# Patient Record
Sex: Male | Born: 1940 | Race: White | Hispanic: No | Marital: Married | Smoking: Former smoker
Health system: Southern US, Community
[De-identification: ages and names within clinical notes are randomized; demographics above are authoritative.]

## PROBLEM LIST (undated history)

## (undated) DIAGNOSIS — I499 Cardiac arrhythmia, unspecified: Secondary | ICD-10-CM

## (undated) DIAGNOSIS — R0609 Other forms of dyspnea: Secondary | ICD-10-CM

## (undated) DIAGNOSIS — J45909 Unspecified asthma, uncomplicated: Secondary | ICD-10-CM

## (undated) DIAGNOSIS — M1990 Unspecified osteoarthritis, unspecified site: Secondary | ICD-10-CM

## (undated) DIAGNOSIS — K922 Gastrointestinal hemorrhage, unspecified: Secondary | ICD-10-CM

## (undated) DIAGNOSIS — K760 Fatty (change of) liver, not elsewhere classified: Secondary | ICD-10-CM

## (undated) DIAGNOSIS — I10 Essential (primary) hypertension: Secondary | ICD-10-CM

## (undated) DIAGNOSIS — Z9289 Personal history of other medical treatment: Secondary | ICD-10-CM

## (undated) DIAGNOSIS — G4733 Obstructive sleep apnea (adult) (pediatric): Secondary | ICD-10-CM

## (undated) DIAGNOSIS — I4891 Unspecified atrial fibrillation: Secondary | ICD-10-CM

## (undated) DIAGNOSIS — R0600 Dyspnea, unspecified: Secondary | ICD-10-CM

## (undated) DIAGNOSIS — M109 Gout, unspecified: Secondary | ICD-10-CM

## (undated) DIAGNOSIS — E7800 Pure hypercholesterolemia, unspecified: Secondary | ICD-10-CM

## (undated) DIAGNOSIS — K219 Gastro-esophageal reflux disease without esophagitis: Secondary | ICD-10-CM

## (undated) DIAGNOSIS — I480 Paroxysmal atrial fibrillation: Secondary | ICD-10-CM

## (undated) DIAGNOSIS — R7303 Prediabetes: Secondary | ICD-10-CM

## (undated) DIAGNOSIS — C801 Malignant (primary) neoplasm, unspecified: Secondary | ICD-10-CM

## (undated) DIAGNOSIS — I509 Heart failure, unspecified: Secondary | ICD-10-CM

## (undated) HISTORY — PX: FINGER SURGERY: SHX640

## (undated) HISTORY — PX: EYE SURGERY: SHX253

## (undated) HISTORY — DX: Paroxysmal atrial fibrillation: I48.0

## (undated) HISTORY — DX: Other forms of dyspnea: R06.09

## (undated) HISTORY — DX: Obstructive sleep apnea (adult) (pediatric): G47.33

## (undated) HISTORY — DX: Unspecified atrial fibrillation: I48.91

## (undated) HISTORY — DX: Pure hypercholesterolemia, unspecified: E78.00

## (undated) HISTORY — PX: OTHER SURGICAL HISTORY: SHX169

## (undated) HISTORY — DX: Essential (primary) hypertension: I10

---

## 1999-02-08 ENCOUNTER — Other Ambulatory Visit: Admission: RE | Admit: 1999-02-08 | Discharge: 1999-02-08 | Payer: Self-pay | Admitting: Gastroenterology

## 1999-12-23 ENCOUNTER — Encounter: Admission: RE | Admit: 1999-12-23 | Discharge: 1999-12-23 | Payer: Self-pay | Admitting: *Deleted

## 1999-12-23 ENCOUNTER — Encounter: Payer: Self-pay | Admitting: *Deleted

## 2000-05-04 ENCOUNTER — Encounter: Admission: RE | Admit: 2000-05-04 | Discharge: 2000-05-04 | Payer: Self-pay | Admitting: Internal Medicine

## 2000-05-04 ENCOUNTER — Encounter: Payer: Self-pay | Admitting: Internal Medicine

## 2004-05-04 ENCOUNTER — Encounter (INDEPENDENT_AMBULATORY_CARE_PROVIDER_SITE_OTHER): Payer: Self-pay | Admitting: *Deleted

## 2004-05-04 ENCOUNTER — Ambulatory Visit (HOSPITAL_COMMUNITY): Admission: RE | Admit: 2004-05-04 | Discharge: 2004-05-04 | Payer: Self-pay | Admitting: Gastroenterology

## 2008-07-05 ENCOUNTER — Emergency Department (HOSPITAL_COMMUNITY): Admission: EM | Admit: 2008-07-05 | Discharge: 2008-07-05 | Payer: Self-pay | Admitting: Emergency Medicine

## 2008-09-09 ENCOUNTER — Inpatient Hospital Stay (HOSPITAL_COMMUNITY): Admission: RE | Admit: 2008-09-09 | Discharge: 2008-09-10 | Payer: Self-pay | Admitting: Urology

## 2008-09-09 ENCOUNTER — Encounter (INDEPENDENT_AMBULATORY_CARE_PROVIDER_SITE_OTHER): Payer: Self-pay | Admitting: Urology

## 2010-05-18 ENCOUNTER — Encounter: Admission: RE | Admit: 2010-05-18 | Discharge: 2010-05-18 | Payer: Self-pay | Admitting: Internal Medicine

## 2010-10-20 LAB — CBC
HCT: 42 % (ref 39.0–52.0)
HCT: 42.1 % (ref 39.0–52.0)
Hemoglobin: 14.3 g/dL (ref 13.0–17.0)
MCHC: 33.9 g/dL (ref 30.0–36.0)
MCHC: 33.9 g/dL (ref 30.0–36.0)
MCV: 91 fL (ref 78.0–100.0)
MCV: 91.5 fL (ref 78.0–100.0)
MCV: 91.7 fL (ref 78.0–100.0)
Platelets: 196 10*3/uL (ref 150–400)
Platelets: 213 10*3/uL (ref 150–400)
RDW: 13.1 % (ref 11.5–15.5)
WBC: 13 10*3/uL — ABNORMAL HIGH (ref 4.0–10.5)
WBC: 6.7 10*3/uL (ref 4.0–10.5)

## 2010-10-20 LAB — PROTIME-INR: INR: 1 (ref 0.00–1.49)

## 2010-10-20 LAB — DIFFERENTIAL
Basophils Absolute: 0 10*3/uL (ref 0.0–0.1)
Basophils Relative: 0 % (ref 0–1)
Basophils Relative: 0 % (ref 0–1)
Eosinophils Absolute: 0 10*3/uL (ref 0.0–0.7)
Eosinophils Absolute: 0 10*3/uL (ref 0.0–0.7)
Eosinophils Relative: 0 % (ref 0–5)
Lymphocytes Relative: 6 % — ABNORMAL LOW (ref 12–46)
Lymphs Abs: 0.5 10*3/uL — ABNORMAL LOW (ref 0.7–4.0)
Monocytes Absolute: 0.2 10*3/uL (ref 0.1–1.0)
Neutrophils Relative %: 88 % — ABNORMAL HIGH (ref 43–77)

## 2010-10-20 LAB — COMPREHENSIVE METABOLIC PANEL
Albumin: 3.6 g/dL (ref 3.5–5.2)
BUN: 17 mg/dL (ref 6–23)
Calcium: 9.2 mg/dL (ref 8.4–10.5)
Chloride: 109 mEq/L (ref 96–112)
Creatinine, Ser: 1.01 mg/dL (ref 0.4–1.5)
GFR calc non Af Amer: >60 mL/min (ref >60)
Total Bilirubin: 0.6 mg/dL (ref 0.3–1.2)

## 2010-10-20 LAB — BASIC METABOLIC PANEL
BUN: 12 mg/dL (ref 6–23)
CO2: 28 mEq/L (ref 19–32)
Chloride: 103 mEq/L (ref 96–112)
Chloride: 105 mEq/L (ref 96–112)
Creatinine, Ser: 0.93 mg/dL (ref 0.4–1.5)
Glucose, Bld: 156 mg/dL — ABNORMAL HIGH (ref 70–99)
Potassium: 4.6 mEq/L (ref 3.5–5.1)
Sodium: 139 mEq/L (ref 135–145)

## 2010-10-20 LAB — TYPE AND SCREEN: ABO/RH(D): O POS

## 2010-10-20 LAB — URINALYSIS, ROUTINE W REFLEX MICROSCOPIC
Bilirubin Urine: NEGATIVE
Glucose, UA: NEGATIVE mg/dL
Ketones, ur: NEGATIVE mg/dL
pH: 6 (ref 5.0–8.0)

## 2010-10-20 LAB — APTT: aPTT: 26 seconds (ref 24–37)

## 2010-11-22 NOTE — Op Note (Signed)
NAMEJAMAURIE, Darren [name]                ACCOUNT NO.:  [id number]   MEDICAL RECORD NO.:  [id number]          PATIENT TYPE:  INP   LOCATION:  0003                         FACILITY:  [facility]   PHYSICIAN:  Lucrezia Starch. Earlene [name], M.D.  DATE OF BIRTH:  10/24/1940   DATE OF PROCEDURE:  09/09/2008  DATE OF DISCHARGE:                               OPERATIVE REPORT   PREOPERATIVE DIAGNOSIS:  Adenocarcinoma of prostate.   OPERATIVE PROCEDURE:  Robotic-assisted laparoscopic radical  prostatectomy.   SURGEON:  Lucrezia Starch. Earlene [name], M.D.   ASSISTANT:  Delia Chimes, NP.   ANESTHESIA:  General endotracheal.   ESTIMATED BLOOD LOSS:  250 mL.   TUBES/:  A 20-French Foley catheter and large round Blake drain.   COMPLICATIONS:  None.   INDICATIONS FOR PROCEDURE:  Darren [name] is a very nice 70 year old white  male whose had an elevated PSA eventually to 9.21 with a free to total  ratio of 10.2%.  He subsequently underwent transrectal ultrasound of the  prostate and biopsy.  The biopsy revealed three cores of Gleason score  6, 3 + 3 adenocarcinoma from the right side and one from the left mid  side.  He has considered all options and after understanding risks,  benefits and alternatives he has elected to undergo the above procedure.   PROCEDURE IN DETAIL:  The patient was placed in supine position.  After  proper general endotracheal anesthesia he was placed in exaggerated  lithotomy position and prepped and draped with Betadine in a sterile  fashion.  A periumbilical incision was made at the proper level and  dissection was carried down to the peritoneal cavity.  A long 12 mm  camera port was placed and the abdomen was insufflated with carbon  dioxide.  Inspection of the abdomen revealed somewhat obese, but no  particular abnormalities were noted.  Under direct vision, the right and  left robotic arm ports were placed as was the fourth arm port in the  left lateral abdomen.  Also, under direct vision a 12 mm and a  5 mm  working port were placed on the right side of the abdomen.  The robot  was docked and dissection was began after the bladder had been  insufflated with 200 mL of normal saline.  The medial umbilical  ligaments were taken down.  The space of Retzius was entered.  The  bladder was dissected posteriorly.  The endopelvic fascia was taken down  bilaterally after the bladder been drained and the deep dorsal vein  complex was clipped and cut with the endovascular stapler.  The  puboprostatic ligaments were essentially intact.  Following this, the  bladder neck was approached after the prostate had been defatted.  An  amp of indigo carmine was given IV and the bladder neck was taken down  from the prostate.  There was a significant median bar present which we  had seen on ultrasound.  This was taken down posteriorly and included in  the specimen.  The seminal vesicles were dissected out in their  entirety.  The ampulla vas deferens were incised and  Denonvilliers  fascia was taken down to the apex of the prostate.  A bilateral nerve  spare was then performed under cold knife.  The right and left  neurovascular bundles were taken down posterior laterally and the  pedicles were taken in serial packets with Hem-o-lok clips and cut and  then dissection was carried down to the apex.  The apex was then taken  off of the urethra sharply.  The specimen was placed in the right lower  quadrant.  Good hemostasis noted to be present.  The rectum was totally  intact.  Thorough irrigation was performed.  Te bladder neck was  somewhat widened due to the median bar.  Therefore, a bladder neck  closure was performed with 3-0 Vicryl sutures placed on the right and  left side.  Again, indigo carmine was given IV and good blue dye was  noted to be effluxing.  The bladder neck was then approached,  approximated to the urethra under direct vision.  A holding stitch with  2-0 Vicryl suture was placed at the 6  o'clock position and the  anastomosis was accomplished with 3-0 Monocryl dyed and undyed sutures  tied posteriorly under the bladder.  Following this, a 20-French coude  catheter was placed in the bladder inflated and irrigated and there was  no leakage noted and blue dye was noted to be irrigating well.  Thorough  irrigation was performed.  Good hemostasis noted to be present.  We felt  that a node dissection was not indicated.  We had seen no obvious  lymphadenopathy with a low grade tumor.  We felt comfortable with that.  A large round Blake drain was placed through the fourth arm port and  sutured in place with nylon.  The specimen was removed from the  periumbilical port after the 12 mm right working port had been closed  under direct vision with  2-0 Vicryl suture and a suture passer.  All  ports were removed under direct vision.  The specimen was removed and  the fascia of the periumbilical port was closed under direct vision with  2-0 Vicryl suture.  Irrigation was performed.  Good hemostasis noted to  be present.  Each wound was injected 0.25% Marcaine and closed with skin  staples.  The patient tolerated the procedure well.  Specimen was  submitted to pathology.      Darren [name], M.D.  Electronically Signed     RLD/MEDQ  D:  09/09/2008  T:  09/09/2008  Job:  [id number]

## 2010-11-22 NOTE — Discharge Summary (Signed)
NAMEJAYE, POLIDORI                ACCOUNT NO.:  [id number]   MEDICAL RECORD NO.:  [id number]          PATIENT TYPE:  INP   LOCATION:  1432                         FACILITY:  [facility]   PHYSICIAN:  Lucrezia Starch. Earlene Plater, M.D.  DATE OF BIRTH:  1940-09-11   DATE OF ADMISSION:  09/09/2008  DATE OF DISCHARGE:  09/10/2008                               DISCHARGE SUMMARY   ADMISSION DIAGNOSIS:  Adenocarcinoma of the prostate.   DISCHARGE DIAGNOSIS:  Adenocarcinoma of the prostate.   PROCEDURES:  Robotic-assisted laparoscopic radical prostatectomy  (bilateral nerve sparing).   HISTORY AND PHYSICAL:  For full details, please see admission history  and physical.  Briefly, Mr. Holter monitor is a 70 year old gentleman  who was found to have clinically localized  adenocarcinoma of the  prostate.  After careful consideration regarding management options for  treatment, he elected to proceed with surgical therapy and a robotic-  assisted laparoscopic radial prostatectomy.   HOSPITAL COURSE:  On September 09, 2008, he was taken to the operating room  where he underwent the above-named procedure, which he tolerated well  without complications.  Postoperatively, he was able to be transferred  to a regular hospital room following recovery from anesthesia.  He was  able to begin ambulation that evening.  He remained hemodynamically  stable.  His postoperative hematocrit was 42.1.  He voiced no complaints  that evening, except for the fact that his right eye was red and swollen  and felt as if he had a foreign object.  That eye was inspected, and no  foreign objects were observed.  Therefore, an ophthalmology consultation  was called in.  Dr. Burgess Estelle, ophthalmologist, did observe a corneal  abrasion of the right eye and treated it with antibiotic drops and  ointment.   On the morning of postoperative day #1, his eye looked much improved  with less redness and less swelling.  He also denied any pain at that  time.  He hematocrit was also found to be stable on postoperative day #1  at 36.9.  He maintained a slight urine output with minimal output from  his pelvic drain; therefore, the pelvic drain was removed.  He was  placed on a clear liquid diet and continued to ambulate.  He was  reevaluated on the evening of postoperative day #1.  His urine output,  blood pressure and pulse all remained stable.  He was able to tolerate  his clear liquid diet.  Therefore, it was felt that he was stable for  discharge, as he had met all discharge criteria.   DISPOSITION:  Home.   DISCHARGE MEDICATIONS:  He was instructed to resume his regular home  medications consisting of:  1. Hyzaar.  2. Lipitor.  3. Omeprazole.  4. He was instructed to hold his aspirin for approximately 7-10 days      postoperatively.  5. In addition, he was provided a prescription for Vicodin to take as      needed for pain.  6. Told to use Colace as a stool softener.  7. He was also provided a prescription for an  antibiotic to use for 7      days until his postoperative appointment.   DISCHARGE INSTRUCTIONS:  He was instructed to be ambulatory, but  specifically told to refrain from any heavy lifting, strenuous activity  or driving.  He was instructed on routine Foley catheter care and told  to gradually advance his diet over the course of the next few days.   FOLLOWUP:  He will follow up in 1 week for a Foley catheter and staple  removal.      Delia Chimes, NP      Lucrezia Starch. Earlene Plater, M.D.  Electronically Signed    MA/MEDQ  D:  09/10/2008  T:  09/10/2008  Job:  [id number]

## 2010-11-25 NOTE — Op Note (Signed)
NAMEPOLK, Darren [name]                ACCOUNT NO.:  [id number]   MEDICAL RECORD NO.:  [id number]          PATIENT TYPE:  AMB   LOCATION:  ENDO                         FACILITY:  [facility]   PHYSICIAN:  Bernette Redbird, M.D.   DATE OF BIRTH:  1940-09-26   DATE OF PROCEDURE:  05/04/2004  DATE OF DISCHARGE:                                 OPERATIVE REPORT   PROCEDURE PERFORMED:  Colonoscopy with polypectomy and biopsy.   ENDOSCOPIST:  Florencia Reasons, M.D.   INDICATIONS FOR PROCEDURE:  The patient is a 70 year old gentleman with need  for colon cancer screening.  Family history of colon polyps in both parents.   FINDINGS:  Two small polyps removed.   DESCRIPTION OF PROCEDURE:  The nature, purpose and risks of the procedure  had been discussed with the patient, who provided written consent.  Sedation  was fentanyl 25 mcg and Versed 2.5 mg IV, intentionally very light sedation  at the patient's request so that he could be awake and watch the procedure.  Digital exam of the prostate was unremarkable.   The Olympus adult video colonoscope was readily advanced to the terminal  ileum and pullback was then performed.  The quality of the prep was very  good with a little bit of bile coating the proximal colon, much of which was  irrigated off, so it is felt that no significant lesions would been missed.   In the proximal colon, I saw a solitary diverticulum.  There was a small  sessile polyp in the transverse colon removed by cold biopsy technique and a  slightly larger 4 mm sessile polyp removed by hot snare technique in the  sigmoid region.  No large polyps, cancer, colitis, vascular malformations or  extensive diverticular change were noted.  Retroflexion in the rectum was  unremarkable as was reinspection of the rectum.  The patient tolerated the  procedure well and there were no apparent complications.   IMPRESSION:  1.  Colon polyps removed as described above (2113).  2.  Minimal right  side diverticulosis.  3.  Family history of colon polyps.   PLAN:  Follow-up colonoscopy in three to five years.       RB/MEDQ  D:  05/04/2004  T:  05/04/2004  Job:  [id number]   cc:   Wilson Singer, M.D.  [address], [address]  [place]  [place] [address]  Fax: [phone]

## 2011-04-14 LAB — URINE CULTURE: Colony Count: >=100000

## 2011-04-14 LAB — COMPREHENSIVE METABOLIC PANEL
ALT: 58 U/L — ABNORMAL HIGH (ref 0–53)
AST: 67 U/L — ABNORMAL HIGH (ref 0–37)
Alkaline Phosphatase: 108 U/L (ref 39–117)
CO2: 27 mEq/L (ref 19–32)
Chloride: 98 mEq/L (ref 96–112)
GFR calc Af Amer: >60 mL/min (ref >60)
GFR calc non Af Amer: 51 mL/min — ABNORMAL LOW (ref >60)
Potassium: 3.4 mEq/L — ABNORMAL LOW (ref 3.5–5.1)
Sodium: 135 mEq/L (ref 135–145)
Total Bilirubin: 0.8 mg/dL (ref 0.3–1.2)

## 2011-04-14 LAB — CBC
MCV: 92.3 fL (ref 78.0–100.0)
RBC: 4.8 MIL/uL (ref 4.22–5.81)
WBC: 11.6 10*3/uL — ABNORMAL HIGH (ref 4.0–10.5)

## 2011-04-14 LAB — URINALYSIS, ROUTINE W REFLEX MICROSCOPIC
Bilirubin Urine: NEGATIVE
Ketones, ur: NEGATIVE mg/dL
Nitrite: NEGATIVE
Urobilinogen, UA: 0.2 mg/dL (ref 0.0–1.0)
pH: 6 (ref 5.0–8.0)

## 2011-04-14 LAB — DIFFERENTIAL
Basophils Absolute: 0.1 10*3/uL (ref 0.0–0.1)
Eosinophils Absolute: 0.1 10*3/uL (ref 0.0–0.7)
Eosinophils Relative: 1 % (ref 0–5)

## 2011-04-14 LAB — URINE MICROSCOPIC-ADD ON

## 2011-04-14 LAB — SEDIMENTATION RATE: Sed Rate: 113 mm/hr — ABNORMAL HIGH (ref 0–16)

## 2011-08-16 ENCOUNTER — Other Ambulatory Visit: Payer: Self-pay | Admitting: Physician Assistant

## 2011-08-16 DIAGNOSIS — D229 Melanocytic nevi, unspecified: Secondary | ICD-10-CM

## 2011-08-16 HISTORY — DX: Melanocytic nevi, unspecified: D22.9

## 2011-09-14 ENCOUNTER — Other Ambulatory Visit: Payer: Self-pay | Admitting: Physician Assistant

## 2012-12-18 ENCOUNTER — Other Ambulatory Visit: Payer: Self-pay | Admitting: Physician Assistant

## 2013-08-04 ENCOUNTER — Ambulatory Visit
Admission: RE | Admit: 2013-08-04 | Discharge: 2013-08-04 | Disposition: A | Discharge status: Home / Self Care | Payer: Medicare Other | Source: Ambulatory Visit | Attending: Internal Medicine | Admitting: Internal Medicine

## 2013-08-04 ENCOUNTER — Other Ambulatory Visit: Payer: Self-pay | Admitting: Internal Medicine

## 2013-08-04 DIAGNOSIS — R05 Cough: Secondary | ICD-10-CM

## 2013-08-04 DIAGNOSIS — R059 Cough, unspecified: Secondary | ICD-10-CM

## 2015-07-15 ENCOUNTER — Other Ambulatory Visit: Payer: Self-pay | Admitting: Gastroenterology

## 2016-03-30 ENCOUNTER — Encounter: Payer: Self-pay | Admitting: Internal Medicine

## 2016-03-30 ENCOUNTER — Ambulatory Visit (INDEPENDENT_AMBULATORY_CARE_PROVIDER_SITE_OTHER)
Admission: RE | Admit: 2016-03-30 | Discharge: 2016-03-30 | Disposition: A | Discharge status: Home / Self Care | Payer: Medicare Other | Source: Ambulatory Visit | Attending: Internal Medicine | Admitting: Internal Medicine

## 2016-03-30 ENCOUNTER — Encounter (INDEPENDENT_AMBULATORY_CARE_PROVIDER_SITE_OTHER): Payer: Self-pay

## 2016-03-30 ENCOUNTER — Ambulatory Visit (INDEPENDENT_AMBULATORY_CARE_PROVIDER_SITE_OTHER): Payer: Medicare Other | Admitting: Internal Medicine

## 2016-03-30 VITALS — BP 162/80 | HR 73 | Ht 71.0 in | Wt 261.0 lb

## 2016-03-30 DIAGNOSIS — J45991 Cough variant asthma: Secondary | ICD-10-CM | POA: Diagnosis not present

## 2016-03-30 DIAGNOSIS — J449 Chronic obstructive pulmonary disease, unspecified: Secondary | ICD-10-CM | POA: Diagnosis not present

## 2016-03-30 DIAGNOSIS — E669 Obesity, unspecified: Secondary | ICD-10-CM | POA: Insufficient documentation

## 2016-03-30 LAB — NITRIC OXIDE: Nitric Oxide: 47

## 2016-03-30 MED ORDER — BUDESONIDE-FORMOTEROL FUMARATE 80-4.5 MCG/ACT IN AERO: 2.0000 | INHALATION_SPRAY | Freq: Two times a day (BID) | RESPIRATORY_TRACT | 11 refills | Status: DC

## 2016-03-30 MED ORDER — BUDESONIDE-FORMOTEROL FUMARATE 80-4.5 MCG/ACT IN AERO: 2.0000 | INHALATION_SPRAY | Freq: Two times a day (BID) | RESPIRATORY_TRACT | 0 refills | Status: DC

## 2016-03-30 NOTE — Patient Instructions (Signed)
Plan A = Automatic = Symbicort 80 Take 2 puffs first thing in am and then another 2 puffs about 12 hours later.   Plan B = Backup Only use your albuterol as a rescue medication to be used if you can't catch your breath by resting or doing a relaxed purse lip breathing pattern.  - The less you use it, the better it will work when you need it. - Ok to use the inhaler up to 2 puffs  every 4 hours if you must but call for appointment if use goes up over your usual need - Don't leave home without it !!  (think of it like the spare tire for your car)    Please remember to go to the  x-ray department downstairs for your tests - we will call you with the results when they are available.   Please schedule a follow up office visit in 6 weeks, call sooner if needed

## 2016-03-30 NOTE — Progress Notes (Signed)
   Subjective:    Patient ID: Darren [name], male    DOB: 1940/08/20,   MRN: [id number]  HPI  89 yowm quit smoking 1990 with cough that resolved and pattern of recurrent rhinitis since childhood spring > fall with onset of recurrent flares of cough / wheezing around 2005 resolves from days to weeks p prednisone cycle referred to pulmonary clinic 03/30/2016 by Dr   Dr Mellody Drown    03/30/2016 1st [facility] office visit/ Kissie Ziolkowski   Chief Complaint  Patient presents with  . Pulmonary Consult    Referred by Dr. [name]. Pt c/o cough and wheezing off and on "for years".    last prednisone was 15 days one week prior to OV  Back to baseline = doe x MMRC1 = can walk nl pace, flat grade, can't hurry or go uphills or steps s sob  On proair once each am   No obvious day to day or daytime variability or assoc excess/ purulent sputum or mucus plugs or hemoptysis or cp or chest tightness, subjective wheeze or overt sinus or hb symptoms. No unusual exp hx or h/o childhood pna/ asthma or knowledge of premature birth.  Sleeping ok without nocturnal  or early am exacerbation  of respiratory  c/o's or need for noct saba. Also denies any obvious fluctuation of symptoms with weather or environmental changes or other aggravating or alleviating factors except as outlined above   Current Medications, Allergies, Complete Past Medical History, Past Surgical History, Family History, and Social History were reviewed in [employer] record.         Review of Systems  Constitutional: Negative for activity change, appetite change, chills, fever and unexpected weight change.  HENT: Positive for sneezing. Negative for congestion, dental problem, postnasal drip, rhinorrhea, sore throat, trouble swallowing and voice change.   Eyes: Negative for visual disturbance.  Respiratory: Positive for cough. Negative for choking and shortness of breath.   Cardiovascular: Negative for chest pain and leg  swelling.  Gastrointestinal: Negative for abdominal pain, nausea and vomiting.  Genitourinary: Negative for difficulty urinating.  Musculoskeletal: Negative for arthralgias.  Skin: Negative for rash.  Psychiatric/Behavioral: Negative for behavioral problems and confusion.       Objective:   Physical Exam  amb obese wm nad  Wt Readings from Last 3 Encounters:  03/30/16 261 lb (118.4 kg)    Vital signs reviewed - sats 97% on arrival RA    HEENT: nl dentition, turbinates, and oropharynx. Nl external ear canals without cough reflex   NECK :  without JVD/Nodes/TM/ nl carotid upstrokes bilaterally   LUNGS: no acc muscle use,  Nl contour chest with distant mid to late exp wheezes bilaterally   CV:  RRR  no s3 or murmur or increase in P2, no edema   ABD:  Quite obese but soft and nontender with nl inspiratory excursion in the supine position. No bruits or organomegaly, bowel sounds nl  MS:  Nl gait/ ext warm without deformities, calf tenderness, cyanosis or clubbing No obvious joint restrictions   SKIN: warm and dry without lesions    NEURO:  alert, approp, nl sensorium with  no motor deficits     CXR PA and Lateral:   03/30/2016 :    I personally reviewed images and agree with radiology impression as follows:    Slight scarring left base. No edema or consolidation. Apparent nipple shadow right base, stable.      Assessment & Plan:

## 2016-03-30 NOTE — Assessment & Plan Note (Signed)
Quit smoking 1990 Spirometry 03/30/2016  FEV1 1.47 (45%)  Ratio 62    Informed the patient there was  no medication on the market that has proven to alter the curve/ its downward trajectory  or the likelihood of progression of their disease(unlike other chronic medical conditions such as atheroclerosis where we do think we can change the natural hx with risk reducing meds)    Therefore focus is on treating symtpoms and  and maintaining abstinence , preventing exacerbations >  These are most important aspect of care, not choice of inhalers, but that we would use more aggressive rx if needed to reduce the flares and in meantime encouraged hi m to be as active as possible and try to get wt heading in the right direction (see separate a/p)

## 2016-03-30 NOTE — Assessment & Plan Note (Signed)
.  Body mass index is 36.4 kg/m.  No results found for: TSH   Contributing to gerd tendency/ doe/reviewed the need and the process to achieve and maintain neg calorie balance > defer f/u primary care including intermittently monitoring thyroid status

## 2016-03-30 NOTE — Assessment & Plan Note (Signed)
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 and don't need big gun therapy for the copd at this point   Instead rec low dose symbicort ie 80 2 bid for now until returns to see if the pattern of cough can be interupted or whether the higher doses or alternative therapy should be considered eg allergy testing/ singulair trial  Total time devoted to counseling  = 35/58m review case with pt/ discussion of options/alternatives/ personally creating written instructions  in presence of pt  then going over those specific  Instructions directly with the pt including how to use all of the meds but in particular covering each new medication in detail and the difference between the maintenance/automatic meds and the prns using an action plan format for the latter.

## 2016-03-31 NOTE — Progress Notes (Signed)
LMTCB

## 2016-03-31 NOTE — Progress Notes (Signed)
Pt returned call Advised of cxr results as stated by MW

## 2016-04-07 ENCOUNTER — Telehealth: Payer: Self-pay | Admitting: Internal Medicine

## 2016-04-07 MED ORDER — FLUTICASONE-SALMETEROL 115-21 MCG/ACT IN AERO: 2.0000 | INHALATION_SPRAY | Freq: Two times a day (BID) | RESPIRATORY_TRACT | 12 refills | Status: DC

## 2016-04-07 NOTE — Telephone Encounter (Signed)
Called spoke with pt. Aware of the change. Rx sent in. Nothing further needed

## 2016-04-07 NOTE — Telephone Encounter (Signed)
Pt returning call.Darren [name] ° °

## 2016-04-07 NOTE — Telephone Encounter (Signed)
Received letter from pharm stating that Symbicort is not covered by ins  They prefer Advair and MW states okay to call in Advair HFA 115 2 puffs bid  Called to inform the pt and had to [facility]

## 2016-05-10 ENCOUNTER — Other Ambulatory Visit (INDEPENDENT_AMBULATORY_CARE_PROVIDER_SITE_OTHER): Payer: Medicare Other

## 2016-05-10 ENCOUNTER — Ambulatory Visit (INDEPENDENT_AMBULATORY_CARE_PROVIDER_SITE_OTHER): Payer: Medicare Other | Admitting: Internal Medicine

## 2016-05-10 ENCOUNTER — Encounter: Payer: Self-pay | Admitting: Internal Medicine

## 2016-05-10 VITALS — BP 134/82 | HR 80 | Ht 71.0 in | Wt 264.0 lb

## 2016-05-10 DIAGNOSIS — J449 Chronic obstructive pulmonary disease, unspecified: Secondary | ICD-10-CM

## 2016-05-10 DIAGNOSIS — J45991 Cough variant asthma: Secondary | ICD-10-CM | POA: Diagnosis not present

## 2016-05-10 LAB — CBC WITH DIFFERENTIAL/PLATELET
BASOS PCT: 0.8 % (ref 0.0–3.0)
Basophils Absolute: 0.1 10*3/uL (ref 0.0–0.1)
EOS PCT: 5.2 % — AB (ref 0.0–5.0)
Eosinophils Absolute: 0.4 10*3/uL (ref 0.0–0.7)
HCT: 40.9 % (ref 39.0–52.0)
Hemoglobin: 13.8 g/dL (ref 13.0–17.0)
LYMPHS ABS: 1.6 10*3/uL (ref 0.7–4.0)
Lymphocytes Relative: 22.6 % (ref 12.0–46.0)
MCHC: 33.9 g/dL (ref 30.0–36.0)
MCV: 95.9 fl (ref 78.0–100.0)
MONO ABS: 0.6 10*3/uL (ref 0.1–1.0)
Monocytes Relative: 8.6 % (ref 3.0–12.0)
NEUTROS PCT: 62.8 % (ref 43.0–77.0)
Neutro Abs: 4.4 10*3/uL (ref 1.4–7.7)
Platelets: 187 10*3/uL (ref 150.0–400.0)
RBC: 4.26 Mil/uL (ref 4.22–5.81)
RDW: 13.9 % (ref 11.5–15.5)
WBC: 6.9 10*3/uL (ref 4.0–10.5)

## 2016-05-10 MED ORDER — BUDESONIDE-FORMOTEROL FUMARATE 80-4.5 MCG/ACT IN AERO: 2.0000 | INHALATION_SPRAY | Freq: Two times a day (BID) | RESPIRATORY_TRACT | 1 refills | Status: DC

## 2016-05-10 MED ORDER — PREDNISONE 10 MG PO TABS: ORAL_TABLET | ORAL | 0 refills | Status: DC

## 2016-05-10 NOTE — Progress Notes (Signed)
Subjective:    Patient ID: ESIAH COFFELL, male    DOB: 12-16-1940,   MRN: [id number]    Brief patient profile:  24 yowm quit smoking 1990 with cough that resolved and pattern of recurrent rhinitis since childhood spring > fall with onset of recurrent flares of cough / wheezing around 2005 resolves from days to weeks p prednisone cycle referred to pulmonary clinic 03/30/2016 by Dr   Dr Mellody Drown    History of Present Illness  03/30/2016 1st [facility] office visit/ Casson Catena   Chief Complaint  Patient presents with  . Pulmonary Consult    Referred by Dr. [name]. Pt c/o cough and wheezing off and on "for years".    last prednisone was 15 days one week prior to OV  Back to baseline = doe x MMRC1 = can walk nl pace, flat grade, can't hurry or go uphills or steps s sob  On proair once each am  rec Plan A = Automatic = Symbicort 80 Take 2 puffs first thing in am and then another 2 puffs about 12 hours later.  Plan B = Backup Only use your albuterol as a rescue medication  Please remember to go to the  x-ray department downstairs for your tests - we will call you with the results when they are available.   04/07/16 insurance req change to advair 115    05/10/2016  f/u ov/Limuel Nieblas re: cough flare on advair 115 hfa 2 bid  Chief Complaint  Patient presents with  . Follow-up    Cough had improved but then worsened again 2 wks ago. He is no longer wheezing. Cough is rarely prod, but in the am's he coughs up brown sputum.    better for at least for a few weeks then gradually worse ? Due to change to advair ? Due to flare of fall rhinitis  Doe continues MMRC1  No obvious day to day or daytime variability or assoc   mucus plugs or hemoptysis or cp or chest tightness, subjective wheeze or overt sinus or hb symptoms. No unusual exp hx or h/o childhood pna/ asthma or knowledge of premature birth.  Sleeping ok without nocturnal  or early am exacerbation  of respiratory  c/o's or need for noct  saba. Also denies any obvious fluctuation of symptoms with weather or environmental changes or other aggravating or alleviating factors except as outlined above   Current Medications, Allergies, Complete Past Medical History, Past Surgical History, Family History, and Social History were reviewed in [employer] record.  ROS  The following are not active complaints unless bolded sore throat, dysphagia, dental problems, itching, sneezing,  nasal congestion or excess/ purulent secretions, ear ache,   fever, chills, sweats, unintended wt loss, classically pleuritic or exertional cp,  orthopnea pnd or leg swelling, presyncope, palpitations, abdominal pain, anorexia, nausea, vomiting, diarrhea  or change in bowel or bladder habits, change in stools or urine, dysuria,hematuria,  rash, arthralgias, visual complaints, headache, numbness, weakness or ataxia or problems with walking or coordination,  change in mood/affect or memory.             Objective:   Physical Exam  amb obese wm nad   Wt Readings from Last 3 Encounters:  05/10/16 264 lb (119.7 kg)  03/30/16 261 lb (118.4 kg)    Vital signs reviewed - Note on arrival 02 sats  96% on RA       HEENT: nl dentition, turbinates, and oropharynx. Nl external ear  canals without cough reflex   NECK :  without JVD/Nodes/TM/ nl carotid upstrokes bilaterally   LUNGS: no acc muscle use,  Nl contour chest with    CV:  RRR  no s3 or murmur or increase in P2, no edema   ABD:  Quite obese but soft and nontender with nl inspiratory excursion in the supine position. No bruits or organomegaly, bowel sounds nl  MS:  Nl gait/ ext warm without deformities, calf tenderness, cyanosis or clubbing No obvious joint restrictions   SKIN: warm and dry without lesions    NEURO:  alert, approp, nl sensorium with  no motor deficits     CXR PA and Lateral:   03/30/2016 :    I personally reviewed images and agree with radiology impression  as follows:    Slight scarring left base. No edema or consolidation. Apparent nipple shadow right base, stable.  Labs ordered 05/10/2016  Allergy profile       Assessment & Plan:

## 2016-05-10 NOTE — Patient Instructions (Addendum)
Stop advair  Plan A = Automatic = symbicort 80 Take 2 puffs first thing in am and then another 2 puffs about 12 hours later.    Work on [employer]  inhaler technique:  relax and gently blow all the way out then take a nice smooth deep breath back in, triggering the inhaler at same time you start breathing in.  Hold for up to 5 seconds if you can. Blow out thru nose. Rinse and gargle with water when done  Prednisone 10 mg take  4 each am x 2 days,   2 each am x 2 days,  1 each am x 2 days and stop      Plan B = Backup Only use your albuterol as a rescue medication to be used if you can't catch your breath by resting or doing a relaxed purse lip breathing pattern.  - The less you use it, the better it will work when you need it. - Ok to use the inhaler up to 2 puffs  every 4 hours if you must but call for appointment if use goes up over your usual need - Don't leave home without it !!  (think of it like the spare tire for your car)    Please remember to go to the lab department downstairs for your tests - we will call you with the results when they are available.     Please schedule a follow up office visit in 4 weeks, sooner if needed  Late add next step sinus CT / has 4 weeks symb samples to sort out whether sym 80 works vs advair 115

## 2016-05-11 ENCOUNTER — Ambulatory Visit: Payer: Medicare Other | Admitting: Internal Medicine

## 2016-05-11 LAB — RESPIRATORY ALLERGY PROFILE REGION II ~~LOC~~
ALLERGEN, CEDAR TREE, T6: 0.12 kU/L — AB
Allergen, C. Herbarum, M2: <0.1 kU/L
Allergen, Cottonwood, t14: <0.1 kU/L
Allergen, D pternoyssinus,d7: 0.12 kU/L — ABNORMAL HIGH
Allergen, Mulberry, t76: <0.1 kU/L
Allergen, Oak,t7: <0.1 kU/L
Bermuda Grass: 0.39 kU/L — ABNORMAL HIGH
Cat Dander: <0.1 kU/L
Cockroach: 0.3 kU/L — ABNORMAL HIGH
D. farinae: <0.1 kU/L
Dog Dander: <0.1 kU/L
Elm IgE: <0.1 kU/L
IGE (IMMUNOGLOBULIN E), SERUM: 60 kU/L (ref <115)
Johnson Grass: 0.41 kU/L — ABNORMAL HIGH
Pecan/Hickory Tree IgE: <0.1 kU/L
TIMOTHY GRASS: 2.93 kU/L — AB

## 2016-05-12 NOTE — Progress Notes (Signed)
lmtcb

## 2016-05-14 NOTE — Assessment & Plan Note (Signed)
Quit smoking 1990 Spirometry 03/30/2016  FEV1 1.47 (45%)  Ratio 62    Adequate control on present rx, reviewed > no change in rx needed  With MMRC1 and cough the main issue (see separate a/p)

## 2016-05-14 NOTE — Assessment & Plan Note (Signed)
FENO 03/30/2016  =   48 - 03/30/2016  After extensive coaching HFA effectiveness =    90% :  Try symbicort 80 2bid x 6 weeks then return with repeat spirometry >  04/06/2016 notified insurance prefers advair > try 115 hfa 2 bid  - 05/10/2016  After extensive coaching HFA effectiveness =    90%  Allergy profile 05/10/2016 >  Eos 0.4 /  IgE 60 Pos Grass/ cedar trees   - 05/10/2016 changed back to 4 weeks of symb 80 p flared on advair  DDX of  difficult airways management almost all start with A and  include Adherence, Ace Inhibitors, Acid Reflux, Active Sinus Disease, Alpha 1 Antitripsin deficiency, Anxiety masquerading as Airways dz,  ABPA,  Allergy(esp in young), Aspiration (esp in elderly), Adverse effects of meds,  Active smokers, A bunch of PE's (a small clot burden can't cause this syndrome unless there is already severe underlying pulm or vascular dz with poor reserve) plus two Bs  = Bronchiectasis and Beta blocker use..and one C= CHF   Adherence is always the initial "prime suspect" and is a multilayered concern that requires a "trust but verify" approach in every patient - starting with knowing how to use medications, especially inhalers, correctly, keeping up with refills and understanding the fundamental difference between maintenance and prns vs those medications only taken for a very short course and then stopped and not refilled.  - The proper method of use, as well as anticipated side effects, of a metered-dose inhaler are discussed and demonstrated to the patient. Improved effectiveness after extensive coaching during this visit to a level of approximately 75 % from a baseline of 50 % continue hfa since main problem is cough  ? Acid (or non-acid) GERD > always difficult to exclude as up to 75% of pts in some series report no assoc GI/ Heartburn symptoms> rec continue max (24h)  acid suppression and diet restrictions/ reviewed     ? Allergy > suggested by profile, consider trial of singulair  next   ? Adverse effects of meds > note much better on symb 80 than advair hfa despite improvement in doe so change back trial basis   ? A bunch of PE's > not likely on xarelto    I had an extended discussion with the patient reviewing all relevant studies completed to date and  lasting 15 to 20 minutes of a 25 minute visit on the following ongoing concerns:   Formulary restrictions will be an ongoing challenge for the forseable future and I would be happy to pick an alternative if the pt will first  provide me a list of them but pt  will need to return here for training for any new device that is required eg dpi vs hfa vs respimat.    In meantime we can always provide samples so the patient never runs out of any needed respiratory medications.   Each maintenance medication was reviewed in detail including most importantly the difference between maintenance and as needed and under what circumstances the prns are to be used.  Please see instructions for details which were reviewed in writing and the patient given a copy.

## 2016-05-14 NOTE — Assessment & Plan Note (Signed)
Body mass index is 36.82   No results found for: TSH   Contributing to gerd tendency/ doe/reviewed the need and the process to achieve and maintain neg calorie balance > defer f/u primary care including intermittently monitoring thyroid status

## 2016-05-15 ENCOUNTER — Telehealth: Payer: Self-pay | Admitting: Internal Medicine

## 2016-05-15 NOTE — Telephone Encounter (Signed)
Tanda Rockers, MD sent to Rosana Berger, CMA        Call patient : Studies are c/w mild allergies grass / cedar    Spoke with pt and notified of results per Dr. Melvyn Novas. Pt verbalized understanding and denied any questions.

## 2016-05-15 NOTE — Progress Notes (Signed)
Spoke with pt and notified of results per Dr. Wert. Pt verbalized understanding and denied any questions. 

## 2016-06-07 ENCOUNTER — Encounter: Payer: Self-pay | Admitting: Internal Medicine

## 2016-06-07 ENCOUNTER — Ambulatory Visit (INDEPENDENT_AMBULATORY_CARE_PROVIDER_SITE_OTHER): Payer: Medicare Other | Admitting: Internal Medicine

## 2016-06-07 VITALS — BP 130/74 | HR 71 | Ht 71.0 in | Wt 267.0 lb

## 2016-06-07 DIAGNOSIS — J45991 Cough variant asthma: Secondary | ICD-10-CM

## 2016-06-07 LAB — NITRIC OXIDE: NITRIC OXIDE: 10

## 2016-06-07 MED ORDER — BUDESONIDE-FORMOTEROL FUMARATE 80-4.5 MCG/ACT IN AERO: 2.0000 | INHALATION_SPRAY | Freq: Two times a day (BID) | RESPIRATORY_TRACT | 1 refills | Status: DC

## 2016-06-07 MED ORDER — MOMETASONE FURO-FORMOTEROL FUM 100-5 MCG/ACT IN AERO: INHALATION_SPRAY | RESPIRATORY_TRACT | 3 refills | Status: DC

## 2016-06-07 NOTE — Assessment & Plan Note (Signed)
Body mass index is 37.24 still trending up .  No results found for: TSH   Contributing to gerd tendency/ doe/reviewed the need and the process to achieve and maintain neg calorie balance > defer f/u primary care including intermittently monitoring thyroid status

## 2016-06-07 NOTE — Patient Instructions (Addendum)
Plan A = Automatic = symbicort 80 = dulera 100 Take 2 puffs first thing in am and then another 2 puffs about 12 hours later.    Plan B = Backup Only use your albuterol Proair) as a rescue medication to be used if you can't catch your breath by resting or doing a relaxed purse lip breathing pattern.  - The less you use it, the better it will work when you need it. - Ok to use the inhaler up to 2 puffs  every 4 hours if you must but call for appointment if use goes up over your usual need - Don't leave home without it !!  (think of it like the spare tire for your car)   Please schedule a follow up visit in 3 months but call sooner if needed

## 2016-06-07 NOTE — Progress Notes (Signed)
Subjective:    Patient ID: Darren [name], male    DOB: 08/25/1940,   MRN: [id number]    Brief patient profile:  76 yowm quit smoking 1990 with cough that resolved and pattern of recurrent rhinitis since childhood spring > fall with onset of recurrent flares of cough / wheezing around 2005 resolves from days to weeks p prednisone cycle needing up to 4 x on avg  referred to pulmonary clinic 03/30/2016 by Dr   Dr Mellody Drown    History of Present Illness  03/30/2016 1st [facility] office visit/ Darren [name]   Chief Complaint  Patient presents with  . Pulmonary Consult    Referred by Dr. [name]. Pt c/o cough and wheezing off and on "for years".    last prednisone was 15 days one week prior to OV  Back to baseline = doe x MMRC1 = can walk nl pace, flat grade, can't hurry or go uphills or steps s sob  On proair once each am  rec Plan A = Automatic = Symbicort 80 Take 2 puffs first thing in am and then another 2 puffs about 12 hours later.  Plan B = Backup Only use your albuterol as a rescue medication     04/07/16 insurance req change to advair 115   05/10/2016  f/u ov/Darren [name] re: cough flare on advair 115 hfa 2 bid  Chief Complaint  Patient presents with  . Follow-up    Cough had improved but then worsened again 2 wks ago. He is no longer wheezing. Cough is rarely prod, but in the am's he coughs up brown sputum.    better for at least for a few weeks then gradually worse ? Due to change to advair ? Due to flare of fall rhinitis Doe continues MMRC1 rec Stop advair Plan A = Automatic = symbicort 80 Take 2 puffs first thing in am and then another 2 puffs about 12 hours later.  Work on 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 Follow-up    Cough comes and goes. His cough is occ prod with clear sputum.      worked out at [facility] earlier today and overall much better breathing/ less cough than on advair  Main issue is cost/ insurance coverage for symbicort / never needs saba now    No obvious day to day or daytime variability or assoc   mucus plugs or hemoptysis or cp or chest tightness, subjective wheeze or overt sinus or hb symptoms. No unusual exp hx or h/o childhood pna/ asthma or knowledge of premature birth.  Sleeping ok without nocturnal  or early am exacerbation  of respiratory  c/o's or need for noct saba. Also denies any obvious fluctuation of symptoms with weather or environmental changes or other aggravating or alleviating factors except as outlined above   Current Medications, Allergies, Complete Past Medical History, Past Surgical History, Family History, and Social History were reviewed in [employer] record.  ROS  The following are not active complaints unless bolded sore throat,  dysphagia, dental problems, itching, sneezing,  nasal congestion or excess/ purulent secretions, ear ache,   fever, chills, sweats, unintended wt loss, classically pleuritic or exertional cp,  orthopnea pnd or leg swelling, presyncope, palpitations, abdominal pain, anorexia, nausea, vomiting, diarrhea  or change in bowel or bladder habits, change in stools or urine, dysuria,hematuria,  rash, arthralgias, visual complaints, headache, numbness, weakness or ataxia or problems with walking or coordination,  change in mood/affect or memory.             Objective:   Physical Exam  amb obese wm nad  Wt Readings from Last 3 Encounters:  06/07/16 267 lb (121.1 kg)  05/10/16 264 lb (119.7 kg)  03/30/16 261 lb (118.4 kg)      Vital signs reviewed - Note on arrival 02 sats  97% on RA       HEENT: nl dentition, turbinates, and oropharynx. Nl external  ear canals without cough reflex   NECK :  without JVD/Nodes/TM/ nl carotid upstrokes bilaterally   LUNGS: no acc muscle use,  Nl contour chest with    CV:  RRR  no s3 or murmur or increase in P2, no edema   ABD:  Quite obese but soft and nontender with nl inspiratory excursion in the supine position. No bruits or organomegaly, bowel sounds nl  MS:  Nl gait/ ext warm without deformities, calf tenderness, cyanosis or clubbing No obvious joint restrictions   SKIN: warm and dry without lesions    NEURO:  alert, approp, nl sensorium with  no motor deficits     CXR PA and Lateral:   03/30/2016 :    I personally reviewed images and agree with radiology impression as follows:    Slight scarring left base. No edema or consolidation. Apparent nipple shadow right base, stable.        Assessment & Plan:

## 2016-06-08 NOTE — Assessment & Plan Note (Signed)
FENO 03/30/2016  =   48 on just saba  - 03/30/2016  After extensive coaching HFA effectiveness =    90% :  Try symbicort 80 2bid x 6 weeks then return with repeat spirometry >  04/06/2016 notified insurance prefers advair > try 115 hfa 2 bid  - 05/10/2016  After extensive coaching HFA effectiveness =    90%  Allergy profile 05/10/2016 >  Eos 0.4 /  IgE 60 Pos Grass/ cedar trees   - 05/10/2016 changed back to 4 weeks of symb 80 p flared on advair - FENO 06/07/2016  =   10 on symb 80 2bid  - Spirometry 06/07/2016  FEV1 1.89 (58%)  Ratio  71     All goals of chronic asthma control met including optimal function, minimal FENO,  and elimination of symptoms with minimal need for rescue therapy.  Contingencies discussed in full including contacting this office immediately if not controlling the symptoms using the rule of two's.     Formulary restrictions will be an ongoing challenge for the forseable future and I would be happy to pick an alternative if the pt will first  provide me a list of them but pt  will need to return here for training for any new device that is required eg dpi vs hfa vs respimat.    In meantime we can always provide samples so the patient never runs out of any needed respiratory medications.

## 2016-07-10 DIAGNOSIS — K922 Gastrointestinal hemorrhage, unspecified: Secondary | ICD-10-CM

## 2016-07-10 DIAGNOSIS — Z9289 Personal history of other medical treatment: Secondary | ICD-10-CM

## 2016-07-10 HISTORY — DX: Personal history of other medical treatment: Z92.89

## 2016-07-10 HISTORY — DX: Gastrointestinal hemorrhage, unspecified: K92.2

## 2016-09-06 ENCOUNTER — Encounter: Payer: Self-pay | Admitting: Internal Medicine

## 2016-09-06 ENCOUNTER — Ambulatory Visit (INDEPENDENT_AMBULATORY_CARE_PROVIDER_SITE_OTHER): Payer: Medicare Other | Admitting: Internal Medicine

## 2016-09-06 VITALS — BP 118/82 | HR 71 | Ht 71.0 in | Wt 273.0 lb

## 2016-09-06 DIAGNOSIS — J45991 Cough variant asthma: Secondary | ICD-10-CM

## 2016-09-06 MED ORDER — FAMOTIDINE 20 MG PO TABS: ORAL_TABLET | ORAL | Status: DC

## 2016-09-06 MED ORDER — OMEPRAZOLE 40 MG PO CPDR: 40.0000 mg | DELAYED_RELEASE_CAPSULE | Freq: Every day | ORAL | 11 refills | Status: DC

## 2016-09-06 MED ORDER — MOMETASONE FURO-FORMOTEROL FUM 200-5 MCG/ACT IN AERO: 2.0000 | INHALATION_SPRAY | Freq: Two times a day (BID) | RESPIRATORY_TRACT | 11 refills | Status: DC

## 2016-09-06 NOTE — Progress Notes (Signed)
Subjective:    Patient ID: Darren [name], male    DOB: Nov 17, 1940,   MRN: [id number]    Brief patient profile:  48  yowm quit smoking 1990 with cough that resolved and pattern of recurrent rhinitis since childhood spring > fall with onset of recurrent flares of cough / wheezing around 2005 resolves from days to weeks p prednisone cycle needing up to 4 x on avg  referred to pulmonary clinic 03/30/2016 by Dr   Dr Mellody Drown    History of Present Illness  03/30/2016 1st Darren [name]   Chief Complaint  Patient presents with  . Pulmonary Consult    Referred by Dr. [name]. Pt c/o cough and wheezing off and on "for years".    last prednisone was 15 days one week prior to OV  Back to baseline = doe x MMRC1 = can walk nl pace, flat grade, can't hurry or go uphills or steps s sob  On proair once each am  rec Plan A = Automatic = Symbicort 80 Take 2 puffs first thing in am and then another 2 puffs about 12 hours later.  Plan B = Backup Only use your albuterol as a rescue medication     04/07/16 insurance req change to advair 115   05/10/2016  f/u ov/Darren [name] re: cough flare on advair 115 hfa 2 bid  Chief Complaint  Patient presents with  . Follow-up    Cough had improved but then worsened again 2 wks ago. He is no longer wheezing. Cough is rarely prod, but in the am's he coughs up brown sputum.    better for at least for a few weeks then gradually worse ? Due to change to advair ? Due to flare of fall rhinitis Doe continues MMRC1 rec Stop advair Plan A = Automatic = symbicort 80 Take 2 puffs first thing in am and then another 2 puffs about 12 hours later.  Work on [employer]  inhaler technique:   Prednisone 10 mg take  4 each am x 2 days,   2 each am x 2 days,  1 each am x 2 days and stop  Plan B = Backup Only use your albuterol as a rescue medication  4 weeks symb samples to sort out whether sym 80 works vs advair 115      06/07/2016  f/u ov/Darren [name] re: copd/ab with  ? uacs component  On dulera 100 2bid  Chief Complaint  Patient presents with  . Follow-up    Cough comes and goes. His cough is occ prod with clear sputum.    worked out at [facility] earlier today and overall much better breathing/ less cough than on advair  Main issue is cost/ insurance coverage for symbicort / never needs saba now  rec Plan A = Automatic = symbicort 80 = dulera 100 Take 2 puffs first thing in am and then another 2 puffs about 12 hours later.  Plan B = Backup Only use your albuterol Proair) as a rescue medication    09/06/2016  f/u ov/Darren [name] re:  AB/ dulera 100 2bid and prilosec ac only / no saba  Chief Complaint  Patient presents with  . Follow-up    3 month follow up. Breathing has been ok since last visit. Cough has improved since last visit. Denies SOB or chest pain.   cough is some better but  the only time completely free of "congestion" sensation  Is while  on prednisone but never produces  purulent secretions Nose drips after shower>> only  watery  prilosec 20 mg ac  Never wakes him up even when cough flares/ cpap works fine    No obvious day to day or daytime variability or assoc   mucus plugs or hemoptysis or cp or chest tightness, subjective wheeze or overt sinus or hb symptoms. No unusual exp hx or h/o childhood pna/ asthma or knowledge of premature birth.  Sleeping ok on cpap without nocturnal  or early am exacerbation  of respiratory  c/o's or need for noct saba. Also denies any obvious fluctuation of symptoms with weather or environmental changes or other aggravating or alleviating factors except as outlined above   Current Medications, Allergies, Complete Past Medical History, Past Surgical History, Family History, and Social History were reviewed in [employer] record.  ROS  The following are not active complaints unless bolded sore throat, dysphagia, dental problems, itching, sneezing,  nasal congestion or excess/ purulent secretions, ear  ache,   fever, chills, sweats, unintended wt loss, classically pleuritic or exertional cp,  orthopnea pnd or leg swelling, presyncope, palpitations, abdominal pain, anorexia, nausea, vomiting, diarrhea  or change in bowel or bladder habits, change in stools or urine, dysuria,hematuria,  rash, arthralgias, visual complaints, headache, numbness, weakness or ataxia or problems with walking or coordination,  change in mood/affect or memory.             Objective:   Physical Exam  amb obese wm nad   09/06/16         273   06/07/16 267 lb (121.1 kg)  05/10/16 264 lb (119.7 kg)  03/30/16 261 lb (118.4 kg)      Vital signs reviewed -  - Note on arrival 02 sats  96% on RA       HEENT: nl dentition, turbinates, and oropharynx. Nl external ear canals without cough reflex Modified Mallampati Score =   4   NECK :  without JVD/Nodes/TM/ nl carotid upstrokes bilaterally   LUNGS: no acc muscle use,  Nl contour chest with    CV:  RRR  no s3 or murmur or increase in P2, no edema   ABD:  Quite obese but soft and nontender with nl inspiratory excursion in the supine position. No bruits or organomegaly, bowel sounds nl  MS:  Nl gait/ ext warm without deformities, calf tenderness, cyanosis or clubbing No obvious joint restrictions   SKIN: warm and dry without lesions    NEURO:  alert, approp, nl sensorium with  no motor deficits            Assessment & Plan:

## 2016-09-06 NOTE — Patient Instructions (Addendum)
Plan A = Automatic = dulera 200 Take 2 puffs first thing in am and then another 2 puffs about 12 hours later to see what difference it makes  Also increase prilosec to 40 mg Take 30-60 min before first meal of the day and pepcid 20 mg at bedtime   Plan B = Backup Only use your albuterol as a rescue medication to be used if you can't catch your breath by resting or doing a relaxed purse lip breathing pattern.  - The less you use it, the better it will work when you need it. - Ok to use the inhaler up to 2 puffs  every 4 hours if you must but call for appointment if use goes up over your usual need - Don't leave home without it !!  (think of it like the spare tire for your car)    GERD (REFLUX)  is an extremely common cause of respiratory symptoms just like yours , many times with no obvious heartburn at all.    It can be treated with medication, but also with lifestyle changes including elevation of the head of your bed (ideally with 6 inch  bed blocks),  Smoking cessation, avoidance of late meals, excessive alcohol, and avoid fatty foods, chocolate, peppermint, colas, red wine, and acidic juices such as orange juice.  NO MINT OR MENTHOL PRODUCTS SO NO COUGH DROPS   USE SUGARLESS CANDY INSTEAD (Jolley ranchers or Stover's or Life Savers) or even ice chips will also do - the key is to swallow to prevent all throat clearing. NO OIL BASED VITAMINS - use powdered substitutes.    Please schedule a follow up visit in 3 months but call sooner if needed

## 2016-09-07 NOTE — Assessment & Plan Note (Signed)
FENO 03/30/2016  =   48 on just saba  - 03/30/2016  After extensive coaching HFA effectiveness =    90% :  Try symbicort 80 2bid x 6 weeks then return with repeat spirometry >  04/06/2016 notified insurance prefers advair > try 115 hfa 2 bid   - Allergy profile 05/10/2016 >  Eos 0.4 /  IgE 60 Pos Grass/ cedar trees   - 05/10/2016 changed back to 4 weeks of symb 80 p flared on advair - FENO 06/07/2016  =   10 on symb 80 2bid  - Spirometry 06/07/2016  FEV1 1.89 (58%)  Ratio  71   - 09/06/2016  After extensive coaching HFA effectiveness =    90% try dulera 200 2bid  He is overall better but still bothered by vague/ non-specific sense of daytime "congestion" that only improves on while on pred so worth trying dulera in the higher strength x 2 weeks   I had an extended discussion with the patient reviewing all relevant studies completed to date and  lasting 15 to 20 minutes of a 25 minute visit    Each maintenance medication was reviewed in detail including most importantly the difference between maintenance and prns and under what circumstances the prns are to be triggered using an action plan format that is not reflected in the computer generated alphabetically organized AVS.    Please see AVS for specific instructions unique to this visit that I personally wrote and verbalized to the the pt in detail and then reviewed with pt  by my nurse highlighting any  changes in therapy recommended at today's visit to their plan of care.

## 2016-09-07 NOTE — Assessment & Plan Note (Signed)
Body mass index is 38.08 kg/m.  trending up No results found for: TSH   Contributing to gerd risk/ doe/reviewed the need and the process to achieve and maintain neg calorie balance > defer f/u primary care including intermittently monitoring thyroid status

## 2016-11-28 ENCOUNTER — Encounter: Payer: Self-pay | Admitting: Internal Medicine

## 2016-11-28 ENCOUNTER — Ambulatory Visit (INDEPENDENT_AMBULATORY_CARE_PROVIDER_SITE_OTHER): Payer: Medicare Other | Admitting: Internal Medicine

## 2016-11-28 VITALS — BP 140/80 | HR 75 | Ht 71.0 in | Wt 266.6 lb

## 2016-11-28 DIAGNOSIS — J45991 Cough variant asthma: Secondary | ICD-10-CM

## 2016-11-28 NOTE — Assessment & Plan Note (Addendum)
FENO 03/30/2016  =   48 on just saba  - 03/30/2016  After extensive coaching HFA effectiveness =    90% :  Try symbicort 80 2bid x 6 weeks then return with repeat spirometry >  04/06/2016 notified insurance prefers advair > try 115 hfa 2 bid   - Allergy profile 05/10/2016 >  Eos 0.4 /  IgE 60 Pos Grass/ cedar trees   - 05/10/2016 changed back to 4 weeks of symb 80 p flared on advair - FENO 06/07/2016  =   10 on symb 80 2bid  - Spirometry 06/07/2016  FEV1 1.89 (58%)  Ratio  71   - 09/06/2016  After extensive coaching HFA effectiveness =    90% try dulera 200 2bid  - 11/28/2016 excess shaking on duelra 200 2bid so try one bid and off gerd rx since is not convinced worked - Sinus CT 11/28/2016 >>>    Not clear whether cough is upper or lower airway or both at this point  The standardized cough guidelines published in Chest by Lissa Morales in 2006 are still the best available and consist of a multiple step process (up to 12!) , not a single office visit,  and are intended  to address this problem logically,  with an alogrithm dependent on response to empiric treatment at  each progressive step  to determine a specific diagnosis with  minimal addtional testing needed. Therefore if adherence is an issue or can't be accurately verified,  it's very unlikely the standard evaluation and treatment will be successful here.    Furthermore, response to therapy (other than acute cough suppression, which should only be used short term with avoidance of narcotic containing cough syrups if possible), can be a gradual process for which the patient is not likely to  perceive immediate benefit.  Unlike going to an eye doctor where the best perscription is almost always the first one and is immediately effective, this is almost never the case in the management of chronic cough syndromes. Therefore the patient needs to commit up front to consistently adhere to recommendations  for up to 6 weeks of therapy directed at the likely  underlying problem(s) before the response can be reasonably evaluated.   Will see back in 6 weeks to regroup on the lower dose of dulera and obtain sinus ct in meantime and add zyrtec 10 mg qhs prn pnds   I had an extended discussion with the patient reviewing all relevant studies completed to date and  lasting 15 to 20 minutes of a 25 minute visit    Each maintenance medication was reviewed in detail including most importantly the difference between maintenance and prns and under what circumstances the prns are to be triggered using an action plan format that is not reflected in the computer generated alphabetically organized AVS.    Please see AVS for specific instructions unique to this visit that I personally wrote and verbalized to the the pt in detail and then reviewed with pt  by my nurse highlighting any  changes in therapy recommended at today's visit to their plan of care.

## 2016-11-28 NOTE — Progress Notes (Signed)
Subjective:   Patient ID: Darren [name], male    DOB: 09/28/1940,   MRN: [id number]    Brief patient profile:  27  yowm quit smoking 1990 with cough that resolved and pattern of recurrent rhinitis since childhood spring > fall with onset of recurrent flares of cough / wheezing around 2005 resolves from days to weeks p prednisone cycle needing up to 4 x on avg  referred to pulmonary clinic 03/30/2016 by Dr   Dr Mellody Drown    History of Present Illness  03/30/2016 1st [facility] office visit/ Darren [name]   Chief Complaint  Patient presents with  . Pulmonary Consult    Referred by Dr. [name]. Pt c/o cough and wheezing off and on "for years".    last prednisone was 15 days one week prior to OV  Back to baseline = doe x MMRC1 = can walk nl pace, flat grade, can't hurry or go uphills or steps s sob  On proair once each am  rec Plan A = Automatic = Symbicort 80 Take 2 puffs first thing in am and then another 2 puffs about 12 hours later.  Plan B = Backup Only use your albuterol as a rescue medication     04/07/16 insurance req change to advair 115   05/10/2016  f/u ov/Darren [name] re: cough flare on advair 115 hfa 2 bid  Chief Complaint  Patient presents with  . Follow-up    Cough had improved but then worsened again 2 wks ago. He is no longer wheezing. Cough is rarely prod, but in the am's he coughs up brown sputum.    better for at least for a few weeks then gradually worse ? Due to change to advair ? Due to flare of fall rhinitis Doe continues MMRC1 rec Stop advair Plan A = Automatic = symbicort 80 Take 2 puffs first thing in am and then another 2 puffs about 12 hours later.  Work on 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 Follow-up    Cough comes and goes. His cough is occ prod with clear sputum.    worked out at y earlier today and overall much better breathing/ less cough than on advair  Main issue is cost/ insurance coverage for symbicort / never needs saba now  rec Plan A = Automatic = symbicort 80 = dulera 100 Take 2 puffs first thing in am and then another 2 puffs about 12 hours later.  Plan B = Backup Only use your albuterol Proair) as a rescue medication    09/06/2016  f/u ov/Darren [name] re:  AB/ dulera 100 2bid and prilosec ac only / no saba  Chief Complaint  Patient presents with  . Follow-up    3 month follow up. Breathing has been ok since last visit. Cough has improved since last visit. Denies SOB or chest pain.   cough is some better but  the only time completely free of "congestion" sensation  Is while  on prednisone but never produces purulent  secretions Nose drips after shower>> only  watery  prilosec 20 mg ac  Never wakes him up even when cough flares/ cpap works fine  rec Plan A = Automatic = dulera 200 Take 2 puffs first thing in am and then another 2 puffs about 12 hours later to see what difference it makes Also increase prilosec to 40 mg Take 30-60 min before first meal of the day and pepcid 20 mg at bedtime  Plan B = Backup Only use your albuterol as a rescue medication   GERD diet     11/28/2016  f/u ov/Darren [name] re: AB/ rhinitis chronic cough no better on gerd rx / shaky on dulera 200 2bid Chief Complaint  Patient presents with  . Follow-up    Cough has been worse x 2 wks and is occ prod with clear sputum. He has not had to use albuterol.   no change on gerd rx/ breathing overall better  Sense of pnds not using any otcs  No obvious day to day or daytime variability or assoc  purulent sputum or mucus plugs or hemoptysis or cp or chest tightness, subjective wheeze or overt sinus or hb symptoms. No unusual  exp hx or h/o childhood pna/ asthma or knowledge of premature birth.  Sleeping ok on cpap without nocturnal  or early am exacerbation  of respiratory  c/o's or need for noct saba. Also denies any obvious fluctuation of symptoms with weather or environmental changes or other aggravating or alleviating factors except as outlined above   Current Medications, Allergies, Complete Past Medical History, Past Surgical History, Family History, and Social History were reviewed in [employer] record.  ROS  The following are not active complaints unless bolded sore throat, dysphagia, dental problems, itching, sneezing,  nasal congestion or excess/ purulent secretions, ear ache,   fever, chills, sweats, unintended wt loss, classically pleuritic or exertional cp,  orthopnea pnd or leg swelling, presyncope, palpitations, abdominal pain, anorexia, nausea, vomiting, diarrhea  or change in bowel or bladder habits, change in stools or urine, dysuria,hematuria,  rash, arthralgias, visual complaints, headache, numbness, weakness or ataxia or problems with walking or coordination,  change in mood/affect or memory.                      Objective:   Physical Exam  amb obese wm nad   11/28/2016        267  09/06/16         273   06/07/16 267 lb (121.1 kg)  05/10/16 264 lb (119.7 kg)  03/30/16 261 lb (118.4 kg)      Vital signs reviewed -  - Note on arrival 02 sats  97% on RA       HEENT: nl dentition,  and oropharynx. Nl external ear canals without cough reflex -  moderate bilateral non-specific turbinate edema   - Modified Mallampati Score =   3/4   NECK :  without JVD/Nodes/TM/ nl carotid upstrokes bilaterally   LUNGS: no acc muscle use,  Nl contour chest with  Mild insp and exp rhonchi bilaterally s cough on insp / exp   CV:  RRR  no s3 or murmur or increase in P2, no edema   ABD:  Quite obese but soft and nontender with nl inspiratory excursion in the supine position. No  bruits or organomegaly, bowel sounds nl  MS:  Nl gait/ ext warm without deformities, calf tenderness, cyanosis or clubbing No obvious joint restrictions  SKIN: warm and dry without lesions    NEURO:  alert, approp, nl sensorium with  no motor deficits/ minimal resting tremor both hands             Assessment & Plan:

## 2016-11-28 NOTE — Patient Instructions (Addendum)
Ok to try dulera 200 one twice daily   Ok to try off the reflux meds  For now but continue diet and start back on the acid suppression if cough flares   If worse > Prednisone 10 mg take  4 each am x 2 days,   2 each am x 2 days,  1 each am x 2 days and stop   For nasal drainage > zyrtec 10 mg at bedtime as needed  Please see patient coordinator before you leave today  to schedule sinus CT      Please schedule a follow up office visit in 6 weeks, call sooner if needed

## 2016-11-28 NOTE — Assessment & Plan Note (Signed)
Body mass index is 37.18 kg/m.  -  Trending down/ encouraged  No results found for: TSH   Contributing to gerd risk/ doe/reviewed the need and the process to achieve and maintain neg calorie balance > defer f/u primary care including intermittently monitoring thyroid status

## 2016-11-29 ENCOUNTER — Ambulatory Visit (INDEPENDENT_AMBULATORY_CARE_PROVIDER_SITE_OTHER)
Admission: RE | Admit: 2016-11-29 | Discharge: 2016-11-29 | Disposition: A | Discharge status: Home / Self Care | Payer: Medicare Other | Source: Ambulatory Visit | Attending: Internal Medicine | Admitting: Internal Medicine

## 2016-11-29 ENCOUNTER — Telehealth: Payer: Self-pay | Admitting: Internal Medicine

## 2016-11-29 DIAGNOSIS — J45991 Cough variant asthma: Secondary | ICD-10-CM | POA: Diagnosis not present

## 2016-11-29 NOTE — Progress Notes (Signed)
LMTCB

## 2016-11-29 NOTE — Telephone Encounter (Signed)
Notes recorded by Tanda Rockers, MD on 11/29/2016 at 11:13 AM EDT Call patient : Study is unremarkable, no change in recs (ct sinus) --------------- Spoke with pt, aware of results/recs.  Nothing further needed.

## 2017-01-08 ENCOUNTER — Other Ambulatory Visit: Payer: Self-pay | Admitting: Internal Medicine

## 2017-01-08 DIAGNOSIS — R945 Abnormal results of liver function studies: Secondary | ICD-10-CM

## 2017-01-08 DIAGNOSIS — R7989 Other specified abnormal findings of blood chemistry: Secondary | ICD-10-CM

## 2017-01-09 ENCOUNTER — Encounter: Payer: Self-pay | Admitting: Internal Medicine

## 2017-01-09 ENCOUNTER — Ambulatory Visit (INDEPENDENT_AMBULATORY_CARE_PROVIDER_SITE_OTHER): Payer: Medicare Other | Admitting: Internal Medicine

## 2017-01-09 VITALS — BP 130/70 | HR 71 | Ht 71.0 in | Wt 263.4 lb

## 2017-01-09 DIAGNOSIS — J449 Chronic obstructive pulmonary disease, unspecified: Secondary | ICD-10-CM

## 2017-01-09 DIAGNOSIS — J45991 Cough variant asthma: Secondary | ICD-10-CM

## 2017-01-09 MED ORDER — MONTELUKAST SODIUM 10 MG PO TABS: 10.0000 mg | ORAL_TABLET | Freq: Every day | ORAL | 11 refills | Status: DC

## 2017-01-09 MED ORDER — MOMETASONE FURO-FORMOTEROL FUM 200-5 MCG/ACT IN AERO: 2.0000 | INHALATION_SPRAY | Freq: Every day | RESPIRATORY_TRACT | 0 refills | Status: DC

## 2017-01-09 NOTE — Progress Notes (Signed)
Subjective:   Patient ID: Darren [name], male    DOB: 09/28/1940,   MRN: [id number]    Brief patient profile:  27  yowm quit smoking 1990 with cough that resolved and pattern of recurrent rhinitis since childhood spring > fall with onset of recurrent flares of cough / wheezing around 2005 resolves from days to weeks p prednisone cycle needing up to 4 x on avg  referred to pulmonary clinic 03/30/2016 by Dr   Dr Mellody Drown    History of Present Illness  03/30/2016 1st [facility] office visit/ Darren [name]   Chief Complaint  Patient presents with  . Pulmonary Consult    Referred by Dr. [name]. Pt c/o cough and wheezing off and on "for years".    last prednisone was 15 days one week prior to OV  Back to baseline = doe x MMRC1 = can walk nl pace, flat grade, can't hurry or go uphills or steps s sob  On proair once each am  rec Plan A = Automatic = Symbicort 80 Take 2 puffs first thing in am and then another 2 puffs about 12 hours later.  Plan B = Backup Only use your albuterol as a rescue medication     04/07/16 insurance req change to advair 115   05/10/2016  f/u ov/Darren [name] re: cough flare on advair 115 hfa 2 bid  Chief Complaint  Patient presents with  . Follow-up    Cough had improved but then worsened again 2 wks ago. He is no longer wheezing. Cough is rarely prod, but in the am's he coughs up brown sputum.    better for at least for a few weeks then gradually worse ? Due to change to advair ? Due to flare of fall rhinitis Doe continues MMRC1 rec Stop advair Plan A = Automatic = symbicort 80 Take 2 puffs first thing in am and then another 2 puffs about 12 hours later.  Work on 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 Follow-up    Cough comes and goes. His cough is occ prod with clear sputum.    worked out at y earlier today and overall much better breathing/ less cough than on advair  Main issue is cost/ insurance coverage for symbicort / never needs saba now  rec Plan A = Automatic = symbicort 80 = dulera 100 Take 2 puffs first thing in am and then another 2 puffs about 12 hours later.  Plan B = Backup Only use your albuterol Proair) as a rescue medication    09/06/2016  f/u ov/Darren [name] re:  AB/ dulera 100 2bid and prilosec ac only / no saba  Chief Complaint  Patient presents with  . Follow-up    3 month follow up. Breathing has been ok since last visit. Cough has improved since last visit. Denies SOB or chest pain.   cough is some better but  the only time completely free of "congestion" sensation  Is while  on prednisone but never produces purulent  secretions Nose drips after shower>> only  watery  prilosec 20 mg ac  Never wakes him up even when cough flares/ cpap works fine  rec Plan A = Automatic = dulera 200 Take 2 puffs first thing in am and then another 2 puffs about 12 hours later to see what difference it makes Also increase prilosec to 40 mg Take 30-60 min before first meal of the day and pepcid 20 mg at bedtime  Plan B = Backup Only use your albuterol as a rescue medication   GERD diet     11/28/2016  f/u ov/Darren [name] re: AB/ rhinitis chronic cough no better on gerd rx / shaky on dulera 200 2bid Chief Complaint  Patient presents with  . Follow-up    Cough has been worse x 2 wks and is occ prod with clear sputum. He has not had to use albuterol.   no change on gerd rx/ breathing overall better  Sense of pnds not using any otcs rec Ok to try dulera 200 one twice daily  Ok to try off the reflux meds  For now but continue diet and start back on the acid suppression if cough flares  If worse > Prednisone 10 mg take   4 each am x 2 days,   2 each am x 2 days,  1 each am x 2 days and stop  For nasal drainage > zyrtec 10 mg at bedtime as needed Please see patient coordinator before you leave today  to schedule sinus CT     01/09/2017  f/u ov/Darren [name] re:  dulera 200 one bid and prilosec 20 mg daily/ did not follow any of the contingency recs  Chief Complaint  Patient presents with  . Follow-up    Pt states that he is here to get work on his wheezing and coughing. Pt went on vacation and stated when got out of [place] heading toward [place], the coughing became better. Once arrived back in [place], cough and wheezing became worse again. Denies chest pain. Pt is getting ready to begin a new neuropathy treatment.   not using albuterol at all as only using for breathing  Doing fine on cpap s cough / sob  Doe = MMRC2 = can't walk a nl pace on a flat grade s sob but does fine slow and flat   No obvious other patterns in  day to day or daytime variability or assoc excess/ purulent sputum or mucus plugs or hemoptysis or cp or chest tightness,  overt sinus or hb symptoms. No unusual exp hx or h/o childhood pna/ asthma or knowledge of premature birth.  Sleeping ok without nocturnal  or early am exacerbation  of respiratory  c/o's or need for noct saba. Also denies any obvious fluctuation of symptoms with weather or environmental changes or other aggravating or alleviating factors except as outlined above   Current Medications, Allergies, Complete Past Medical History, Past Surgical History, Family History, and Social History were reviewed in [employer] record.  ROS  The following are not active complaints unless bolded sore throat, dysphagia, dental problems, itching, sneezing,  nasal congestion or excess/ purulent secretions, ear ache,   fever, chills, sweats, unintended wt loss, classically pleuritic or exertional cp,  orthopnea pnd or leg swelling, presyncope, palpitations, abdominal pain, anorexia, nausea,  vomiting, diarrhea  or change in bowel or bladder habits, change in stools or urine, dysuria,hematuria,  rash, arthralgias, visual complaints, headache, numbness, weakness or ataxia or problems with walking or coordination,  change in mood/affect or memory.                            Objective:   Physical Exam  amb obese wm nad with very vigorous throat clearing   01/09/2017          263  11/28/2016        267  09/06/16         273   06/07/16 267 lb (121.1 kg)  05/10/16 264 lb (119.7 kg)  03/30/16 261 lb (118.4 kg)      Vital signs reviewed -  - Note on arrival 02 sats  96% on RA       HEENT: nl dentition,  and oropharynx. Nl external ear canals without cough reflex -  moderate bilateral non-specific turbinate edema   - Modified Mallampati Score =   3/4   NECK :  without JVD/Nodes/TM/ nl carotid upstrokes bilaterally   LUNGS: no acc muscle use,  Nl contour chest with  Very min insp and exp rhonchi bilaterally s cough on insp / exp   CV:  RRR  no s3 or murmur or increase in P2, no edema   ABD:  Quite obese but soft and nontender with nl inspiratory excursion in the supine position. No bruits or organomegaly, bowel sounds nl  MS:  Nl gait/ ext warm without deformities, calf tenderness, cyanosis or clubbing No obvious joint restrictions   SKIN: warm and dry without lesions    NEURO:  alert, approp, nl sensorium with  no motor deficits/ minimal resting tremor both hands             Assessment & Plan:

## 2017-01-09 NOTE — Patient Instructions (Addendum)
First try dulera 200  Take 2 puffs first thing in am and then another 2 puffs about 12 hours later.   If not better add singulair 10 mg daily after 1 week of the higher dose dulera   Whenever cough is out of control go ahead and use prilosec Take 30- 60 min before your first and last meals of the day until cough is better   For drainage / throat tickle try take CHLORPHENIRAMINE  4 mg - take one every 4 hours as needed - available over the counter- may cause drowsiness so start with just a bedtime dose or two and see how you tolerate it before trying in daytime  (if too drowsy ok to use zyrtec)   If all fails > take Prednisone 10 mg take  4 each am x 2 days,   2 each am x 2 days,  1 each am x 2 days and stop   GERD (REFLUX)  is an extremely common cause of respiratory symptoms just like yours , many times with no obvious heartburn at all.    It can be treated with medication, but also with lifestyle changes including elevation of the head of your bed (ideally with 6 inch  bed blocks),  Smoking cessation, avoidance of late meals, excessive alcohol, and avoid fatty foods, chocolate, peppermint, colas, red wine, and acidic juices such as orange juice.  NO MINT OR MENTHOL PRODUCTS SO NO COUGH DROPS  USE SUGARLESS CANDY INSTEAD (Jolley ranchers or Stover's or Life Savers) or even ice chips will also do - the key is to swallow to prevent all throat clearing. NO OIL BASED VITAMINS - use powdered substitutes.     Please schedule a follow up office visit in 6 weeks, call sooner if needed

## 2017-01-11 ENCOUNTER — Encounter: Payer: Self-pay | Admitting: Internal Medicine

## 2017-01-11 NOTE — Assessment & Plan Note (Signed)
Body mass index is 36.74 kg/m.  -  trending trending down No results found for: TSH   Contributing to gerd risk/ doe/reviewed the need and the process to achieve and maintain neg calorie balance > defer f/u primary care including intermittently monitoring thyroid status

## 2017-01-11 NOTE — Assessment & Plan Note (Signed)
FENO 03/30/2016  =   47 on just saba  - 03/30/2016  After extensive coaching HFA effectiveness =    90% :  Try symbicort 80 2bid x 6 weeks then return with repeat spirometry >  04/06/2016 notified insurance prefers advair > try 115 hfa 2 bid   - Allergy profile 05/10/2016 >  Eos 0.4 /  IgE 60 Pos Grass/ cedar trees   - 05/10/2016 changed back to 4 weeks of symb 80 p flared on advair - FENO 06/07/2016  =   10 on symb 80 2bid  - Spirometry 06/07/2016  FEV1 1.89 (58%)  Ratio  71   - 09/06/2016  After extensive coaching HFA effectiveness =    90% try dulera 200 2bid - 11/28/2016 excess shaking on duelra 200 2bid so try one bid and off gerd rx since is not convinced worked - Sinus CT 11/29/2016 >>> Mucosal edema in the maxillary and ethmoid sinuses bilaterally. No air-fluid level.  Poor symptom control, did not follow any of his contingency instructions re use of dulera in max doses or control of gerd  I had an extended discussion with the patient reviewing all relevant studies completed to date and  lasting 15 to 20 minutes of a 25 minute visit    Each maintenance medication was reviewed in detail including most importantly the difference between maintenance and prns and under what circumstances the prns are to be triggered using an action plan format that is not reflected in the computer generated alphabetically organized AVS.    Please see AVS for specific instructions unique to this visit that I personally wrote and verbalized to the the pt in detail and then reviewed with pt  by my nurse highlighting any  changes in therapy recommended at today's visit to their plan of care.

## 2017-01-11 NOTE — Assessment & Plan Note (Signed)
Quit smoking 1990 Spirometry 03/30/2016  FEV1 1.47 (45%)  Ratio 62    Most of his symptoms are being driven by his asthmatic and upper airway symptoms plus obesity.  May consider though a trial of lama to see what if any benefit on ex tol this has

## 2017-01-17 ENCOUNTER — Ambulatory Visit
Admission: RE | Admit: 2017-01-17 | Discharge: 2017-01-17 | Disposition: A | Discharge status: Home / Self Care | Payer: Medicare Other | Source: Ambulatory Visit | Attending: Internal Medicine | Admitting: Internal Medicine

## 2017-01-17 DIAGNOSIS — R7989 Other specified abnormal findings of blood chemistry: Secondary | ICD-10-CM

## 2017-01-17 DIAGNOSIS — R945 Abnormal results of liver function studies: Secondary | ICD-10-CM

## 2017-02-27 ENCOUNTER — Ambulatory Visit: Payer: Medicare Other | Admitting: Internal Medicine

## 2017-03-07 ENCOUNTER — Encounter (HOSPITAL_COMMUNITY): Payer: Self-pay | Admitting: Emergency Medicine

## 2017-03-07 ENCOUNTER — Inpatient Hospital Stay (HOSPITAL_COMMUNITY)
Admission: EM | Admit: 2017-03-07 | Discharge: 2017-03-10 | DRG: 378 | Disposition: A | Discharge status: Home / Self Care | Payer: Medicare Other | Attending: Internal Medicine | Admitting: Internal Medicine

## 2017-03-07 DIAGNOSIS — E662 Morbid (severe) obesity with alveolar hypoventilation: Secondary | ICD-10-CM | POA: Diagnosis present

## 2017-03-07 DIAGNOSIS — D62 Acute posthemorrhagic anemia: Secondary | ICD-10-CM | POA: Diagnosis present

## 2017-03-07 DIAGNOSIS — I482 Chronic atrial fibrillation: Secondary | ICD-10-CM | POA: Diagnosis present

## 2017-03-07 DIAGNOSIS — Z7901 Long term (current) use of anticoagulants: Secondary | ICD-10-CM

## 2017-03-07 DIAGNOSIS — Z6835 Body mass index (BMI) 35.0-35.9, adult: Secondary | ICD-10-CM | POA: Diagnosis not present

## 2017-03-07 DIAGNOSIS — D500 Iron deficiency anemia secondary to blood loss (chronic): Secondary | ICD-10-CM

## 2017-03-07 DIAGNOSIS — K648 Other hemorrhoids: Secondary | ICD-10-CM | POA: Diagnosis present

## 2017-03-07 DIAGNOSIS — K219 Gastro-esophageal reflux disease without esophagitis: Secondary | ICD-10-CM | POA: Diagnosis present

## 2017-03-07 DIAGNOSIS — Z86010 Personal history of colonic polyps: Secondary | ICD-10-CM | POA: Diagnosis not present

## 2017-03-07 DIAGNOSIS — K317 Polyp of stomach and duodenum: Secondary | ICD-10-CM | POA: Diagnosis present

## 2017-03-07 DIAGNOSIS — K644 Residual hemorrhoidal skin tags: Secondary | ICD-10-CM | POA: Diagnosis present

## 2017-03-07 DIAGNOSIS — J449 Chronic obstructive pulmonary disease, unspecified: Secondary | ICD-10-CM | POA: Diagnosis present

## 2017-03-07 DIAGNOSIS — F1010 Alcohol abuse, uncomplicated: Secondary | ICD-10-CM | POA: Diagnosis present

## 2017-03-07 DIAGNOSIS — Z87891 Personal history of nicotine dependence: Secondary | ICD-10-CM | POA: Diagnosis not present

## 2017-03-07 DIAGNOSIS — Z825 Family history of asthma and other chronic lower respiratory diseases: Secondary | ICD-10-CM

## 2017-03-07 DIAGNOSIS — K5730 Diverticulosis of large intestine without perforation or abscess without bleeding: Secondary | ICD-10-CM | POA: Diagnosis present

## 2017-03-07 DIAGNOSIS — I10 Essential (primary) hypertension: Secondary | ICD-10-CM | POA: Diagnosis present

## 2017-03-07 DIAGNOSIS — Z880 Allergy status to penicillin: Secondary | ICD-10-CM | POA: Diagnosis not present

## 2017-03-07 DIAGNOSIS — K922 Gastrointestinal hemorrhage, unspecified: Secondary | ICD-10-CM | POA: Diagnosis present

## 2017-03-07 DIAGNOSIS — R0602 Shortness of breath: Secondary | ICD-10-CM | POA: Diagnosis not present

## 2017-03-07 DIAGNOSIS — K760 Fatty (change of) liver, not elsewhere classified: Secondary | ICD-10-CM | POA: Diagnosis present

## 2017-03-07 DIAGNOSIS — K921 Melena: Principal | ICD-10-CM | POA: Diagnosis present

## 2017-03-07 LAB — POC OCCULT BLOOD, ED: FECAL OCCULT BLD: NEGATIVE

## 2017-03-07 LAB — CBC
HEMATOCRIT: 14 % — AB (ref 39.0–52.0)
Hemoglobin: 3.8 g/dL — CL (ref 13.0–17.0)
MCH: 22.6 pg — AB (ref 26.0–34.0)
MCHC: 27.1 g/dL — ABNORMAL LOW (ref 30.0–36.0)
MCV: 83.3 fL (ref 78.0–100.0)
PLATELETS: 296 10*3/uL (ref 150–400)
RBC: 1.68 MIL/uL — ABNORMAL LOW (ref 4.22–5.81)
RDW: 22.5 % — AB (ref 11.5–15.5)
WBC: 7.7 10*3/uL (ref 4.0–10.5)

## 2017-03-07 LAB — PROTIME-INR
INR: 1.18
Prothrombin Time: 15 seconds (ref 11.4–15.2)

## 2017-03-07 LAB — COMPREHENSIVE METABOLIC PANEL
ALBUMIN: 2.8 g/dL — AB (ref 3.5–5.0)
ALT: 15 U/L — AB (ref 17–63)
AST: 26 U/L (ref 15–41)
Alkaline Phosphatase: 70 U/L (ref 38–126)
Anion gap: 6 (ref 5–15)
BUN: 22 mg/dL — AB (ref 6–20)
CHLORIDE: 112 mmol/L — AB (ref 101–111)
CO2: 21 mmol/L — AB (ref 22–32)
CREATININE: 1.35 mg/dL — AB (ref 0.61–1.24)
Calcium: 8.2 mg/dL — ABNORMAL LOW (ref 8.9–10.3)
GFR calc Af Amer: 58 mL/min — ABNORMAL LOW (ref >60)
GFR calc non Af Amer: 50 mL/min — ABNORMAL LOW (ref >60)
GLUCOSE: 144 mg/dL — AB (ref 65–99)
POTASSIUM: 4 mmol/L (ref 3.5–5.1)
SODIUM: 139 mmol/L (ref 135–145)
Total Bilirubin: 0.6 mg/dL (ref 0.3–1.2)
Total Protein: 5.1 g/dL — ABNORMAL LOW (ref 6.5–8.1)

## 2017-03-07 LAB — ABO/RH: ABO/RH(D): O POS

## 2017-03-07 LAB — PREPARE RBC (CROSSMATCH)

## 2017-03-07 MED ORDER — SODIUM CHLORIDE 0.9 % IV SOLN
Freq: Once | INTRAVENOUS | Status: AC
  Administered 2017-03-08: 11:00:00 via INTRAVENOUS

## 2017-03-07 MED ORDER — SODIUM CHLORIDE 0.9 % IV BOLUS (SEPSIS)
1000.0000 mL | Freq: Once | INTRAVENOUS | Status: AC
  Administered 2017-03-07: 1000 mL via INTRAVENOUS

## 2017-03-07 MED ORDER — PANTOPRAZOLE SODIUM 40 MG IV SOLR: 40.0000 mg | Freq: Once | INTRAVENOUS | Status: AC

## 2017-03-07 MED ORDER — SODIUM CHLORIDE 0.9 % IV SOLN: 10.0000 mL/h | Freq: Once | INTRAVENOUS | Status: AC

## 2017-03-07 MED ORDER — MOMETASONE FURO-FORMOTEROL FUM 200-5 MCG/ACT IN AERO
2.0000 | INHALATION_SPRAY | Freq: Two times a day (BID) | RESPIRATORY_TRACT | Status: DC
  Administered 2017-03-08 – 2017-03-10 (×6): 2 via RESPIRATORY_TRACT
  Filled 2017-03-07: qty 8.8

## 2017-03-07 MED ORDER — ALBUTEROL SULFATE (2.5 MG/3ML) 0.083% IN NEBU: 3.0000 mL | INHALATION_SOLUTION | Freq: Four times a day (QID) | RESPIRATORY_TRACT | Status: DC | PRN

## 2017-03-07 MED ORDER — VITAMIN B-1 100 MG PO TABS
100.0000 mg | ORAL_TABLET | Freq: Every day | ORAL | Status: DC
  Administered 2017-03-08 – 2017-03-10 (×3): 100 mg via ORAL
  Filled 2017-03-07 (×3): qty 1

## 2017-03-07 MED ORDER — ATORVASTATIN CALCIUM 40 MG PO TABS
40.0000 mg | ORAL_TABLET | Freq: Every day | ORAL | Status: DC
  Administered 2017-03-08 – 2017-03-10 (×3): 40 mg via ORAL
  Filled 2017-03-07 (×4): qty 1

## 2017-03-07 MED ORDER — SODIUM CHLORIDE 0.9% FLUSH
3.0000 mL | Freq: Two times a day (BID) | INTRAVENOUS | Status: DC
  Administered 2017-03-08: 3 mL via INTRAVENOUS
  Administered 2017-03-08 – 2017-03-09 (×2): 10 mL via INTRAVENOUS
  Administered 2017-03-10: 3 mL via INTRAVENOUS

## 2017-03-07 MED ORDER — PANTOPRAZOLE SODIUM 40 MG IV SOLR
40.0000 mg | Freq: Two times a day (BID) | INTRAVENOUS | Status: DC
  Administered 2017-03-08 – 2017-03-10 (×4): 40 mg via INTRAVENOUS
  Filled 2017-03-07 (×4): qty 40

## 2017-03-07 MED ORDER — ALLOPURINOL 100 MG PO TABS
100.0000 mg | ORAL_TABLET | Freq: Every day | ORAL | Status: DC
  Administered 2017-03-08 – 2017-03-10 (×3): 100 mg via ORAL
  Filled 2017-03-07 (×3): qty 1

## 2017-03-07 NOTE — ED Provider Notes (Signed)
[facility] DEPT Provider Note   CSN: [id number] Arrival date & time: 03/07/17  8469     History   Chief Complaint Chief Complaint  Patient presents with  . Low hemoglobin of 4.0 per physician    HPI Darren [name] is a 76 y.o. malewith history of atrial fibrillation anticoagulated on Xarelto presents with a 3-4 week history of dark/black stools. Patient reports light brown stool today. Patient was sent by his doctor and GI today after hemoglobin result of 4.0. Patient has had progressive shortness of breath on exertion, lightheadedness on standing for the past 3 weeks as well. He denies any chest pain. He also denies any abdominal pain. Patient had one episode of vomiting on 02/08/2017, but none since. Patient's last dose of Xarelto was 48 hours ago. Patient denies any urinary symptoms or other complaints.  HPI  Past Medical History:  Diagnosis Date  . Atrial fibrillation ([place])   . OSA (obstructive sleep apnea)     Patient Active Problem List   Diagnosis Date Noted  . GI bleed 03/07/2017  . Cough variant asthma with ? component of UACS 03/30/2016  . COPD  GOLD II/III  03/30/2016  . Morbid obesity due to excess calories ([place]) 03/30/2016    History reviewed. No pertinent surgical history.     Home Medications    Prior to Admission medications   Medication Sig Start Date End Date Taking? Authorizing Provider  albuterol (PROAIR HFA) 108 (90 Base) MCG/ACT inhaler Inhale 2 puffs into the lungs every 6 (six) hours as needed for wheezing or shortness of breath.   Yes [provider]  ALLOPURINOL PO Take 100 mg by mouth daily.    Yes [provider]  atorvastatin (LIPITOR) 40 MG tablet Take 40 mg by mouth daily.   Yes [provider]  Cholecalciferol (VITAMIN D-3) 1000 units CAPS Take 1 capsule by mouth daily.   Yes [provider]  COLCHICINE PO Take 1 tablet by mouth as needed.    Yes [provider]  colesevelam (WELCHOL) 625 MG  tablet Take 625 mg by mouth 2 (two) times daily with a meal.   Yes [provider]  magnesium oxide (MAG-OX) 400 MG tablet Take 400 mg by mouth daily.   Yes [provider]  metoprolol tartrate (LOPRESSOR) 25 MG tablet Take 25 mg by mouth 2 (two) times daily.   Yes [provider]  mometasone-formoterol (DULERA) 200-5 MCG/ACT AERO Inhale 2 puffs into the lungs 2 (two) times daily. 09/06/16  Yes Tanda Rockers, MD  omeprazole (PRILOSEC) 40 MG capsule Take 1 capsule (40 mg total) by mouth daily. 09/06/16  Yes Tanda Rockers, MD  OVER THE COUNTER MEDICATION Taking several supplements but do not know the name.   Yes [provider]  Propylene Glycol (SYSTANE BALANCE) 0.6 % SOLN As directed   Yes [provider]  rivaroxaban (XARELTO) 20 MG TABS tablet Take 20 mg by mouth daily with supper.   Yes [provider]  thiamine (VITAMIN B-1) 100 MG tablet Take 100 mg by mouth daily.   Yes [provider]  famotidine (PEPCID) 20 MG tablet One at bedtime Patient not taking: Reported on 01/09/2017 09/06/16   Tanda Rockers, MD  mometasone-formoterol (DULERA) 200-5 MCG/ACT AERO Inhale 2 puffs into the lungs daily. Patient not taking: Reported on 03/07/2017 01/09/17   Tanda Rockers, MD  montelukast (SINGULAIR) 10 MG tablet Take 1 tablet (10 mg total) by mouth at bedtime. Patient  not taking: Reported on 03/07/2017 01/09/17   Tanda Rockers, MD    Family History Family History  Problem Relation Age of Onset  . Emphysema Father        smoked    Social History Social History  Substance Use Topics  . Smoking status: Former Smoker    Packs/day: 1.00    Years: 25.00    Quit date: 07/10/1988  . Smokeless tobacco: Never Used  . Alcohol use Yes     Comment: daily but not for a couple of weeks     Allergies   Penicillins   Review of Systems Review of Systems  Constitutional: Negative for chills and fever.  HENT: Negative for facial swelling and  sore throat.   Respiratory: Positive for shortness of breath (on exertion).   Cardiovascular: Negative for chest pain.  Gastrointestinal: Positive for blood in stool. Negative for abdominal pain, nausea and vomiting.  Genitourinary: Negative for dysuria.  Musculoskeletal: Negative for back pain.  Skin: Positive for pallor. Negative for rash and wound.  Neurological: Positive for light-headedness. Negative for headaches.  Psychiatric/Behavioral: The patient is not nervous/anxious.      Physical Exam Updated Vital Signs BP 119/63   Pulse 78   Temp 99.3 F (37.4 C) (Oral)   Resp (!) 22   Ht 5\' 11"  (1.803 m)   Wt 115.2 kg (254 lb)   SpO2 100%   BMI 35.43 kg/m   Physical Exam  Constitutional: He appears well-developed and well-nourished. No distress.  HENT:  Head: Normocephalic and atraumatic.  Mouth/Throat: Oropharynx is clear and moist. No oropharyngeal exudate.  Eyes: Pupils are equal, round, and reactive to light. Conjunctivae are normal. Right eye exhibits no discharge. Left eye exhibits no discharge. No scleral icterus.  Pale conjunctiva  Neck: Normal range of motion. Neck supple. No thyromegaly present.  Cardiovascular: Normal rate, regular rhythm, normal heart sounds and intact distal pulses.  Exam reveals no gallop and no friction rub.   No murmur heard. Pulmonary/Chest: Effort normal and breath sounds normal. No stridor. No respiratory distress. He has no wheezes. He has no rales.  Abdominal: Soft. Bowel sounds are normal. He exhibits no distension. There is no tenderness. There is no rebound and no guarding.  Genitourinary: Rectal exam shows no external hemorrhoid, no internal hemorrhoid, no mass, no tenderness and anal tone normal.  Musculoskeletal: He exhibits no edema.  Lymphadenopathy:    He has no cervical adenopathy.  Neurological: He is alert. Coordination normal.  Skin: Skin is warm and dry. No rash noted. He is not diaphoretic. There is pallor.  Psychiatric:  He has a normal mood and affect.  Nursing note and vitals reviewed.    ED Treatments / Results  Labs (all labs ordered are listed, but only abnormal results are displayed) Labs Reviewed  COMPREHENSIVE METABOLIC PANEL - Abnormal; Notable for the following:       Result Value   Chloride 112 (*)    CO2 21 (*)    Glucose, Bld 144 (*)    BUN 22 (*)    Creatinine, Ser 1.35 (*)    Calcium 8.2 (*)    Total Protein 5.1 (*)    Albumin 2.8 (*)    ALT 15 (*)    GFR calc non Af Amer 50 (*)    GFR calc Af Amer 58 (*)    All other components within normal limits  CBC - Abnormal; Notable for the following:    RBC 1.68 (*)  Hemoglobin 3.8 (*)    HCT 14.0 (*)    MCH 22.6 (*)    MCHC 27.1 (*)    RDW 22.5 (*)    All other components within normal limits  PROTIME-INR  CBC  BASIC METABOLIC PANEL  H. PYLORI ANTIBODY, IGG  POC OCCULT BLOOD, ED  I-STAT CHEM 8, ED  TYPE AND SCREEN  PREPARE RBC (CROSSMATCH)  ABO/RH  PREPARE RBC (CROSSMATCH)    EKG  EKG Interpretation None       Radiology No results found.  Procedures Procedures (including critical care time)  Medications Ordered in ED Medications  pantoprazole (PROTONIX) injection 40 mg (not administered)  0.9 %  sodium chloride infusion (not administered)  sodium chloride flush (NS) 0.9 % injection 3 mL (not administered)  mometasone-formoterol (DULERA) 200-5 MCG/ACT inhaler 2 puff (not administered)  albuterol (PROVENTIL) (2.5 MG/3ML) 0.083% nebulizer solution 3 mL (not administered)  allopurinol (ZYLOPRIM) tablet 100 mg (not administered)  atorvastatin (LIPITOR) tablet 40 mg (not administered)  thiamine (VITAMIN B-1) tablet 100 mg (not administered)  pantoprazole (PROTONIX) injection 40 mg (not administered)  0.9 %  sodium chloride infusion (not administered)  sodium chloride 0.9 % bolus 1,000 mL (1,000 mLs Intravenous New Bag/Given 03/07/17 2031)     Initial Impression / Assessment and Plan / ED Course  I have  reviewed the triage vital signs and the nursing notes.  Pertinent labs & imaging results that were available during my care of the patient were reviewed by me and considered in my medical decision making (see chart for details).     CHA2DS2-VASc score 1  Patient with 3-4 weeks of dark stools. Fecal occult negative today. Hemoglobin 3.8. Transfusion of PRBCs initiated in the ED. CMP shows chloride 112, when necessary 22, creatinine 1.35, protein 5.1, albumin 2.8. I spoke with gastroenterologist, Dr. Alessandra Bevels,  who advises admission, EGD tomorrow and will consult on the patient in the morning. I spoke with Dr. Stana Bunting with [facility] who will admit the patient for further evaluation and treatment. I discussed patient case with Dr. Roderic Palau who guided the patient's management and agrees with plan.   Final Clinical Impressions(s) / ED Diagnoses   Final diagnoses:  Iron deficiency anemia due to chronic blood loss    New Prescriptions New Prescriptions   No medications on file      Caryl Ada 03/07/17 2302    Milton Ferguson, MD 03/08/17 1254

## 2017-03-07 NOTE — ED Notes (Signed)
Pt states he has had black stools and today it was light brown. Along with SOB with exertion and feels like his heart was racing but would sit down to relax and he would feel better.

## 2017-03-07 NOTE — ED Triage Notes (Signed)
Reports having dark stools for about a month.  Called byPCP today reporting hemoglobin of 4.0.  Was told to come to ER.

## 2017-03-08 ENCOUNTER — Encounter (HOSPITAL_COMMUNITY): Payer: Self-pay | Admitting: *Deleted

## 2017-03-08 DIAGNOSIS — K921 Melena: Principal | ICD-10-CM

## 2017-03-08 DIAGNOSIS — R0602 Shortness of breath: Secondary | ICD-10-CM

## 2017-03-08 LAB — PREPARE RBC (CROSSMATCH)

## 2017-03-08 LAB — RETICULOCYTES
RBC.: 2.83 MIL/uL — ABNORMAL LOW (ref 4.22–5.81)
Retic Count, Absolute: 121.7 10*3/uL (ref 19.0–186.0)
Retic Ct Pct: 4.3 % — ABNORMAL HIGH (ref 0.4–3.1)

## 2017-03-08 LAB — CBC
HEMATOCRIT: 18.7 % — AB (ref 39.0–52.0)
Hemoglobin: 5.4 g/dL — CL (ref 13.0–17.0)
MCH: 24.2 pg — ABNORMAL LOW (ref 26.0–34.0)
MCHC: 28.9 g/dL — ABNORMAL LOW (ref 30.0–36.0)
MCV: 83.9 fL (ref 78.0–100.0)
Platelets: 285 10*3/uL (ref 150–400)
RBC: 2.23 MIL/uL — AB (ref 4.22–5.81)
RDW: 19.6 % — AB (ref 11.5–15.5)
WBC: 7 10*3/uL (ref 4.0–10.5)

## 2017-03-08 LAB — BASIC METABOLIC PANEL
ANION GAP: 5 (ref 5–15)
BUN: 19 mg/dL (ref 6–20)
CALCIUM: 8.1 mg/dL — AB (ref 8.9–10.3)
CO2: 22 mmol/L (ref 22–32)
Chloride: 115 mmol/L — ABNORMAL HIGH (ref 101–111)
Creatinine, Ser: 1.21 mg/dL (ref 0.61–1.24)
GFR, EST NON AFRICAN AMERICAN: 57 mL/min — AB (ref >60)
GLUCOSE: 105 mg/dL — AB (ref 65–99)
POTASSIUM: 4 mmol/L (ref 3.5–5.1)
Sodium: 142 mmol/L (ref 135–145)

## 2017-03-08 LAB — VITAMIN B12: VITAMIN B 12: 1404 pg/mL — AB (ref 180–914)

## 2017-03-08 LAB — IRON AND TIBC
IRON: 17 ug/dL — AB (ref 45–182)
SATURATION RATIOS: 4 % — AB (ref 17.9–39.5)
TIBC: 461 ug/dL — AB (ref 250–450)
UIBC: 444 ug/dL

## 2017-03-08 LAB — HEMOGLOBIN AND HEMATOCRIT, BLOOD
HEMATOCRIT: 18.9 % — AB (ref 39.0–52.0)
HEMATOCRIT: 23.9 % — AB (ref 39.0–52.0)
HEMOGLOBIN: 5.4 g/dL — AB (ref 13.0–17.0)
Hemoglobin: 7.1 g/dL — ABNORMAL LOW (ref 13.0–17.0)

## 2017-03-08 LAB — FOLATE

## 2017-03-08 LAB — FERRITIN: Ferritin: 4 ng/mL — ABNORMAL LOW (ref 24–336)

## 2017-03-08 MED ORDER — FOLIC ACID 1 MG PO TABS
5.0000 mg | ORAL_TABLET | Freq: Every day | ORAL | Status: DC
  Administered 2017-03-08 – 2017-03-10 (×3): 5 mg via ORAL
  Filled 2017-03-08 (×3): qty 5

## 2017-03-08 MED ORDER — SODIUM CHLORIDE 0.9 % IV SOLN
Freq: Once | INTRAVENOUS | Status: AC
  Administered 2017-03-08: 11:00:00 via INTRAVENOUS

## 2017-03-08 MED ORDER — SODIUM CHLORIDE 0.9 % IV SOLN: INTRAVENOUS | Status: DC

## 2017-03-08 NOTE — Progress Notes (Signed)
RT NOTE:  Pt states he is able to manage CPAP machine tonight. He will call RN if assistance is needed.

## 2017-03-08 NOTE — Consult Note (Signed)
[facility] Consult  Referring Provider: Vilma Prader, MD Primary Care Physician:  [name], MD Primary Gastroenterologist: Dr.Buccini  Reason for Consultation:  Symptomatic anemia, black stools  HPI: Darren [name] is a 76 y.o. Caucasian male who was seen in the office on 03/07/2017 for symptomatic anemia. He was then sent to ER for admission as his hemoglobin was reported to be 4.  Patient states he was in his usual state of health until the end of July when he had 1 episode of coffee-ground emesis. He had 2 glasses of red wine that night and did not think much of it. However, in the next few days he noticed that his stools were unusually dark and black at times. Consistency of stool was described as soft, 1- 2 per day. For the last 3 weeks, he has been eating small frequent portions with the intention to lose weight and reports 16 pound weight loss in 3 weeks.  He has frequent "esophageal spasms" with cold fluids, however denies difficulty or pain swallowing solids. No prior EGD. He takes NSAIDs very rarely, last ibuprofen use was over a month ago. He denies bright red blood or maroon stools, denies abdominal pain, acid reflux or early satiety.  Over the last 1 month, he reports progressively worsening shortness of breath, fatigue and dizziness. His blood pressure has been running low over the last several days and he has stopped taking one of his blood pressure medication subsequently.   Lab work done 01/06/17 showed a hemoglobin of 13 grams and low iron of 13. Lab work done yesterday 03/07/17 showed low iron of 10, iron saturation 2%, ferritin 2.6 consistent with iron deficiency.   He has had multiple colonoscopies 2005, 2009, 2011, 07/2015 with history of colon polyps. 3 year repeat colonoscopy will be due 07/2018.    Past Medical History:  Diagnosis Date  . Atrial fibrillation ([place])   . OSA (obstructive sleep apnea)     History reviewed. No pertinent surgical  history.  Prior to Admission medications   Medication Sig Start Date End Date Taking? Authorizing Provider  albuterol (PROAIR HFA) 108 (90 Base) MCG/ACT inhaler Inhale 2 puffs into the lungs every 6 (six) hours as needed for wheezing or shortness of breath.   Yes [provider]  ALLOPURINOL PO Take 100 mg by mouth daily.    Yes [provider]  atorvastatin (LIPITOR) 40 MG tablet Take 40 mg by mouth daily.   Yes [provider]  Cholecalciferol (VITAMIN D-3) 1000 units CAPS Take 1 capsule by mouth daily.   Yes [provider]  COLCHICINE PO Take 1 tablet by mouth as needed.    Yes [provider]  colesevelam (WELCHOL) 625 MG tablet Take 625 mg by mouth 2 (two) times daily with a meal.   Yes [provider]  magnesium oxide (MAG-OX) 400 MG tablet Take 400 mg by mouth daily.   Yes [provider]  metoprolol tartrate (LOPRESSOR) 25 MG tablet Take 25 mg by mouth 2 (two) times daily.   Yes [provider]  mometasone-formoterol (DULERA) 200-5 MCG/ACT AERO Inhale 2 puffs into the lungs 2 (two) times daily. 09/06/16  Yes Tanda Rockers, MD  omeprazole (PRILOSEC) 40 MG capsule Take 1 capsule (40 mg total) by mouth daily. 09/06/16  Yes Tanda Rockers, MD  OVER THE COUNTER MEDICATION Taking several supplements but do not know the name.   Yes [provider]  Propylene Glycol (SYSTANE BALANCE) 0.6 % SOLN  As directed   Yes [provider]  rivaroxaban (XARELTO) 20 MG TABS tablet Take 20 mg by mouth daily with supper.   Yes [provider]  thiamine (VITAMIN B-1) 100 MG tablet Take 100 mg by mouth daily.   Yes [provider]  famotidine (PEPCID) 20 MG tablet One at bedtime Patient not taking: Reported on 01/09/2017 09/06/16   Tanda Rockers, MD  mometasone-formoterol (DULERA) 200-5 MCG/ACT AERO Inhale 2 puffs into the lungs daily. Patient not taking: Reported on 03/07/2017 01/09/17   Tanda Rockers, MD   montelukast (SINGULAIR) 10 MG tablet Take 1 tablet (10 mg total) by mouth at bedtime. Patient not taking: Reported on 03/07/2017 01/09/17   Tanda Rockers, MD    Current Facility-Administered Medications  Medication Dose Route Frequency Provider Last Rate Last Dose  . 0.9 %  sodium chloride infusion  10 mL/hr Intravenous Once Law, Alexandra M, PA-C      . 0.9 %  sodium chloride infusion   Intravenous Once Vilma Prader, MD      . 0.9 %  sodium chloride infusion   Intravenous Once Hosie Poisson, MD      . albuterol (PROVENTIL) (2.5 MG/3ML) 0.083% nebulizer solution 3 mL  3 mL Inhalation Q6H PRN Vilma Prader, MD      . allopurinol (ZYLOPRIM) tablet 100 mg  100 mg Oral Daily Vilma Prader, MD      . atorvastatin (LIPITOR) tablet 40 mg  40 mg Oral Daily Vilma Prader, MD      . folic acid (FOLVITE) tablet 5 mg  5 mg Oral Daily Vilma Prader, MD      . mometasone-formoterol [facility]) 200-5 MCG/ACT inhaler 2 puff  2 puff Inhalation BID Vilma Prader, MD      . pantoprazole (PROTONIX) injection 40 mg  40 mg Intravenous Once Milton Ferguson, MD      . pantoprazole (PROTONIX) injection 40 mg  40 mg Intravenous Q12H Vilma Prader, MD      . sodium chloride flush (NS) 0.9 % injection 3 mL  3 mL Intravenous Q12H Vilma Prader, MD      . thiamine (VITAMIN B-1) tablet 100 mg  100 mg Oral Daily Vilma Prader, MD        Allergies as of 03/07/2017 - Review Complete 03/07/2017  Allergen Reaction Noted  . Penicillins Hives 03/30/2016    Family History  Problem Relation Age of Onset  . Emphysema Father        smoked    Social History   Social History  . Marital status: Married    Spouse name: N/A  . Number of children: N/A  . Years of education: N/A   Occupational History  . Not on file.   Social History Main Topics  . Smoking status: Former Smoker    Packs/day: 1.00    Years: 25.00    Quit date: 07/10/1988  . Smokeless tobacco: Never Used  .  Alcohol use Yes     Comment: daily but not for a couple of weeks  . Drug use: No  . Sexual activity: Not on file   Other Topics Concern  . Not on file   Social History Narrative  . No narrative on file    Review of Systems: Positive for: GI: Described in detail in HPI.    Gen:  fatigue, weakness, malaise,Denies any fever, chills, rigors, night sweats, anorexia, involuntary weight loss and sleep disorder CV:  orthopnea, PND, peripheral edema,Denies chest pain, angina, palpitations, syncope  and claudication. Resp: dyspnea, Denies  cough, sputum, wheezing, coughing up blood. GU : Denies urinary burning, blood in urine, urinary frequency, urinary hesitancy, nocturnal urination, and urinary incontinence. MS: Denies joint pain or swelling.  Denies muscle weakness, cramps, atrophy.  Derm: Denies rash, itching, oral ulcerations, hives, unhealing ulcers.  Psych: Denies depression, anxiety, memory loss, suicidal ideation, hallucinations,  and confusion. Heme: Denies bruising, bleeding, and enlarged lymph nodes. Neuro:  dizziness, paresthesias.Denies any headaches.  Endo:  Denies any problems with DM, thyroid, adrenal function.  Physical Exam: Vital signs in last 24 hours: Temp:  [98.8 F (37.1 C)-100 F (37.8 C)] 99.4 F (37.4 C) (08/30 0648) Pulse Rate:  [72-92] 73 (08/30 0648) Resp:  [18-26] 18 (08/30 0648) BP: (116-124)/(55-73) 124/73 (08/30 0648) SpO2:  [97 %-100 %] 98 % (08/30 0648) Weight:  [115.2 kg (254 lb)-119.3 kg (263 lb)] 119.3 kg (263 lb) (08/30 0006) Last BM Date: 03/07/17  General:   Alert,  Well-developed, overweight, pleasant and cooperative in NAD Head:  Normocephalic and atraumatic. Eyes:  Sclera clear, no icterus.   Obvious pallor. Ears:  Normal auditory acuity. Nose:  No deformity, discharge,  or lesions. Mouth:  No deformity or lesions.  Oropharynx pink & moist. Neck:  Supple; no masses or thyromegaly. Lungs:  Clear throughout to auscultation.   No wheezes,  crackles, or rhonchi. No acute distress. Heart:  Regular rate and rhythm; no murmurs, clicks, rubs,  or gallops. Extremities:  Without clubbing, bilateral pitting pedal edema. Neurologic:  Alert and  oriented x4;  grossly normal neurologically. Skin:  Intact without significant lesions or rashes. Psych:  Alert and cooperative. Normal mood and affect. Abdomen:  Soft, nontender and nondistended. No masses, hepatosplenomegaly or hernias noted. Normal bowel sounds, without guarding, and without rebound.         Lab Results:  Recent Labs  03/07/17 1946 03/08/17 0349  WBC 7.7 7.0  HGB 3.8* 5.4*  HCT 14.0* 18.7*  PLT 296 285   BMET  Recent Labs  03/07/17 1946 03/08/17 0349  NA 139 142  K 4.0 4.0  CL 112* 115*  CO2 21* 22  GLUCOSE 144* 105*  BUN 22* 19  CREATININE 1.35* 1.21  CALCIUM 8.2* 8.1*   LFT  Recent Labs  03/07/17 1946  PROT 5.1*  ALBUMIN 2.8*  AST 26  ALT 15*  ALKPHOS 70  BILITOT 0.6   PT/INR  Recent Labs  03/07/17 1946  LABPROT 15.0  INR 1.18    Studies/Results: No results found.  Impression: 1. Severe symptomatic anemia, status post 2 units PRBC transfusion(hemoglobin 13.8 on 11/17, 3.8 on admission, 5.4 after 2 units transfusion). INR 1.18, last dose of Xarelto on 03/06/17. FOBT negative, as an outpatient FOBT was positive. 2. Hepatic steatosis 3. History of alcohol abuse(daily drinking, since 2010)   Plan: 1. Continue Protonix 40 mg IV every 12 hours. 2. Transfuse 2 more units of PRBC, plan to keep hemoglobin above 7. 3. Clear liquid diet and nothing by mouth post midnight for EGD in a.m. Differential diagnosis includes peptic ulcer disease/AV malformations/malignancy/esophageal varices(less likely, normal platelets, cirrhosis not noted on ultrasound). 4. Continue folic acid and thiamine supplementation(monitor for alcohol withdrawal)   LOS: 1 day   Ronnette Juniper, M.D.  03/08/2017, 8:36 AM  Pager [phone] If no answer or after 5 PM  call [phone]

## 2017-03-08 NOTE — H&P (Signed)
History and Physical   Darren [name]:[id number] DOB: Dec 16, 1940 DOA: 03/07/2017  PCP: [name], MD  Chief Complaint: Shortness of breath  HPI: 76 year old obese man with atrial fibrillation on diuretic therapy, hypertension, OSA, GERD who is presenting with dyspnea. He also reports black tarry stools since the beginning of August, he went to his primary care physician who checked a CBC and found his hemoglobin to be less than 5. He was subsequently instructed to come to the emergency department. He denies any abdominal pain, reports 1 episode of nausea and vomiting early in August. He reports having stopped taking his PPI therapy since July 2018, denies NSAID use, denies ever having been treated for H. pylori infection. He reports his last dose of the XARELTO was 03/06/2017 evening. Denies any coffee-ground emesis. He denies excessive alcohol intake, sites that he used to drink 3-4 alcoholic beverages but stopped that 2 months ago. He reports having a colonoscopy within the past 2 years that was reportedly normal.  Associated symptoms include fatigue, dyspnea on exertion. He denies any chest pain, lightheadedness, or syncope.  He reports these dark tarry stools have been persistent on a daily basis with the exception of the past 24 hours she reports they're more brown.  ED Course: In the emergency department his vital signs were relatively unremarkable with blood pressure on initial presentation of 160/55, heart rate 92, he did have a mild increase respiratory rate up to 26 breaths per minute on room air. Lab work revealed hemoglobin of 3.8, creatinine 1.35. The patient was ordered to have 2 units of packed blood cells transfused and first unit was transfusing in the department upon my evaluation.  ED rectal exam did not reveal melena. PT INR normal.  Review of Systems: A complete ROS was obtained; pertinent positives negatives are denoted in the HPI. Otherwise, all systems are negative.    Past Medical History:  Diagnosis Date  . Atrial fibrillation ([place])   . OSA (obstructive sleep apnea)    Social History   Social History  . Marital status: Married    Spouse name: N/A  . Number of children: N/A  . Years of education: N/A   Occupational History  . Not on file.   Social History Main Topics  . Smoking status: Former Smoker    Packs/day: 1.00    Years: 25.00    Quit date: 07/10/1988  . Smokeless tobacco: Never Used  . Alcohol use Yes     Comment: daily but not for a couple of weeks  . Drug use: No  . Sexual activity: Not on file   Other Topics Concern  . Not on file   Social History Narrative  . No narrative on file   Family History  Problem Relation Age of Onset  . Emphysema Father        smoked    Physical Exam: Vitals:   03/07/17 2117 03/07/17 2117 03/07/17 2136 03/08/17 0006  BP: (!) 124/58 (!) 124/58 119/63 (!) 122/57  Pulse: 73 74 78 73  Resp: 20 (!) 26 (!) 22 20  Temp:  99.1 F (37.3 C) 99.3 F (37.4 C) 100 F (37.8 C)  TempSrc:  Oral Oral Oral  SpO2: 100% 100% 100% 98%  Weight:    119.3 kg (263 lb)  Height:    5\' 11"  (1.803 m)   General: Appears calm and comfortable, obese white man, conjunctival pallor present ENT: Grossly normal hearing, MMM. Thick neck. Cardiovascular: RRR. No M/R/G. 1+ LE edeman  Respiratory: CTA bilaterally. No wheezes or crackles. Normal respiratory effort. Abdomen: Soft, non-tender. Bowel sounds present. Generous pannus. Skin: No rash or induration seen on limited exam. Musculoskeletal: Grossly normal tone BUE/BLE. Appropriate ROM.  Psychiatric: Grossly normal mood and affect. Neurologic: Moves all extremities in coordinated fashion.  I have personally reviewed the following labs, culture data, and imaging studies.  Assessment/Plan:  Severe acute blood loss anemia, likely related to GI bleed Assessment: melena occurrence x 1 month, no recent NSAID use or prior tx of H pylori; however, off PPI x ~2 months  in the setting of DOAC therapy.  No frank evidence of cirrhosis; although his ETOH intake has been heavy in the past, his abdominal US in 01/2017 revealed hepatic steatosis, no report of cirrhosis mentioned.  Currently hemodynamically stable, although home BB use noted as potentially blunting HR response. Plan: -maintain 2 large bore IVs  -NPO at midnight, follow up with GI in AM regarding EGD consideration -PPI IV BID -type and cross 2 more units -he will complete 2 units of prbcs tonight, repeat CBC in AM -avoid NSAIDs -checking H pylori serology -providing folic acid for bone marrow support in setting of anemia  Other problems -Atrial fibrillation: holding DOAC, holding BB in setting of GI bleed -Lower extremity edema: possibly related to underlying HF, but no TTE on file, recommend cautious IVF resuscitation with close attention to cardiopulmonary status -HTN: holding home medications in setting of GI bleed (BB) -Elevated CV risk: continuing home statin therapy -OSA: CPAP ordered -Gout hx: continue ppx allopurinol -Asthma hx: albuterol prn, continuing home Dulera  DVT prophylaxis: SCDs Code Status: full code Disposition Plan: Anticipate D/C home in 2-5 days Consults called: GI Admission status: admit to hospitalist service   Cheri Rous, MD [facility] YDXA:[phone]  If 7PM-7AM, please contact night-coverage [email or web address] Password [email or web address]

## 2017-03-08 NOTE — Progress Notes (Signed)
76 year old obese man with atrial fibrillation on diuretic therapy, hypertension, OSA, GERD who is presenting with dyspnea, black tarry stools, nausea, vomiting and generalized malaise. He was also on xarelto for atrial fibrillations. He was found to be severely anemic with a hemoglobin of 4 on arrival. He was admitted for further evaluation.    Plan:  Transfuse to keep hemoglobin greater than 7.  Anemia panel Post transfusion H&H afte rthe the 2 units of prbc later today.  EGD in am.    Darren [name] [id number]

## 2017-03-09 ENCOUNTER — Inpatient Hospital Stay (HOSPITAL_COMMUNITY): Payer: Medicare Other | Admitting: Anesthesiology

## 2017-03-09 ENCOUNTER — Encounter (HOSPITAL_COMMUNITY): Payer: Self-pay

## 2017-03-09 ENCOUNTER — Encounter (HOSPITAL_COMMUNITY)
Admission: EM | Disposition: A | Discharge status: Home / Self Care | Payer: Self-pay | Source: Home / Self Care | Attending: Internal Medicine

## 2017-03-09 DIAGNOSIS — D500 Iron deficiency anemia secondary to blood loss (chronic): Secondary | ICD-10-CM

## 2017-03-09 HISTORY — PX: ESOPHAGOGASTRODUODENOSCOPY (EGD) WITH PROPOFOL: SHX5813

## 2017-03-09 LAB — HEMOGLOBIN AND HEMATOCRIT, BLOOD
HCT: 25 % — ABNORMAL LOW (ref 39.0–52.0)
Hemoglobin: 7.3 g/dL — ABNORMAL LOW (ref 13.0–17.0)

## 2017-03-09 LAB — H. PYLORI ANTIBODY, IGG: H Pylori IgG: <0.8 Index Value (ref 0.00–0.79)

## 2017-03-09 SURGERY — ESOPHAGOGASTRODUODENOSCOPY (EGD) WITH PROPOFOL
Anesthesia: Monitor Anesthesia Care | Laterality: Left

## 2017-03-09 MED ORDER — PEG 3350-KCL-NA BICARB-NACL 420 G PO SOLR
4000.0000 mL | Freq: Once | ORAL | Status: AC
  Administered 2017-03-09: 4000 mL via ORAL
  Filled 2017-03-09: qty 4000

## 2017-03-09 MED ORDER — METOPROLOL TARTRATE 25 MG PO TABS
25.0000 mg | ORAL_TABLET | Freq: Two times a day (BID) | ORAL | Status: DC
  Administered 2017-03-09: 25 mg via ORAL
  Filled 2017-03-09: qty 1

## 2017-03-09 MED ORDER — ONDANSETRON HCL 4 MG/2ML IJ SOLN
INTRAMUSCULAR | Status: DC | PRN
  Administered 2017-03-09: 4 mg via INTRAVENOUS

## 2017-03-09 MED ORDER — LACTATED RINGERS IV SOLN
INTRAVENOUS | Status: DC | PRN
  Administered 2017-03-09: 11:00:00 via INTRAVENOUS

## 2017-03-09 MED ORDER — SODIUM CHLORIDE 0.9 % IV SOLN
500.0000 mg | Freq: Once | INTRAVENOUS | Status: DC
  Filled 2017-03-09: qty 10

## 2017-03-09 MED ORDER — SODIUM CHLORIDE 0.9 % IV SOLN
INTRAVENOUS | Status: DC
  Administered 2017-03-10: 500 mL via INTRAVENOUS

## 2017-03-09 MED ORDER — PROPOFOL 10 MG/ML IV BOLUS
INTRAVENOUS | Status: DC | PRN
  Administered 2017-03-09: 20 mg via INTRAVENOUS

## 2017-03-09 MED ORDER — SODIUM CHLORIDE 0.9 % IV SOLN
25.0000 mg | Freq: Once | INTRAVENOUS | Status: DC
  Filled 2017-03-09: qty 0.5

## 2017-03-09 MED ORDER — PROPOFOL 500 MG/50ML IV EMUL
INTRAVENOUS | Status: DC | PRN
  Administered 2017-03-09: 100 ug/kg/min via INTRAVENOUS

## 2017-03-09 MED ORDER — LACTATED RINGERS IV SOLN
INTRAVENOUS | Status: DC
  Administered 2017-03-09: 12:00:00 via INTRAVENOUS

## 2017-03-09 MED ORDER — SODIUM CHLORIDE 0.9 % IV SOLN
500.0000 mg | Freq: Once | INTRAVENOUS | Status: AC
  Administered 2017-03-09: 500 mg via INTRAVENOUS
  Filled 2017-03-09: qty 40

## 2017-03-09 NOTE — Progress Notes (Signed)
PROGRESS NOTE    Darren [name]  ZOX:[id number] DOB: [birth year] DOA: 03/07/2017 PCP: [name], MD    Brief Narrative: 76 year old obese man with atrial fibrillation on anticoagulation,  hypertension, OSA, GERD who is presenting with dyspnea, black tarry stools, nausea, vomiting and generalized malaise. He was also on xarelto for atrial fibrillation. He was found to be severely anemic with a hemoglobin of 4 on arrival. He was admitted for further evaluation. He underwent 4 units of prbc transfusion and hemoglobin improved to 7. GI consulted and he underwent EGD this am showing multiple sessile polyps in the fundus of the stomach, but no other abnormality was evident to explain his GI bleed.    Assessment & Plan:   Active Problems:   GI bleed   Iron deficiency anemia: anemia of chronic blood loss/ gi bleed; Admitted and underwent 4 units of prbc transfusion.  GI consulted and underwent EGD, which was unremarkable.  Plan for colonoscopy in am.  Plan for IV iron infusion for his low iron levels.    Hypertension well controlled.  GERD on PPI.   Chronic atrial fibrillation:  Rate controlled. Was on anti coagulation which was held for anemia.     DVT prophylaxisscd's Code Status: full code.  Family Communication: family at bedside.  Disposition Plan: pending colonoscopy.   Consultants:  GI.   Procedures:egd  Antimicrobials: none.   Subjective: No new complaints.  2 small brown stools today.   Objective: Vitals:   03/08/17 2031 03/09/17 0513 03/09/17 0900 03/09/17 0916  BP: 135/67 122/69 135/65   Pulse: 68 69 80 78  Resp: 19 18 19 20   Temp: 98.8 F (37.1 C) 98.5 F (36.9 C) 99 F (37.2 C)   TempSrc: Oral Oral Oral   SpO2: 100% 97% 98% 100%  Weight: 119.4 kg (263 lb 3.2 oz)     Height:        Intake/Output Summary (Last 24 hours) at 03/09/17 1025 Last data filed at 03/09/17 0900  Gross per 24 hour  Intake           2032.5 ml  Output              300 ml    Net           1732.5 ml   Filed Weights   03/07/17 1936 03/08/17 0006 03/08/17 2031  Weight: 115.2 kg (254 lb) 119.3 kg (263 lb) 119.4 kg (263 lb 3.2 oz)    Examination:  General exam: Appears calm and comfortable  Respiratory system: Clear to auscultation. Respiratory effort normal. Cardiovascular system: S1 & S2 heard, RRR. No JVD, murmurs, rubs, gallops or clicks. No pedal edema. Gastrointestinal system: Abdomen is nondistended, soft and nontender. No organomegaly or masses felt. Normal bowel sounds heard. Central nervous system: Alert and oriented. No focal neurological deficits. Extremities: Symmetric 5 x 5 power. Skin: No rashes, lesions or ulcers Psychiatry: Judgement and insight appear normal. Mood & affect appropriate.     Data Reviewed: I have personally reviewed following labs and imaging studies  CBC:  Recent Labs Lab 03/07/17 1946 03/08/17 0349 03/08/17 0954 03/08/17 1830  WBC 7.7 7.0  --   --   HGB 3.8* 5.4* 5.4* 7.1*  HCT 14.0* 18.7* 18.9* 23.9*  MCV 83.3 83.9  --   --   PLT 296 285  --   --    Basic Metabolic Panel:  Recent Labs Lab 03/07/17 1946 03/08/17 0349  NA 139 142  K 4.0 4.0  CL 112* 115*  CO2 21* 22  GLUCOSE 144* 105*  BUN 22* 19  CREATININE 1.35* 1.21  CALCIUM 8.2* 8.1*   GFR: Estimated Creatinine Clearance: 70.4 mL/min (by C-G formula based on SCr of 1.21 mg/dL). Liver Function Tests:  Recent Labs Lab 03/07/17 1946  AST 26  ALT 15*  ALKPHOS 70  BILITOT 0.6  PROT 5.1*  ALBUMIN 2.8*   No results for input(s): LIPASE, AMYLASE in the last 168 hours. No results for input(s): AMMONIA in the last 168 hours. Coagulation Profile:  Recent Labs Lab 03/07/17 1946  INR 1.18   Cardiac Enzymes: No results for input(s): CKTOTAL, CKMB, CKMBINDEX, TROPONINI in the last 168 hours. BNP (last 3 results) No results for input(s): PROBNP in the last 8760 hours. HbA1C: No results for input(s): HGBA1C in the last 72 hours. CBG: No  results for input(s): GLUCAP in the last 168 hours. Lipid Profile: No results for input(s): CHOL, HDL, LDLCALC, TRIG, CHOLHDL, LDLDIRECT in the last 72 hours. Thyroid Function Tests: No results for input(s): TSH, T4TOTAL, FREET4, T3FREE, THYROIDAB in the last 72 hours. Anemia Panel:  Recent Labs  03/08/17 1830  VITAMINB12 1,404*  FOLATE >100.0  FERRITIN 4*  TIBC 461*  IRON 17*  RETICCTPCT 4.3*   Sepsis Labs: No results for input(s): PROCALCITON, LATICACIDVEN in the last 168 hours.  No results found for this or any previous visit (from the past 240 hour(s)).       Radiology Studies: No results found.      Scheduled Meds: . allopurinol  100 mg Oral Daily  . atorvastatin  40 mg Oral Daily  . folic acid  5 mg Oral Daily  . mometasone-formoterol  2 puff Inhalation BID  . pantoprazole (PROTONIX) IV  40 mg Intravenous Q12H  . sodium chloride flush  3 mL Intravenous Q12H  . thiamine  100 mg Oral Daily   Continuous Infusions: . sodium chloride    . iron dextran (INFED/DEXFERRUM) infusion     Followed by  . iron dextran (INFED/DEXFERRUM) infusion       LOS: 2 days    Time spent:35 min    Karle Desrosier, MD [facility] Pager [phone] If 7PM-7AM, please contact night-coverage [email or web address] Password [facility] 03/09/2017, 10:25 AM

## 2017-03-09 NOTE — Transfer of Care (Signed)
Immediate Anesthesia Transfer of Care Note  Patient: Darren [name]  Procedure(s) Performed: Procedure(s): ESOPHAGOGASTRODUODENOSCOPY (EGD) WITH PROPOFOL (Left)  Patient Location: Endoscopy Unit  Anesthesia Type:MAC  Level of Consciousness: awake, alert  and oriented  Airway & Oxygen Therapy: Patient Spontanous Breathing and Patient connected to nasal cannula oxygen  Post-op Assessment: Report given to RN and Patient moving all extremities X 4  Post vital signs: Reviewed and stable  Last Vitals:  Vitals:   03/09/17 0916 03/09/17 1133  BP:  (!) 146/62  Pulse: 78 75  Resp: 20 14  Temp:  37.1 C  SpO2: 100% 100%    Last Pain:  Vitals:   03/09/17 1133  TempSrc: Oral  PainSc:          Complications: No apparent anesthesia complications

## 2017-03-09 NOTE — Op Note (Addendum)
[facility] Patient Name: Darren [name] Procedure Date : 03/09/2017 MRN: [id number] Attending MD: Ronnette Juniper , MD Date of Birth: 1940/08/08 CSN: [id number] Age: 76 Admit Type: Inpatient Procedure:                Upper GI endoscopy Indications:              Iron deficiency anemia due to suspected upper                            gastrointestinal bleeding, Melena Providers:                Ronnette Juniper, MD, Cleda Daub, RN, Alan Mulder,                            Technician Referring MD:              Medicines:                Monitored Anesthesia Care Complications:            No immediate complications. Estimated Blood Loss:     Estimated blood loss: none. Procedure:                Pre-Anesthesia Assessment:                           - Prior to the procedure, a History and Physical                            was performed, and patient medications and                            allergies were reviewed. The patient's tolerance of                            previous anesthesia was also reviewed. The risks                            and benefits of the procedure and the sedation                            options and risks were discussed with the patient.                            All questions were answered, and informed consent                            was obtained. Prior Anticoagulants: The patient has                            taken Xarelto (rivaroxaban), last dose was 3 days                            prior to procedure. ASA Grade Assessment: IV - A  patient with severe systemic disease that is a                            constant threat to life. After reviewing the risks                            and benefits, the patient was deemed in                            satisfactory condition to undergo the procedure.                           After obtaining informed consent, the endoscope was                            passed under direct  vision. Throughout the                            procedure, the patient's blood pressure, pulse, and                            oxygen saturations were monitored continuously. The                            EG-[id number] ([id number]) scope was introduced through the                            mouth, and advanced to the third part of duodenum.                            The upper GI endoscopy was accomplished without                            difficulty. The patient tolerated the procedure                            well. Scope In: Scope Out: Findings:      The examined esophagus was normal.      The Z-line was regular and was found 42 cm from the incisors.      Multiple 3 to 9 mm pedunculated and sessile polyps with no bleeding and       no stigmata of recent bleeding were found in the cardia, in the gastric       fundus and in the gastric body. Biopsies were taken with a cold forceps       for histology.      Localized mildly erythematous mucosa without bleeding was found in the       gastric antrum.      The examined duodenum was normal.      The cardia and gastric fundus were normal on retroflexion. Impression:               - Normal esophagus.                           - Z-line regular, 42  cm from the incisors.                           - Multiple gastric polyps. Biopsied.                           - Erythematous mucosa in the antrum.                           - Normal examined duodenum. Moderate Sedation:      Patient did not receive moderate sedation for this procedure, but       instead received monitored anesthesia care. Recommendation:           - Await pathology results.                           - Perform a colonoscopy tomorrow.                           - Clear liquid diet.                           - NPO post midnight.                           - Continue present medications,continue to hold                            Xarelto for now.                           - Return  patient to hospital ward for ongoing care. Procedure Code(s):        --- Professional ---                           [phone], Esophagogastroduodenoscopy, flexible,                            transoral; with biopsy, single or multiple Diagnosis Code(s):        --- Professional ---                           K31.7, Polyp of stomach and duodenum                           K31.89, Other diseases of stomach and duodenum                           D50.9, Iron deficiency anemia, unspecified                           K92.1, Melena (includes Hematochezia) CPT copyright 2016 American [employer] Association. All rights reserved. The codes documented in this report are preliminary and upon coder review may  be revised to meet current compliance requirements. Ronnette Juniper, MD 03/09/2017 12:38:56 PM This report has been signed electronically. Number of Addenda: 0

## 2017-03-09 NOTE — H&P (View-Only) (Signed)
[facility] Consult  Referring Provider: Vilma Prader, MD Primary Care Physician:  [name], MD Primary Gastroenterologist: Dr.Buccini  Reason for Consultation:  Symptomatic anemia, black stools  HPI: Darren [name] is a 76 y.o. Caucasian male who was seen in the office on 03/07/2017 for symptomatic anemia. He was then sent to ER for admission as his hemoglobin was reported to be 4.  Patient states he was in his usual state of health until the end of July when he had 1 episode of coffee-ground emesis. He had 2 glasses of red wine that night and did not think much of it. However, in the next few days he noticed that his stools were unusually dark and black at times. Consistency of stool was described as soft, 1- 2 per day. For the last 3 weeks, he has been eating small frequent portions with the intention to lose weight and reports 16 pound weight loss in 3 weeks.  He has frequent "esophageal spasms" with cold fluids, however denies difficulty or pain swallowing solids. No prior EGD. He takes NSAIDs very rarely, last ibuprofen use was over a month ago. He denies bright red blood or maroon stools, denies abdominal pain, acid reflux or early satiety.  Over the last 1 month, he reports progressively worsening shortness of breath, fatigue and dizziness. His blood pressure has been running low over the last several days and he has stopped taking one of his blood pressure medication subsequently.   Lab work done 01/06/17 showed a hemoglobin of 13 grams and low iron of 13. Lab work done yesterday 03/07/17 showed low iron of 10, iron saturation 2%, ferritin 2.6 consistent with iron deficiency.   He has had multiple colonoscopies 2005, 2009, 2011, 07/2015 with history of colon polyps. 3 year repeat colonoscopy will be due 07/2018.    Past Medical History:  Diagnosis Date  . Atrial fibrillation ([place])   . OSA (obstructive sleep apnea)     History reviewed. No pertinent surgical  history.  Prior to Admission medications   Medication Sig Start Date End Date Taking? Authorizing Provider  albuterol (PROAIR HFA) 108 (90 Base) MCG/ACT inhaler Inhale 2 puffs into the lungs every 6 (six) hours as needed for wheezing or shortness of breath.   Yes [provider]  ALLOPURINOL PO Take 100 mg by mouth daily.    Yes [provider]  atorvastatin (LIPITOR) 40 MG tablet Take 40 mg by mouth daily.   Yes [provider]  Cholecalciferol (VITAMIN D-3) 1000 units CAPS Take 1 capsule by mouth daily.   Yes [provider]  COLCHICINE PO Take 1 tablet by mouth as needed.    Yes [provider]  colesevelam (WELCHOL) 625 MG tablet Take 625 mg by mouth 2 (two) times daily with a meal.   Yes [provider]  magnesium oxide (MAG-OX) 400 MG tablet Take 400 mg by mouth daily.   Yes [provider]  metoprolol tartrate (LOPRESSOR) 25 MG tablet Take 25 mg by mouth 2 (two) times daily.   Yes [provider]  mometasone-formoterol (DULERA) 200-5 MCG/ACT AERO Inhale 2 puffs into the lungs 2 (two) times daily. 09/06/16  Yes Tanda Rockers, MD  omeprazole (PRILOSEC) 40 MG capsule Take 1 capsule (40 mg total) by mouth daily. 09/06/16  Yes Tanda Rockers, MD  OVER THE COUNTER MEDICATION Taking several supplements but do not know the name.   Yes [provider]  Propylene Glycol (SYSTANE BALANCE) 0.6 % SOLN  As directed   Yes [provider]  rivaroxaban (XARELTO) 20 MG TABS tablet Take 20 mg by mouth daily with supper.   Yes [provider]  thiamine (VITAMIN B-1) 100 MG tablet Take 100 mg by mouth daily.   Yes [provider]  famotidine (PEPCID) 20 MG tablet One at bedtime Patient not taking: Reported on 01/09/2017 09/06/16   Tanda Rockers, MD  mometasone-formoterol (DULERA) 200-5 MCG/ACT AERO Inhale 2 puffs into the lungs daily. Patient not taking: Reported on 03/07/2017 01/09/17   Tanda Rockers, MD   montelukast (SINGULAIR) 10 MG tablet Take 1 tablet (10 mg total) by mouth at bedtime. Patient not taking: Reported on 03/07/2017 01/09/17   Tanda Rockers, MD    Current Facility-Administered Medications  Medication Dose Route Frequency Provider Last Rate Last Dose  . 0.9 %  sodium chloride infusion  10 mL/hr Intravenous Once Law, Alexandra M, PA-C      . 0.9 %  sodium chloride infusion   Intravenous Once Vilma Prader, MD      . 0.9 %  sodium chloride infusion   Intravenous Once Hosie Poisson, MD      . albuterol (PROVENTIL) (2.5 MG/3ML) 0.083% nebulizer solution 3 mL  3 mL Inhalation Q6H PRN Vilma Prader, MD      . allopurinol (ZYLOPRIM) tablet 100 mg  100 mg Oral Daily Vilma Prader, MD      . atorvastatin (LIPITOR) tablet 40 mg  40 mg Oral Daily Vilma Prader, MD      . folic acid (FOLVITE) tablet 5 mg  5 mg Oral Daily Vilma Prader, MD      . mometasone-formoterol [facility]) 200-5 MCG/ACT inhaler 2 puff  2 puff Inhalation BID Vilma Prader, MD      . pantoprazole (PROTONIX) injection 40 mg  40 mg Intravenous Once Milton Ferguson, MD      . pantoprazole (PROTONIX) injection 40 mg  40 mg Intravenous Q12H Vilma Prader, MD      . sodium chloride flush (NS) 0.9 % injection 3 mL  3 mL Intravenous Q12H Vilma Prader, MD      . thiamine (VITAMIN B-1) tablet 100 mg  100 mg Oral Daily Vilma Prader, MD        Allergies as of 03/07/2017 - Review Complete 03/07/2017  Allergen Reaction Noted  . Penicillins Hives 03/30/2016    Family History  Problem Relation Age of Onset  . Emphysema Father        smoked    Social History   Social History  . Marital status: Married    Spouse name: N/A  . Number of children: N/A  . Years of education: N/A   Occupational History  . Not on file.   Social History Main Topics  . Smoking status: Former Smoker    Packs/day: 1.00    Years: 25.00    Quit date: 07/10/1988  . Smokeless tobacco: Never Used  .  Alcohol use Yes     Comment: daily but not for a couple of weeks  . Drug use: No  . Sexual activity: Not on file   Other Topics Concern  . Not on file   Social History Narrative  . No narrative on file    Review of Systems: Positive for: GI: Described in detail in HPI.    Gen:  fatigue, weakness, malaise,Denies any fever, chills, rigors, night sweats, anorexia, involuntary weight loss and sleep disorder CV:  orthopnea, PND, peripheral edema,Denies chest pain, angina, palpitations, syncope  and claudication. Resp: dyspnea, Denies  cough, sputum, wheezing, coughing up blood. GU : Denies urinary burning, blood in urine, urinary frequency, urinary hesitancy, nocturnal urination, and urinary incontinence. MS: Denies joint pain or swelling.  Denies muscle weakness, cramps, atrophy.  Derm: Denies rash, itching, oral ulcerations, hives, unhealing ulcers.  Psych: Denies depression, anxiety, memory loss, suicidal ideation, hallucinations,  and confusion. Heme: Denies bruising, bleeding, and enlarged lymph nodes. Neuro:  dizziness, paresthesias.Denies any headaches.  Endo:  Denies any problems with DM, thyroid, adrenal function.  Physical Exam: Vital signs in last 24 hours: Temp:  [98.8 F (37.1 C)-100 F (37.8 C)] 99.4 F (37.4 C) (08/30 0648) Pulse Rate:  [72-92] 73 (08/30 0648) Resp:  [18-26] 18 (08/30 0648) BP: (116-124)/(55-73) 124/73 (08/30 0648) SpO2:  [97 %-100 %] 98 % (08/30 0648) Weight:  [115.2 kg (254 lb)-119.3 kg (263 lb)] 119.3 kg (263 lb) (08/30 0006) Last BM Date: 03/07/17  General:   Alert,  Well-developed, overweight, pleasant and cooperative in NAD Head:  Normocephalic and atraumatic. Eyes:  Sclera clear, no icterus.   Obvious pallor. Ears:  Normal auditory acuity. Nose:  No deformity, discharge,  or lesions. Mouth:  No deformity or lesions.  Oropharynx pink & moist. Neck:  Supple; no masses or thyromegaly. Lungs:  Clear throughout to auscultation.   No wheezes,  crackles, or rhonchi. No acute distress. Heart:  Regular rate and rhythm; no murmurs, clicks, rubs,  or gallops. Extremities:  Without clubbing, bilateral pitting pedal edema. Neurologic:  Alert and  oriented x4;  grossly normal neurologically. Skin:  Intact without significant lesions or rashes. Psych:  Alert and cooperative. Normal mood and affect. Abdomen:  Soft, nontender and nondistended. No masses, hepatosplenomegaly or hernias noted. Normal bowel sounds, without guarding, and without rebound.         Lab Results:  Recent Labs  03/07/17 1946 03/08/17 0349  WBC 7.7 7.0  HGB 3.8* 5.4*  HCT 14.0* 18.7*  PLT 296 285   BMET  Recent Labs  03/07/17 1946 03/08/17 0349  NA 139 142  K 4.0 4.0  CL 112* 115*  CO2 21* 22  GLUCOSE 144* 105*  BUN 22* 19  CREATININE 1.35* 1.21  CALCIUM 8.2* 8.1*   LFT  Recent Labs  03/07/17 1946  PROT 5.1*  ALBUMIN 2.8*  AST 26  ALT 15*  ALKPHOS 70  BILITOT 0.6   PT/INR  Recent Labs  03/07/17 1946  LABPROT 15.0  INR 1.18    Studies/Results: No results found.  Impression: 1. Severe symptomatic anemia, status post 2 units PRBC transfusion(hemoglobin 13.8 on 11/17, 3.8 on admission, 5.4 after 2 units transfusion). INR 1.18, last dose of Xarelto on 03/06/17. FOBT negative, as an outpatient FOBT was positive. 2. Hepatic steatosis 3. History of alcohol abuse(daily drinking, since 2010)   Plan: 1. Continue Protonix 40 mg IV every 12 hours. 2. Transfuse 2 more units of PRBC, plan to keep hemoglobin above 7. 3. Clear liquid diet and nothing by mouth post midnight for EGD in a.m. Differential diagnosis includes peptic ulcer disease/AV malformations/malignancy/esophageal varices(less likely, normal platelets, cirrhosis not noted on ultrasound). 4. Continue folic acid and thiamine supplementation(monitor for alcohol withdrawal)   LOS: 1 day   Ronnette Juniper, M.D.  03/08/2017, 8:36 AM  Pager [phone] If no answer or after 5 PM  call [phone]

## 2017-03-09 NOTE — Anesthesia Postprocedure Evaluation (Signed)
Anesthesia Post Note  Patient: Darren [name]  Procedure(s) Performed: Procedure(s) (LRB): ESOPHAGOGASTRODUODENOSCOPY (EGD) WITH PROPOFOL (Left)     Patient location during evaluation: PACU Anesthesia Type: MAC Level of consciousness: awake and alert Pain management: pain level controlled Vital Signs Assessment: post-procedure vital signs reviewed and stable Respiratory status: spontaneous breathing, nonlabored ventilation, respiratory function stable and patient connected to nasal cannula oxygen Cardiovascular status: stable and blood pressure returned to baseline Anesthetic complications: no    Last Vitals:  Vitals:   03/09/17 1250 03/09/17 1343  BP: (!) 133/56 (!) 143/63  Pulse: 78 73  Resp: 18 17  Temp:  36.8 C  SpO2: 97% 100%    Last Pain:  Vitals:   03/09/17 1343  TempSrc: Oral  PainSc:                  Tiajuana Amass

## 2017-03-09 NOTE — Anesthesia Procedure Notes (Signed)
Procedure Name: MAC Date/Time: 03/09/2017 12:07 PM Performed by: Rush Farmer E Pre-anesthesia Checklist: Patient identified, Emergency Drugs available, Patient being monitored and Suction available Patient Re-evaluated:Patient Re-evaluated prior to induction Oxygen Delivery Method: Nasal cannula Induction Type: IV induction Placement Confirmation: positive ETCO2 Dental Injury: Teeth and Oropharynx as per pre-operative assessment

## 2017-03-09 NOTE — Significant Event (Signed)
Rapid Response Event Note  Overview:Called by bedside RN d/t pt feeling faint, BP-60s, SOB, diaphoretic, and c/o chest pressure when he breaths.Pt had just had large BM (pt undergoing colonoscopy prep) and had just received IV iron dose. Time Called: 2311 Arrival Time: 2313 Event Type: Respiratory, Cardiac, Hypotension  Initial Focused Assessment:  Upon my arrival, pt sitting in chair, pale, c/o feeling faint.  SBP-60s, mildy SOB. SpO2-100% on RA. Lungs clear with shallow respirations.  Interventions: Got pt back in bed, rechecked SBP-80s, placed pt on 2L [place].  Administered albuterol PRN treatment. Pt began to feel better after interventions. SBP-90s. Per pt SOB is better.  Plan of Care (if not transferred): Monitor pt.  Call RRT if further assistance needed.  Event Summary: Name of Physician Notified: Walden Field, NP at [phone]    at    Outcome: Stayed in room and stabalized  Event End Time: 2355  Dillard Essex

## 2017-03-09 NOTE — Anesthesia Preprocedure Evaluation (Deleted)
Anesthesia Evaluation  Patient identified by MRN, date of birth, ID band Patient awake    Reviewed: Allergy & Precautions, NPO status , Patient's Chart, lab work & pertinent test results  Airway Mallampati: III  TM Distance: >3 FB Neck ROM: Full    Dental  (+) Teeth Intact, Dental Advisory Given   Pulmonary sleep apnea and Continuous Positive Airway Pressure Ventilation , COPD, former smoker,    breath sounds clear to auscultation       Cardiovascular negative cardio ROS  + dysrhythmias Atrial Fibrillation  Rhythm:Regular Rate:Normal     Neuro/Psych negative neurological ROS     GI/Hepatic negative GI ROS, Neg liver ROS,   Endo/Other  Obesity  Renal/GU negative Renal ROS     Musculoskeletal   Abdominal   Peds  Hematology  (+) anemia ,   Anesthesia Other Findings   Reproductive/Obstetrics                            Lab Results  Component Value Date   WBC 7.0 03/08/2017   HGB 7.1 (L) 03/08/2017   HCT 23.9 (L) 03/08/2017   MCV 83.9 03/08/2017   PLT 285 03/08/2017   Lab Results  Component Value Date   CREATININE 1.21 03/08/2017   BUN 19 03/08/2017   NA 142 03/08/2017   K 4.0 03/08/2017   CL 115 (H) 03/08/2017   CO2 22 03/08/2017    Anesthesia Physical  Anesthesia Plan  ASA: III  Anesthesia Plan: MAC   Post-op Pain Management:    Induction: Intravenous  PONV Risk Score and Plan: 2 and Propofol infusion and Treatment may vary due to age or medical condition  Airway Management Planned: Natural Airway and Nasal Cannula  Additional Equipment:   Intra-op Plan:   Post-operative Plan:   Informed Consent: I have reviewed the patients History and Physical, chart, labs and discussed the procedure including the risks, benefits and alternatives for the proposed anesthesia with the patient or authorized representative who has indicated his/her understanding and acceptance.      Plan Discussed with: CRNA  Anesthesia Plan Comments: (  ----After completing preoperative evaluation and consenting patient, gastroenterologist decided to move forward with the procedure under conscious sedation without anesthesia service involvement.)      Anesthesia Quick Evaluation

## 2017-03-09 NOTE — Interval H&P Note (Signed)
History and Physical Interval Note: 76 year old Caucasian male with severe anemia and melena, 16 pound weight loss in 3 weeks, on Xarelto for atrial fibrillation for a diagnostic and possible therapeutic EGD.  03/09/2017 11:45 AM  Darren [name]  has presented today for EGD with the diagnosis of Melena, anemia  The various methods of treatment have been discussed with the patient and family. After consideration of risks, benefits and other options for treatment, the patient has consented to  Procedure(s): ESOPHAGOGASTRODUODENOSCOPY (EGD) WITH PROPOFOL (Left) as a surgical intervention .  The patient's history has been reviewed, patient examined, no change in status, stable for surgery.  I have reviewed the patient's chart and labs.  Questions were answered to the patient's satisfaction.     Ronnette Juniper

## 2017-03-09 NOTE — Op Note (Signed)
EGD showed a normal esophagus. There was no evidence of varices, narrowing or stricture. GE junction appeared unremarkable at 42 cm from incisors.  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 Biopsies have been taken.   Retroflexion did not reveal any additional abnormalities in the cardia or fundus. Antrum showed minimal erythema. Duodenal bulb and the rest of the duodenum appeared unremarkable.  No obvious evidence of blood loss in the upper GI tract. Recommend colonoscopy in a.m.[name] Clear liquid diet, colon prep to be administered accordingly.  Ronnette Juniper, M.D. [phone]

## 2017-03-09 NOTE — Anesthesia Preprocedure Evaluation (Addendum)
Anesthesia Evaluation  Patient identified by MRN, date of birth, ID band Patient awake    Reviewed: Allergy & Precautions, NPO status , Patient's Chart, lab work & pertinent test results  Airway Mallampati: II  TM Distance: >3 FB Neck ROM: Full    Dental  (+) Teeth Intact   Pulmonary sleep apnea , COPD, former smoker,    breath sounds clear to auscultation       Cardiovascular negative cardio ROS   Rhythm:Regular Rate:Normal     Neuro/Psych negative neurological ROS     GI/Hepatic negative GI ROS, Neg liver ROS,   Endo/Other  negative endocrine ROS  Renal/GU negative Renal ROS     Musculoskeletal   Abdominal   Peds  Hematology  (+) anemia ,   Anesthesia Other Findings   Reproductive/Obstetrics                            Lab Results  Component Value Date   WBC 7.0 03/08/2017   HGB 7.1 (L) 03/08/2017   HCT 23.9 (L) 03/08/2017   MCV 83.9 03/08/2017   PLT 285 03/08/2017   Lab Results  Component Value Date   CREATININE 1.21 03/08/2017   BUN 19 03/08/2017   NA 142 03/08/2017   K 4.0 03/08/2017   CL 115 (H) 03/08/2017   CO2 22 03/08/2017    Anesthesia Physical Anesthesia Plan  ASA: IV  Anesthesia Plan: MAC   Post-op Pain Management:    Induction: Intravenous  PONV Risk Score and Plan: 2 and Ondansetron, Propofol infusion and Treatment may vary due to age or medical condition  Airway Management Planned: Natural Airway and Nasal Cannula  Additional Equipment:   Intra-op Plan:   Post-operative Plan:   Informed Consent: I have reviewed the patients History and Physical, chart, labs and discussed the procedure including the risks, benefits and alternatives for the proposed anesthesia with the patient or authorized representative who has indicated his/her understanding and acceptance.     Plan Discussed with: CRNA  Anesthesia Plan Comments:         Anesthesia  Quick Evaluation

## 2017-03-09 NOTE — Brief Op Note (Signed)
03/07/2017 - 03/09/2017  12:28 PM  PATIENT:  Darren [name]  76 y.o. male  PRE-OPERATIVE DIAGNOSIS:  Melena, anemia  POST-OPERATIVE DIAGNOSIS:  gastric polyp biopsied; no  source of bleeding  PROCEDURE:  Procedure(s): ESOPHAGOGASTRODUODENOSCOPY (EGD) WITH PROPOFOL (Left)  SURGEON:  Surgeon(s) and Role:    Ronnette Juniper, MD - Primary  PHYSICIAN ASSISTANT:   ASSISTANTS: none   ANESTHESIA:   MAC  EBL:  Total I/O In: 120 [P.O.:120] Out: 300 [Urine:300]  BLOOD ADMINISTERED:none  DRAINS: none   LOCAL MEDICATIONS USED:  NONE  SPECIMEN:  Biopsy / Limited Resection  DISPOSITION OF SPECIMEN:  PATHOLOGY  COUNTS:  YES  TOURNIQUET:  * No tourniquets in log *  DICTATION: .Dragon Dictation  PLAN OF CARE: Admit to inpatient   PATIENT DISPOSITION:  PACU - hemodynamically stable.   Delay start of Pharmacological VTE agent (>24hrs) due to surgical blood loss or risk of bleeding: yes

## 2017-03-10 ENCOUNTER — Encounter (HOSPITAL_COMMUNITY): Payer: Self-pay | Admitting: *Deleted

## 2017-03-10 ENCOUNTER — Encounter (HOSPITAL_COMMUNITY)
Admission: EM | Disposition: A | Discharge status: Home / Self Care | Payer: Self-pay | Source: Home / Self Care | Attending: Internal Medicine

## 2017-03-10 HISTORY — PX: COLONOSCOPY: SHX5424

## 2017-03-10 LAB — TROPONIN I: Troponin I: <0.03 ng/mL (ref <0.03)

## 2017-03-10 LAB — HEMOGLOBIN AND HEMATOCRIT, BLOOD
HEMATOCRIT: 31.8 % — AB (ref 39.0–52.0)
HEMATOCRIT: 30.5 % — AB (ref 39.0–52.0)
HEMATOCRIT: 26.4 % — AB (ref 39.0–52.0)
HEMATOCRIT: 25.8 % — AB (ref 39.0–52.0)
HEMOGLOBIN: 9.3 g/dL — AB (ref 13.0–17.0)
HEMOGLOBIN: 9.1 g/dL — AB (ref 13.0–17.0)
HEMOGLOBIN: 7.9 g/dL — AB (ref 13.0–17.0)
HEMOGLOBIN: 7.5 g/dL — AB (ref 13.0–17.0)

## 2017-03-10 LAB — PREPARE RBC (CROSSMATCH)

## 2017-03-10 SURGERY — COLONOSCOPY
Anesthesia: Monitor Anesthesia Care | Laterality: Left

## 2017-03-10 MED ORDER — FENTANYL CITRATE (PF) 100 MCG/2ML IJ SOLN
INTRAMUSCULAR | Status: DC | PRN
  Administered 2017-03-10 (×2): 25 ug via INTRAVENOUS

## 2017-03-10 MED ORDER — FENTANYL CITRATE (PF) 100 MCG/2ML IJ SOLN
INTRAMUSCULAR | Status: AC
  Filled 2017-03-10: qty 2

## 2017-03-10 MED ORDER — MIDAZOLAM HCL 10 MG/2ML IJ SOLN
INTRAMUSCULAR | Status: DC | PRN
  Administered 2017-03-10 (×3): 1 mg via INTRAVENOUS

## 2017-03-10 MED ORDER — MIDAZOLAM HCL 5 MG/ML IJ SOLN
INTRAMUSCULAR | Status: AC
  Filled 2017-03-10: qty 2

## 2017-03-10 MED ORDER — SODIUM CHLORIDE 0.9 % IV SOLN: Freq: Once | INTRAVENOUS | Status: DC

## 2017-03-10 SURGICAL SUPPLY — 21 items

## 2017-03-10 NOTE — Interval H&P Note (Signed)
History and Physical Interval Note:  03/10/2017 10:47 AM  Darren [name]  has presented today for surgery, with the diagnosis of Anemia(Hb 4), black stools with unremarkable EGD  The various methods of treatment have been discussed with the patient and family. After consideration of risks, benefits and other options for treatment, the patient has consented to [place] as a surgical intervention .  The patient's history has been reviewed, patient examined, no change in status, stable for surgery.  I have reviewed the patient's chart and labs.  Questions were answered to the patient's satisfaction.     Darren [name]   Assessment:  1.  Dark stools. 2.  Anemia, negative endoscopy.  Plan:  1.  Colonoscopy. 2.  Risks (bleeding, infection, bowel perforation that could require surgery, sedation-related changes in cardiopulmonary systems), benefits (identification and possible treatment of source of symptoms, exclusion of certain causes of symptoms), and alternatives (watchful waiting, radiographic imaging studies, empiric medical treatment) of colonoscopy were explained to patient/family in detail and patient wishes to proceed.

## 2017-03-10 NOTE — Progress Notes (Signed)
RT Note:   Patient states he is going home tonight. So he will not be wearing CPAP

## 2017-03-10 NOTE — Op Note (Signed)
[facility] Patient Name: Darren [name] Procedure Date : 03/10/2017 MRN: [id number] Attending MD: Arta Silence , MD Date of Birth: March 15, 1941 CSN: [id number] Age: 76 Admit Type: Inpatient Procedure:                Colonoscopy Indications:              Melena, Unexplained iron deficiency anemia Providers:                Arta Silence, MD, Cleda Daub, RN, Cherylynn Ridges, Technician Referring MD:              Medicines:                Fentanyl 50 micrograms IV, Midazolam 4 mg IV Complications:            No immediate complications. Estimated Blood Loss:     Estimated blood loss: none. Procedure:                Pre-Anesthesia Assessment:                           - Prior to the procedure, a History and Physical                            was performed, and patient medications and                            allergies were reviewed. The patient's tolerance of                            previous anesthesia was also reviewed. The risks                            and benefits of the procedure and the sedation                            options and risks were discussed with the patient.                            All questions were answered, and informed consent                            was obtained. Prior Anticoagulants: The patient has                            taken Xarelto (rivaroxaban), last dose was 5 days                            prior to procedure. ASA Grade Assessment: III - A                            patient with severe systemic disease. After  reviewing the risks and benefits, the patient was                            deemed in satisfactory condition to undergo the                            procedure.                           After obtaining informed consent, the colonoscope                            was passed under direct vision. Throughout the                            procedure, the patient's  blood pressure, pulse, and                            oxygen saturations were monitored continuously. The                            EC-[id number] ([id number]) scope was introduced through                            the anus and advanced to the the cecum, identified                            by appendiceal orifice and ileocecal valve. The                            ileocecal valve, appendiceal orifice, and rectum                            were photographed. The entire colon was examined.                            The colonoscopy was performed without difficulty.                            The patient tolerated the procedure well. The                            quality of the bowel preparation was good. Scope In: 11:15:46 AM Scope Out: 11:29:55 AM Scope Withdrawal Time: 0 hours 12 minutes 25 seconds  Total Procedure Duration: 0 hours 14 minutes 9 seconds  Findings:      The perianal exam findings include non-thrombosed external hemorrhoids.      Internal hemorrhoids were found during retroflexion. The hemorrhoids       were moderate.      No additional abnormalities were found on retroflexion.      A few medium-mouthed diverticula were found in the transverse colon and       ascending colon.      Colon otherwise normal; no other polyps, masses, vascular ectasias, or       inflammatory changes were seen. Impression:               -  Non-thrombosed external hemorrhoids found on                            perianal exam.                           - Internal hemorrhoids.                           - Diverticulosis in the transverse colon and in the                            ascending colon.                           - No source of melena or anemia were identified on                            today's colonoscopy. Moderate Sedation:      Moderate (conscious) sedation was administered by the endoscopy nurse       and supervised by the endoscopist. The following parameters were       monitored:  oxygen saturation, heart rate, blood pressure, and response       to care. Recommendation:           - Return patient to hospital ward for ongoing care.                           - Soft diet today. Pre-admission diet starting                            tomorrow.                           - Resume Xarelto (rivaroxaban) at prior dose today.                           - Return to GI clinic in 2 weeks to see Dr.                            Cristina Gong. If recurrence of melena and anemia on                            rivaroxaban, would consider capsule endoscopy as                            next step in management.                           - Ok for patient to be discharged home today from                            GI perspective.                           [facility] will sign-off; please call with any  questions; thank you for the consultation.                           - Repeat colonoscopy (date not yet determined) for                            surveillance. Procedure Code(s):        --- Professional ---                           [phone], Colonoscopy, flexible; diagnostic, including                            collection of specimen(s) by brushing or washing,                            when performed (separate procedure) Diagnosis Code(s):        --- Professional ---                           K64.4, Residual hemorrhoidal skin tags                           K64.8, Other hemorrhoids                           K92.1, Melena (includes Hematochezia)                           D50.9, Iron deficiency anemia, unspecified                           K57.30, Diverticulosis of large intestine without                            perforation or abscess without bleeding CPT copyright 2016 [employer]. All rights reserved. The codes documented in this report are preliminary and upon coder review may  be revised to meet current compliance requirements. Arta Silence,  MD 03/10/2017 11:38:49 AM This report has been signed electronically. Number of Addenda: 0

## 2017-03-10 NOTE — H&P (View-Only) (Signed)
76 year old obese man with atrial fibrillation on diuretic therapy, hypertension, OSA, GERD who is presenting with dyspnea, black tarry stools, nausea, vomiting and generalized malaise. He was also on xarelto for atrial fibrillations. He was found to be severely anemic with a hemoglobin of 4 on arrival. He was admitted for further evaluation.    Plan:  Transfuse to keep hemoglobin greater than 7.  Anemia panel Post transfusion H&H afte rthe the 2 units of prbc later today.  EGD in am.    Hosie Poisson [phone]

## 2017-03-10 NOTE — Progress Notes (Signed)
Patient discharge home at 2100.  Discharge instructions reviewed with patient, sons, and wife.  All questions answered.  Dulera and eye drops went with patient.  All personal belongings sent with patient.  Earleen Reaper RN-BC, [employer]

## 2017-03-11 ENCOUNTER — Encounter (HOSPITAL_COMMUNITY): Payer: Self-pay | Admitting: Gastroenterology

## 2017-03-11 LAB — TYPE AND SCREEN
ABO/RH(D): O POS
Antibody Screen: NEGATIVE
UNIT DIVISION: 0
UNIT DIVISION: 0
UNIT DIVISION: 0
Unit division: 0
Unit division: 0

## 2017-03-11 LAB — BPAM RBC
BLOOD PRODUCT EXPIRATION DATE: 201809272359
BLOOD PRODUCT EXPIRATION DATE: 201809302359
BLOOD PRODUCT EXPIRATION DATE: 201810022359
Blood Product Expiration Date: 201809272359
Blood Product Expiration Date: 201809302359
ISSUE DATE / TIME: 201808292100
ISSUE DATE / TIME: 201808301436
ISSUE DATE / TIME: 201808300031
ISSUE DATE / TIME: 201808301045
ISSUE DATE / TIME: 201809011719
UNIT TYPE AND RH: 5100
UNIT TYPE AND RH: 5100
UNIT TYPE AND RH: 5100
UNIT TYPE AND RH: 5100
Unit Type and Rh: 5100

## 2017-03-12 NOTE — Discharge Summary (Signed)
Physician Discharge Summary  Darren [name] ZYS:[id number] DOB: March 12, 1941 DOA: 03/07/2017  PCP: [name], MD  Admit date: 03/07/2017 Discharge date: 03/10/2017  Admitted From: Home.  Disposition:Home.   Recommendations for Outpatient Follow-up:  1. Follow up with PCP in 1-2 weeks 2. Please obtain BMP/CBC in one week Please follow up with Dr Cristina Gong in 2 weeks.    Discharge Condition:stable.  CODE STATUS: full code.  Diet recommendation: Heart Healthy   Brief/Interim Summary: 76 year old obese man with atrial fibrillation on anticoagulation,  hypertension, OSA, GERD who is presenting with dyspnea, black tarry stools, nausea, vomiting and generalized malaise. He was also on xarelto for atrial fibrillation. He was found to be severely anemic with a hemoglobin of 4 on arrival. He was admitted for further evaluation. He underwent 5 units of prbc transfusion and hemoglobin improved to 7. GI consulted and he underwent EGD this am showing multiple sessile polyps in the fundus of the stomach, but no other abnormality was evident to explain his GI bleed. Colonoscopy was done, which was unremarkable except for internal and external hemorrhoids and diverticulosis.    Discharge Diagnoses:  Active Problems:   GI bleed  Iron deficiency anemia: anemia of chronic blood loss/ gi bleed; Admitted and underwent 5 units of prbc transfusion.  GI consulted and he underwent EGD this am showing multiple sessile polyps in the fundus of the stomach, but no other abnormality was evident to explain his GI bleed. Colonoscopy was done, which was unremarkable except for internal and external hemorrhoids and diverticulosis.   Anemia panel shows iron deficiency. IV iron ordered.    Hypertension well controlled.  GERD on PPI.   Chronic atrial fibrillation:  Rate controlled. Resume xarelto on discharge as per GI recommendations.      Discharge Instructions  Discharge Instructions    Diet - low  sodium heart healthy    Complete by:  As directed    Discharge instructions    Complete by:  As directed    Follow up with PCP on Tuesday for a CBC .  Please follow up with gastroenterology as recommended in 2 weeks.     Allergies as of 03/10/2017      Reactions   Penicillins Hives   Has patient had a PCN reaction causing immediate rash, facial/tongue/throat swelling, SOB or lightheadedness with hypotension: No Has patient had a PCN reaction causing severe rash involving mucus membranes or skin necrosis: No Has patient had a PCN reaction that required hospitalization: No Has patient had a PCN reaction occurring within the last 10 years: No If all of the above answers are "NO", then may proceed with Cephalosporin use.      Medication List    STOP taking these medications   famotidine 20 MG tablet Commonly known as:  PEPCID   montelukast 10 MG tablet Commonly known as:  SINGULAIR   OVER THE COUNTER MEDICATION     TAKE these medications   ALLOPURINOL PO Take 100 mg by mouth daily.   atorvastatin 40 MG tablet Commonly known as:  LIPITOR Take 40 mg by mouth daily.   COLCHICINE PO Take 1 tablet by mouth as needed.   colesevelam 625 MG tablet Commonly known as:  WELCHOL Take 625 mg by mouth 2 (two) times daily with a meal.   magnesium oxide 400 MG tablet Commonly known as:  MAG-OX Take 400 mg by mouth daily.   metoprolol tartrate 25 MG tablet Commonly known as:  LOPRESSOR Take 25 mg by mouth 2 (  two) times daily.   mometasone-formoterol 200-5 MCG/ACT Aero Commonly known as:  DULERA Inhale 2 puffs into the lungs 2 (two) times daily. What changed:  Another medication with the same name was removed. Continue taking this medication, and follow the directions you see here.   omeprazole 40 MG capsule Commonly known as:  PRILOSEC Take 1 capsule (40 mg total) by mouth daily.   PROAIR HFA 108 (90 Base) MCG/ACT inhaler Generic drug:  albuterol Inhale 2 puffs into the lungs  every 6 (six) hours as needed for wheezing or shortness of breath.   rivaroxaban 20 MG Tabs tablet Commonly known as:  XARELTO Take 20 mg by mouth daily with supper.   SYSTANE BALANCE 0.6 % Soln Generic drug:  Propylene Glycol As directed   thiamine 100 MG tablet Commonly known as:  VITAMIN B-1 Take 100 mg by mouth daily.   Vitamin D-3 1000 units Caps Take 1 capsule by mouth daily.            Discharge Care Instructions        Start     Ordered   03/10/17 0000  Diet - low sodium heart healthy     03/10/17 1829   03/10/17 0000  Discharge instructions    Comments:  Follow up with PCP on Tuesday for a CBC .  Please follow up with gastroenterology as recommended in 2 weeks.   03/10/17 [place]    [name], MD Follow up in 1 week(s).   Specialty:  Internal Medicine Why:  follow up with PCP in one week. post hospitalization.  Contact information: [address] [place] [address] [phone]          Allergies  Allergen Reactions  . Penicillins Hives    Has patient had a PCN reaction causing immediate rash, facial/tongue/throat swelling, SOB or lightheadedness with hypotension: No Has patient had a PCN reaction causing severe rash involving mucus membranes or skin necrosis: No Has patient had a PCN reaction that required hospitalization: No Has patient had a PCN reaction occurring within the last 10 years: No If all of the above answers are "NO", then may proceed with Cephalosporin use.    Consultations: GASTROENTEROLOGY.   Procedures/Studies:  No results found. EGD COLONOSCOPY.   Subjective: Wants to go home.   Discharge Exam: Vitals:   03/10/17 1929 03/10/17 2020  BP:  139/69  Pulse:  80  Resp:  18  Temp:  98 F (36.7 C)  SpO2: 97%    Vitals:   03/10/17 1715 03/10/17 1747 03/10/17 1929 03/10/17 2020  BP: (!) 143/62 (!) 133/59  139/69  Pulse: 65 76  80  Resp: 18 18  18   Temp: 98.9 F (37.2 C) 99.4 F (37.4  C)  98 F (36.7 C)  TempSrc: Oral Oral  Oral  SpO2: 99% 99% 97%   Weight:      Height:        General: Pt is alert, awake, not in acute distress Cardiovascular: RRR, S1/S2 +, no rubs, no gallops Respiratory: CTA bilaterally, no wheezing, no rhonchi Abdominal: Soft, NT, ND, bowel sounds + Extremities: no edema, no cyanosis    The results of significant diagnostics from this hospitalization (including imaging, microbiology, ancillary and laboratory) are listed below for reference.     Microbiology: No results found for this or any previous visit (from the past 240 hour(s)).   Labs: BNP (last 3 results) No results for input(s): BNP in the  last 8760 hours. Basic Metabolic Panel:  Recent Labs Lab 03/07/17 1946 03/08/17 0349  NA 139 142  K 4.0 4.0  CL 112* 115*  CO2 21* 22  GLUCOSE 144* 105*  BUN 22* 19  CREATININE 1.35* 1.21  CALCIUM 8.2* 8.1*   Liver Function Tests:  Recent Labs Lab 03/07/17 1946  AST 26  ALT 15*  ALKPHOS 70  BILITOT 0.6  PROT 5.1*  ALBUMIN 2.8*   No results for input(s): LIPASE, AMYLASE in the last 168 hours. No results for input(s): AMMONIA in the last 168 hours. CBC:  Recent Labs Lab 03/07/17 1946 03/08/17 0349  03/09/17 1603 03/10/17 0126 03/10/17 0245 03/10/17 1336 03/10/17 1510  WBC 7.7 7.0  --   --   --   --   --   --   HGB 3.8* 5.4*  < > 7.3* 9.3* 9.1* 7.9* 7.5*  HCT 14.0* 18.7*  < > 25.0* 31.8* 30.5* 26.4* 25.8*  MCV 83.3 83.9  --   --   --   --   --   --   PLT 296 285  --   --   --   --   --   --   < > = values in this interval not displayed. Cardiac Enzymes:  Recent Labs Lab 03/10/17 0126  TROPONINI <0.03   BNP: Invalid input(s): POCBNP CBG: No results for input(s): GLUCAP in the last 168 hours. D-Dimer No results for input(s): DDIMER in the last 72 hours. Hgb A1c No results for input(s): HGBA1C in the last 72 hours. Lipid Profile No results for input(s): CHOL, HDL, LDLCALC, TRIG, CHOLHDL, LDLDIRECT in the  last 72 hours. Thyroid function studies No results for input(s): TSH, T4TOTAL, T3FREE, THYROIDAB in the last 72 hours.  Invalid input(s): FREET3 Anemia work up No results for input(s): VITAMINB12, FOLATE, FERRITIN, TIBC, IRON, RETICCTPCT in the last 72 hours. Urinalysis    Component Value Date/Time   COLORURINE YELLOW 09/07/2008 1037   APPEARANCEUR CLEAR 09/07/2008 1037   LABSPEC 1.021 09/07/2008 1037   PHURINE 6.0 09/07/2008 1037   GLUCOSEU NEGATIVE 09/07/2008 1037   HGBUR NEGATIVE 09/07/2008 1037   BILIRUBINUR NEGATIVE 09/07/2008 1037   KETONESUR NEGATIVE 09/07/2008 1037   PROTEINUR NEGATIVE 09/07/2008 1037   UROBILINOGEN 0.2 09/07/2008 1037   NITRITE NEGATIVE 09/07/2008 1037   LEUKOCYTESUR  09/07/2008 1037    NEGATIVE MICROSCOPIC NOT DONE ON URINES WITH NEGATIVE PROTEIN, BLOOD, LEUKOCYTES, NITRITE, OR GLUCOSE <1000 mg/dL.   Sepsis Labs Invalid input(s): PROCALCITONIN,  WBC,  LACTICIDVEN Microbiology No results found for this or any previous visit (from the past 240 hour(s)).   Time coordinating discharge: Over 30 minutes  SIGNED:   Hosie Poisson, MD  [facility] 03/12/2017, 9:41 AM Pager   If 7PM-7AM, please contact night-coverage [email or web address] Password [email or web address]

## 2017-03-26 ENCOUNTER — Other Ambulatory Visit: Payer: Self-pay | Admitting: Physician Assistant

## 2017-03-26 DIAGNOSIS — K922 Gastrointestinal hemorrhage, unspecified: Secondary | ICD-10-CM

## 2017-03-26 DIAGNOSIS — D500 Iron deficiency anemia secondary to blood loss (chronic): Secondary | ICD-10-CM

## 2017-03-29 ENCOUNTER — Encounter: Payer: Self-pay | Admitting: Internal Medicine

## 2017-03-29 ENCOUNTER — Ambulatory Visit (INDEPENDENT_AMBULATORY_CARE_PROVIDER_SITE_OTHER): Payer: Medicare Other | Admitting: Internal Medicine

## 2017-03-29 VITALS — BP 120/72 | HR 64 | Ht 71.0 in | Wt 241.6 lb

## 2017-03-29 DIAGNOSIS — E669 Obesity, unspecified: Secondary | ICD-10-CM | POA: Diagnosis not present

## 2017-03-29 DIAGNOSIS — J45991 Cough variant asthma: Secondary | ICD-10-CM | POA: Diagnosis not present

## 2017-03-29 NOTE — Progress Notes (Signed)
Subjective:   Patient ID: Darren [name], male    DOB: 09/28/1940,   MRN: [id number]    Brief patient profile:  27  yowm quit smoking 1990 with cough that resolved and pattern of recurrent rhinitis since childhood spring > fall with onset of recurrent flares of cough / wheezing around 2005 resolves from days to weeks p prednisone cycle needing up to 4 x on avg  referred to pulmonary clinic 03/30/2016 by Dr   Dr Mellody Drown    History of Present Illness  03/30/2016 1st [facility] office visit/ Chou Busler   Chief Complaint  Patient presents with  . Pulmonary Consult    Referred by Dr. [name]. Pt c/o cough and wheezing off and on "for years".    last prednisone was 15 days one week prior to OV  Back to baseline = doe x MMRC1 = can walk nl pace, flat grade, can't hurry or go uphills or steps s sob  On proair once each am  rec Plan A = Automatic = Symbicort 80 Take 2 puffs first thing in am and then another 2 puffs about 12 hours later.  Plan B = Backup Only use your albuterol as a rescue medication     04/07/16 insurance req change to advair 115   05/10/2016  f/u ov/Briteny Fulghum re: cough flare on advair 115 hfa 2 bid  Chief Complaint  Patient presents with  . Follow-up    Cough had improved but then worsened again 2 wks ago. He is no longer wheezing. Cough is rarely prod, but in the am's he coughs up brown sputum.    better for at least for a few weeks then gradually worse ? Due to change to advair ? Due to flare of fall rhinitis Doe continues MMRC1 rec Stop advair Plan A = Automatic = symbicort 80 Take 2 puffs first thing in am and then another 2 puffs about 12 hours later.  Work on 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 Follow-up    Cough comes and goes. His cough is occ prod with clear sputum.    worked out at y earlier today and overall much better breathing/ less cough than on advair  Main issue is cost/ insurance coverage for symbicort / never needs saba now  rec Plan A = Automatic = symbicort 80 = dulera 100 Take 2 puffs first thing in am and then another 2 puffs about 12 hours later.  Plan B = Backup Only use your albuterol Proair) as a rescue medication    09/06/2016  f/u ov/Devlynn Knoff re:  AB/ dulera 100 2bid and prilosec ac only / no saba  Chief Complaint  Patient presents with  . Follow-up    3 month follow up. Breathing has been ok since last visit. Cough has improved since last visit. Denies SOB or chest pain.   cough is some better but  the only time completely free of "congestion" sensation  Is while  on prednisone but never produces purulent  secretions Nose drips after shower>> only  watery  prilosec 20 mg ac  Never wakes him up even when cough flares/ cpap works fine  rec Plan A = Automatic = dulera 200 Take 2 puffs first thing in am and then another 2 puffs about 12 hours later to see what difference it makes Also increase prilosec to 40 mg Take 30-60 min before first meal of the day and pepcid 20 mg at bedtime  Plan B = Backup Only use your albuterol as a rescue medication   GERD diet     11/28/2016  f/u ov/Cleotilde Spadaccini re: AB/ rhinitis chronic cough no better on gerd rx / shaky on dulera 200 2bid Chief Complaint  Patient presents with  . Follow-up    Cough has been worse x 2 wks and is occ prod with clear sputum. He has not had to use albuterol.   no change on gerd rx/ breathing overall better  Sense of pnds not using any otcs rec Ok to try dulera 200 one twice daily  Ok to try off the reflux meds  For now but continue diet and start back on the acid suppression if cough flares  If worse > Prednisone 10 mg take   4 each am x 2 days,   2 each am x 2 days,  1 each am x 2 days and stop  For nasal drainage > zyrtec 10 mg at bedtime as needed Please see patient coordinator before you leave today  to schedule sinus CT     01/09/2017  f/u ov/Kattia Selley re:  dulera 200 one bid and prilosec 20 mg daily/ did not follow any of the contingency recs  Chief Complaint  Patient presents with  . Follow-up    Pt states that he is here to get work on his wheezing and coughing. Pt went on vacation and stated when got out of [place] heading toward [place], the coughing became better. Once arrived back in [place], cough and wheezing became worse again. Denies chest pain. Pt is getting ready to begin a new neuropathy treatment.   not using albuterol at all as only using for breathing  Doing fine on cpap s cough / sob  Doe = MMRC2 = can't walk a nl pace on a flat grade s sob but does fine slow and flat  rec First try dulera 200  Take 2 puffs first thing in am and then another 2 puffs about 12 hours later.  If not better add singulair 10 mg daily after 1 week of the higher dose dulera  Whenever cough is out of control go ahead and use prilosec Take 30- 60 min before your first and last meals of the day until cough is better For drainage / throat tickle try take CHLORPHENIRAMINE  4 mg - take one every 4 hours as needed - available over the counter- may cause drowsiness so start with just a bedtime dose or two and see how you tolerate it before trying in daytime  (if too drowsy ok to use zyrtec)  If all fails > take Prednisone 10 mg take  4 each am x 2 days,   2 each am x 2 days,  1 each am x 2 days and stop  GERD diet      03/29/2017  f/u ov/Aniylah Avans re:  AB/ rhinitis chronic cough no better on gerd rx/ dulera 200 2bid with min tremor  Chief Complaint  Patient presents with  . Follow-up    He still  has occ cough and PND. Breathing is overall doing well. He has never had to use his albuterol.   singulair started 2 weeks and helping pnds Sleeping  ok on cpap Dr Nadyne Coombes Doe = [facility] = can't walk a nl pace on a flat grade s sob but does fine slow pace and flat eg around the block even some slt inclines/ overall improved   No obvious day to day or daytime variability or assoc excess/ purulent sputum or mucus plugs or hemoptysis or cp or chest tightness, subjective wheeze or overt   hb symptoms. No unusual exp hx or h/o childhood pna/ asthma or knowledge of premature birth.  Sleeping ok flat without nocturnal  or early am exacerbation  of respiratory  c/o's or need for noct saba. Also denies any obvious fluctuation of symptoms with weather or environmental changes or other aggravating or alleviating factors except as outlined above   Current Allergies, Complete Past Medical History, Past Surgical History, Family History, and Social History were reviewed in [employer] record.  ROS  The following are not active complaints unless bolded sore throat, dysphagia, dental problems, itching, sneezing,  nasal congestion or disharge of excess mucus or purulent secretions, ear ache,   fever, chills, sweats, unintended wt loss or wt gain, classically pleuritic or exertional cp,  orthopnea pnd or leg swelling improving , presyncope, palpitations, abdominal pain, anorexia, nausea, vomiting, diarrhea  or change in bowel habits or bladder habits, change in stools or change in urine, dysuria, hematuria,  rash, arthralgias, visual complaints, headache, numbness, weakness or ataxia or problems with walking or coordination,  change in mood/affect or memory.        Current Meds  Medication Sig  . albuterol (PROAIR HFA) 108 (90 Base) MCG/ACT inhaler Inhale 2 puffs into the lungs every 6 (six) hours as needed for wheezing or shortness of breath.  . colesevelam (WELCHOL) 625 MG tablet Take 625 mg by mouth 2 (two) times daily with a meal.  . Iron-FA-B Cmp-C-Biot-Probiotic (FUSION PLUS) CAPS Take 1 capsule by mouth daily.  [name]  L-Methylfolate-Algae-B12-B6 (METANX) 3-90.314-2-35 MG CAPS Take 1 tablet by mouth daily.  . magnesium oxide (MAG-OX) 400 MG tablet Take 400 mg by mouth daily.  . metoprolol tartrate (LOPRESSOR) 25 MG tablet Take 25 mg by mouth 2 (two) times daily.  . mometasone-formoterol (DULERA) 200-5 MCG/ACT AERO Inhale 2 puffs into the lungs 2 (two) times daily.  . montelukast (SINGULAIR) 10 MG tablet Take 10 mg by mouth at bedtime.  [name] olmesartan-hydrochlorothiazide (BENICAR HCT) 40-12.5 MG tablet Take 1 tablet by mouth daily.  [name] omeprazole (PRILOSEC) 20 MG capsule Take 20 mg by mouth 2 (two) times daily before a meal.  . Propylene Glycol (SYSTANE BALANCE) 0.6 % SOLN As directed  . thiamine (VITAMIN B-1) 100 MG tablet Take 100 mg by mouth daily.  . [DISCONTINUED] omeprazole (PRILOSEC) 40 MG capsule Take 1 capsule (40 mg total) by mouth daily.                     Objective:   Physical Exam  amb obese wm nad     03/29/2017        241 01/09/2017          263  11/28/2016        267  09/06/16         273   06/07/16 267 lb (121.1 kg)  05/10/16 264 lb (119.7 kg)  03/30/16 261 lb (118.4 kg)  Vital signs reviewed -  - Note on arrival 02 sats  97% on RA       HEENT: nl dentition,  and oropharynx. Nl external ear canals without cough reflex -  moderate bilateral non-specific turbinate edema   - Modified Mallampati Score =   3/4   NECK :  without JVD/Nodes/TM/ nl carotid upstrokes bilaterally   LUNGS: no acc muscle use,  Nl contour chest with  Completely clear bilaterally to A and P   CV:  RRR  no s3 or murmur or increase in P2 - 1+ bilateral lower ext pitting edema   ABD:  Quite obese but soft and nontender with nl inspiratory excursion in the supine position. No bruits or organomegaly, bowel sounds nl  MS:  Nl gait/ ext warm without deformities, calf tenderness, cyanosis or clubbing No obvious joint restrictions   SKIN: warm and dry without lesions    NEURO:  alert, approp, nl sensorium  with  no motor deficits/ minimal resting tremor both hands             Assessment & Plan:

## 2017-03-29 NOTE — Assessment & Plan Note (Addendum)
Body mass index is 33.7 kg/m.  -  trending down / encouraged  No results found for: TSH   Complicated by hbp/ osa /hyperlipidemia  And Contributing to gerd risk/ doe/reviewed the need and the process to achieve and maintain neg calorie balance > defer f/u primary care including intermittently monitoring thyroid status

## 2017-03-29 NOTE — Assessment & Plan Note (Signed)
FENO 03/30/2016  =   47 on just saba  - 03/30/2016  After extensive coaching HFA effectiveness =    90% :  Try symbicort 80 2bid x 6 weeks then return with repeat spirometry >  04/06/2016 notified insurance prefers advair > try 115 hfa 2 bid   - Allergy profile 05/10/2016 >  Eos 0.4 /  IgE 60 Pos Grass/ cedar trees   - 05/10/2016 changed back to 4 weeks of symb 80 p flared on advair - FENO 06/07/2016  =   10 on symb 80 2bid  - Spirometry 06/07/2016  FEV1 1.89 (58%)  Ratio  71   - 09/06/2016  After extensive coaching HFA effectiveness =    90% try dulera 200 2bid - 11/28/2016 excess shaking on duelra 200 2bid so try one bid and off gerd rx since is not convinced worked - Sinus CT 11/29/2016 >>> Mucosal edema in the maxillary and ethmoid sinuses bilaterally. No air-fluid level.  - Singulair 10 mg started first week in Sept 2018 > improving  Even in retrospect it's hard to sort out which of his symptoms are due to asthma vs uacs plus obesity deconditioning with a combination of cough and doe but he's doing so much better now that there's no reason to change rx - since he has dulera 100 and 200 ok to try to use up the 100 as long as he remembers 2 q 12 h is the max dose of either  I had an extended discussion with the patient reviewing all relevant studies completed to date and  lasting 15 to 20 minutes of a 25 minute visit    Each maintenance medication was reviewed in detail including most importantly the difference between maintenance and prns and under what circumstances the prns are to be triggered using an action plan format that is not reflected in the computer generated alphabetically organized AVS.    Please see AVS for specific instructions unique to this visit that I personally wrote and verbalized to the the pt in detail and then reviewed with pt  by my nurse highlighting any  changes in therapy recommended at today's visit to their plan of care.

## 2017-03-29 NOTE — Patient Instructions (Addendum)
No change in medications - ok to substitute the dulera 100 for the 200 but regardless the dose is no more than Take 2 puffs first thing in am and then another 2 puffs about 12 hours later.     Be sure omeprazole 40mg  Take 30- 60 min before your first and last meals of the day   Please schedule a follow up visit in 3 months but call sooner if needed

## 2017-03-30 ENCOUNTER — Ambulatory Visit
Admission: RE | Admit: 2017-03-30 | Discharge: 2017-03-30 | Disposition: A | Discharge status: Home / Self Care | Payer: Medicare Other | Source: Ambulatory Visit | Attending: Physician Assistant | Admitting: Physician Assistant

## 2017-03-30 DIAGNOSIS — K922 Gastrointestinal hemorrhage, unspecified: Secondary | ICD-10-CM

## 2017-03-30 DIAGNOSIS — D500 Iron deficiency anemia secondary to blood loss (chronic): Secondary | ICD-10-CM

## 2017-03-30 MED ORDER — IOPAMIDOL (ISOVUE-300) INJECTION 61%
125.0000 mL | Freq: Once | INTRAVENOUS | Status: AC | PRN
  Administered 2017-03-30: 125 mL via INTRAVENOUS

## 2017-05-15 ENCOUNTER — Other Ambulatory Visit: Payer: Self-pay | Admitting: Physician Assistant

## 2017-06-28 ENCOUNTER — Ambulatory Visit: Payer: Medicare Other | Admitting: Internal Medicine

## 2017-07-17 ENCOUNTER — Encounter: Payer: Self-pay | Admitting: Internal Medicine

## 2017-07-17 ENCOUNTER — Ambulatory Visit (INDEPENDENT_AMBULATORY_CARE_PROVIDER_SITE_OTHER): Payer: Medicare Other | Admitting: Internal Medicine

## 2017-07-17 VITALS — BP 128/78 | HR 60 | Ht 71.0 in | Wt 257.6 lb

## 2017-07-17 DIAGNOSIS — J45991 Cough variant asthma: Secondary | ICD-10-CM

## 2017-07-17 MED ORDER — MOMETASONE FURO-FORMOTEROL FUM 100-5 MCG/ACT IN AERO: 2.0000 | INHALATION_SPRAY | Freq: Two times a day (BID) | RESPIRATORY_TRACT | 0 refills | Status: DC

## 2017-07-17 NOTE — Assessment & Plan Note (Addendum)
FENO 03/30/2016  =   47 on just saba  - 03/30/2016  After extensive coaching HFA effectiveness =    90% :  Try symbicort 80 2bid x 6 weeks then return with repeat spirometry >  04/06/2016 notified insurance prefers advair > try 115 hfa 2 bid   - Allergy profile 05/10/2016 >  Eos 0.4 /  IgE 60 Pos Grass/ cedar trees   - 05/10/2016 changed back to 4 weeks of symb 80 p flared on advair - FENO 06/07/2016  =   10 on symb 80 2bid  - Spirometry 06/07/2016  FEV1 1.89 (58%)  Ratio  71   - 09/06/2016  After extensive coaching HFA effectiveness =    90% try dulera 200 2bid - 11/28/2016 excess shaking on duelra 200 2bid so try one bid and off gerd rx since is not convinced worked - Sinus CT 11/29/2016 >>> Mucosal edema in the maxillary and ethmoid sinuses bilaterally. No air-fluid level. - Singulair 10 mg started first week in Sept 2018   - 07/17/2017  After extensive coaching inhaler device  effectiveness =    90% > rechallenge with the dulera 100   All goals of chronic asthma control met including optimal function and elimination of symptoms with minimal need for rescue therapy so ok to try step down to dulera 100/ maint singulair as pt convinced this helped the cough/sob (though maybe not the pnds)   Contingencies discussed in full including contacting this office immediately if not controlling the symptoms using the rule of two's.     Will try zyrtec for pnds and if not better 1st gen H1 blockers per guidelines    I had an extended discussion with the patient reviewing all relevant studies completed to date and  lasting 15 to 20 minutes of a 25 minute visit    Each maintenance medication was reviewed in detail including most importantly the difference between maintenance and prns and under what circumstances the prns are to be triggered using an action plan format that is not reflected in the computer generated alphabetically organized AVS.    Please see AVS for specific instructions unique to this visit that  I personally wrote and verbalized to the the pt in detail and then reviewed with pt  by my nurse highlighting any  changes in therapy recommended at today's visit to their plan of care.

## 2017-07-17 NOTE — Patient Instructions (Addendum)
Try zyrtec 10 mg at bedtime as needed for drippy nose  (available over the counter) - also can try For drainage / throat tickle try take CHLORPHENIRAMINE  4 mg - take one every 4 hours as needed - available over the counter- may cause excess  drowsiness so start with just a bedtime dose or two and see how you tolerate it before trying in daytime    Try dulera 100 Take 2 puffs first thing in am and then another 2 puffs about 12 hours later.   Work on maintaining perfect  inhaler technique:  relax and gently blow all the way out then take a nice smooth deep breath back in, triggering the inhaler at same time you start breathing in.  Hold for up to 5 seconds if you can. Blow out thru nose. Rinse and gargle with water when done       Please schedule a follow up visit in 3 months but call sooner if needed

## 2017-07-17 NOTE — Progress Notes (Signed)
Subjective:   Patient ID: Darren [name], male    DOB: 12/16/1940,   MRN: [id number]    Brief patient profile:  14  yowm quit smoking 1990 with cough that resolved and pattern of recurrent rhinitis since childhood spring > fall with onset of recurrent flares of cough / wheezing around 2005 resolves from days to weeks p prednisone cycle needing up to 4 x on avg  referred to pulmonary clinic 03/30/2016 by Dr   Dr Mellody Drown    History of Present Illness  03/30/2016 1st [facility] office visit/ Rosena Bartle   Chief Complaint  Patient presents with  . Pulmonary Consult    Referred by Dr. [name]. Pt c/o cough and wheezing off and on "for years".    last prednisone was 15 days one week prior to OV  Back to baseline = doe x MMRC1 = can walk nl pace, flat grade, can't hurry or go uphills or steps s sob  On proair once each am  rec Plan A = Automatic = Symbicort 80 Take 2 puffs first thing in am and then another 2 puffs about 12 hours later.  Plan B = Backup Only use your albuterol as a rescue medication     04/07/16 insurance req change to advair 115   05/10/2016  f/u ov/Khalel Alms re: cough flare on advair 115 hfa 2 bid  Chief Complaint  Patient presents with  . Follow-up    Cough had improved but then worsened again 2 wks ago. He is no longer wheezing. Cough is rarely prod, but in the am's he coughs up brown sputum.    better for at least for a few weeks then gradually worse ? Due to change to advair ? Due to flare of fall rhinitis Doe continues MMRC1 rec Stop advair Plan A = Automatic = symbicort 80 Take 2 puffs first thing in am and then another 2 puffs about 12 hours later.  Work on [employer]  inhaler technique:   Prednisone 10 mg take  4 each am x 2 days,   2 each am x 2 days,  1 each am x 2 days and stop  Plan B = Backup Only use your albuterol as a rescue medication  4 weeks symb samples to sort out whether sym 80 works vs advair 115      06/07/2016  f/u ov/Eulises Kijowski re:  AB  with ?  uacs component  On dulera 100 2bid  Chief Complaint  Patient presents with  . Follow-up    Cough comes and goes. His cough is occ prod with clear sputum.    worked out at y earlier today and overall much better breathing/ less cough than on advair  Main issue is cost/ insurance coverage for symbicort / never needs saba now  rec Plan A = Automatic = symbicort 80 = dulera 100 Take 2 puffs first thing in am and then another 2 puffs about 12 hours later.  Plan B = Backup Only use your albuterol Proair) as a rescue medication    09/06/2016  f/u ov/Klover Priestly re:  AB/ dulera 100 2bid and prilosec ac only / no saba  Chief Complaint  Patient presents with  . Follow-up    3 month follow up. Breathing has been ok since last visit. Cough has improved since last visit. Denies SOB or chest pain.   cough is some better but  the only time completely free of "congestion" sensation  Is while  on prednisone but never  produces purulent secretions Nose drips after shower>> only  watery  prilosec 20 mg ac  Never wakes him up even when cough flares/ cpap works fine  rec Plan A = Automatic = dulera 200 Take 2 puffs first thing in am and then another 2 puffs about 12 hours later to see what difference it makes Also increase prilosec to 40 mg Take 30-60 min before first meal of the day and pepcid 20 mg at bedtime  Plan B = Backup Only use your albuterol as a rescue medication   GERD diet     11/28/2016  f/u ov/Bethanie Bloxom re: AB/ rhinitis chronic cough no better on gerd rx / shaky on dulera 200 2bid Chief Complaint  Patient presents with  . Follow-up    Cough has been worse x 2 wks and is occ prod with clear sputum. He has not had to use albuterol.   no change on gerd rx/ breathing overall better  Sense of pnds not using any otcs rec Ok to try dulera 200 one twice daily  Ok to try off the reflux meds  For now but continue diet and start back on the acid suppression if cough flares  If worse > Prednisone 10 mg take   4 each am x 2 days,   2 each am x 2 days,  1 each am x 2 days and stop  For nasal drainage > zyrtec 10 mg at bedtime as needed Please see patient coordinator before you leave today  to schedule sinus CT     01/09/2017  f/u ov/Elhadji Pecore re:  dulera 200 one bid and prilosec 20 mg daily/ did not follow any of the contingency recs  Chief Complaint  Patient presents with  . Follow-up    Pt states that he is here to get work on his wheezing and coughing. Pt went on vacation and stated when got out of [place] heading toward [place], the coughing became better. Once arrived back in [place], cough and wheezing became worse again. Denies chest pain. Pt is getting ready to begin a new neuropathy treatment.   not using albuterol at all as only using for breathing  Doing fine on cpap s cough / sob  Doe = MMRC2 = can't walk a nl pace on a flat grade s sob but does fine slow and flat  rec First try dulera 200  Take 2 puffs first thing in am and then another 2 puffs about 12 hours later.  If not better add singulair 10 mg daily after 1 week of the higher dose dulera  Whenever cough is out of control go ahead and use prilosec Take 30- 60 min before your first and last meals of the day until cough is better For drainage / throat tickle try take CHLORPHENIRAMINE  4 mg - take one every 4 hours as needed - available over the counter- may cause drowsiness so start with just a bedtime dose or two and see how you tolerate it before trying in daytime  (if too drowsy ok to use zyrtec)  If all fails > take Prednisone 10 mg take  4 each am x 2 days,   2 each am x 2 days,  1 each am x 2 days and stop  GERD diet      03/29/2017  f/u ov/Didier Brandenburg re:  AB/ rhinitis chronic cough no better on gerd rx/ dulera 200 2bid with min tremor  Chief Complaint  Patient presents with  . Follow-up  He still has occ cough and PND. Breathing is overall doing well. He has never had to use his albuterol.   singulair started 2 weeks and helping pnds Sleeping  ok on cpap Dr Nadyne Coombes Doe = [facility] = can't walk a nl pace on a flat grade s sob but does fine slow pace and flat eg around the block even some slt inclines/ overall improved  rec No change in medications - ok to substitute the dulera 100 for the 200 but regardless the dose is no more than Take 2 puffs first thing in am and then another 2 puffs about 12 hours later.  Be sure omeprazole 40mg  Take 30- 60 min before your first and last meals of the day      07/17/2017  f/u ov/Ranell Skibinski re:  AB/ rhinitis with pnds is main concern Chief Complaint  Patient presents with  . Follow-up    Pt states still having to clear his throat, worse over the past 2 months.   cough does not wake him up/ sleeps ok on cpap and w/in an hour feels urge to clear throat but even then < a tbsp of mucus  clariton not effective, has not tried zyrtec or 1st gen H1 yet  Doe still = MMRC2 / not needing saba   No obvious day to day or daytime variability or assoc excess/ purulent sputum or mucus plugs or hemoptysis or cp or chest tightness, subjective wheeze or overt sinus or hb symptoms. No unusual exposure hx or h/o childhood pna/ asthma or knowledge of premature birth.  Sleeping ok flat without nocturnal  or early am exacerbation  of respiratory  c/o's or need for noct saba. Also denies any obvious fluctuation of symptoms with weather or environmental changes or other aggravating or alleviating factors except as outlined above   Current Allergies, Complete Past Medical History, Past Surgical History, Family History, and Social History were reviewed in [employer] record.  ROS  The following are not active complaints unless bolded Hoarseness, sore throat, dysphagia, dental problems, itching, sneezing,  nasal congestion or discharge of excess mucus or purulent secretions, ear ache,   fever, chills, sweats, unintended wt loss or wt gain, classically pleuritic or exertional cp,  orthopnea pnd or leg swelling,  presyncope, palpitations, abdominal pain, anorexia, nausea, vomiting, diarrhea  or change in bowel habits or change in bladder habits, change in stools or change in urine, dysuria, hematuria,  rash, arthralgias, visual complaints, headache, numbness, weakness or ataxia or problems with walking or coordination,  change in mood/affect or memory.        Current Meds  Medication Sig  . albuterol (PROAIR HFA) 108 (90 Base) MCG/ACT inhaler Inhale 2 puffs into the lungs every 6 (six) hours as needed for wheezing or shortness of breath.  . ALLOPURINOL PO Take 100 mg by mouth daily.   . Cholecalciferol (VITAMIN D-3) 1000 units CAPS Take 1 capsule by mouth daily.  . COLCHICINE PO Take 1 tablet by mouth as needed.   . colesevelam (WELCHOL) 625 MG tablet Take 625 mg by mouth 2 (two) times daily with a meal.  . Iron-FA-B Cmp-C-Biot-Probiotic (FUSION PLUS) CAPS Take 1 capsule by mouth daily.  [name] L-Methylfolate-Algae-B12-B6 (METANX) 3-90.314-2-35 MG CAPS Take 1 tablet by mouth daily.  . magnesium oxide (MAG-OX) 400 MG tablet Take 400 mg by mouth daily.  . metoprolol tartrate (LOPRESSOR) 25 MG tablet Take 25 mg by mouth 2 (two) times daily.  . montelukast (SINGULAIR) 10 MG tablet  Take 10 mg by mouth at bedtime.  [name] olmesartan-hydrochlorothiazide (BENICAR HCT) 40-12.5 MG tablet Take 1 tablet by mouth daily.  [name] omeprazole (PRILOSEC) 20 MG capsule Take 20 mg by mouth 2 (two) times daily before a meal.  . Propylene Glycol (SYSTANE BALANCE) 0.6 % SOLN As directed  . thiamine (VITAMIN B-1) 100 MG tablet Take 100 mg by mouth daily.  . [  mometasone-formoterol (DULERA) 200-5 MCG/ACT AERO Inhale 2 puffs into the lungs 2 (two) times daily.                   Objective:   Physical Exam  amb obese slt hoarse  wm nad   07/17/2017          258  03/29/2017        241 01/09/2017          263  11/28/2016        267  09/06/16         273   06/07/16 267 lb (121.1 kg)  05/10/16 264 lb (119.7 kg)  03/30/16 261 lb  (118.4 kg)       Vital signs reviewed - Note on arrival 02 sats  97% on RA      HEENT: nl dentition,   and oropharynx. Nl external ear canals without cough reflex  - moderate bilateral non-specific turbinate edema     NECK :  without JVD/Nodes/TM/ nl carotid upstrokes bilaterally   LUNGS: no acc muscle use,  Nl contour chest which is clear to A and P bilaterally without cough on insp or exp maneuvers   CV:  RRR  no s3 or murmur or increase in P2, and trace sym bilateral lower ext pitting edema   ABD:  Tensely obese but  nontender with limited inspiratory excursion in the supine position. No bruits or organomegaly appreciated, bowel sounds nl  MS:  Nl gait/ ext warm without deformities, calf tenderness, cyanosis or clubbing No obvious joint restrictions   SKIN: warm and dry without lesions    NEURO:  alert, approp, nl sensorium with  no motor or cerebellar deficits apparent.              Assessment & Plan:

## 2017-08-06 ENCOUNTER — Other Ambulatory Visit: Payer: Self-pay | Admitting: Internal Medicine

## 2017-08-06 DIAGNOSIS — J45991 Cough variant asthma: Secondary | ICD-10-CM

## 2018-01-15 ENCOUNTER — Ambulatory Visit: Payer: Medicare Other | Admitting: Internal Medicine

## 2018-01-17 ENCOUNTER — Ambulatory Visit: Payer: Medicare Other | Admitting: Internal Medicine

## 2018-01-18 ENCOUNTER — Other Ambulatory Visit: Payer: Self-pay | Admitting: Internal Medicine

## 2018-01-21 ENCOUNTER — Ambulatory Visit (INDEPENDENT_AMBULATORY_CARE_PROVIDER_SITE_OTHER): Payer: Medicare Other | Admitting: Internal Medicine

## 2018-01-21 ENCOUNTER — Encounter: Payer: Self-pay | Admitting: Internal Medicine

## 2018-01-21 VITALS — BP 130/82 | HR 63 | Ht 71.0 in | Wt 265.0 lb

## 2018-01-21 DIAGNOSIS — J45991 Cough variant asthma: Secondary | ICD-10-CM

## 2018-01-21 DIAGNOSIS — J310 Chronic rhinitis: Secondary | ICD-10-CM | POA: Diagnosis not present

## 2018-01-21 MED ORDER — CEFDINIR 300 MG PO CAPS: 300.0000 mg | ORAL_CAPSULE | Freq: Two times a day (BID) | ORAL | 0 refills | Status: DC

## 2018-01-21 NOTE — Patient Instructions (Addendum)
Omnicef 300 mg twice daily x 10 days  Please schedule a follow up visit in 6 months but call sooner if needed

## 2018-01-21 NOTE — Assessment & Plan Note (Addendum)
FENO 03/30/2016  =   47 on just saba  - 03/30/2016  After extensive coaching HFA effectiveness =    90% :  Try symbicort 80 2bid x 6 weeks then return with repeat spirometry >  04/06/2016 notified insurance prefers advair > try 115 hfa 2 bid   - Allergy profile 05/10/2016 >  Eos 0.4 /  IgE 60 Pos Grass/ cedar trees   - 05/10/2016 changed back to 4 weeks of symb 80 p flared on advair - FENO 06/07/2016  =   10 on symb 80 2bid  - Spirometry 06/07/2016  FEV1 1.89 (58%)  Ratio  71   - 09/06/2016  After extensive coaching HFA effectiveness =    90% try dulera 200 2bid - 11/28/2016 excess shaking on duelra 200 2bid so try one bid and off gerd rx since is not convinced worked - Sinus CT 11/29/2016 >>> Mucosal edema in the maxillary and ethmoid sinuses bilaterally. No air-fluid level. - Singulair 10 mg started first week in Sept 2018  - 07/17/2017   rechallenge with the dulera 100   The proper method of use, as well as anticipated side effects, of a metered-dose inhaler are discussed and demonstrated to the patient.   Despite  acute flare of rhinitis / sinusitis :  All goals of chronic asthma control met including optimal function and elimination of symptoms with minimal need for rescue therapy.  Contingencies discussed in full including contacting this office immediately if not controlling the symptoms using the rule of two's.     I had an extended discussion with the patient reviewing all relevant studies completed to date and  lasting 15 to 20 minutes of a 25 minute visit    See device teaching which extended face to face time for this visit.  Each maintenance medication was reviewed in detail including emphasizing most importantly the difference between maintenance and prns and under what circumstances the prns are to be triggered using an action plan format that is not reflected in the computer generated alphabetically organized AVS which I have not found useful in most complex patients, especially with  respiratory illnesses  Please see AVS for specific instructions unique to this visit that I personally wrote and verbalized to the the pt in detail and then reviewed with pt  by my nurse highlighting any  changes in therapy recommended at today's visit to their plan of care.

## 2018-01-21 NOTE — Progress Notes (Signed)
Subjective:   Patient ID: Darren [name], male    DOB: [birth year],   MRN: [id number]    Brief patient profile:  9  yowm quit smoking 1990 with cough that resolved and pattern of recurrent rhinitis since childhood spring > fall with onset of recurrent flares of cough / wheezing around 2005 resolves from days to weeks p prednisone cycle needing up to 4 x on avg  referred to pulmonary clinic 03/30/2016 by Dr   Dr Mellody Drown    History of Present Illness  03/30/2016 1st [facility] office visit/ [name]   Chief Complaint  Patient presents with  . Pulmonary Consult    Referred by Dr. [name]. Pt c/o cough and wheezing off and on "for years".    last prednisone was 15 days one week prior to OV  Back to baseline = doe x MMRC1 = can walk nl pace, flat grade, can't hurry or go uphills or steps s sob  On proair once each am  rec Plan A = Automatic = Symbicort 80 Take 2 puffs first thing in am and then another 2 puffs about 12 hours later.  Plan B = Backup Only use your albuterol as a rescue medication            07/17/2017  f/u ov/[name] re:  AB/ rhinitis with pnds is main concern Chief Complaint  Patient presents with  . Follow-up    Pt states still having to clear his throat, worse over the past 2 months.   cough does not wake him up/ sleeps ok on cpap and w/in an hour feels urge to clear throat but even then < a tbsp of mucus  clariton not effective, has not tried zyrtec or 1st gen H1 yet  Doe still = MMRC2 / not needing saba  rec Try zyrtec 10 mg at bedtime as needed for drippy nose  (available over the counter) - also can try For drainage / throat tickle try take CHLORPHENIRAMINE  4 mg - take one every 4 hours as needed -   Try dulera 100 Take 2 puffs first thing in am and then another 2 puffs about 12 hours later.  Work on maintaining perfect  inhaler technique:    01/21/2018  f/u ov/[name] re:  AB/ pnds  Better zyrtec  Chief Complaint  Patient presents with  . Follow-up    Pt  states that he has had a "cold" for a wk- seen by PCP and had a steroid shot. He states the steroid shot helped. He is coughing some and producing some brown sputum.   Dyspnea:  MMRC2 = can't walk a nl pace on a flat grade s sob but does fine slow and flat   Recumbent bike x 20 min x 3-5 x per week  Cough: was better until "caught a cold > pcp rx  pred but no abx and min improved   SABA use: none 02: none   No obvious day to day or daytime variability or assoc   mucus plugs or hemoptysis or cp or chest tightness, subjective wheeze or overt   hb symptoms.   Sleeping: generally ok on cpap  without nocturnal  or early am exacerbation  of respiratory  c/o's or need for noct saba. Also denies any obvious fluctuation of symptoms with weather or environmental changes or other aggravating or alleviating factors except as outlined above   No unusual exposure hx or h/o childhood pna/ asthma or knowledge of premature birth.  Current Allergies, Complete Past Medical History, Past Surgical History, Family History, and Social History were reviewed in [employer] record.  ROS  The following are not active complaints unless bolded Hoarseness, sore throat, dysphagia, dental problems, itching, sneezing,  nasal congestion or discharge of excess mucus or purulent secretions, ear ache,   fever, chills, sweats, unintended wt loss or wt gain, classically pleuritic or exertional cp,  orthopnea pnd or arm/hand swelling  or leg swelling, presyncope, palpitations, abdominal pain, anorexia, nausea, vomiting, diarrhea  or change in bowel habits or change in bladder habits, change in stools or change in urine, dysuria, hematuria,  rash, arthralgias, visual complaints, headache, numbness, weakness or ataxia or problems with walking or coordination,  change in mood or  memory.        Current Meds  Medication Sig  . albuterol (PROAIR HFA) 108 (90 Base) MCG/ACT inhaler Inhale 2 puffs into the lungs every  6 (six) hours as needed for wheezing or shortness of breath.  . ALLOPURINOL PO Take 100 mg by mouth daily.   [name] amLODipine (NORVASC) 5 MG tablet Take 5 mg by mouth daily.  .     . Cholecalciferol (VITAMIN D-3) 1000 units CAPS Take 1 capsule by mouth daily.  . COLCHICINE PO Take 1 tablet by mouth as needed.   . colesevelam (WELCHOL) 625 MG tablet Take 625 mg by mouth 2 (two) times daily with a meal.  . L-Methylfolate-Algae-B12-B6 (METANX) 3-90.314-2-35 MG CAPS Take 1 tablet by mouth daily.  . magnesium oxide (MAG-OX) 400 MG tablet Take 400 mg by mouth daily.  . metoprolol tartrate (LOPRESSOR) 25 MG tablet Take 25 mg by mouth 2 (two) times daily.  . mometasone-formoterol (DULERA) 100-5 MCG/ACT AERO Inhale 2 puffs into the lungs 2 (two) times daily.  . montelukast (SINGULAIR) 10 MG tablet Take 10 mg by mouth at bedtime.  [name] omeprazole (PRILOSEC) 20 MG capsule Take 20 mg by mouth 2 (two) times daily before a meal.  . Propylene Glycol (SYSTANE BALANCE) 0.6 % SOLN As directed  . telmisartan (MICARDIS) 80 MG tablet Take 80 mg by mouth daily.  [name] thiamine (VITAMIN B-1) 100 MG tablet Take 100 mg by mouth daily.  .                          Objective:   Physical Exam  amb slt hoarse wm nad/ occ throat clearing     01/21/2018       265  07/17/2017          258  03/29/2017        241 01/09/2017          263  11/28/2016        267  09/06/16         273   06/07/16 267 lb (121.1 kg)  05/10/16 264 lb (119.7 kg)  03/30/16 261 lb (118.4 kg)      Vital signs reviewed - Note on arrival 02 sats  96% on RA       HEENT: nl   turbinates bilaterally, and oropharynx. Nl external ear canals without cough reflex - moderate bilateral non-specific turbinate edema     NECK :  without JVD/Nodes/TM/ nl carotid upstrokes bilaterally   LUNGS: no acc muscle use,  Nl contour chest with minimal insp/ exp rhonchi / mostly upper airway/ bilaterally without cough on insp or exp maneuvers   CV:  RRR  no s3 or  murmur or increase in P2, and trace to 1+ pitting both LEs  ABD:  Tensely obese  nontender with limited  inspiratory excursion in the supine position. No bruits or organomegaly appreciated, bowel sounds nl  MS:  Nl gait/ ext warm without deformities, calf tenderness, cyanosis or clubbing No obvious joint restrictions   SKIN: warm and dry without lesions    NEURO:  alert, approp, nl sensorium with  no motor or cerebellar deficits apparent.           Assessment & Plan:

## 2018-01-21 NOTE — Assessment & Plan Note (Signed)
Acute flare with ? Sinusitis min better p steroids > rec Augmentin 875 mg take one pill twice daily  X 10 days -  Consider repeat sinus ct if not better at all > if partial response extend to 20 days rx total first.

## 2018-01-22 ENCOUNTER — Other Ambulatory Visit: Payer: Self-pay | Admitting: Internal Medicine

## 2018-07-24 ENCOUNTER — Encounter: Payer: Self-pay | Admitting: Internal Medicine

## 2018-07-24 ENCOUNTER — Ambulatory Visit (INDEPENDENT_AMBULATORY_CARE_PROVIDER_SITE_OTHER): Payer: Medicare Other | Admitting: Internal Medicine

## 2018-07-24 VITALS — BP 120/60 | HR 90 | Ht 71.0 in | Wt 262.8 lb

## 2018-07-24 DIAGNOSIS — I10 Essential (primary) hypertension: Secondary | ICD-10-CM

## 2018-07-24 DIAGNOSIS — J449 Chronic obstructive pulmonary disease, unspecified: Secondary | ICD-10-CM

## 2018-07-24 DIAGNOSIS — J45991 Cough variant asthma: Secondary | ICD-10-CM | POA: Diagnosis not present

## 2018-07-24 NOTE — Progress Notes (Signed)
Subjective:   Patient ID: Darren [name], male    DOB: Jan 21, 1941,   MRN: [id number]    Brief patient profile:  73  yowm quit smoking 1990 with cough that resolved and pattern of recurrent rhinitis since childhood spring > fall with onset of recurrent flares of cough / wheezing around 2005 resolves from days to weeks p prednisone cycle needing up to 4 x on avg  referred to pulmonary clinic 03/30/2016 by Dr   Dr Mellody Drown    History of Present Illness  03/30/2016 1st [facility] office visit/ [name]   Chief Complaint  Patient presents with  . Pulmonary Consult    Referred by Dr. [name]. Pt c/o cough and wheezing off and on "for years".    last prednisone was 15 days one week prior to OV  Back to baseline = doe x MMRC1 = can walk nl pace, flat grade, can't hurry or go uphills or steps s sob  On proair once each am  rec Plan A = Automatic = Symbicort 80 Take 2 puffs first thing in am and then another 2 puffs about 12 hours later.  Plan B = Backup Only use your albuterol as a rescue medication          07/17/2017  f/u ov/[name] re:  AB/ rhinitis with pnds is main concern Chief Complaint  Patient presents with  . Follow-up    Pt states still having to clear his throat, worse over the past 2 months.   cough does not wake him up/ sleeps ok on cpap and w/in an hour feels urge to clear throat but even then < a tbsp of mucus  clariton not effective, has not tried zyrtec or 1st gen H1 yet  Doe still = MMRC2 / not needing saba  rec Try zyrtec 10 mg at bedtime as needed for drippy nose  (available over the counter) - also can try For drainage / throat tickle try take CHLORPHENIRAMINE  4 mg - take one every 4 hours as needed -   Try dulera 100 Take 2 puffs first thing in am and then another 2 puffs about 12 hours later.  Work on maintaining perfect  inhaler technique:    01/21/2018  f/u ov/[name] re:  AB/ pnds  Better zyrtec  Chief Complaint  Patient presents with  . Follow-up    Pt  states that he has had a "cold" for a wk- seen by PCP and had a steroid shot. He states the steroid shot helped. He is coughing some and producing some brown sputum.   Dyspnea:  MMRC2 = can't walk a nl pace on a flat grade s sob but does fine slow and flat   Recumbent bike x 20 min x 3-5 x per week  Cough: was better until "caught a cold > pcp rx  pred but no abx and min improved  rec Omnicef 300 mg twice daily x 10 days       07/24/2018  f/u ov/[name] re:  AB  maint on dulera 100 2bid / singualir q pm and now on labetolol  Chief Complaint  Patient presents with  . Follow-up    Pt states he feels like he has had increased congestion over the past several wks. He has not had to use his rescue inhaler.   Dyspnea:   Doing gym cardio x 20 min legs give out first Cough: min mucoid in am's Sleeping: on cpap  SABA use: less saba despite started  on labetolol  02: no / just cpap ? per [place]    No obvious day to day or daytime variability or assoc excess/ purulent sputum or mucus plugs or hemoptysis or cp or chest tightness, subjective wheeze or overt sinus or hb symptoms.   Sleeps on cpap (?not sure who precribed) without nocturnal  or early am exacerbation  of respiratory  c/o's or need for noct saba. Also denies any obvious fluctuation of symptoms with weather or environmental changes or other aggravating or alleviating factors except as outlined above   No unusual exposure hx or h/o childhood pna/ asthma or knowledge of premature birth.  Current Allergies, Complete Past Medical History, Past Surgical History, Family History, and Social History were reviewed in [employer] record.  ROS  The following are not active complaints unless bolded Hoarseness, sore throat, dysphagia, dental problems, itching, sneezing,  nasal congestion or discharge of excess mucus or purulent secretions, ear ache,   fever, chills, sweats, unintended wt loss or wt gain, classically pleuritic or  exertional cp,  orthopnea pnd or arm/hand swelling  or leg swelling, presyncope, palpitations, abdominal pain, anorexia, nausea, vomiting, diarrhea  or change in bowel habits or change in bladder habits, change in stools or change in urine, dysuria, hematuria,  rash, arthralgias, visual complaints, headache, numbness, weakness or ataxia or problems with walking or coordination,  change in mood or  memory.        Current Meds  Medication Sig  . albuterol (PROAIR HFA) 108 (90 Base) MCG/ACT inhaler Inhale 2 puffs into the lungs every 6 (six) hours as needed for wheezing or shortness of breath.  . ALLOPURINOL PO Take 100 mg by mouth daily.   [name] amLODipine (NORVASC) 5 MG tablet Take 5 mg by mouth daily.  [name] atorvastatin (LIPITOR) 40 MG tablet Take 1 tablet by mouth daily.  . Cholecalciferol (VITAMIN D-3) 1000 units CAPS Take 1 capsule by mouth daily.  . COLCHICINE PO Take 1 tablet by mouth as needed.   . colesevelam (WELCHOL) 625 MG tablet Take 625 mg by mouth 2 (two) times daily with a meal.  . L-Methylfolate-Algae-B12-B6 (METANX) 3-90.314-2-35 MG CAPS Take 1 tablet by mouth daily.  [name] labetalol (NORMODYNE) 100 MG tablet Take 1 tablet by mouth 2 (two) times daily.  . Magnesium 400 MG TABS Take 1 tablet by mouth daily.  . mometasone-formoterol (DULERA) 100-5 MCG/ACT AERO Inhale 2 puffs into the lungs 2 (two) times daily.  . montelukast (SINGULAIR) 10 MG tablet TAKE 1 TABLET BY MOUTH AT BEDTIME  . omeprazole (PRILOSEC) 20 MG capsule Take 20 mg by mouth 2 (two) times daily before a meal.  . telmisartan (MICARDIS) 80 MG tablet Take 80 mg by mouth daily.  [name] thiamine (VITAMIN B-1) 100 MG tablet Take 100 mg by mouth daily.  . [DISCONTINUED] l-methylfolate-B6-B12 (METANX) 3-35-2 MG TABS tablet Take 1 tablet by mouth daily.              Objective:   Physical Exam  amb obese wm mod hoarse nad / mild pseudowheeze better with plm    07/24/2018       262  01/21/2018       265  07/17/2017          258    03/29/2017        241 01/09/2017          263  11/28/2016        267  09/06/16  273   06/07/16 267 lb (121.1 kg)  05/10/16 264 lb (119.7 kg)  03/30/16 261 lb (118.4 kg)      Vital signs reviewed - Note on arrival 02 sats  95% on RA         HEENT: nl dentition, turbinates bilaterally, and oropharynx. Nl external ear canals without cough reflex   NECK :  without JVD/Nodes/TM/ nl carotid upstrokes bilaterally   LUNGS: no acc muscle use,  Nl contour chest with min ins/exp rhonchi  bilaterally without cough on insp or exp maneuvers   CV:  RRR  no s3 or murmur or increase in P2, and no edema   ABD:  Tensely obese/ nontender with limited  inspiratory excursion in the supine position. No bruits or organomegaly appreciated, bowel sounds nl  MS:  Nl gait/ ext warm without deformities, calf tenderness, cyanosis or clubbing No obvious joint restrictions   SKIN: warm and dry without lesions    NEURO:  alert, approp, nl sensorium with  no motor or cerebellar deficits apparent.              Assessment & Plan:

## 2018-07-24 NOTE — Patient Instructions (Signed)
No change in your medications but if you are having problems with your breathing or needing your rescue inhaler (albuterol)  Then I would strongly recommend bisporolol instead of labetolol    Please schedule a follow up visit in 3 months but call sooner if needed  with all medications /inhalers/ solutions in hand so we can verify exactly what you are taking. This includes all medications from all doctors and over the counters  - PFT s on return

## 2018-07-25 ENCOUNTER — Encounter: Payer: Self-pay | Admitting: Internal Medicine

## 2018-07-25 DIAGNOSIS — I10 Essential (primary) hypertension: Secondary | ICD-10-CM | POA: Insufficient documentation

## 2018-07-25 HISTORY — DX: Essential (primary) hypertension: I10

## 2018-07-25 NOTE — Assessment & Plan Note (Signed)
Quit smoking 1990 Spirometry 03/30/2016  FEV1 1.47 (45%)  Ratio 62  rec return in 3 m for pfts on labetolol if tolerates in interim

## 2018-07-25 NOTE — Assessment & Plan Note (Signed)
Onset around 2005  FENO 03/30/2016  =   47 on just saba  - 03/30/2016  After extensive coaching HFA effectiveness =    90% :  Try symbicort 80 2bid x 6 weeks then return with repeat spirometry >  04/06/2016 notified insurance prefers advair > try 115 hfa 2 bid   - Allergy profile 05/10/2016 >  Eos 0.4 /  IgE 60 Pos Grass/ cedar trees   - 05/10/2016 changed back to 4 weeks of symb 80 p flared on advair - FENO 06/07/2016  =   10 on symb 80 2bid  - Spirometry 06/07/2016  FEV1 1.89 (58%)  Ratio  71   - 09/06/2016   try dulera 200 2bid - 11/28/2016 excess shaking on duelra 200 2bid so try one bid and off gerd rx since is not convinced worked - Sinus CT 11/29/2016 >>> Mucosal edema in the maxillary and ethmoid sinuses bilaterally. No air-fluid level. - Singulair 10 mg started first week in Sept 2018   - 07/17/2017   rechallenge with the dulera 100   - 07/24/2018  After extensive coaching inhaler device,  effectiveness =    90% but some "congestion" since started labetolol   It is not clear whether labetalol is adding to his symptoms or not so for now I recommended he continue it with no change in his medications otherwise. See hbp

## 2018-07-25 NOTE — Assessment & Plan Note (Signed)
Body mass index is 36.65 kg/m.  -  trending down slightly  No results found for: TSH   Contributing to gerd risk/ doe/reviewed the need and the process to achieve and maintain neg calorie balance > defer f/u primary care including intermittently monitoring thyroid status

## 2018-07-25 NOTE — Assessment & Plan Note (Signed)
In the setting of respiratory symptoms of unknown etiology,  It would be preferable to use bystolic, the most beta -1  selective Beta blocker available in sample form, with bisoprolol the most selective generic choice  on the market, at least on a trial basis, to make sure the spillover Beta 2 effects of the less specific Beta blockers are not contributing to this patient's symptoms.   Defer to Dr Gwenlyn Found whether to change to bisoprolol depeding on how he does over the next 3 months.  If he is going to stay on labetalol he needs a new set of PFTs and I recommended this.   I had an extended discussion with the patient reviewing all relevant studies completed to date and  lasting 15 to 20 minutes of a 25 minute visit    See device teaching which extended face to face time for this visit.  Each maintenance medication was reviewed in detail including emphasizing most importantly the difference between maintenance and prns and under what circumstances the prns are to be triggered using an action plan format that is not reflected in the computer generated alphabetically organized AVS which I have not found useful in most complex patients, especially with respiratory illnesses  Please see AVS for specific instructions unique to this visit that I personally wrote and verbalized to the the pt in detail and then reviewed with pt  by my nurse highlighting any  changes in therapy recommended at today's visit to their plan of care.

## 2018-09-11 ENCOUNTER — Encounter: Payer: Self-pay | Admitting: Cardiology

## 2018-09-11 ENCOUNTER — Ambulatory Visit (INDEPENDENT_AMBULATORY_CARE_PROVIDER_SITE_OTHER): Payer: Medicare Other | Admitting: Cardiology

## 2018-09-11 ENCOUNTER — Ambulatory Visit: Payer: Self-pay | Admitting: Cardiology

## 2018-09-11 VITALS — BP 151/96 | HR 136 | Ht 66.0 in | Wt 265.4 lb

## 2018-09-11 DIAGNOSIS — R0600 Dyspnea, unspecified: Secondary | ICD-10-CM

## 2018-09-11 DIAGNOSIS — R0602 Shortness of breath: Secondary | ICD-10-CM | POA: Insufficient documentation

## 2018-09-11 DIAGNOSIS — R0609 Other forms of dyspnea: Secondary | ICD-10-CM | POA: Diagnosis not present

## 2018-09-11 DIAGNOSIS — E7800 Pure hypercholesterolemia, unspecified: Secondary | ICD-10-CM

## 2018-09-11 DIAGNOSIS — I480 Paroxysmal atrial fibrillation: Secondary | ICD-10-CM | POA: Insufficient documentation

## 2018-09-11 DIAGNOSIS — I10 Essential (primary) hypertension: Secondary | ICD-10-CM | POA: Diagnosis not present

## 2018-09-11 DIAGNOSIS — G4733 Obstructive sleep apnea (adult) (pediatric): Secondary | ICD-10-CM | POA: Insufficient documentation

## 2018-09-11 DIAGNOSIS — Z0189 Encounter for other specified special examinations: Secondary | ICD-10-CM

## 2018-09-11 HISTORY — DX: Pure hypercholesterolemia, unspecified: E78.00

## 2018-09-11 HISTORY — DX: Other forms of dyspnea: R06.09

## 2018-09-11 HISTORY — DX: Obstructive sleep apnea (adult) (pediatric): G47.33

## 2018-09-11 HISTORY — DX: Dyspnea, unspecified: R06.00

## 2018-09-11 HISTORY — DX: Paroxysmal atrial fibrillation: I48.0

## 2018-09-11 MED ORDER — METOPROLOL TARTRATE 25 MG PO TABS: 25.0000 mg | ORAL_TABLET | Freq: Two times a day (BID) | ORAL | 2 refills | Status: DC

## 2018-09-11 MED ORDER — APIXABAN 5 MG PO TABS: 5.0000 mg | ORAL_TABLET | Freq: Two times a day (BID) | ORAL | 3 refills | Status: DC

## 2018-09-11 NOTE — Progress Notes (Signed)
Subjective:  Primary Physician:  [name], MD  Patient ID: Darren [name], male    DOB: 28-Jun-1941, 78 y.o.   MRN: [id number]  No chief complaint on file.   HPI: Darren [name]  is a 78 y.o. male  with  chronic shortness of breath and paroxysmal atrial fibrillation who presents for 6 month follow-up. He was admitted to the hospital in August 2018 with acute GI bleed needing 5 units of packed cell transfusion. 2 days prior to the admission, he had severe dark stools but did not think it was related to GI bleed. GI bleed was felt to be mucosal irritation with GERD and Xarelto by GI. He had also stopped taking his PPI at that time.   He now made this appointment as per Dr. [name] due to A. Fib and dyspnea. In 2013 when he was also diagnosed with severe obstructive sleep apnea and has been compliant with CPAP.   Has chronic leg edema and mild worsening dyspnea on exertion and fatigue over the past 1 week to 10 days.  In September I discontinued his metoprolol 25 mg daily as he had marked sinus bradycardia and also he had complained of fatigue, and switched him to labetalol to avoid bradycardia especially in view of his age greater than 70 years.  However this was discontinued by pulmonary medicine and was switched over to bisoprolol due to probable interaction from pulmonary standpoint.  Past Medical History:  Diagnosis Date  . Atrial fibrillation ([place])   . bilateral leg edema 09/11/2018  . Dyspnea on exertion 09/11/2018  . Essential hypertension 07/25/2018  . Obstructive sleep apnea 09/11/2018  . OSA (obstructive sleep apnea)     Past Surgical History:  Procedure Laterality Date  . COLONOSCOPY Left 03/10/2017   Procedure: COLONOSCOPY;  Surgeon: Arta Silence, MD;  Location: [facility] ENDOSCOPY;  Service: Endoscopy;  Laterality: Left;  . ESOPHAGOGASTRODUODENOSCOPY (EGD) WITH PROPOFOL Left 03/09/2017   Procedure: ESOPHAGOGASTRODUODENOSCOPY (EGD) WITH PROPOFOL;  Surgeon: Ronnette Juniper, MD;   Location: [facility];  Service: Gastroenterology;  Laterality: Left;    Social History   Socioeconomic History  . Marital status: Married    Spouse name: Not on file  . Number of children: Not on file  . Years of education: Not on file  . Highest education level: Not on file  Occupational History  . Not on file  Social Needs  . Financial resource strain: Not on file  . Food insecurity:    Worry: Not on file    Inability: Not on file  . Transportation needs:    Medical: Not on file    Non-medical: Not on file  Tobacco Use  . Smoking status: Former Smoker    Packs/day: 1.00    Years: 25.00    Pack years: 25.00    Last attempt to quit: 07/10/1988    Years since quitting: 30.1  . Smokeless tobacco: Never Used  Substance and Sexual Activity  . Alcohol use: Yes    Comment: daily but not for a couple of weeks  . Drug use: No  . Sexual activity: Not on file  Lifestyle  . Physical activity:    Days per week: Not on file    Minutes per session: Not on file  . Stress: Not on file  Relationships  . Social connections:    Talks on phone: Not on file    Gets together: Not on file    Attends religious service: Not on file  Active member of club or organization: Not on file    Attends meetings of clubs or organizations: Not on file    Relationship status: Not on file  . Intimate partner violence:    Fear of current or ex partner: Not on file    Emotionally abused: Not on file    Physically abused: Not on file    Forced sexual activity: Not on file  Other Topics Concern  . Not on file  Social History Narrative  . Not on file    Current Outpatient Medications on File Prior to Visit  Medication Sig Dispense Refill  . albuterol (PROAIR HFA) 108 (90 Base) MCG/ACT inhaler Inhale 2 puffs into the lungs every 6 (six) hours as needed for wheezing or shortness of breath.    . ALLOPURINOL PO Take 100 mg by mouth daily.     [name] amLODipine (NORVASC) 5 MG tablet Take 5 mg by mouth  daily.    [name] atorvastatin (LIPITOR) 40 MG tablet Take 1 tablet by mouth daily.    . Cholecalciferol (VITAMIN D-3) 1000 units CAPS Take 1 capsule by mouth daily.    . COLCHICINE PO Take 1 tablet by mouth as needed.     . colesevelam (WELCHOL) 625 MG tablet Take 625 mg by mouth 2 (two) times daily with a meal.    . L-Methylfolate-Algae-B12-B6 (METANX) 3-90.314-2-35 MG CAPS Take 1 tablet by mouth daily.    [name] labetalol (NORMODYNE) 100 MG tablet Take 1 tablet by mouth 2 (two) times daily.    . Magnesium 400 MG TABS Take 1 tablet by mouth daily.    . mometasone-formoterol (DULERA) 100-5 MCG/ACT AERO Inhale 2 puffs into the lungs 2 (two) times daily. 1 Inhaler 0  . montelukast (SINGULAIR) 10 MG tablet TAKE 1 TABLET BY MOUTH AT BEDTIME 30 tablet 11  . omeprazole (PRILOSEC) 20 MG capsule Take 20 mg by mouth 2 (two) times daily before a meal.    . telmisartan (MICARDIS) 80 MG tablet Take 80 mg by mouth daily.    [name] thiamine (VITAMIN B-1) 100 MG tablet Take 100 mg by mouth daily.     No current facility-administered medications on file prior to visit.    Review of Systems  Constitutional: Positive for malaise/fatigue. Negative for weight loss.  Respiratory: Positive for shortness of breath. Negative for cough and hemoptysis.   Cardiovascular: Negative for chest pain, palpitations, claudication and leg swelling.  Gastrointestinal: Negative for abdominal pain, blood in stool, constipation, heartburn and vomiting.  Genitourinary: Negative for dysuria.  Musculoskeletal: Positive for joint pain. Negative for myalgias.  Neurological: Negative for dizziness, focal weakness and headaches.  Endo/Heme/Allergies: Does not bruise/bleed easily.  Psychiatric/Behavioral: Negative for depression. The patient is not nervous/anxious.   All other systems reviewed and are negative.      Objective:  There were no vitals taken for this visit. There is no height or weight on file to calculate BMI.  Physical Exam    Constitutional: He appears well-developed. No distress.  HENT:  Head: Atraumatic.  Eyes: Conjunctivae are normal.  Neck: Neck supple. No JVD present. No thyromegaly present.  Cardiovascular: An irregularly irregular rhythm present. Tachycardia present. Exam reveals no gallop.  No murmur heard. Pulses:      Carotid pulses are 2+ on the right side and 2+ on the left side.      Dorsalis pedis pulses are 2+ on the right side and 2+ on the left side.       Posterior tibial  pulses are 2+ on the right side and 2+ on the left side.  Femoral and popliteal pulse difficult to feel due to patient's body habitus.   Pulmonary/Chest: Effort normal and breath sounds normal.  Abdominal: Soft. Bowel sounds are normal.  Musculoskeletal: Normal range of motion.        General: Edema (2 + pitting) present.  Neurological: He is alert.  Skin: Skin is warm and dry.  Psychiatric: He has a normal mood and affect.    CARDIAC STUDIES:   Echocardiogram 05/16/2017: Left ventricle cavity is normal in size. Mild concentric hypertrophy of the left ventricle. Normal global wall motion. LVEF 55-60%. Left atrial cavity is moderate to severely dilated. Mild (Grade I) mitral regurgitation. No significant change compared to prior study dated 08/06/2014.  Exercise myoview stress test  04/27/2012: 1. Resting EKG showed normal sinus rhythm, LAD, LAFB, IRBBB. Stress EKG was negative for ischemia. No ST-T changes of ischemia noted with stress exercise achieving adequate heart rate. Bruce protocol for 5 minutes 11 secs. The maximum work level achieved was 7.3 MET's. Hypertensive BP response with peak BP of 202/80 mm Hg. Patient appeared to be in sinus rhythm during the test. 2. Perfusion imaging study demonstrates prominent diaphragmatic attenuation without ischemia or scar. Perfusion improved with stress images in the inferior wall and there is normal wall motion. Hence defect consistent with soft tissue attenuation than  scar. This is a low risk study.  Assessment & Recommendations:   1. Paroxysmal atrial fibrillation (HCC) CHA2DS2-VASc Score is 3.  -(CHF; HTN; vasc disease DM,  Male = 1; Age <65 =0; 65-74 = 1,  >75 =2; stroke = 2).  -(Yearly risk of stroke: Score of 1=1.3; 2=2.2; 3=3.2; 4=4; 5=6.7; 6=9.8; 7=>9.8)  EKG 09/11/2018: Atrial fibrillation with rapid ventricular response at the rate of 136 bpm, left axis deviation, left anterior fascicular block.  Incomplete right bundle branch block.  Nonspecific T abnormality.   2. Sick sinus syndrome, has underlying sinus bradycardia on on minimal dose of beta blocker, today he is in atrial fibrillation with RVR.  3. Obstructive sleep apnea compliant with CPAP  4. Dyspnea on exertion Chronic diastolic CHF  5. bilateral leg edema  6. Essential hypertension  7. Morbid obesity.  8.  Laboratory examination  Labs 09/10/2018: HB 15.8/HCT 46.3, mild macrocytosis present with MCV of 100.  Platelet count 154.  Potassium 4.9, BUN 24, creatinine 1.1, eGFR greater than 60 him up.  ALT and AST elevated at 64 and 57 respectively.  Total cholesterol 184, triglycerides 199, HDL 85, LDL 59.  Labs 07/12/2017: HB 15.6, HCT 47.8, platelets 155.  Normal indicis.  Potassium 4.8, serum glucose 160 mg, BUN 23, creatinine 1.1.  CMP normal.  Total cholesterol 232, triglycerides 138, HDL 62, LDL 07/10/40.  Non-HDL cholesterol 170.  Uric acid is elevated at 7.8, vitamin D 25 x 1.  Recommendation:   Patient has noticed worsening dyspnea and also fatigue over the past one week to 10 days, his back in atrial fibrillation with RVR.  I do not think that change in medications were metoprolol to labetalol, to bisoprolol and now back in 2 labetalol has made any difference with atrial fibrillation onset, I suspect it is probably related to his obesity and also severe obstructive sleep apnea although his very compliant with CPAP.  Progression of coronary disease need to be evaluated as  well. Schedule for a Lexiscan Sestamibi stress test to evaluate for myocardial ischemia. Patient unable to do treadmill stress testing due  to arthritis and dyspnea. Will schedule for an echocardiogram.    And is very long discussion with him regarding bleeding risk associated with anticoagulation.  He has had prior history of GI bleed, but he states that this is related clearly to stopping his PPI and he would like to go back on being on PPI regularly along with being on anticoagulation.  I'll start him on Eliquis 5 mg p.o. b.i.d.  I reviewed his lab data from his PCP Dr. Mellody Drown.  I'll start him back on metoprolol, 25 g b.i.d., for the next 2 days to take 50 mg b.i.d.  He had marked sinus bradycardia on his last office visit sometime in September 2019, I suspect he may have a component  of sick sinus syndrome.  Hence I'm skeptical of using any antiarrhythmic therapy due to heart block risk.   He also felt fatigued and tired when his heart rate was low, this had improved ongoing on labetalol.  However it was changed to bisoprolol as per the request from pulmonary medicine due to history of bronchial asthma.  I plan on performing direct current cardioversion if he continues to remain atrial fibrillation on his next visit.  He needs to be on anticoagulation for 3 weeks. Recurrence of atrial fibrillation extremely high In view of morbid obesity, hypertension, age, obstructive sleep apnea.  [name], MD, [facility] 09/11/2018, 9:01 AM [facility] [place] Pager: [phone] Office: [phone] If no answer Cell [phone]

## 2018-09-12 ENCOUNTER — Encounter: Payer: Self-pay | Admitting: Cardiology

## 2018-09-20 ENCOUNTER — Other Ambulatory Visit: Payer: Self-pay

## 2018-09-20 ENCOUNTER — Ambulatory Visit: Payer: Medicare Other

## 2018-09-20 DIAGNOSIS — I4891 Unspecified atrial fibrillation: Secondary | ICD-10-CM

## 2018-09-20 DIAGNOSIS — I480 Paroxysmal atrial fibrillation: Secondary | ICD-10-CM

## 2018-09-20 DIAGNOSIS — R0609 Other forms of dyspnea: Secondary | ICD-10-CM

## 2018-09-20 MED ORDER — MOMETASONE FURO-FORMOTEROL FUM 100-5 MCG/ACT IN AERO: 2.0000 | INHALATION_SPRAY | Freq: Two times a day (BID) | RESPIRATORY_TRACT | 3 refills | Status: DC

## 2018-09-23 ENCOUNTER — Ambulatory Visit: Payer: Medicare Other

## 2018-09-23 ENCOUNTER — Other Ambulatory Visit: Payer: Self-pay

## 2018-09-23 DIAGNOSIS — I480 Paroxysmal atrial fibrillation: Secondary | ICD-10-CM

## 2018-09-23 DIAGNOSIS — R0609 Other forms of dyspnea: Secondary | ICD-10-CM

## 2018-09-25 ENCOUNTER — Other Ambulatory Visit: Payer: Medicare Other

## 2018-10-01 NOTE — Progress Notes (Signed)
Decreased LVEF probably due to A. Fib, needs further evaluation. Will have to wait until COVID issues done unless he is not feeling well.  We could set up eVisit

## 2018-10-03 ENCOUNTER — Encounter: Payer: Self-pay | Admitting: Cardiology

## 2018-10-03 ENCOUNTER — Ambulatory Visit (INDEPENDENT_AMBULATORY_CARE_PROVIDER_SITE_OTHER): Payer: Medicare Other | Admitting: Cardiology

## 2018-10-03 VITALS — BP 136/104 | HR 127 | Ht 66.0 in | Wt 253.0 lb

## 2018-10-03 DIAGNOSIS — R0609 Other forms of dyspnea: Secondary | ICD-10-CM | POA: Diagnosis not present

## 2018-10-03 DIAGNOSIS — I10 Essential (primary) hypertension: Secondary | ICD-10-CM

## 2018-10-03 DIAGNOSIS — I480 Paroxysmal atrial fibrillation: Secondary | ICD-10-CM | POA: Diagnosis not present

## 2018-10-03 DIAGNOSIS — E7800 Pure hypercholesterolemia, unspecified: Secondary | ICD-10-CM | POA: Diagnosis not present

## 2018-10-03 NOTE — H&P (View-Only) (Signed)
Subjective:  Primary Physician:  Darren Panda, MD  Patient ID: Darren [name], male    DOB: 1941/06/08, 78 y.o.   MRN: [id number]  Chief Complaint  Patient presents with   Atrial Fibrillation    f/u after tests, high bp/pulse    HPI: Darren [name]  is a 78 y.o. male  with  chronic shortness of breath and paroxysmal atrial fibrillation, was seen by me on 09/09/2018 for worsening dyspnea on exertion, was found to be in A. fib with RVR.  He is presently on Xarelto for paroxysmal atrial fibrillation.  Last episode of GI bleed was in August 2018, felt to have mild gastric mucosal irritation.  Past medical history significant for obstructive sleep apnea, compliant with CPAP, morbid obesity, underlying marked sinus bradycardia even on minimal dose of beta-blocker, hyperlipidemia, bronchial asthma and also has history of excessive alcohol intake.  He underwent echocardiogram and stress testing and I am connecting with him via virtual visit due to [place].  Previously he was on metoprolol small dose, but due to bradycardia and fatigue, and switch him to labetalol but was switched to bisoprolol by pulmonary medicine.  When I saw him he had gone into A. fib, hence I discontinued bisoprolol and started him back on metoprolol.  He is tolerating this well.  Past Medical History:  Diagnosis Date   Atrial fibrillation ([name])    bilateral leg edema 09/11/2018   Dyspnea on exertion 09/11/2018   Essential hypertension 07/25/2018   Obstructive sleep apnea 09/11/2018   OSA (obstructive sleep apnea)    Paroxysmal atrial fibrillation ([name]) 09/11/2018    Past Surgical History:  Procedure Laterality Date   COLONOSCOPY Left 03/10/2017   Procedure: COLONOSCOPY;  Surgeon: Arta Silence, MD;  Location: [facility];  Service: Endoscopy;  Laterality: Left;   ESOPHAGOGASTRODUODENOSCOPY (EGD) WITH PROPOFOL Left 03/09/2017   Procedure: ESOPHAGOGASTRODUODENOSCOPY (EGD) WITH PROPOFOL;  Surgeon: Ronnette Juniper, MD;   Location: [facility];  Service: Gastroenterology;  Laterality: Left;    Social History   Socioeconomic History   Marital status: Married    Spouse name: Not on file   Number of children: 4   Years of education: Not on file   Highest education level: Not on file  Occupational History   Not on file  Social Needs   Financial resource strain: Not on file   Food insecurity:    Worry: Not on file    Inability: Not on file   Transportation needs:    Medical: Not on file    Non-medical: Not on file  Tobacco Use   Smoking status: Former Smoker    Packs/day: 1.00    Years: 25.00    Pack years: 25.00    Last attempt to quit: 07/10/1988    Years since quitting: 30.2   Smokeless tobacco: Never Used  Substance and Sexual Activity   Alcohol use: Yes    Comment: daily but not for a couple of weeks   Drug use: No   Sexual activity: Not on file  Lifestyle   Physical activity:    Days per week: Not on file    Minutes per session: Not on file   Stress: Not on file  Relationships   Social connections:    Talks on phone: Not on file    Gets together: Not on file    Attends religious service: Not on file    Active member of club or organization: Not on file    Attends meetings of  clubs or organizations: Not on file    Relationship status: Not on file   Intimate partner violence:    Fear of current or ex partner: Not on file    Emotionally abused: Not on file    Physically abused: Not on file    Forced sexual activity: Not on file  Other Topics Concern   Not on file  Social History Narrative   Not on file    Current Outpatient Medications on File Prior to Visit  Medication Sig Dispense Refill   ALLOPURINOL PO Take 100 mg by mouth daily.      apixaban (ELIQUIS) 5 MG TABS tablet Take 1 tablet (5 mg total) by mouth 2 (two) times daily. 60 tablet 3   atorvastatin (LIPITOR) 40 MG tablet Take 1 tablet by mouth daily.     Cholecalciferol (VITAMIN D-3) 1000 units  CAPS Take 1 capsule by mouth daily.     COLCHICINE PO Take 1 tablet by mouth as needed.      colesevelam (WELCHOL) 625 MG tablet Take 625 mg by mouth 2 (two) times daily with a meal.     L-Methylfolate-Algae-B12-B6 (METANX) 3-90.314-2-35 MG CAPS Take 1 tablet by mouth 2 (two) times daily.      Magnesium 400 MG TABS Take 1 tablet by mouth daily.     metoprolol tartrate (LOPRESSOR) 25 MG tablet Take 1 tablet (25 mg total) by mouth 2 (two) times daily. Take two tablets twice daily for 2 days (Patient taking differently: Take 25 mg by mouth 2 (two) times daily. ) 60 tablet 2   mometasone-formoterol (DULERA) 100-5 MCG/ACT AERO Inhale 2 puffs into the lungs 2 (two) times daily. 3 Inhaler 3   montelukast (SINGULAIR) 10 MG tablet TAKE 1 TABLET BY MOUTH AT BEDTIME 30 tablet 11   omeprazole (PRILOSEC) 20 MG capsule Take 20 mg by mouth 2 (two) times daily before a meal.     telmisartan (MICARDIS) 80 MG tablet Take 80 mg by mouth daily.     thiamine (VITAMIN B-1) 100 MG tablet Take 100 mg by mouth daily.     No current facility-administered medications on file prior to visit.    Review of Systems  Constitutional: Positive for malaise/fatigue. Negative for weight loss.  Respiratory: Positive for shortness of breath. Negative for cough and hemoptysis.   Cardiovascular: Negative for chest pain, palpitations, claudication and leg swelling.  Gastrointestinal: Negative for abdominal pain, blood in stool, constipation, heartburn and vomiting.  Genitourinary: Negative for dysuria.  Musculoskeletal: Positive for joint pain. Negative for myalgias.  Neurological: Negative for dizziness, focal weakness and headaches.  Endo/Heme/Allergies: Does not bruise/bleed easily.  Psychiatric/Behavioral: Negative for depression. The patient is not nervous/anxious.   All other systems reviewed and are negative.     Objective:  Blood pressure (!) 136/104, pulse (!) 127, height 5' 6"  (1.676 m), weight 253 lb (114.8  kg). Body mass index is 40.84 kg/m.  Physical Exam  Constitutional: He appears well-developed. No distress. Obese. Neck: Neck supple. No JVD present. No thyromegaly present.   Pulmonary/Chest: Effort normal   Musculoskeletal: Normal range of motion.        General: Edema  present.  Neurological: He is alert.  Psychiatric: He has a normal mood and affect.   Limited exam - video  CARDIAC STUDIES:    Echocardiogram 09/23/2018 :  Poor echo window. Wall motion abnormality has reduced sensitivity. Left ventricle cavity is normal in size. Moderate concentric hypertrophy of the left ventricle. Abnormal septal wall motion  due to left bundle branch block. Cannot exclude anterio septal hypokinesis.  Normal diastolic filling pattern. Visual EF is 30-35%. Left atrial cavity is moderate to severely dilated at 4.7 cm. Moderate (Grade II) mitral regurgitation. Trace tricuspid regurgitation. Unable to estimate PA pressure due to absence/minimal TR signal.  Lexiscan myoview stress test 09/20/2018:  1. Lexiscan stress test was performed. Exercise capacity was not assessed. Stress symptoms included dizziness. Resting blood pressure was 164/108 mmHg and peak effect blood pressure was 174/116 mmHg. The resting and stress electrocardiogram demonstrated atrial fibrillation with rapid ventricular rate, occasional PVC, and normal rest repolarization.  Stress EKG is non diagnostic for ischemia as it is a pharmacologic stress. In addition, no ischemic changes seen at heart rate of 136 bpm.  2. The overall quality of the study is good.  Left ventricular cavity is noted to be enlarged on the rest and stress studies.  Gated SPECT images reveal moderate global decrease in myocardial thickening and wall motion.  The left ventricular ejection fraction was calculated or visually estimated to be 34%.  REST and STRESS images demonstrate mildly decreased tracer uptake in the basal inferior, mid inferior and apical inferior  segments of the left ventricle, with no reversibility. Findings suggestive of dilated nonishcemic cardiomyopathy.  3. High risk study due to reduced LVED. Recommend clinical correlation.   Echocardiogram 05/16/2017: Left ventricle cavity is normal in size. Mild concentric hypertrophy of the left ventricle. Normal global wall motion. LVEF 55-60%. Left atrial cavity is moderate to severely dilated. Mild (Grade I) mitral regurgitation. No significant change compared to prior study dated 08/06/2014.   Assessment & Recommendations:   Paroxysmal atrial fibrillation (HCC)  Dyspnea on exertion  Essential hypertension  bilateral leg edema CHA2DS2-VASc Score is 3.  -(CHF; HTN; vasc disease DM,  Male = 1; Age <65 =0; 65-74 = 1,  >75 =2; stroke = 2).  -(Yearly risk of stroke: Score of 1=1.3; 2=2.2; 3=3.2; 4=4; 5=6.7; 6=9.8; 7=>9.8)  EKG 09/11/2018: Atrial fibrillation with rapid ventricular response at the rate of 136 bpm, left axis deviation, left anterior fascicular block.  Incomplete right bundle branch block.  Nonspecific T abnormality.  Sick sinus syndrome, has underlying sinus bradycardia on on minimal dose of beta blocker, today he is in atrial fibrillation with RVR.  Laboratory examination  Labs 09/10/2018: HB 15.8/HCT 46.3, mild macrocytosis present with MCV of 100.  Platelet count 154.  Potassium 4.9, BUN 24, creatinine 1.1, eGFR greater than 60 mL.  ALT and AST elevated at 64 and 57 respectively.  Total cholesterol 184, triglycerides 199, HDL 85, LDL 59.  Labs 07/12/2017: HB 15.6, HCT 47.8, platelets 155.  Normal indicis.  Potassium 4.8, serum glucose 160 mg, BUN 23, creatinine 1.1.  CMP normal.  Total cholesterol 232, triglycerides 138, HDL 62, LDL 07/10/40.  Non-HDL cholesterol 170.  Uric acid is elevated at 7.8, vitamin D 25 x 1.  Recommendation:   I have reviewed the results of the recently performed echocardiogram which revealed severe LV systolic dysfunction probably related  to A. fib with RVR.  Nuclear stress test does not reveal ischemia.  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  Will increase metoprolol from 25 g twice daily to 50 mg twice daily, will also add amiodarone 200 mg p.o. twice daily and I will set him up for direct-current cardioversion in view of continued symptoms of marked dyspnea even with minimal activity.  I have extensively discussed with the patient regarding the side effects of amiodarone, complications from cardioversion as well.  Patient states that he is extremely symptomatic with marked fatigue and dyspnea. No change in leg edema. Compliant with CPAP.   This visit type was conducted due to national recommendations for restrictions regarding the COVID-19 Pandemic (e.g. social distancing).  This format is felt to be most appropriate for this patient at this time.  All issues noted in this document were discussed and addressed.  No physical exam was performed (except for noted visual exam findings with Telehealth visits).  The patient has consented to conduct a Telehealth visit and understands insurance will be billed.   I connected with@, on 10/03/18 at  by a video enabled telemedicine application and verified that I am speaking with the correct person using two identifiers.     I discussed the limitations of evaluation and management by telemedicine and the availability of in person appointments. The patient expressed understanding and agreed to proceed.   I have discussed with her regarding the safety during COVID Pandemic and steps and precautions including social distancing with the patient. Tthis was a 25 min AV face to face encounter with additional 5 minutes on review of medical records and charting.   [name], MD, [facility] 10/03/2018, 10:42 AM [facility]. [place] Pager: [phone] Office: [phone] If no answer Cell [phone]

## 2018-10-03 NOTE — Progress Notes (Signed)
Subjective:  Primary Physician:  [name], MD  Patient ID: Darren [name], male    DOB: 02-12-1941, 78 y.o.   MRN: [id number]  Chief Complaint  Patient presents with   Atrial Fibrillation    f/u after tests, high bp/pulse    HPI: Darren [name]  is a 78 y.o. male  with  chronic shortness of breath and paroxysmal atrial fibrillation, was seen by me on 09/09/2018 for worsening dyspnea on exertion, was found to be in A. fib with RVR.  He is presently on Xarelto for paroxysmal atrial fibrillation.  Last episode of GI bleed was in August 2018, felt to have mild gastric mucosal irritation.  Past medical history significant for obstructive sleep apnea, compliant with CPAP, morbid obesity, underlying marked sinus bradycardia even on minimal dose of beta-blocker, hyperlipidemia, bronchial asthma and also has history of excessive alcohol intake.  He underwent echocardiogram and stress testing and I am connecting with him via virtual visit due to [place].  Previously he was on metoprolol small dose, but due to bradycardia and fatigue, and switch him to labetalol but was switched to bisoprolol by pulmonary medicine.  When I saw him he had gone into A. fib, hence I discontinued bisoprolol and started him back on metoprolol.  He is tolerating this well.  Past Medical History:  Diagnosis Date   Atrial fibrillation ([place])    bilateral leg edema 09/11/2018   Dyspnea on exertion 09/11/2018   Essential hypertension 07/25/2018   Obstructive sleep apnea 09/11/2018   OSA (obstructive sleep apnea)    Paroxysmal atrial fibrillation ([place]) 09/11/2018    Past Surgical History:  Procedure Laterality Date   COLONOSCOPY Left 03/10/2017   Procedure: COLONOSCOPY;  Surgeon: Arta Silence, MD;  Location: [facility];  Service: Endoscopy;  Laterality: Left;   ESOPHAGOGASTRODUODENOSCOPY (EGD) WITH PROPOFOL Left 03/09/2017   Procedure: ESOPHAGOGASTRODUODENOSCOPY (EGD) WITH PROPOFOL;  Surgeon: Ronnette Juniper, MD;   Location: [facility];  Service: Gastroenterology;  Laterality: Left;    Social History   Socioeconomic History   Marital status: Married    Spouse name: Not on file   Number of children: 4   Years of education: Not on file   Highest education level: Not on file  Occupational History   Not on file  Social Needs   Financial resource strain: Not on file   Food insecurity:    Worry: Not on file    Inability: Not on file   Transportation needs:    Medical: Not on file    Non-medical: Not on file  Tobacco Use   Smoking status: Former Smoker    Packs/day: 1.00    Years: 25.00    Pack years: 25.00    Last attempt to quit: 07/10/1988    Years since quitting: 30.2   Smokeless tobacco: Never Used  Substance and Sexual Activity   Alcohol use: Yes    Comment: daily but not for a couple of weeks   Drug use: No   Sexual activity: Not on file  Lifestyle   Physical activity:    Days per week: Not on file    Minutes per session: Not on file   Stress: Not on file  Relationships   Social connections:    Talks on phone: Not on file    Gets together: Not on file    Attends religious service: Not on file    Active member of club or organization: Not on file    Attends meetings of  clubs or organizations: Not on file    Relationship status: Not on file   Intimate partner violence:    Fear of current or ex partner: Not on file    Emotionally abused: Not on file    Physically abused: Not on file    Forced sexual activity: Not on file  Other Topics Concern   Not on file  Social History Narrative   Not on file    Current Outpatient Medications on File Prior to Visit  Medication Sig Dispense Refill   ALLOPURINOL PO Take 100 mg by mouth daily.      apixaban (ELIQUIS) 5 MG TABS tablet Take 1 tablet (5 mg total) by mouth 2 (two) times daily. 60 tablet 3   atorvastatin (LIPITOR) 40 MG tablet Take 1 tablet by mouth daily.     Cholecalciferol (VITAMIN D-3) 1000 units  CAPS Take 1 capsule by mouth daily.     COLCHICINE PO Take 1 tablet by mouth as needed.      colesevelam (WELCHOL) 625 MG tablet Take 625 mg by mouth 2 (two) times daily with a meal.     L-Methylfolate-Algae-B12-B6 (METANX) 3-90.314-2-35 MG CAPS Take 1 tablet by mouth 2 (two) times daily.      Magnesium 400 MG TABS Take 1 tablet by mouth daily.     metoprolol tartrate (LOPRESSOR) 25 MG tablet Take 1 tablet (25 mg total) by mouth 2 (two) times daily. Take two tablets twice daily for 2 days (Patient taking differently: Take 25 mg by mouth 2 (two) times daily. ) 60 tablet 2   mometasone-formoterol (DULERA) 100-5 MCG/ACT AERO Inhale 2 puffs into the lungs 2 (two) times daily. 3 Inhaler 3   montelukast (SINGULAIR) 10 MG tablet TAKE 1 TABLET BY MOUTH AT BEDTIME 30 tablet 11   omeprazole (PRILOSEC) 20 MG capsule Take 20 mg by mouth 2 (two) times daily before a meal.     telmisartan (MICARDIS) 80 MG tablet Take 80 mg by mouth daily.     thiamine (VITAMIN B-1) 100 MG tablet Take 100 mg by mouth daily.     No current facility-administered medications on file prior to visit.    Review of Systems  Constitutional: Positive for malaise/fatigue. Negative for weight loss.  Respiratory: Positive for shortness of breath. Negative for cough and hemoptysis.   Cardiovascular: Negative for chest pain, palpitations, claudication and leg swelling.  Gastrointestinal: Negative for abdominal pain, blood in stool, constipation, heartburn and vomiting.  Genitourinary: Negative for dysuria.  Musculoskeletal: Positive for joint pain. Negative for myalgias.  Neurological: Negative for dizziness, focal weakness and headaches.  Endo/Heme/Allergies: Does not bruise/bleed easily.  Psychiatric/Behavioral: Negative for depression. The patient is not nervous/anxious.   All other systems reviewed and are negative.     Objective:  Blood pressure (!) 136/104, pulse (!) 127, height 5' 6"  (1.676 m), weight 253 lb (114.8  kg). Body mass index is 40.84 kg/m.  Physical Exam  Constitutional: He appears well-developed. No distress. Obese. Neck: Neck supple. No JVD present. No thyromegaly present.   Pulmonary/Chest: Effort normal   Musculoskeletal: Normal range of motion.        General: Edema  present.  Neurological: He is alert.  Psychiatric: He has a normal mood and affect.   Limited exam - video  CARDIAC STUDIES:    Echocardiogram 09/23/2018 :  Poor echo window. Wall motion abnormality has reduced sensitivity. Left ventricle cavity is normal in size. Moderate concentric hypertrophy of the left ventricle. Abnormal septal wall motion  due to left bundle branch block. Cannot exclude anterio septal hypokinesis.  Normal diastolic filling pattern. Visual EF is 30-35%. Left atrial cavity is moderate to severely dilated at 4.7 cm. Moderate (Grade II) mitral regurgitation. Trace tricuspid regurgitation. Unable to estimate PA pressure due to absence/minimal TR signal.  Lexiscan myoview stress test 09/20/2018:  1. Lexiscan stress test was performed. Exercise capacity was not assessed. Stress symptoms included dizziness. Resting blood pressure was 164/108 mmHg and peak effect blood pressure was 174/116 mmHg. The resting and stress electrocardiogram demonstrated atrial fibrillation with rapid ventricular rate, occasional PVC, and normal rest repolarization.  Stress EKG is non diagnostic for ischemia as it is a pharmacologic stress. In addition, no ischemic changes seen at heart rate of 136 bpm.  2. The overall quality of the study is good.  Left ventricular cavity is noted to be enlarged on the rest and stress studies.  Gated SPECT images reveal moderate global decrease in myocardial thickening and wall motion.  The left ventricular ejection fraction was calculated or visually estimated to be 34%.  REST and STRESS images demonstrate mildly decreased tracer uptake in the basal inferior, mid inferior and apical inferior  segments of the left ventricle, with no reversibility. Findings suggestive of dilated nonishcemic cardiomyopathy.  3. High risk study due to reduced LVED. Recommend clinical correlation.   Echocardiogram 05/16/2017: Left ventricle cavity is normal in size. Mild concentric hypertrophy of the left ventricle. Normal global wall motion. LVEF 55-60%. Left atrial cavity is moderate to severely dilated. Mild (Grade I) mitral regurgitation. No significant change compared to prior study dated 08/06/2014.   Assessment & Recommendations:   Paroxysmal atrial fibrillation (HCC)  Dyspnea on exertion  Essential hypertension  bilateral leg edema CHA2DS2-VASc Score is 3.  -(CHF; HTN; vasc disease DM,  Male = 1; Age <65 =0; 65-74 = 1,  >75 =2; stroke = 2).  -(Yearly risk of stroke: Score of 1=1.3; 2=2.2; 3=3.2; 4=4; 5=6.7; 6=9.8; 7=>9.8)  EKG 09/11/2018: Atrial fibrillation with rapid ventricular response at the rate of 136 bpm, left axis deviation, left anterior fascicular block.  Incomplete right bundle branch block.  Nonspecific T abnormality.  Sick sinus syndrome, has underlying sinus bradycardia on on minimal dose of beta blocker, today he is in atrial fibrillation with RVR.  Laboratory examination  Labs 09/10/2018: HB 15.8/HCT 46.3, mild macrocytosis present with MCV of 100.  Platelet count 154.  Potassium 4.9, BUN 24, creatinine 1.1, eGFR greater than 60 mL.  ALT and AST elevated at 64 and 57 respectively.  Total cholesterol 184, triglycerides 199, HDL 85, LDL 59.  Labs 07/12/2017: HB 15.6, HCT 47.8, platelets 155.  Normal indicis.  Potassium 4.8, serum glucose 160 mg, BUN 23, creatinine 1.1.  CMP normal.  Total cholesterol 232, triglycerides 138, HDL 62, LDL 07/10/40.  Non-HDL cholesterol 170.  Uric acid is elevated at 7.8, vitamin D 25 x 1.  Recommendation:   I have reviewed the results of the recently performed echocardiogram which revealed severe LV systolic dysfunction probably related  to A. fib with RVR.  Nuclear stress test does not reveal ischemia.  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  Will increase metoprolol from 25 g twice daily to 50 mg twice daily, will also add amiodarone 200 mg p.o. twice daily and I will set him up for direct-current cardioversion in view of continued symptoms of marked dyspnea even with minimal activity.  I have extensively discussed with the patient regarding the side effects of amiodarone, complications from cardioversion as well.  Patient states that he is extremely symptomatic with marked fatigue and dyspnea. No change in leg edema. Compliant with CPAP.   This visit type was conducted due to national recommendations for restrictions regarding the COVID-19 Pandemic (e.g. social distancing).  This format is felt to be most appropriate for this patient at this time.  All issues noted in this document were discussed and addressed.  No physical exam was performed (except for noted visual exam findings with Telehealth visits).  The patient has consented to conduct a Telehealth visit and understands insurance will be billed.   I connected with@, on 10/03/18 at  by a video enabled telemedicine application and verified that I am speaking with the correct person using two identifiers.     I discussed the limitations of evaluation and management by telemedicine and the availability of in person appointments. The patient expressed understanding and agreed to proceed.   I have discussed with her regarding the safety during COVID Pandemic and steps and precautions including social distancing with the patient. Tthis was a 25 min AV face to face encounter with additional 5 minutes on review of medical records and charting.   [name], MD, [facility] 10/03/2018, 10:42 AM [facility]. [place] Pager: [phone] Office: [phone] If no answer Cell [phone]

## 2018-10-07 ENCOUNTER — Other Ambulatory Visit: Payer: Self-pay

## 2018-10-07 ENCOUNTER — Encounter (HOSPITAL_COMMUNITY): Payer: Self-pay | Admitting: *Deleted

## 2018-10-07 NOTE — Progress Notes (Addendum)
Darren [name] denies chest pain or shortness of breath.  Darren [name] denies that he or any one he has been in contact with demonstrates any S?S of Covid 19 - to  his knowledge. "I do feel more winded than I do usually."  Patient denies palpations, lightheadedness. "The only way I know it is elevated is by my fitbit."

## 2018-10-08 ENCOUNTER — Ambulatory Visit (HOSPITAL_COMMUNITY)
Admission: RE | Admit: 2018-10-08 | Discharge: 2018-10-08 | Disposition: A | Discharge status: Home / Self Care | Payer: Medicare Other | Attending: Cardiology | Admitting: Cardiology

## 2018-10-08 ENCOUNTER — Encounter (HOSPITAL_COMMUNITY)
Admission: RE | Disposition: A | Discharge status: Home / Self Care | Payer: Self-pay | Source: Home / Self Care | Attending: Cardiology

## 2018-10-08 ENCOUNTER — Ambulatory Visit (HOSPITAL_COMMUNITY): Payer: Medicare Other | Admitting: Certified Registered"

## 2018-10-08 ENCOUNTER — Encounter (HOSPITAL_COMMUNITY): Payer: Self-pay | Admitting: *Deleted

## 2018-10-08 DIAGNOSIS — E119 Type 2 diabetes mellitus without complications: Secondary | ICD-10-CM | POA: Diagnosis not present

## 2018-10-08 DIAGNOSIS — I480 Paroxysmal atrial fibrillation: Secondary | ICD-10-CM | POA: Insufficient documentation

## 2018-10-08 DIAGNOSIS — G4733 Obstructive sleep apnea (adult) (pediatric): Secondary | ICD-10-CM | POA: Diagnosis not present

## 2018-10-08 DIAGNOSIS — Z79899 Other long term (current) drug therapy: Secondary | ICD-10-CM | POA: Insufficient documentation

## 2018-10-08 DIAGNOSIS — I10 Essential (primary) hypertension: Secondary | ICD-10-CM | POA: Diagnosis not present

## 2018-10-08 DIAGNOSIS — I4510 Unspecified right bundle-branch block: Secondary | ICD-10-CM | POA: Diagnosis not present

## 2018-10-08 DIAGNOSIS — Z87891 Personal history of nicotine dependence: Secondary | ICD-10-CM | POA: Insufficient documentation

## 2018-10-08 DIAGNOSIS — Z7901 Long term (current) use of anticoagulants: Secondary | ICD-10-CM | POA: Diagnosis not present

## 2018-10-08 DIAGNOSIS — J45909 Unspecified asthma, uncomplicated: Secondary | ICD-10-CM | POA: Insufficient documentation

## 2018-10-08 DIAGNOSIS — E785 Hyperlipidemia, unspecified: Secondary | ICD-10-CM | POA: Insufficient documentation

## 2018-10-08 HISTORY — DX: Gout, unspecified: M10.9

## 2018-10-08 HISTORY — PX: CARDIOVERSION: SHX1299

## 2018-10-08 HISTORY — DX: Cardiac arrhythmia, unspecified: I49.9

## 2018-10-08 HISTORY — DX: Fatty (change of) liver, not elsewhere classified: K76.0

## 2018-10-08 HISTORY — DX: Prediabetes: R73.03

## 2018-10-08 HISTORY — DX: Gastrointestinal hemorrhage, unspecified: K92.2

## 2018-10-08 HISTORY — DX: Unspecified asthma, uncomplicated: J45.909

## 2018-10-08 HISTORY — DX: Malignant (primary) neoplasm, unspecified: C80.1

## 2018-10-08 HISTORY — DX: Personal history of other medical treatment: Z92.89

## 2018-10-08 HISTORY — DX: Dyspnea, unspecified: R06.00

## 2018-10-08 HISTORY — DX: Gastro-esophageal reflux disease without esophagitis: K21.9

## 2018-10-08 HISTORY — DX: Unspecified osteoarthritis, unspecified site: M19.90

## 2018-10-08 LAB — BASIC METABOLIC PANEL
Anion gap: 7 (ref 5–15)
BUN: 29 mg/dL — ABNORMAL HIGH (ref 8–23)
CO2: 24 mmol/L (ref 22–32)
Calcium: 9 mg/dL (ref 8.9–10.3)
Chloride: 107 mmol/L (ref 98–111)
Creatinine, Ser: 1.28 mg/dL — ABNORMAL HIGH (ref 0.61–1.24)
GFR calc Af Amer: >60 mL/min (ref >60)
GFR calc non Af Amer: 54 mL/min — ABNORMAL LOW (ref >60)
Glucose, Bld: 130 mg/dL — ABNORMAL HIGH (ref 70–99)
Potassium: 4.6 mmol/L (ref 3.5–5.1)
Sodium: 138 mmol/L (ref 135–145)

## 2018-10-08 SURGERY — CARDIOVERSION
Anesthesia: General

## 2018-10-08 MED ORDER — LIDOCAINE 2% (20 MG/ML) 5 ML SYRINGE
INTRAMUSCULAR | Status: DC | PRN
  Administered 2018-10-08: 100 mg via INTRAVENOUS

## 2018-10-08 MED ORDER — PROPOFOL 10 MG/ML IV BOLUS
INTRAVENOUS | Status: DC | PRN
  Administered 2018-10-08: 100 mg via INTRAVENOUS

## 2018-10-08 MED ORDER — METOPROLOL TARTRATE 50 MG PO TABS: 50.0000 mg | ORAL_TABLET | Freq: Two times a day (BID) | ORAL | 2 refills | Status: DC

## 2018-10-08 MED ORDER — ONDANSETRON HCL 4 MG/2ML IJ SOLN: 4.0000 mg | Freq: Once | INTRAMUSCULAR | Status: DC | PRN

## 2018-10-08 MED ORDER — FENTANYL CITRATE (PF) 100 MCG/2ML IJ SOLN: 25.0000 ug | INTRAMUSCULAR | Status: DC | PRN

## 2018-10-08 MED ORDER — SODIUM CHLORIDE 0.9 % IV SOLN
INTRAVENOUS | Status: DC | PRN
  Administered 2018-10-08: 09:00:00 via INTRAVENOUS

## 2018-10-08 NOTE — Transfer of Care (Signed)
Immediate Anesthesia Transfer of Care Note  Patient: Darren [name]  Procedure(s) Performed: CARDIOVERSION (N/A )  Patient Location: Endoscopy Unit  Anesthesia Type:General  Level of Consciousness: drowsy and patient cooperative  Airway & Oxygen Therapy: Patient Spontanous Breathing  Post-op Assessment: Report given to RN and Post -op Vital signs reviewed and stable  Post vital signs: Reviewed and stable  Last Vitals:  Vitals Value Taken Time  BP    Temp    Pulse    Resp    SpO2      Last Pain:  Vitals:   10/08/18 0826  TempSrc: Oral  PainSc: 0-No pain         Complications: No apparent anesthesia complications

## 2018-10-08 NOTE — CV Procedure (Signed)
Direct current cardioversion:  Indication symptomatic A. Fibrillation.  Procedure: Using 100 mg of IV Propofol and 100 IV Lidocaine (for reducing venous pain) for achieving deep sedation, synchronized direct current cardioversion performed. Patient was delivered with 120 x1, 150 x 1 and then 200 Joules of electricity X 1 with success to NSR. Patient tolerated the procedure well. No immediate complication noted.  [name], MD, [facility] 10/08/2018, 10:25 AM [facility]. [place] Pager: [phone] Office: [phone] If no answer Cell [phone]

## 2018-10-08 NOTE — Discharge Instructions (Signed)
Electrical Cardioversion, Care After °This sheet gives you information about how to care for yourself after your procedure. Your health care provider may also give you more specific instructions. If you have problems or questions, contact your health care provider. °What can I expect after the procedure? °After the procedure, it is common to have: °· Some redness on the skin where the shocks were given. °Follow these instructions at home: ° °· Do not drive for 24 hours if you were given a medicine to help you relax (sedative). °· Take over-the-counter and prescription medicines only as told by your health care provider. °· Ask your health care provider how to check your pulse. Check it often. °· Rest for 48 hours after the procedure or as told by your health care provider. °· Avoid or limit your caffeine use as told by your health care provider. °Contact a health care provider if: °· You feel like your heart is beating too quickly or your pulse is not regular. °· You have a serious muscle cramp that does not go away. °Get help right away if: ° °· You have discomfort in your chest. °· You are dizzy or you feel faint. °· You have trouble breathing or you are short of breath. °· Your speech is slurred. °· You have trouble moving an arm or leg on one side of your body. °· Your fingers or toes turn cold or blue. °This information is not intended to replace advice given to you by your health care provider. Make sure you discuss any questions you have with your health care provider. °Document Released: 04/16/2013 Document Revised: 01/28/2016 Document Reviewed: 12/31/2015 °Elsevier Interactive Patient Education © 2019 [employer] ° °

## 2018-10-08 NOTE — Anesthesia Preprocedure Evaluation (Signed)
Anesthesia Evaluation  Patient identified by MRN, date of birth, ID band Patient awake    Reviewed: Allergy & Precautions, NPO status , Patient's Chart, lab work & pertinent test results  Airway Mallampati: III  TM Distance: <3 FB Neck ROM: Full    Dental  (+) Teeth Intact, Dental Advisory Given   Pulmonary asthma , sleep apnea and Continuous Positive Airway Pressure Ventilation , COPD, former smoker,    Pulmonary exam normal breath sounds clear to auscultation       Cardiovascular hypertension, Pt. on medications + dysrhythmias Atrial Fibrillation  Rhythm:Irregular Rate:Abnormal     Neuro/Psych negative neurological ROS  negative psych ROS   GI/Hepatic Neg liver ROS, GERD  ,  Endo/Other  Morbid obesity  Renal/GU negative Renal ROS     Musculoskeletal  (+) Arthritis ,   Abdominal   Peds  Hematology  (+) Blood dyscrasia (Eliquis), ,   Anesthesia Other Findings Day of surgery medications reviewed with the patient.  Reproductive/Obstetrics                             Anesthesia Physical Anesthesia Plan  ASA: III  Anesthesia Plan: General   Post-op Pain Management:    Induction: Intravenous  PONV Risk Score and Plan: 2 and Treatment may vary due to age or medical condition  Airway Management Planned: Mask  Additional Equipment:   Intra-op Plan:   Post-operative Plan:   Informed Consent: I have reviewed the patients History and Physical, chart, labs and discussed the procedure including the risks, benefits and alternatives for the proposed anesthesia with the patient or authorized representative who has indicated his/her understanding and acceptance.     Dental advisory given  Plan Discussed with: CRNA  Anesthesia Plan Comments:         Anesthesia Quick Evaluation

## 2018-10-08 NOTE — Anesthesia Postprocedure Evaluation (Signed)
Anesthesia Post Note  Patient: Darren [name]  Procedure(s) Performed: CARDIOVERSION (N/A )     Patient location during evaluation: PACU Anesthesia Type: General Level of consciousness: awake and alert Pain management: pain level controlled Vital Signs Assessment: post-procedure vital signs reviewed and stable Respiratory status: spontaneous breathing, nonlabored ventilation and respiratory function stable Cardiovascular status: blood pressure returned to baseline and stable Postop Assessment: no apparent nausea or vomiting Anesthetic complications: no    Last Vitals:  Vitals:   10/08/18 1055 10/08/18 1100  BP: 103/74 111/76  Pulse: 62 62  Resp: 20 15  Temp:    SpO2: 96% 97%    Last Pain:  Vitals:   10/08/18 1030  TempSrc: Oral  PainSc: 0-No pain                 Lynda Rainwater

## 2018-10-08 NOTE — Interval H&P Note (Signed)
History and Physical Interval Note:  10/08/2018 10:20 AM  Darren [name]  has presented today for surgery, with the diagnosis of a fib.  The various methods of treatment have been discussed with the patient and family. After consideration of risks, benefits and other options for treatment, the patient has consented to  Procedure(s): CARDIOVERSION (N/A) as a surgical intervention.  The patient's history has been reviewed, patient examined, no change in status, stable for surgery.  I have reviewed the patient's chart and labs.  Questions were answered to the patient's satisfaction.   Cardiac exam: Tachycardic. S1 variable and S2 normqal. no murmur. Lungs clear Blood pressure (!) 142/105, pulse (!) 127, temperature 98.8 F (37.1 C), temperature source Oral, resp. rate (!) 22, SpO2 97 %.    [name]

## 2018-10-08 NOTE — Anesthesia Procedure Notes (Addendum)
Procedure Name: General with mask airway Date/Time: 10/08/2018 10:22 AM Performed by: Orlie Dakin, CRNA Pre-anesthesia Checklist: Patient identified, Emergency Drugs available, Suction available and Patient being monitored Patient Re-evaluated:Patient Re-evaluated prior to induction Oxygen Delivery Method: Ambu bag Preoxygenation: Pre-oxygenation with 100% oxygen Induction Type: IV induction

## 2018-10-09 ENCOUNTER — Encounter (HOSPITAL_COMMUNITY): Payer: Self-pay | Admitting: Cardiology

## 2018-10-09 LAB — POCT I-STAT 4, (NA,K, GLUC, HGB,HCT)
Glucose, Bld: 128 mg/dL — ABNORMAL HIGH (ref 70–99)
HCT: 48 % (ref 39.0–52.0)
Hemoglobin: 16.3 g/dL (ref 13.0–17.0)
POTASSIUM: 6 mmol/L — AB (ref 3.5–5.1)
Sodium: 133 mmol/L — ABNORMAL LOW (ref 135–145)

## 2018-10-17 ENCOUNTER — Ambulatory Visit (INDEPENDENT_AMBULATORY_CARE_PROVIDER_SITE_OTHER): Payer: Medicare Other | Admitting: Cardiology

## 2018-10-17 ENCOUNTER — Encounter: Payer: Self-pay | Admitting: Cardiology

## 2018-10-17 ENCOUNTER — Other Ambulatory Visit: Payer: Self-pay

## 2018-10-17 ENCOUNTER — Telehealth: Payer: Self-pay

## 2018-10-17 ENCOUNTER — Ambulatory Visit: Payer: Medicare Other | Admitting: Cardiology

## 2018-10-17 VITALS — BP 156/83 | HR 65 | Ht 71.0 in | Wt 255.5 lb

## 2018-10-17 DIAGNOSIS — I480 Paroxysmal atrial fibrillation: Secondary | ICD-10-CM | POA: Diagnosis not present

## 2018-10-17 DIAGNOSIS — I129 Hypertensive chronic kidney disease with stage 1 through stage 4 chronic kidney disease, or unspecified chronic kidney disease: Secondary | ICD-10-CM | POA: Diagnosis not present

## 2018-10-17 DIAGNOSIS — E7800 Pure hypercholesterolemia, unspecified: Secondary | ICD-10-CM

## 2018-10-17 DIAGNOSIS — N183 Chronic kidney disease, stage 3 unspecified: Secondary | ICD-10-CM

## 2018-10-17 DIAGNOSIS — I429 Cardiomyopathy, unspecified: Secondary | ICD-10-CM

## 2018-10-17 DIAGNOSIS — I10 Essential (primary) hypertension: Secondary | ICD-10-CM

## 2018-10-17 DIAGNOSIS — R0609 Other forms of dyspnea: Secondary | ICD-10-CM | POA: Diagnosis not present

## 2018-10-17 DIAGNOSIS — I5042 Chronic combined systolic (congestive) and diastolic (congestive) heart failure: Secondary | ICD-10-CM

## 2018-10-17 MED ORDER — APIXABAN 5 MG PO TABS: 5.0000 mg | ORAL_TABLET | Freq: Two times a day (BID) | ORAL | 1 refills | Status: DC

## 2018-10-17 MED ORDER — AMLODIPINE BESYLATE 5 MG PO TABS: 5.0000 mg | ORAL_TABLET | Freq: Every day | ORAL | 3 refills | Status: DC

## 2018-10-17 MED ORDER — SPIRONOLACTONE 25 MG PO TABS: 25.0000 mg | ORAL_TABLET | Freq: Every day | ORAL | 2 refills | Status: DC

## 2018-10-17 NOTE — Telephone Encounter (Signed)
Pt stated that he has an intolerance to Norvasc it makes his legs swell would like something else. Please advise

## 2018-10-17 NOTE — Telephone Encounter (Signed)
Lets try SPIRONOLACTONE 25 MG DAILY. I HAVE SENT THE rX

## 2018-10-17 NOTE — Progress Notes (Signed)
Subjective:  Primary Physician:  [name], MD  Patient ID: Darren [name], male    DOB: May 12, 1941, 78 y.o.   MRN: [id number]  Chief Complaint  Patient presents with  . Atrial Fibrillation  . Hypertension  . Follow-up    7-10 days fu cardioversion    HPI: Darren [name]  is a 78 y.o. male  with  chronic shortness of breath and paroxysmal atrial fibrillation, was seen by me on 09/09/2018 for worsening dyspnea on exertion, was found to be in A. fib with RVR.  He is presently on Xarelto for paroxysmal atrial fibrillation.  Last episode of GI bleed was in August 2018, felt to have mild gastric mucosal irritation.  Past medical history significant for obstructive sleep apnea, compliant with CPAP, morbid obesity, underlying marked sinus bradycardia even on minimal dose of beta-blocker, hyperlipidemia, bronchial asthma and also has history of excessive alcohol intake.  With due to symptomatic atrial fibrillation, he underwent direct-current cardioversion on 10/08/2018, needed 120 x1, 150 x 1 and then 200 Joules of electricity X 1 with success to NSR.  He is in office today for follow-up of atrial fibrillation. He hasnoticed marked improvement in fatigue and dyspnea. Feels well and tolerating all his medications well including Metoprolol at 50 mg BID but reduced it to 25 mg BID yesterday.   Past Medical History:  Diagnosis Date  . Arthritis    "maybe a little"  . Asthma    "mild"  . Atrial fibrillation ([place])   . bilateral leg edema 09/11/2018  . Cancer [facility])    prostate cancer  . Dyspnea   . Dyspnea on exertion 09/11/2018  . Dysrhythmia    Afib with rapid ventricular  . Essential hypertension 07/25/2018  . Fatty liver   . GERD (gastroesophageal reflux disease)   . GI bleed 2018  . Gout   . History of blood transfusion 2018  . Obstructive sleep apnea 09/11/2018  . OSA (obstructive sleep apnea)   . Paroxysmal atrial fibrillation ([place]) 09/11/2018  . Pre-diabetes     Past Surgical  History:  Procedure Laterality Date  . CARDIOVERSION N/A 10/08/2018   Procedure: CARDIOVERSION;  Surgeon: [name], MD;  Location: [facility];  Service: Cardiovascular;  Laterality: N/A;  . COLONOSCOPY Left 03/10/2017   Procedure: COLONOSCOPY;  Surgeon: Arta Silence, MD;  Location: [facility] END[facility]COPY;  Service: Endoscopy;  Laterality: Left;  . ESOPHAGOGASTRODUODENOSCOPY (EGD) WITH PROPOFOL Left 03/09/2017   Procedure: ESOPHAGOGASTRODUODENOSCOPY (EGD) WITH PROPOFOL;  Surgeon: Ronnette Juniper, MD;  Location: [facility];  Service: Gastroenterology;  Laterality: Left;  . EYE SURGERY Bilateral    cataract   . FINGER SURGERY Right    5th  . Prostate     DaVinci    Social History   Socioeconomic History  . Marital status: Married    Spouse name: Not on file  . Number of children: 4  . Years of education: Not on file  . Highest education level: Not on file  Occupational History  . Not on file  Social Needs  . Financial resource strain: Not on file  . Food insecurity:    Worry: Not on file    Inability: Not on file  . Transportation needs:    Medical: Not on file    Non-medical: Not on file  Tobacco Use  . Smoking status: Former Smoker    Packs/day: 1.00    Years: 25.00    Pack years: 25.00    Last attempt to quit: 07/10/1988  Years since quitting: 30.2  . Smokeless tobacco: Never Used  Substance and Sexual Activity  . Alcohol use: Yes    Alcohol/week: 25.0 standard drinks    Types: 25 Shots of liquor per week  . Drug use: No  . Sexual activity: Not on file  Lifestyle  . Physical activity:    Days per week: Not on file    Minutes per session: Not on file  . Stress: Not on file  Relationships  . Social connections:    Talks on phone: Not on file    Gets together: Not on file    Attends religious service: Not on file    Active member of club or organization: Not on file    Attends meetings of clubs or organizations: Not on file    Relationship status: Not on file  .  Intimate partner violence:    Fear of current or ex partner: Not on file    Emotionally abused: Not on file    Physically abused: Not on file    Forced sexual activity: Not on file  Other Topics Concern  . Not on file  Social History Narrative  . Not on file    Current Outpatient Medications on File Prior to Visit  Medication Sig Dispense Refill  . allopurinol (ZYLOPRIM) 100 MG tablet Take 100 mg by mouth daily.     [name] atorvastatin (LIPITOR) 40 MG tablet Take 40 mg by mouth daily.     . Cholecalciferol (VITAMIN D-3 PO) Take 2,000 Units by mouth daily.     . colchicine 0.6 MG tablet Take 0.6 mg by mouth daily as needed (gout).     . colesevelam (WELCHOL) 625 MG tablet Take 1,875 mg by mouth 2 (two) times daily with a meal.     . L-Methylfolate-Algae-B12-B6 (METANX) 3-90.314-2-35 MG CAPS Take 1 tablet by mouth 2 (two) times daily.     . Magnesium 400 MG TABS Take 400 mg by mouth daily.     . metoprolol tartrate (LOPRESSOR) 50 MG tablet Take 1 tablet (50 mg total) by mouth 2 (two) times daily. Take two tablets twice daily for 2 days 60 tablet 2  . mometasone-formoterol (DULERA) 100-5 MCG/ACT AERO Inhale 2 puffs into the lungs 2 (two) times daily. 3 Inhaler 3  . montelukast (SINGULAIR) 10 MG tablet TAKE 1 TABLET BY MOUTH AT BEDTIME (Patient taking differently: Take 10 mg by mouth at bedtime. ) 30 tablet 11  . omeprazole (PRILOSEC) 20 MG capsule Take 20 mg by mouth 2 (two) times daily before a meal.    . Polyethyl Glycol-Propyl Glycol (SYSTANE OP) Place 1 drop into both eyes 3 (three) times daily as needed (dry eyes).    [name] telmisartan (MICARDIS) 80 MG tablet Take 80 mg by mouth daily.    [name] thiamine (VITAMIN B-1) 100 MG tablet Take 100 mg by mouth daily.     No current facility-administered medications on file prior to visit.    Review of Systems  Constitutional: Positive for malaise/fatigue (improving). Negative for weight loss.  Respiratory: Positive for shortness of breath (improving).  Negative for cough and hemoptysis.   Cardiovascular: Negative for chest pain, palpitations, claudication and leg swelling.  Gastrointestinal: Negative for abdominal pain, blood in stool, constipation, heartburn and vomiting.  Genitourinary: Negative for dysuria.  Musculoskeletal: Positive for joint pain. Negative for myalgias.  Neurological: Negative for dizziness, focal weakness and headaches.  Endo/Heme/Allergies: Does not bruise/bleed easily.  Psychiatric/Behavioral: Negative for depression. The patient is not nervous/anxious.  All other systems reviewed and are negative.   Physical Exam  Constitutional: He appears well-developed. No distress.  HENT:  Head: Atraumatic.  Eyes: Conjunctivae are normal.  Neck: Neck supple. No JVD present. No thyromegaly present.  Cardiovascular: Regular rhythm and normal heart sounds. Tachycardia present. Exam reveals no gallop.  No murmur heard. Pulses:      Carotid pulses are 2+ on the right side and 2+ on the left side.      Dorsalis pedis pulses are 2+ on the right side and 2+ on the left side.       Posterior tibial pulses are 2+ on the right side and 2+ on the left side.  Femoral and popliteal pulse difficult to feel due to patient's body habitus.   Pulmonary/Chest: Effort normal and breath sounds normal.  Abdominal: Soft. Bowel sounds are normal.  Musculoskeletal: Normal range of motion.        General: Edema (2 + pitting at ankle) present.  Neurological: He is alert.  Skin: Skin is warm and dry.  Psychiatric: He has a normal mood and affect.   Laboratory examination  BMP Latest Ref Rng & Units 10/08/2018 10/08/2018 03/08/2017  Glucose 70 - 99 mg/dL 130(H) 128(H) 105(H)  BUN 8 - 23 mg/dL 29(H) - 19  Creatinine 0.61 - 1.24 mg/dL 1.28(H) - 1.21  Sodium 135 - 145 mmol/L 138 133(L) 142  Potassium 3.5 - 5.1 mmol/L 4.6 6.0(H) 4.0  Chloride 98 - 111 mmol/L 107 - 115(H)  CO2 22 - 32 mmol/L 24 - 22  Calcium 8.9 - 10.3 mg/dL 9.0 - 8.1(L)   Labs  09/10/2018: HB 15.8/HCT 46.3, mild macrocytosis present with MCV of 100.  Platelet count 154.  Potassium 4.9, BUN 24, creatinine 1.1, eGFR greater than 60 mL.  ALT and AST elevated at 64 and 57 respectively.  Total cholesterol 184, triglycerides 199, HDL 85, LDL 59.  Labs 07/12/2017: HB 15.6, HCT 47.8, platelets 155.  Normal indicis.  Potassium 4.8, serum glucose 160 mg, BUN 23, creatinine 1.1.  CMP normal.  Total cholesterol 232, triglycerides 138, HDL 62, LDL 07/10/40.  Non-HDL cholesterol 170.  Uric acid is elevated at 7.8, vitamin D 25 x 1.  CARDIAC STUDIES:   Echocardiogram 09/23/2018 :  Poor echo window. Wall motion abnormality has reduced sensitivity. Left ventricle cavity is normal in size. Moderate concentric hypertrophy of the left ventricle. Abnormal septal wall motion due to left bundle branch block. Cannot exclude anterio septal hypokinesis.  Normal diastolic filling pattern. Visual EF is 30-35%. Left atrial cavity is moderate to severely dilated at 4.7 cm. Moderate (Grade II) mitral regurgitation. Trace tricuspid regurgitation. Unable to estimate PA pressure due to absence/minimal TR signal.  Lexiscan myoview stress test 09/20/2018:  1. Lexiscan stress test was performed. Exercise capacity was not assessed. Stress symptoms included dizziness. Resting blood pressure was 164/108 mmHg and peak effect blood pressure was 174/116 mmHg. The resting and stress electrocardiogram demonstrated atrial fibrillation with rapid ventricular rate, occasional PVC, and normal rest repolarization.  Stress EKG is non diagnostic for ischemia as it is a pharmacologic stress. In addition, no ischemic changes seen at heart rate of 136 bpm.  2. The overall quality of the study is good.  Left ventricular cavity is noted to be enlarged on the rest and stress studies.  Gated SPECT images reveal moderate global decrease in myocardial thickening and wall motion.  The left ventricular ejection fraction was calculated or  visually estimated to be 34%.  REST and STRESS  images demonstrate mildly decreased tracer uptake in the basal inferior, mid inferior and apical inferior segments of the left ventricle, with no reversibility. Findings suggestive of dilated nonishcemic cardiomyopathy.  3. High risk study due to reduced LVED. Recommend clinical correlation.   Assessment & Recommendations:   Paroxysmal atrial fibrillation ([place]) - Plan: EKG 12-Lead  Cardiomyopathy, unspecified type ([place])  Essential hypertension - Plan: DISCONTINUED: amLODipine (NORVASC) 5 MG tablet  Dyspnea on exertion  Chronic kidney disease, stage 3 (moderate) ([facility]) - Plan: Basic Metabolic Panel (BMET)  bilateral leg edema  Chronic combined systolic and diastolic heart failure ([place]) - Plan: PCV ECHOCARDIOGRAM COMPLETECHA2DS2-VASc Score is 3.  -(CHF; HTN; vasc disease DM,  Male = 1; Age <65 =0; 65-74 = 1,  >75 =2; stroke = 2).  -(Yearly risk of stroke: Score of 1=1.3; 2=2.2; 3=3.2; 4=4; 5=6.7; 6=9.8; 7=>9.8)  EKG 10/17/2018: Normal sinus rhythm at the rate of 74 bpm, left axis deviation, left anterior fascicular block.  IVCD, LVH.  Single pair of Ventricular couplets.  Normal QT interval.  Nonspecific T abnormality.  EKG 09/11/2018: Atrial fibrillation with rapid ventricular response at the rate of 136 bpm, left axis deviation, left anterior fascicular block.  Incomplete right bundle branch block.  Nonspecific T abnormality.  Sick sinus syndrome, has underlying sinus bradycardia on on minimal dose of beta blocker, today he is in atrial fibrillation with RVR.  Recommendation:   I have reviewed the results of the recently performed echocardiogram which revealed severe LV systolic dysfunction probably related to A. fib with RVR.  Nuclear stress test does not reveal ischemia.  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  He is presently doing well, there is no clinical evidence of acute decompensated heart failure, leg edema is remained stable and  dyspnea is improved.  I have advised him to continue metoprolol at 50 mg p.o. b.i.d.  I would have a low threshold to start him on amiodarone in view of marked left atrial enlargement.  Weight loss and the importance of weight loss and maintaining sinus rhythm was discussed.  His blood pressure is also elevated today and also with multiple blood pressure recordings from home and has been extremely high as high as 170 mmHg.  I initially prescribed amlodipine but realized that patient has severe leg edema with amlodipine and he called back from home.  I discontinued amlodipine and switch him to spironolactone 25 mg every morning, we discussed regarding renal failure, a BMP will be obtained in 2 weeks.  I discussed with him regarding stage III chronic kidney disease.  I'd like to see him back in 3 months, we will repeat echocardiogram prior to his office visit to reevaluate his LV systolic function in view of chronic systolic and diastolic heart failure.  [name], MD, [facility] 10/17/2018, 5:45 PM [facility]. [place] Pager: [phone] Office: [phone] If no answer Cell [phone]

## 2018-10-22 ENCOUNTER — Ambulatory Visit: Payer: Medicare Other | Admitting: Internal Medicine

## 2018-10-22 ENCOUNTER — Other Ambulatory Visit: Payer: Self-pay | Admitting: Cardiology

## 2018-10-22 DIAGNOSIS — I10 Essential (primary) hypertension: Secondary | ICD-10-CM

## 2018-10-22 MED ORDER — SPIRONOLACTONE 50 MG PO TABS: 50.0000 mg | ORAL_TABLET | Freq: Every day | ORAL | 2 refills | Status: DC

## 2018-11-01 ENCOUNTER — Other Ambulatory Visit: Payer: Self-pay | Admitting: Cardiology

## 2018-11-01 DIAGNOSIS — I10 Essential (primary) hypertension: Secondary | ICD-10-CM

## 2018-11-01 LAB — BASIC METABOLIC PANEL
BUN/Creatinine Ratio: 28 — ABNORMAL HIGH (ref 10–24)
BUN: 33 mg/dL — ABNORMAL HIGH (ref 8–27)
CO2: 24 mmol/L (ref 20–29)
Calcium: 9.8 mg/dL (ref 8.6–10.2)
Chloride: 102 mmol/L (ref 96–106)
Creatinine, Ser: 1.18 mg/dL (ref 0.76–1.27)
GFR calc Af Amer: 69 mL/min/{1.73_m2} (ref >59)
GFR calc non Af Amer: 60 mL/min/{1.73_m2} (ref >59)
Glucose: 118 mg/dL — ABNORMAL HIGH (ref 65–99)
Potassium: 5.6 mmol/L — ABNORMAL HIGH (ref 3.5–5.2)
Sodium: 143 mmol/L (ref 134–144)

## 2018-11-01 NOTE — Progress Notes (Signed)
Let a message for pt to call back

## 2018-11-14 ENCOUNTER — Other Ambulatory Visit: Payer: Self-pay | Admitting: Cardiology

## 2018-11-14 DIAGNOSIS — E875 Hyperkalemia: Secondary | ICD-10-CM

## 2018-11-14 DIAGNOSIS — I10 Essential (primary) hypertension: Secondary | ICD-10-CM

## 2018-11-14 NOTE — Telephone Encounter (Signed)
From pt

## 2018-11-15 LAB — BASIC METABOLIC PANEL
BUN/Creatinine Ratio: 24 (ref 10–24)
BUN: 33 mg/dL — ABNORMAL HIGH (ref 8–27)
CO2: 17 mmol/L — ABNORMAL LOW (ref 20–29)
Calcium: 9.9 mg/dL (ref 8.6–10.2)
Chloride: 103 mmol/L (ref 96–106)
Creatinine, Ser: 1.35 mg/dL — ABNORMAL HIGH (ref 0.76–1.27)
GFR calc Af Amer: 59 mL/min/{1.73_m2} — ABNORMAL LOW (ref >59)
GFR calc non Af Amer: 51 mL/min/{1.73_m2} — ABNORMAL LOW (ref >59)
Glucose: 110 mg/dL — ABNORMAL HIGH (ref 65–99)
Potassium: 5.7 mmol/L — ABNORMAL HIGH (ref 3.5–5.2)
Sodium: 137 mmol/L (ref 134–144)

## 2018-11-15 NOTE — Addendum Note (Signed)
Addended by: Kela Millin on: 11/15/2018 06:15 AM   Modules accepted: Orders

## 2018-11-18 ENCOUNTER — Telehealth: Payer: Self-pay

## 2018-11-18 NOTE — Telephone Encounter (Signed)
The patient called and aked about you decreasing his dose of Spironalactone

## 2018-11-18 NOTE — Telephone Encounter (Signed)
Please check: I sent him this message  Notes recorded by [name], MD on 11/15/2018 at 6:12 AM EDT Potassium level is still high. Reduce Spironolactone to 25 mg daily and recheck in 2 weeks. I have placed orders.

## 2018-11-20 ENCOUNTER — Other Ambulatory Visit: Payer: Self-pay

## 2018-11-20 DIAGNOSIS — I10 Essential (primary) hypertension: Secondary | ICD-10-CM

## 2018-11-20 MED ORDER — SPIRONOLACTONE 25 MG PO TABS: 25.0000 mg | ORAL_TABLET | Freq: Every day | ORAL | 0 refills | Status: DC

## 2018-12-03 ENCOUNTER — Other Ambulatory Visit: Payer: Self-pay | Admitting: Cardiology

## 2018-12-04 LAB — BASIC METABOLIC PANEL
BUN/Creatinine Ratio: 27 — ABNORMAL HIGH (ref 10–24)
BUN: 33 mg/dL — ABNORMAL HIGH (ref 8–27)
CO2: 18 mmol/L — ABNORMAL LOW (ref 20–29)
Calcium: 9.1 mg/dL (ref 8.6–10.2)
Chloride: 106 mmol/L (ref 96–106)
Creatinine, Ser: 1.24 mg/dL (ref 0.76–1.27)
GFR calc Af Amer: 65 mL/min/{1.73_m2} (ref >59)
GFR calc non Af Amer: 56 mL/min/{1.73_m2} — ABNORMAL LOW (ref >59)
Glucose: 132 mg/dL — ABNORMAL HIGH (ref 65–99)
Potassium: 5 mmol/L (ref 3.5–5.2)
Sodium: 144 mmol/L (ref 134–144)

## 2018-12-17 ENCOUNTER — Ambulatory Visit (INDEPENDENT_AMBULATORY_CARE_PROVIDER_SITE_OTHER): Payer: Medicare Other | Admitting: Cardiology

## 2018-12-17 ENCOUNTER — Other Ambulatory Visit: Payer: Self-pay

## 2018-12-17 ENCOUNTER — Encounter: Payer: Self-pay | Admitting: Cardiology

## 2018-12-17 VITALS — BP 126/93 | HR 120 | Temp 97.0°F | Ht 71.0 in | Wt 246.0 lb

## 2018-12-17 DIAGNOSIS — I480 Paroxysmal atrial fibrillation: Secondary | ICD-10-CM

## 2018-12-17 DIAGNOSIS — I428 Other cardiomyopathies: Secondary | ICD-10-CM | POA: Diagnosis not present

## 2018-12-17 DIAGNOSIS — I5042 Chronic combined systolic (congestive) and diastolic (congestive) heart failure: Secondary | ICD-10-CM | POA: Diagnosis not present

## 2018-12-17 DIAGNOSIS — I10 Essential (primary) hypertension: Secondary | ICD-10-CM | POA: Diagnosis not present

## 2018-12-17 MED ORDER — SOTALOL HCL 80 MG PO TABS: 80.0000 mg | ORAL_TABLET | Freq: Two times a day (BID) | ORAL | 1 refills | Status: DC

## 2018-12-17 NOTE — Progress Notes (Signed)
Subjective:  Primary Physician:  [name], MD  Patient ID: Darren [name], male    DOB: [birth year], 78 y.o.   MRN: [id number]  Chief Complaint  Patient presents with   Atrial Fibrillation   Tachycardia    HPI: Darren [name]  is a 78 y.o. male  with  chronic shortness of breath and paroxysmal atrial fibrillation, He is presently on Xarelto for paroxysmal atrial fibrillation.  Last episode of GI bleed was in August 2018, felt to have mild gastric mucosal irritation.  Past medical history significant for obstructive sleep apnea, compliant with CPAP, morbid obesity, underlying marked sinus bradycardia even on minimal dose of beta-blocker, hyperlipidemia, bronchial asthma and also has history of excessive alcohol intake.  With due to symptomatic atrial fibrillation, he underwent direct-current cardioversion on 10/08/2018, needed 120 x1, 150 x 1 and then 200 Joules of electricity X 1 with success to NSR.  He is in office today for follow-up of atrial fibrillation. On his last office visit he was feeling well and was in sinus rhythm.  He states that he has started to feel short of breath again or the past one week.  No chest pain.  Does not feel any palpitations.  Past Medical History:  Diagnosis Date   Arthritis    "maybe a little"   Asthma    "mild"   Atrial fibrillation ([place])    bilateral leg edema 09/11/2018   Cancer (HCC)    prostate cancer   Dyspnea    Dyspnea on exertion 09/11/2018   Dysrhythmia    Afib with rapid ventricular   Essential hypertension 07/25/2018   Fatty liver    GERD (gastroesophageal reflux disease)    GI bleed 2018   Gout    History of blood transfusion 2018   Obstructive sleep apnea 09/11/2018   OSA (obstructive sleep apnea)    Paroxysmal atrial fibrillation ([place]) 09/11/2018   Pre-diabetes     Past Surgical History:  Procedure Laterality Date   CARDIOVERSION N/A 10/08/2018   Procedure: CARDIOVERSION;  Surgeon: [name], MD;   Location: [facility];  Service: Cardiovascular;  Laterality: N/A;   COLONOSCOPY Left 03/10/2017   Procedure: COLONOSCOPY;  Surgeon: Arta Silence, MD;  Location: [place] [facility];  Service: Endoscopy;  Laterality: Left;   ESOPHAGOGASTRODUODENOSCOPY (EGD) WITH PROPOFOL Left 03/09/2017   Procedure: ESOPHAGOGASTRODUODENOSCOPY (EGD) WITH PROPOFOL;  Surgeon: Ronnette Juniper, MD;  Location: [facility];  Service: Gastroenterology;  Laterality: Left;   EYE SURGERY Bilateral    cataract    FINGER SURGERY Right    5th   Prostate     DaVinci    Social History   Socioeconomic History   Marital status: Married    Spouse name: Not on file   Number of children: 4   Years of education: Not on file   Highest education level: Not on file  Occupational History   Not on file  Social Needs   Financial resource strain: Not on file   Food insecurity:    Worry: Not on file    Inability: Not on file   Transportation needs:    Medical: Not on file    Non-medical: Not on file  Tobacco Use   Smoking status: Former Smoker    Packs/day: 1.00    Years: 25.00    Pack years: 25.00    Last attempt to quit: 07/10/1988    Years since quitting: 30.4   Smokeless tobacco: Never Used  Substance and Sexual Activity  Alcohol use: Yes    Alcohol/week: 25.0 standard drinks    Types: 25 Shots of liquor per week   Drug use: No   Sexual activity: Not on file  Lifestyle   Physical activity:    Days per week: Not on file    Minutes per session: Not on file   Stress: Not on file  Relationships   Social connections:    Talks on phone: Not on file    Gets together: Not on file    Attends religious service: Not on file    Active member of club or organization: Not on file    Attends meetings of clubs or organizations: Not on file    Relationship status: Not on file   Intimate partner violence:    Fear of current or ex partner: Not on file    Emotionally abused: Not on file    Physically  abused: Not on file    Forced sexual activity: Not on file  Other Topics Concern   Not on file  Social History Narrative   Not on file    Current Outpatient Medications on File Prior to Visit  Medication Sig Dispense Refill   allopurinol (ZYLOPRIM) 100 MG tablet Take 100 mg by mouth daily.      apixaban (ELIQUIS) 5 MG TABS tablet Take 1 tablet (5 mg total) by mouth 2 (two) times daily. 180 tablet 1   atorvastatin (LIPITOR) 40 MG tablet Take 40 mg by mouth daily.      Cholecalciferol (VITAMIN D-3 PO) Take 2,000 Units by mouth daily.      colchicine 0.6 MG tablet Take 0.6 mg by mouth daily as needed (gout).      colesevelam (WELCHOL) 625 MG tablet Take 1,875 mg by mouth 2 (two) times daily with a meal.      L-Methylfolate-Algae-B12-B6 (METANX) 3-90.314-2-35 MG CAPS Take 1 tablet by mouth 2 (two) times daily.      Magnesium 400 MG TABS Take 400 mg by mouth daily.      metoprolol tartrate (LOPRESSOR) 50 MG tablet Take 1 tablet (50 mg total) by mouth 2 (two) times daily. Take two tablets twice daily for 2 days 60 tablet 2   mometasone-formoterol (DULERA) 100-5 MCG/ACT AERO Inhale 2 puffs into the lungs 2 (two) times daily. 3 Inhaler 3   montelukast (SINGULAIR) 10 MG tablet TAKE 1 TABLET BY MOUTH AT BEDTIME (Patient taking differently: Take 10 mg by mouth at bedtime. ) 30 tablet 11   omeprazole (PRILOSEC) 20 MG capsule Take 20 mg by mouth 2 (two) times daily before a meal.     Polyethyl Glycol-Propyl Glycol (SYSTANE OP) Place 1 drop into both eyes 3 (three) times daily as needed (dry eyes).     spironolactone (ALDACTONE) 25 MG tablet Take 1 tablet (25 mg total) by mouth daily for 30 days. 30 tablet 0   telmisartan (MICARDIS) 80 MG tablet Take 80 mg by mouth daily.     thiamine (VITAMIN B-1) 100 MG tablet Take 100 mg by mouth daily.     No current facility-administered medications on file prior to visit.    Review of Systems  Constitutional: Positive for malaise/fatigue  (improving). Negative for weight loss.  Respiratory: Positive for shortness of breath (improving). Negative for cough and hemoptysis.   Cardiovascular: Negative for chest pain, palpitations, claudication and leg swelling.  Gastrointestinal: Negative for abdominal pain, blood in stool, constipation, heartburn and vomiting.  Genitourinary: Negative for dysuria.  Musculoskeletal: Positive for joint pain.  Negative for myalgias.  Neurological: Negative for dizziness, focal weakness and headaches.  Endo/Heme/Allergies: Does not bruise/bleed easily.  Psychiatric/Behavioral: Negative for depression. The patient is not nervous/anxious.   All other systems reviewed and are negative.  Blood pressure (!) 126/93, pulse (!) 120, temperature (!) 97 F (36.1 C), height _0  (1.803 m), weight 246 lb (111.6 kg), SpO2 97 %. Body mass index is 34.31 kg/m.   Physical Exam  Constitutional: He appears well-developed. No distress.  HENT:  Head: Atraumatic.  Eyes: Conjunctivae are normal.  Neck: Neck supple. No JVD present. No thyromegaly present.  Cardiovascular: Regular rhythm and normal heart sounds. Tachycardia present. Exam reveals no gallop.  No murmur heard. Pulses:      Carotid pulses are 2+ on the right side and 2+ on the left side.      Dorsalis pedis pulses are 2+ on the right side and 2+ on the left side.       Posterior tibial pulses are 2+ on the right side and 2+ on the left side.  Femoral and popliteal pulse difficult to feel due to patient's body habitus.  1-2+ bilateral leg edema.  Pulmonary/Chest: Effort normal and breath sounds normal.  Abdominal: Soft. Bowel sounds are normal.  Musculoskeletal: Normal range of motion.  Neurological: He is alert.  Skin: Skin is warm and dry.  Psychiatric: He has a normal mood and affect.   Laboratory examination  BMP Latest Ref Rng & Units 12/03/2018 11/14/2018 10/31/2018  Glucose 65 - 99 mg/dL 132(H) 110(H) 118(H)  BUN 8 - 27 mg/dL 33(H) 33(H) 33(H)   Creatinine 0.76 - 1.27 mg/dL 1.24 1.35(H) 1.18  BUN/Creat Ratio 10 - 24 27(H) 24 28(H)  Sodium 134 - 144 mmol/L 144 137 143  Potassium 3.5 - 5.2 mmol/L 5.0 5.7(H) 5.6(H)  Chloride 96 - 106 mmol/L 106 103 102  CO2 20 - 29 mmol/L 18(L) 17(L) 24  Calcium 8.6 - 10.2 mg/dL 9.1 9.9 9.8   Labs 09/10/2018: HB 15.8/HCT 46.3, mild macrocytosis present with MCV of 100.  Platelet count 154.  Potassium 4.9, BUN 24, creatinine 1.1, eGFR greater than 60 mL.  ALT and AST elevated at 64 and 57 respectively.  Total cholesterol 184, triglycerides 199, HDL 85, LDL 59.  Labs 07/12/2017: HB 15.6, HCT 47.8, platelets 155.  Normal indicis.  Potassium 4.8, serum glucose 160 mg, BUN 23, creatinine 1.1.  CMP normal.  Total cholesterol 232, triglycerides 138, HDL 62, LDL 07/10/40.  Non-HDL cholesterol 170.  Uric acid is elevated at 7.8, vitamin D 25 x 1.  CARDIAC STUDIES:   Echocardiogram 09/23/2018 :  Poor echo window. Wall motion abnormality has reduced sensitivity. Left ventricle cavity is normal in size. Moderate concentric hypertrophy of the left ventricle. Abnormal septal wall motion due to left bundle branch block. Cannot exclude anterio septal hypokinesis.  Normal diastolic filling pattern. Visual EF is 30-35%. Left atrial cavity is moderate to severely dilated at 4.7 cm. Moderate (Grade II) mitral regurgitation. Trace tricuspid regurgitation. Unable to estimate PA pressure due to absence/minimal TR signal.  Lexiscan myoview stress test 09/20/2018:  1. Lexiscan stress test was performed. Exercise capacity was not assessed. Stress symptoms included dizziness. Resting blood pressure was 164/108 mmHg and peak effect blood pressure was 174/116 mmHg. The resting and stress electrocardiogram demonstrated atrial fibrillation with rapid ventricular rate, occasional PVC, and normal rest repolarization.  Stress EKG is non diagnostic for ischemia as it is a pharmacologic stress. In addition, no ischemic changes seen at heart  rate of 136 bpm.  2. The overall quality of the study is good.  Left ventricular cavity is noted to be enlarged on the rest and stress studies.  Gated SPECT images reveal moderate global decrease in myocardial thickening and wall motion.  The left ventricular ejection fraction was calculated or visually estimated to be 34%.  REST and STRESS images demonstrate mildly decreased tracer uptake in the basal inferior, mid inferior and apical inferior segments of the left ventricle, with no reversibility. Findings suggestive of dilated nonishcemic cardiomyopathy.  3. High risk study due to reduced LVED. Recommend clinical correlation.   Assessment & Recommendations:   Paroxysmal atrial fibrillation (HCC) - Plan: EKG 12-Lead, sotalol (BETAPACE) 80 MG tablet  Chronic combined systolic and diastolic heart failure (HCC)  Non-ischemic cardiomyopathy (HCC)  Essential hypertensionCHA2DS2-VASc Score is 3.  -(CHF; HTN; vasc disease DM,  Male = 1; Age <65 =0; 65-74 = 1,  >75 =2; stroke = 2).  -(Yearly risk of stroke: Score of 1=1.3; 2=2.2; 3=3.2; 4=4; 5=6.7; 6=9.8; 7=>9.8)  EKG 12/17/2018: Atrial flutter with 2:1 conduction, appears to be atypical a flutter.  Rapid ventricular rate of 122 bpm.  Left axis deviation, left anterior fascicular block.  Incomplete right bundle branch block.  Nonspecific T abnormality. PVC  EKG 10/17/2018: Normal sinus rhythm at the rate of 74 bpm, left axis deviation, left anterior fascicular block.  IVCD, LVH.  Single pair of Ventricular couplets.  Normal QT interval.  Nonspecific T abnormality.  EKG 09/11/2018: Atrial fibrillation with rapid ventricular response at the rate of 136 bpm, left axis deviation, left anterior fascicular block.  Incomplete right bundle branch block.  Nonspecific T abnormality.  Recommendation:   Echocardiogram which revealed severe LV systolic dysfunction probably related to A. fib with RVR.  Nuclear stress test does not reveal ischemia.  I suspect  nonischemic cardiomyopathy and were performed in March 2020.  He is presently doing well, there is no clinical evidence of acute decompensated heart failure, leg edema is remained stable and very minimal, Potassium levels have stabilized after decreasing the dose of spironolactone.  Blood pressure is also relatively well controlled.  No acute decompensated heart failure.  I would like to start him on sotalol 80 mg p.o. b.i.d., patient will bring the medication to our office for therapeutic drug monitoring and weight in the waiting room for at least 3 hours after he takes the medication.  I'll like to see him back in 2 weeks with repeat EKG.  Weight loss and the importance of weight loss and maintaining sinus rhythm was discussed. I discussed with him regarding stage III chronic kidney disease. I'll start him on 80 mg b.i.d. of sotalol, would try to avoid higher dose in view of kidney disease. 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  I may have to reduce the dose of metoprolol back to minimal dose.  Blood pressure has mostly been well controlled at home.  Hence I did not make any changes to his medications as has noticed his systolic blood pressure to be in high 90s sometimes.  [name], MD, [facility] 12/17/2018, 11:12 AM [facility] [place] Pager: [phone] Office: [phone] If no answer Cell [phone]

## 2018-12-18 ENCOUNTER — Encounter: Payer: Self-pay | Admitting: Cardiology

## 2018-12-18 ENCOUNTER — Ambulatory Visit (INDEPENDENT_AMBULATORY_CARE_PROVIDER_SITE_OTHER): Payer: Medicare Other | Admitting: Cardiology

## 2018-12-18 VITALS — BP 124/86 | HR 121 | Ht 71.0 in | Wt 246.0 lb

## 2018-12-18 DIAGNOSIS — I480 Paroxysmal atrial fibrillation: Secondary | ICD-10-CM | POA: Diagnosis not present

## 2018-12-18 NOTE — Progress Notes (Signed)
Subjective:  Primary Physician:  [name], MD  Patient ID: Darren [name], male    DOB: February 25, 1941, 78 y.o.   MRN: [id number]  Chief Complaint  Patient presents with  . Atrial Fibrillation  . Hypertension  . Follow-up    HPI: Darren [name]  is a 78 y.o. male  with moderate obesity paroxysmal atrial fibrillation and probably sinus node dysfunction with  sinus bradycardia even on minimal dose of beta-blocker, OSA on CPAP, hyperlipidemia, bronchial asthma and history of excessive alcohol intake presents for therapeutic drug monitoring for initiation of sotalol.  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  He is in office today for follow-up of atrial fibrillation He was advised to take sotalol and waited in the waiting area for 4 hours.  Atrial fibrillation is associated with worsening dyspnea and fatigue symptoms.  Denies leg edema or chest pain, leg edema has resolved.   Past Medical History:  Diagnosis Date  . Arthritis    "maybe a little"  . Asthma    "mild"  . Atrial fibrillation ([place])   . bilateral leg edema 09/11/2018  . Cancer [facility])    prostate cancer  . Dyspnea   . Dyspnea on exertion 09/11/2018  . Dysrhythmia    Afib with rapid ventricular  . Essential hypertension 07/25/2018  . Fatty liver   . GERD (gastroesophageal reflux disease)   . GI bleed 2018  . Gout   . History of blood transfusion 2018  . Obstructive sleep apnea 09/11/2018  . OSA (obstructive sleep apnea)   . Paroxysmal atrial fibrillation ([place]) 09/11/2018  . Pre-diabetes     Past Surgical History:  Procedure Laterality Date  . CARDIOVERSION N/A 10/08/2018   Procedure:  CARDIOVERSION;  Surgeon: [name], MD;  Location: [facility];  Service: Cardiovascular;  Laterality: N/A;  . COLONOSCOPY Left 03/10/2017   Procedure: COLONOSCOPY;  Surgeon: Arta Silence, MD;  Location: [facility] ENDOSCOPY;  Service: Endoscopy;  Laterality: Left;  . ESOPHAGOGASTRODUODENOSCOPY (EGD) WITH PROPOFOL Left 03/09/2017   Procedure: ESOPHAGOGASTRODUODENOSCOPY (EGD) WITH PROPOFOL;  Surgeon: Ronnette Juniper, MD;  Location: [facility];  Service: Gastroenterology;  Laterality: Left;  . EYE SURGERY Bilateral    cataract   . FINGER SURGERY Right    5th  . Prostate     DaVinci    Social History   Socioeconomic History  . Marital status: Married    Spouse name: Not on file  . Number of children: 4  . Years of education: Not on file  . Highest education level: Not on file  Occupational History  . Not on file  Social Needs  . Financial resource strain: Not on file  . Food insecurity:    Worry: Not on file    Inability: Not on file  . Transportation needs:    Medical: Not on file    Non-medical: Not on file  Tobacco Use  . Smoking status: Former Smoker    Packs/day: 1.00    Years: 25.00    Pack years: 25.00    Last attempt to quit: 07/10/1988    Years since quitting: 30.4  . Smokeless tobacco: Never Used  Substance and Sexual Activity  .  Alcohol use: Yes    Alcohol/week: 25.0 standard drinks    Types: 25 Shots of liquor per week  . Drug use: No  . Sexual activity: Not on file  Lifestyle  . Physical activity:    Days per week: Not on file    Minutes per session: Not on file  . Stress: Not on file  Relationships  . Social connections:    Talks on phone: Not on file    Gets together: Not on file    Attends religious service: Not on file    Active member of club or organization: Not on file    Attends meetings of clubs or organizations: Not on file    Relationship status: Not on file  . Intimate partner violence:    Fear of current or ex partner: Not on file     Emotionally abused: Not on file    Physically abused: Not on file    Forced sexual activity: Not on file  Other Topics Concern  . Not on file  Social History Narrative  . Not on file    Current Outpatient Medications on File Prior to Visit  Medication Sig Dispense Refill  . allopurinol (ZYLOPRIM) 100 MG tablet Take 100 mg by mouth daily.     [name] apixaban (ELIQUIS) 5 MG TABS tablet Take 1 tablet (5 mg total) by mouth 2 (two) times daily. 180 tablet 1  . atorvastatin (LIPITOR) 40 MG tablet Take 40 mg by mouth daily.     . Cholecalciferol (VITAMIN D-3 PO) Take 2,000 Units by mouth daily.     . colchicine 0.6 MG tablet Take 0.6 mg by mouth daily as needed (gout).     . colesevelam (WELCHOL) 625 MG tablet Take 1,875 mg by mouth 2 (two) times daily with a meal.     . L-Methylfolate-Algae-B12-B6 (METANX) 3-90.314-2-35 MG CAPS Take 1 tablet by mouth 2 (two) times daily.     . Magnesium 400 MG TABS Take 400 mg by mouth daily.     . metoprolol tartrate (LOPRESSOR) 50 MG tablet Take 1 tablet (50 mg total) by mouth 2 (two) times daily. Take two tablets twice daily for 2 days 60 tablet 2  . mometasone-formoterol (DULERA) 100-5 MCG/ACT AERO Inhale 2 puffs into the lungs 2 (two) times daily. 3 Inhaler 3  . montelukast (SINGULAIR) 10 MG tablet TAKE 1 TABLET BY MOUTH AT BEDTIME (Patient taking differently: Take 10 mg by mouth at bedtime. ) 30 tablet 11  . omeprazole (PRILOSEC) 20 MG capsule Take 20 mg by mouth 2 (two) times daily before a meal.    . Polyethyl Glycol-Propyl Glycol (SYSTANE OP) Place 1 drop into both eyes 3 (three) times daily as needed (dry eyes).    . sotalol (BETAPACE) 80 MG tablet Take 1 tablet (80 mg total) by mouth 2 (two) times daily. 60 tablet 1  . spironolactone (ALDACTONE) 25 MG tablet Take 1 tablet (25 mg total) by mouth daily for 30 days. 30 tablet 0  . telmisartan (MICARDIS) 80 MG tablet Take 80 mg by mouth daily.    [name] thiamine (VITAMIN B-1) 100 MG tablet Take 100 mg by mouth  daily.     No current facility-administered medications on file prior to visit.    Review of Systems  Constitutional: Positive for malaise/fatigue (improving). Negative for weight loss.  Respiratory: Positive for shortness of breath (improving). Negative for cough and hemoptysis.   Cardiovascular: Negative for chest pain, palpitations, claudication and leg swelling.  Gastrointestinal:  Negative for abdominal pain, blood in stool, constipation, heartburn and vomiting.  Genitourinary: Negative for dysuria.  Musculoskeletal: Positive for joint pain. Negative for myalgias.  Neurological: Negative for dizziness, focal weakness and headaches.  Endo/Heme/Allergies: Does not bruise/bleed easily.  Psychiatric/Behavioral: Negative for depression. The patient is not nervous/anxious.   All other systems reviewed and are negative.  Blood pressure (!) 126/93, pulse (!) 120, temperature (!) 97 F (36.1 C), height 5' 11"  (1.803 m), weight 246 lb (111.6 kg), SpO2 97 %. There is no height or weight on file to calculate BMI.  Physical Exam  Constitutional: He appears well-developed. No distress.  HENT:  Head: Atraumatic.  Eyes: Conjunctivae are normal.  Neck: Neck supple. No JVD present. No thyromegaly present.  Cardiovascular: Regular rhythm and normal heart sounds. Tachycardia present. Exam reveals no gallop.  No murmur heard. Pulses:      Carotid pulses are 2+ on the right side and 2+ on the left side.      Dorsalis pedis pulses are 2+ on the right side and 2+ on the left side.       Posterior tibial pulses are 2+ on the right side and 2+ on the left side.  Femoral and popliteal pulse difficult to feel due to patient's body habitus.  1-2+ bilateral leg edema.  Pulmonary/Chest: Effort normal and breath sounds normal.  Abdominal: Soft. Bowel sounds are normal.  Musculoskeletal: Normal range of motion.  Neurological: He is alert.  Skin: Skin is warm and dry.  Psychiatric: He has a normal mood and  affect.   Laboratory examination  BMP Latest Ref Rng & Units 12/03/2018 11/14/2018 10/31/2018  Glucose 65 - 99 mg/dL 132(H) 110(H) 118(H)  BUN 8 - 27 mg/dL 33(H) 33(H) 33(H)  Creatinine 0.76 - 1.27 mg/dL 1.24 1.35(H) 1.18  BUN/Creat Ratio 10 - 24 27(H) 24 28(H)  Sodium 134 - 144 mmol/L 144 137 143  Potassium 3.5 - 5.2 mmol/L 5.0 5.7(H) 5.6(H)  Chloride 96 - 106 mmol/L 106 103 102  CO2 20 - 29 mmol/L 18(L) 17(L) 24  Calcium 8.6 - 10.2 mg/dL 9.1 9.9 9.8   Labs 09/10/2018: HB 15.8/HCT 46.3, mild macrocytosis present with MCV of 100.  Platelet count 154.  Potassium 4.9, BUN 24, creatinine 1.1, eGFR greater than 60 mL.  ALT and AST elevated at 64 and 57 respectively.  Total cholesterol 184, triglycerides 199, HDL 85, LDL 59.  Labs 07/12/2017: HB 15.6, HCT 47.8, platelets 155.  Normal indicis.  Potassium 4.8, serum glucose 160 mg, BUN 23, creatinine 1.1.  CMP normal.  Total cholesterol 232, triglycerides 138, HDL 62, LDL 07/10/40.  Non-HDL cholesterol 170.  Uric acid is elevated at 7.8, vitamin D 25 x 1.  CARDIAC STUDIES:   Echocardiogram 09/23/2018 :  Poor echo window. Wall motion abnormality has reduced sensitivity. Left ventricle cavity is normal in size. Moderate concentric hypertrophy of the left ventricle. Abnormal septal wall motion due to left bundle branch block. Cannot exclude anterio septal hypokinesis.  Normal diastolic filling pattern. Visual EF is 30-35%. Left atrial cavity is moderate to severely dilated at 4.7 cm. Moderate (Grade II) mitral regurgitation. Trace tricuspid regurgitation. Unable to estimate PA pressure due to absence/minimal TR signal.  Lexiscan myoview stress test 09/20/2018:  1. Lexiscan stress test was performed. Exercise capacity was not assessed. Stress symptoms included dizziness. Resting blood pressure was 164/108 mmHg and peak effect blood pressure was 174/116 mmHg. The resting and stress electrocardiogram demonstrated atrial fibrillation with rapid ventricular  rate, occasional PVC,  and normal rest repolarization.  Stress EKG is non diagnostic for ischemia as it is a pharmacologic stress. In addition, no ischemic changes seen at heart rate of 136 bpm.  2. The overall quality of the study is good.  Left ventricular cavity is noted to be enlarged on the rest and stress studies.  Gated SPECT images reveal moderate global decrease in myocardial thickening and wall motion.  The left ventricular ejection fraction was calculated or visually estimated to be 34%.  REST and STRESS images demonstrate mildly decreased tracer uptake in the basal inferior, mid inferior and apical inferior segments of the left ventricle, with no reversibility. Findings suggestive of dilated nonishcemic cardiomyopathy.  3. High risk study due to reduced LVED. Recommend clinical correlation.   Assessment & Recommendations:   Paroxysmal atrial fibrillation ([place]) - Plan: EKG 12-LeadCHA2DS2-VASc Score is 3.  -(CHF; HTN; vasc disease DM,  Male = 1; Age <65 =0; 65-74 = 1,  >75 =2; stroke = 2).  -(Yearly risk of stroke: Score of 1=1.3; 2=2.2; 3=3.2; 4=4; 5=6.7; 6=9.8; 7=>9.8)  EKG 12/18/2018: Atrial flutter with 2:1 conduction, atypical atrial flutter.  Left axis deviation, left anterior fascicular block.  Incomplete right bundle branch block.  Nonspecific T abnormality. No significant change from  EKG 12/17/2018   EKG 10/17/2018: Normal sinus rhythm at the rate of 74 bpm, left axis deviation, left anterior fascicular block.  IVCD, LVH.  Single pair of Ventricular couplets.  Normal QT interval.  Nonspecific T abnormality.  EKG 09/11/2018: Atrial fibrillation with rapid ventricular response at the rate of 136 bpm, left axis deviation, left anterior fascicular block.  Incomplete right bundle branch block.  Nonspecific T abnormality.  Recommendation:   EKG reviewed, he persist to be in atrial flutter with rapid ventricular response.  He will continue sotalol, I'll  see him back in 2 weeks with  repeat EKG. Although blood pressure elevated, we will monitor this for now to see how he would respond if he were to convert to sinus rhythm.  In 2 weeks if he still in atrial fibrillation aren't, we will consider direct current cardioversion. Repeat blood pressure after 4 hours observation and EKG stable.  No QT prolonged duration.  [name], MD, [facility] 12/18/2018, 8:39 PM [facility] [place] Pager: [phone] Office: [phone] If no answer Cell [phone]

## 2019-01-01 ENCOUNTER — Other Ambulatory Visit: Payer: Self-pay

## 2019-01-01 ENCOUNTER — Ambulatory Visit (INDEPENDENT_AMBULATORY_CARE_PROVIDER_SITE_OTHER): Payer: Medicare Other | Admitting: Cardiology

## 2019-01-01 VITALS — BP 134/100 | HR 107 | Ht 71.0 in | Wt 245.5 lb

## 2019-01-01 DIAGNOSIS — I480 Paroxysmal atrial fibrillation: Secondary | ICD-10-CM

## 2019-01-01 DIAGNOSIS — G4733 Obstructive sleep apnea (adult) (pediatric): Secondary | ICD-10-CM | POA: Diagnosis not present

## 2019-01-01 DIAGNOSIS — I5042 Chronic combined systolic (congestive) and diastolic (congestive) heart failure: Secondary | ICD-10-CM | POA: Diagnosis not present

## 2019-01-01 DIAGNOSIS — I10 Essential (primary) hypertension: Secondary | ICD-10-CM

## 2019-01-01 DIAGNOSIS — F1010 Alcohol abuse, uncomplicated: Secondary | ICD-10-CM

## 2019-01-01 MED ORDER — SPIRONOLACTONE 25 MG PO TABS: 25.0000 mg | ORAL_TABLET | Freq: Every day | ORAL | 1 refills | Status: DC

## 2019-01-01 MED ORDER — SOTALOL HCL 80 MG PO TABS: 40.0000 mg | ORAL_TABLET | Freq: Two times a day (BID) | ORAL | 1 refills | Status: DC

## 2019-01-01 MED ORDER — HYDRALAZINE HCL 25 MG PO TABS: 25.0000 mg | ORAL_TABLET | Freq: Three times a day (TID) | ORAL | 3 refills | Status: DC

## 2019-01-01 NOTE — H&P (View-Only) (Signed)
Subjective:  Primary Physician:  [name], MD  Patient ID: Darren [name], male    DOB: 10/13/1940, 78 y.o.   MRN: [id number]  Chief Complaint  Patient presents with  . Atrial Fibrillation  . Follow-up    2wk    HPI: Darren [name]  is a 78 y.o. male  with moderate obesity paroxysmal atrial fibrillation and probably sinus node dysfunction with  sinus bradycardia even on minimal dose of beta-blocker, OSA on CPAP, hyperlipidemia, bronchial asthma and history of excessive alcohol intake presents for F/U OF ATRIAL FIBRILLATION. [name]  He was started on sotalol on 12/18/2018 and now presents for follow-up at 2 weeks.  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 His last office visit in 10 days ago, I started him on sotalol he now presents for follow-up.  He still feels fatigued, notices heart rate to be up.  He still drinking 4-6 glasses of wine a day.  Past Medical History:  Diagnosis Date  . Arthritis    "maybe a little"  . Asthma    "mild"  . Atrial fibrillation ([place])   . bilateral leg edema 09/11/2018  . Cancer [facility])    prostate cancer  . Dyspnea   . Dyspnea on exertion 09/11/2018  . Dysrhythmia    Afib with rapid ventricular  . Essential hypertension 07/25/2018  . Fatty liver   . GERD (gastroesophageal reflux disease)   . GI bleed 2018  . Gout   . History of blood transfusion 2018  . Obstructive sleep apnea 09/11/2018  . OSA (obstructive sleep apnea)   . Paroxysmal atrial fibrillation ([place]) 09/11/2018  . Pre-diabetes     Past Surgical History:  Procedure Laterality Date  . CARDIOVERSION N/A 10/08/2018   Procedure: CARDIOVERSION;  Surgeon: [name], MD;  Location: [place];   Service: Cardiovascular;  Laterality: N/A;  . COLONOSCOPY Left 03/10/2017   Procedure: COLONOSCOPY;  Surgeon: Arta Silence, MD;  Location: [facility] ENDOSCOPY;  Service: Endoscopy;  Laterality: Left;  . ESOPHAGOGASTRODUODENOSCOPY (EGD) WITH PROPOFOL Left 03/09/2017   Procedure: ESOPHAGOGASTRODUODENOSCOPY (EGD) WITH PROPOFOL;  Surgeon: Ronnette Juniper, MD;  Location: [place];  Service: Gastroenterology;  Laterality: Left;  . EYE SURGERY Bilateral    cataract   . FINGER SURGERY Right    5th  . Prostate     DaVinci    Social History   Socioeconomic History  . Marital status: Married    Spouse name: Not on file  . Number of children: 4  . Years of education: Not on file  . Highest education level: Not on file  Occupational History  . Not on file  Social Needs  . Financial resource strain: Not on file  . Food insecurity    Worry: Not on file    Inability: Not on file  . Transportation needs    Medical: Not on file    Non-medical: Not on file  Tobacco Use  . Smoking status: Former Smoker    Packs/day: 1.00    Years: 25.00    Pack years: 25.00    Quit date: 07/10/1988    Years since quitting: 30.5  . Smokeless tobacco: Never Used  Substance and Sexual Activity  . Alcohol use:  Yes    Alcohol/week: 25.0 standard drinks    Types: 25 Shots of liquor per week  . Drug use: No  . Sexual activity: Not on file  Lifestyle  . Physical activity    Days per week: Not on file    Minutes per session: Not on file  . Stress: Not on file  Relationships  . Social Herbalist on phone: Not on file    Gets together: Not on file    Attends religious service: Not on file    Active member of club or organization: Not on file    Attends meetings of clubs or organizations: Not on file    Relationship status: Not on file  . Intimate partner violence    Fear of current or ex partner: Not on file    Emotionally abused: Not on file    Physically abused: Not on file    Forced sexual  activity: Not on file  Other Topics Concern  . Not on file  Social History Narrative  . Not on file    Current Outpatient Medications on File Prior to Visit  Medication Sig Dispense Refill  . allopurinol (ZYLOPRIM) 100 MG tablet Take 100 mg by mouth daily.     [name] apixaban (ELIQUIS) 5 MG TABS tablet Take 1 tablet (5 mg total) by mouth 2 (two) times daily. 180 tablet 1  . atorvastatin (LIPITOR) 40 MG tablet Take 40 mg by mouth daily.     . Cholecalciferol (VITAMIN D-3 PO) Take 2,000 Units by mouth daily.     . colchicine 0.6 MG tablet Take 0.6 mg by mouth daily as needed (gout).     . colesevelam (WELCHOL) 625 MG tablet Take 1,875 mg by mouth 2 (two) times daily with a meal.     . L-Methylfolate-Algae-B12-B6 (METANX) 3-90.314-2-35 MG CAPS Take 1 tablet by mouth 2 (two) times daily.     . Magnesium 400 MG TABS Take 400 mg by mouth daily.     . metoprolol tartrate (LOPRESSOR) 50 MG tablet Take 1 tablet (50 mg total) by mouth 2 (two) times daily. Take two tablets twice daily for 2 days 60 tablet 2  . mometasone-formoterol (DULERA) 100-5 MCG/ACT AERO Inhale 2 puffs into the lungs 2 (two) times daily. 3 Inhaler 3  . montelukast (SINGULAIR) 10 MG tablet TAKE 1 TABLET BY MOUTH AT BEDTIME (Patient taking differently: Take 10 mg by mouth at bedtime. ) 30 tablet 11  . omeprazole (PRILOSEC) 20 MG capsule Take 20 mg by mouth 2 (two) times daily before a meal.    . Polyethyl Glycol-Propyl Glycol (SYSTANE OP) Place 1 drop into both eyes 3 (three) times daily as needed (dry eyes).    [name] telmisartan (MICARDIS) 80 MG tablet Take 80 mg by mouth daily.    [name] thiamine (VITAMIN B-1) 100 MG tablet Take 100 mg by mouth daily.     No current facility-administered medications on file prior to visit.    Review of Systems  Constitutional: Positive for malaise/fatigue (improving). Negative for weight loss.  Respiratory: Positive for shortness of breath (improving). Negative for cough and hemoptysis.   Cardiovascular:  Negative for chest pain, palpitations, claudication and leg swelling.  Gastrointestinal: Negative for abdominal pain, blood in stool, constipation, heartburn and vomiting.  Genitourinary: Negative for dysuria.  Musculoskeletal: Positive for joint pain. Negative for myalgias.  Neurological: Negative for dizziness, focal weakness and headaches.  Endo/Heme/Allergies: Does not bruise/bleed easily.  Psychiatric/Behavioral: Negative for depression. The  patient is not nervous/anxious.   All other systems reviewed and are negative.  Blood pressure (!) 126/93, pulse (!) 120, temperature (!) 97 F (36.1 C), height 5' 11"  (1.803 m), weight 246 lb (111.6 kg), SpO2 97 %. Body mass index is 34.24 kg/m.  Physical Exam  Constitutional: He appears well-developed. No distress.  HENT:  Head: Atraumatic.  Eyes: Conjunctivae are normal.  Neck: Neck supple. No JVD present. No thyromegaly present.  Cardiovascular: Regular rhythm and normal heart sounds. Tachycardia present. Exam reveals no gallop.  No murmur heard. Pulses:      Carotid pulses are 2+ on the right side and 2+ on the left side.      Dorsalis pedis pulses are 2+ on the right side and 2+ on the left side.       Posterior tibial pulses are 2+ on the right side and 2+ on the left side.  Femoral and popliteal pulse difficult to feel due to patient's body habitus.  1-2+ bilateral leg edema.  Pulmonary/Chest: Effort normal and breath sounds normal.  Abdominal: Soft. Bowel sounds are normal.  Musculoskeletal: Normal range of motion.  Neurological: He is alert.  Skin: Skin is warm and dry.  Psychiatric: He has a normal mood and affect.   Laboratory examination  BMP Latest Ref Rng & Units 12/03/2018 11/14/2018 10/31/2018  Glucose 65 - 99 mg/dL 132(H) 110(H) 118(H)  BUN 8 - 27 mg/dL 33(H) 33(H) 33(H)  Creatinine 0.76 - 1.27 mg/dL 1.24 1.35(H) 1.18  BUN/Creat Ratio 10 - 24 27(H) 24 28(H)  Sodium 134 - 144 mmol/L 144 137 143  Potassium 3.5 - 5.2  mmol/L 5.0 5.7(H) 5.6(H)  Chloride 96 - 106 mmol/L 106 103 102  CO2 20 - 29 mmol/L 18(L) 17(L) 24  Calcium 8.6 - 10.2 mg/dL 9.1 9.9 9.8   Labs 09/10/2018: HB 15.8/HCT 46.3, mild macrocytosis present with MCV of 100.  Platelet count 154.  Potassium 4.9, BUN 24, creatinine 1.1, eGFR greater than 60 mL.  ALT and AST elevated at 64 and 57 respectively.  Total cholesterol 184, triglycerides 199, HDL 85, LDL 59.  Labs 07/12/2017: HB 15.6, HCT 47.8, platelets 155.  Normal indicis.  Potassium 4.8, serum glucose 160 mg, BUN 23, creatinine 1.1.  CMP normal.  Total cholesterol 232, triglycerides 138, HDL 62, LDL 07/10/40.  Non-HDL cholesterol 170.  Uric acid is elevated at 7.8, vitamin D 25 x 1.  CARDIAC STUDIES:   Echocardiogram 09/23/2018 :  Poor echo window. Wall motion abnormality has reduced sensitivity. Left ventricle cavity is normal in size. Moderate concentric hypertrophy of the left ventricle. Abnormal septal wall motion due to left bundle branch block. Cannot exclude anterio septal hypokinesis.  Normal diastolic filling pattern. Visual EF is 30-35%. Left atrial cavity is moderate to severely dilated at 4.7 cm. Moderate (Grade II) mitral regurgitation. Trace tricuspid regurgitation. Unable to estimate PA pressure due to absence/minimal TR signal.  Lexiscan myoview stress test 09/20/2018:  1. Lexiscan stress test was performed. Exercise capacity was not assessed. Stress symptoms included dizziness. Resting blood pressure was 164/108 mmHg and peak effect blood pressure was 174/116 mmHg. The resting and stress electrocardiogram demonstrated atrial fibrillation with rapid ventricular rate, occasional PVC, and normal rest repolarization.  Stress EKG is non diagnostic for ischemia as it is a pharmacologic stress. In addition, no ischemic changes seen at heart rate of 136 bpm.  2. The overall quality of the study is good.  Left ventricular cavity is noted to be enlarged on the rest  and stress studies.   Gated SPECT images reveal moderate global decrease in myocardial thickening and wall motion.  The left ventricular ejection fraction was calculated or visually estimated to be 34%.  REST and STRESS images demonstrate mildly decreased tracer uptake in the basal inferior, mid inferior and apical inferior segments of the left ventricle, with no reversibility. Findings suggestive of dilated nonishcemic cardiomyopathy.  3. High risk study due to reduced LVED. Recommend clinical correlation.   Assessment & Recommendations:   Paroxysmal atrial fibrillation (HCC) - Plan: EKG 12-Lead, sotalol (BETAPACE) 80 MG tablet 1/2 tablet BID  Essential hypertension - Plan: spironolactone (ALDACTONE) 25 MG tablet  Obstructive sleep apnea - Plan: Ambulatory referral to Pulmonology  Chronic combined systolic and diastolic heart failure ([place]) - Plan: hydrALAZINE (APRESOLINE) 25 MG tablet  Alcohol abuse   EKG 01/01/2019: Atrial flutter, probably typical with 2: 1 AV conduction at the rate of 140 bpm.  Left axis deviation.  Left anterior fascicular block.  IVCD, LVH.  Prolonged QT interval at 548 ms.  EKG 12/18/2018: Atrial flutter with 2:1 conduction, atypical atrial flutter.  Left axis deviation, left anterior fascicular block.  Incomplete right bundle branch block.  Nonspecific T abnormality. No significant change from  EKG 12/17/2018   EKG 10/17/2018: Normal sinus rhythm at the rate of 74 bpm, left axis deviation, left anterior fascicular block.  IVCD, LVH.  Single pair of Ventricular couplets.  Normal QT interval.  Nonspecific T abnormality.  EKG 09/11/2018: Atrial fibrillation with rapid ventricular response at the rate of 136 bpm, left axis deviation, left anterior fascicular block.  Incomplete right bundle branch block.  Nonspecific T abnormality.  Recommendation:   EKG reviewed, he persist to be in atrial flutter with rapid ventricular response.  He will continue sotalol, reduce the sotalol dose to 40 mg BID  and set up for direct current cardioversion. Discussed stopping alcohol abuse.   If he fails to maintain sinus, will refer for EP consult. Could also consider Tikosyn, but concerned about use of diuretics and alcohol.  He is scheduled for repeat echo next month for evaluation of LV systolic dysfunction. I will add Hydralazine for hypertension and also chronic systolic and diastolic CHF as well. His BP is controllled at home per patient.   He needs referral for sleep evaluation as he needs new equipment. He is compliant with CPAP use.   [name], MD, [facility] 01/02/2019, 9:49 PM [facility]. [place] Pager: [phone] Office: [phone] If no answer Cell [phone]

## 2019-01-01 NOTE — Progress Notes (Signed)
Subjective:  Primary Physician:  [name], MD  Patient ID: Darren [name], male    DOB: [birth year], 78 y.o.   MRN: [id number]  Chief Complaint  Patient presents with  . Atrial Fibrillation  . Follow-up    2wk    HPI: Darren [name]  is a 78 y.o. male  with moderate obesity paroxysmal atrial fibrillation and probably sinus node dysfunction with  sinus bradycardia even on minimal dose of beta-blocker, OSA on CPAP, hyperlipidemia, bronchial asthma and history of excessive alcohol intake presents for F/U OF ATRIAL FIBRILLATION. [name]  He was started on sotalol on 12/18/2018 and now presents for follow-up at 2 weeks.  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 His last office visit in 10 days ago, I started him on sotalol he now presents for follow-up.  He still feels fatigued, notices heart rate to be up.  He still drinking 4-6 glasses of wine a day.  Past Medical History:  Diagnosis Date  . Arthritis    "maybe a little"  . Asthma    "mild"  . Atrial fibrillation ([place])   . bilateral leg edema 09/11/2018  . Cancer [facility])    prostate cancer  . Dyspnea   . Dyspnea on exertion 09/11/2018  . Dysrhythmia    Afib with rapid ventricular  . Essential hypertension 07/25/2018  . Fatty liver   . GERD (gastroesophageal reflux disease)   . GI bleed 2018  . Gout   . History of blood transfusion 2018  . Obstructive sleep apnea 09/11/2018  . OSA (obstructive sleep apnea)   . Paroxysmal atrial fibrillation ([place]) 09/11/2018  . Pre-diabetes     Past Surgical History:  Procedure Laterality Date  . CARDIOVERSION N/A 10/08/2018   Procedure: CARDIOVERSION;  Surgeon: [name], MD;  Location: [place];   Service: Cardiovascular;  Laterality: N/A;  . COLONOSCOPY Left 03/10/2017   Procedure: COLONOSCOPY;  Surgeon: Arta Silence, MD;  Location: [facility] ENDOSCOPY;  Service: Endoscopy;  Laterality: Left;  . ESOPHAGOGASTRODUODENOSCOPY (EGD) WITH PROPOFOL Left 03/09/2017   Procedure: ESOPHAGOGASTRODUODENOSCOPY (EGD) WITH PROPOFOL;  Surgeon: Ronnette Juniper, MD;  Location: [place];  Service: Gastroenterology;  Laterality: Left;  . EYE SURGERY Bilateral    cataract   . FINGER SURGERY Right    5th  . Prostate     DaVinci    Social History   Socioeconomic History  . Marital status: Married    Spouse name: Not on file  . Number of children: 4  . Years of education: Not on file  . Highest education level: Not on file  Occupational History  . Not on file  Social Needs  . Financial resource strain: Not on file  . Food insecurity    Worry: Not on file    Inability: Not on file  . Transportation needs    Medical: Not on file    Non-medical: Not on file  Tobacco Use  . Smoking status: Former Smoker    Packs/day: 1.00    Years: 25.00    Pack years: 25.00    Quit date: 07/10/1988    Years since quitting: 30.5  . Smokeless tobacco: Never Used  Substance and Sexual Activity  . Alcohol use:  Yes    Alcohol/week: 25.0 standard drinks    Types: 25 Shots of liquor per week  . Drug use: No  . Sexual activity: Not on file  Lifestyle  . Physical activity    Days per week: Not on file    Minutes per session: Not on file  . Stress: Not on file  Relationships  . Social Herbalist on phone: Not on file    Gets together: Not on file    Attends religious service: Not on file    Active member of club or organization: Not on file    Attends meetings of clubs or organizations: Not on file    Relationship status: Not on file  . Intimate partner violence    Fear of current or ex partner: Not on file    Emotionally abused: Not on file    Physically abused: Not on file    Forced sexual  activity: Not on file  Other Topics Concern  . Not on file  Social History Narrative  . Not on file    Current Outpatient Medications on File Prior to Visit  Medication Sig Dispense Refill  . allopurinol (ZYLOPRIM) 100 MG tablet Take 100 mg by mouth daily.     [name] apixaban (ELIQUIS) 5 MG TABS tablet Take 1 tablet (5 mg total) by mouth 2 (two) times daily. 180 tablet 1  . atorvastatin (LIPITOR) 40 MG tablet Take 40 mg by mouth daily.     . Cholecalciferol (VITAMIN D-3 PO) Take 2,000 Units by mouth daily.     . colchicine 0.6 MG tablet Take 0.6 mg by mouth daily as needed (gout).     . colesevelam (WELCHOL) 625 MG tablet Take 1,875 mg by mouth 2 (two) times daily with a meal.     . L-Methylfolate-Algae-B12-B6 (METANX) 3-90.314-2-35 MG CAPS Take 1 tablet by mouth 2 (two) times daily.     . Magnesium 400 MG TABS Take 400 mg by mouth daily.     . metoprolol tartrate (LOPRESSOR) 50 MG tablet Take 1 tablet (50 mg total) by mouth 2 (two) times daily. Take two tablets twice daily for 2 days 60 tablet 2  . mometasone-formoterol (DULERA) 100-5 MCG/ACT AERO Inhale 2 puffs into the lungs 2 (two) times daily. 3 Inhaler 3  . montelukast (SINGULAIR) 10 MG tablet TAKE 1 TABLET BY MOUTH AT BEDTIME (Patient taking differently: Take 10 mg by mouth at bedtime. ) 30 tablet 11  . omeprazole (PRILOSEC) 20 MG capsule Take 20 mg by mouth 2 (two) times daily before a meal.    . Polyethyl Glycol-Propyl Glycol (SYSTANE OP) Place 1 drop into both eyes 3 (three) times daily as needed (dry eyes).    [name] telmisartan (MICARDIS) 80 MG tablet Take 80 mg by mouth daily.    [name] thiamine (VITAMIN B-1) 100 MG tablet Take 100 mg by mouth daily.     No current facility-administered medications on file prior to visit.    Review of Systems  Constitutional: Positive for malaise/fatigue (improving). Negative for weight loss.  Respiratory: Positive for shortness of breath (improving). Negative for cough and hemoptysis.   Cardiovascular:  Negative for chest pain, palpitations, claudication and leg swelling.  Gastrointestinal: Negative for abdominal pain, blood in stool, constipation, heartburn and vomiting.  Genitourinary: Negative for dysuria.  Musculoskeletal: Positive for joint pain. Negative for myalgias.  Neurological: Negative for dizziness, focal weakness and headaches.  Endo/Heme/Allergies: Does not bruise/bleed easily.  Psychiatric/Behavioral: Negative for depression. The  patient is not nervous/anxious.   All other systems reviewed and are negative.  Blood pressure (!) 126/93, pulse (!) 120, temperature (!) 97 F (36.1 C), height 5' 11"  (1.803 m), weight 246 lb (111.6 kg), SpO2 97 %. Body mass index is 34.24 kg/m.  Physical Exam  Constitutional: He appears well-developed. No distress.  HENT:  Head: Atraumatic.  Eyes: Conjunctivae are normal.  Neck: Neck supple. No JVD present. No thyromegaly present.  Cardiovascular: Regular rhythm and normal heart sounds. Tachycardia present. Exam reveals no gallop.  No murmur heard. Pulses:      Carotid pulses are 2+ on the right side and 2+ on the left side.      Dorsalis pedis pulses are 2+ on the right side and 2+ on the left side.       Posterior tibial pulses are 2+ on the right side and 2+ on the left side.  Femoral and popliteal pulse difficult to feel due to patient's body habitus.  1-2+ bilateral leg edema.  Pulmonary/Chest: Effort normal and breath sounds normal.  Abdominal: Soft. Bowel sounds are normal.  Musculoskeletal: Normal range of motion.  Neurological: He is alert.  Skin: Skin is warm and dry.  Psychiatric: He has a normal mood and affect.   Laboratory examination  BMP Latest Ref Rng & Units 12/03/2018 11/14/2018 10/31/2018  Glucose 65 - 99 mg/dL 132(H) 110(H) 118(H)  BUN 8 - 27 mg/dL 33(H) 33(H) 33(H)  Creatinine 0.76 - 1.27 mg/dL 1.24 1.35(H) 1.18  BUN/Creat Ratio 10 - 24 27(H) 24 28(H)  Sodium 134 - 144 mmol/L 144 137 143  Potassium 3.5 - 5.2  mmol/L 5.0 5.7(H) 5.6(H)  Chloride 96 - 106 mmol/L 106 103 102  CO2 20 - 29 mmol/L 18(L) 17(L) 24  Calcium 8.6 - 10.2 mg/dL 9.1 9.9 9.8   Labs 09/10/2018: HB 15.8/HCT 46.3, mild macrocytosis present with MCV of 100.  Platelet count 154.  Potassium 4.9, BUN 24, creatinine 1.1, eGFR greater than 60 mL.  ALT and AST elevated at 64 and 57 respectively.  Total cholesterol 184, triglycerides 199, HDL 85, LDL 59.  Labs 07/12/2017: HB 15.6, HCT 47.8, platelets 155.  Normal indicis.  Potassium 4.8, serum glucose 160 mg, BUN 23, creatinine 1.1.  CMP normal.  Total cholesterol 232, triglycerides 138, HDL 62, LDL 07/10/40.  Non-HDL cholesterol 170.  Uric acid is elevated at 7.8, vitamin D 25 x 1.  CARDIAC STUDIES:   Echocardiogram 09/23/2018 :  Poor echo window. Wall motion abnormality has reduced sensitivity. Left ventricle cavity is normal in size. Moderate concentric hypertrophy of the left ventricle. Abnormal septal wall motion due to left bundle branch block. Cannot exclude anterio septal hypokinesis.  Normal diastolic filling pattern. Visual EF is 30-35%. Left atrial cavity is moderate to severely dilated at 4.7 cm. Moderate (Grade II) mitral regurgitation. Trace tricuspid regurgitation. Unable to estimate PA pressure due to absence/minimal TR signal.  Lexiscan myoview stress test 09/20/2018:  1. Lexiscan stress test was performed. Exercise capacity was not assessed. Stress symptoms included dizziness. Resting blood pressure was 164/108 mmHg and peak effect blood pressure was 174/116 mmHg. The resting and stress electrocardiogram demonstrated atrial fibrillation with rapid ventricular rate, occasional PVC, and normal rest repolarization.  Stress EKG is non diagnostic for ischemia as it is a pharmacologic stress. In addition, no ischemic changes seen at heart rate of 136 bpm.  2. The overall quality of the study is good.  Left ventricular cavity is noted to be enlarged on the rest  and stress studies.   Gated SPECT images reveal moderate global decrease in myocardial thickening and wall motion.  The left ventricular ejection fraction was calculated or visually estimated to be 34%.  REST and STRESS images demonstrate mildly decreased tracer uptake in the basal inferior, mid inferior and apical inferior segments of the left ventricle, with no reversibility. Findings suggestive of dilated nonishcemic cardiomyopathy.  3. High risk study due to reduced LVED. Recommend clinical correlation.   Assessment & Recommendations:   Paroxysmal atrial fibrillation (HCC) - Plan: EKG 12-Lead, sotalol (BETAPACE) 80 MG tablet 1/2 tablet BID  Essential hypertension - Plan: spironolactone (ALDACTONE) 25 MG tablet  Obstructive sleep apnea - Plan: Ambulatory referral to Pulmonology  Chronic combined systolic and diastolic heart failure ([place]) - Plan: hydrALAZINE (APRESOLINE) 25 MG tablet  Alcohol abuse   EKG 01/01/2019: Atrial flutter, probably typical with 2: 1 AV conduction at the rate of 140 bpm.  Left axis deviation.  Left anterior fascicular block.  IVCD, LVH.  Prolonged QT interval at 548 ms.  EKG 12/18/2018: Atrial flutter with 2:1 conduction, atypical atrial flutter.  Left axis deviation, left anterior fascicular block.  Incomplete right bundle branch block.  Nonspecific T abnormality. No significant change from  EKG 12/17/2018   EKG 10/17/2018: Normal sinus rhythm at the rate of 74 bpm, left axis deviation, left anterior fascicular block.  IVCD, LVH.  Single pair of Ventricular couplets.  Normal QT interval.  Nonspecific T abnormality.  EKG 09/11/2018: Atrial fibrillation with rapid ventricular response at the rate of 136 bpm, left axis deviation, left anterior fascicular block.  Incomplete right bundle branch block.  Nonspecific T abnormality.  Recommendation:   EKG reviewed, he persist to be in atrial flutter with rapid ventricular response.  He will continue sotalol, reduce the sotalol dose to 40 mg BID  and set up for direct current cardioversion. Discussed stopping alcohol abuse.   If he fails to maintain sinus, will refer for EP consult. Could also consider Tikosyn, but concerned about use of diuretics and alcohol.  He is scheduled for repeat echo next month for evaluation of LV systolic dysfunction. I will add Hydralazine for hypertension and also chronic systolic and diastolic CHF as well. His BP is controllled at home per patient.   He needs referral for sleep evaluation as he needs new equipment. He is compliant with CPAP use.   [name], MD, [facility] 01/02/2019, 9:49 PM [facility]. [place] Pager: [phone] Office: [phone] If no answer Cell [phone]

## 2019-01-02 ENCOUNTER — Encounter: Payer: Self-pay | Admitting: Cardiology

## 2019-01-10 ENCOUNTER — Other Ambulatory Visit: Payer: Self-pay | Admitting: Cardiology

## 2019-01-10 DIAGNOSIS — I480 Paroxysmal atrial fibrillation: Secondary | ICD-10-CM

## 2019-01-13 ENCOUNTER — Other Ambulatory Visit: Payer: Self-pay | Admitting: Cardiology

## 2019-01-13 ENCOUNTER — Other Ambulatory Visit: Payer: Self-pay | Admitting: Internal Medicine

## 2019-01-13 DIAGNOSIS — I480 Paroxysmal atrial fibrillation: Secondary | ICD-10-CM

## 2019-01-14 LAB — BASIC METABOLIC PANEL
BUN/Creatinine Ratio: 23 (ref 10–24)
BUN: 32 mg/dL — ABNORMAL HIGH (ref 8–27)
CO2: 22 mmol/L (ref 20–29)
Calcium: 9.6 mg/dL (ref 8.6–10.2)
Chloride: 99 mmol/L (ref 96–106)
Creatinine, Ser: 1.41 mg/dL — ABNORMAL HIGH (ref 0.76–1.27)
GFR calc Af Amer: 56 mL/min/{1.73_m2} — ABNORMAL LOW (ref >59)
GFR calc non Af Amer: 48 mL/min/{1.73_m2} — ABNORMAL LOW (ref >59)
Glucose: 96 mg/dL (ref 65–99)
Potassium: 6.5 mmol/L — ABNORMAL HIGH (ref 3.5–5.2)
Sodium: 135 mmol/L (ref 134–144)

## 2019-01-16 ENCOUNTER — Other Ambulatory Visit: Payer: Medicare Other

## 2019-01-16 ENCOUNTER — Other Ambulatory Visit (HOSPITAL_COMMUNITY)
Admission: RE | Admit: 2019-01-16 | Discharge: 2019-01-16 | Disposition: A | Discharge status: Home / Self Care | Payer: Medicare Other | Source: Ambulatory Visit | Attending: Internal Medicine | Admitting: Internal Medicine

## 2019-01-16 ENCOUNTER — Encounter (HOSPITAL_COMMUNITY): Payer: Self-pay | Admitting: Anesthesiology

## 2019-01-16 DIAGNOSIS — Z1159 Encounter for screening for other viral diseases: Secondary | ICD-10-CM | POA: Insufficient documentation

## 2019-01-16 DIAGNOSIS — Z01812 Encounter for preprocedural laboratory examination: Secondary | ICD-10-CM | POA: Insufficient documentation

## 2019-01-16 NOTE — Progress Notes (Signed)
Called patient re: quarantine, patient had Covid test this am. Reminded NPO and time to be at facility, patient understands.

## 2019-01-16 NOTE — Anesthesia Preprocedure Evaluation (Addendum)
Anesthesia Evaluation  Patient identified by MRN, date of birth, ID band Patient awake    Reviewed: Allergy & Precautions, NPO status , Patient's Chart, lab work & pertinent test results, reviewed documented beta blocker date and time   Airway Mallampati: III  TM Distance: >3 FB Neck ROM: Full    Dental no notable dental hx. (+) Teeth Intact   Pulmonary shortness of breath and with exertion, asthma , sleep apnea and Continuous Positive Airway Pressure Ventilation , COPD,  COPD inhaler, former smoker,    Pulmonary exam normal breath sounds clear to auscultation       Cardiovascular hypertension, Pt. on medications and Pt. on home beta blockers + dysrhythmias Atrial Fibrillation  Rhythm:Irregular Rate:Tachycardia  Bilateral Lower extremity edema   Neuro/Psych negative neurological ROS  negative psych ROS   GI/Hepatic Neg liver ROS, GERD  Medicated and Controlled,  Endo/Other  Obesity HLD  Renal/GU Renal InsufficiencyRenal disease   Prostate Ca    Musculoskeletal  (+) Arthritis , Osteoarthritis,    Abdominal (+) + obese,   Peds  Hematology Eliquis therapy - last dose this am   Anesthesia Other Findings   Reproductive/Obstetrics                          Anesthesia Physical Anesthesia Plan  ASA: III  Anesthesia Plan: General   Post-op Pain Management:    Induction: Intravenous  PONV Risk Score and Plan: 2 and Ondansetron and Treatment may vary due to age or medical condition  Airway Management Planned: Mask  Additional Equipment:   Intra-op Plan:   Post-operative Plan:   Informed Consent: I have reviewed the patients History and Physical, chart, labs and discussed the procedure including the risks, benefits and alternatives for the proposed anesthesia with the patient or authorized representative who has indicated his/her understanding and acceptance.     Dental advisory  given  Plan Discussed with: CRNA  Anesthesia Plan Comments:        Anesthesia Quick Evaluation

## 2019-01-17 ENCOUNTER — Ambulatory Visit (HOSPITAL_COMMUNITY): Payer: Medicare Other | Admitting: Anesthesiology

## 2019-01-17 ENCOUNTER — Encounter (HOSPITAL_COMMUNITY): Payer: Self-pay | Admitting: *Deleted

## 2019-01-17 ENCOUNTER — Other Ambulatory Visit: Payer: Self-pay

## 2019-01-17 ENCOUNTER — Encounter (HOSPITAL_COMMUNITY)
Admission: RE | Disposition: A | Discharge status: Home / Self Care | Payer: Self-pay | Source: Home / Self Care | Attending: Cardiology

## 2019-01-17 ENCOUNTER — Ambulatory Visit (HOSPITAL_COMMUNITY)
Admission: RE | Admit: 2019-01-17 | Discharge: 2019-01-17 | Disposition: A | Discharge status: Home / Self Care | Payer: Medicare Other | Attending: Cardiology | Admitting: Cardiology

## 2019-01-17 DIAGNOSIS — F1010 Alcohol abuse, uncomplicated: Secondary | ICD-10-CM | POA: Diagnosis not present

## 2019-01-17 DIAGNOSIS — M109 Gout, unspecified: Secondary | ICD-10-CM | POA: Insufficient documentation

## 2019-01-17 DIAGNOSIS — Z7951 Long term (current) use of inhaled steroids: Secondary | ICD-10-CM | POA: Insufficient documentation

## 2019-01-17 DIAGNOSIS — Z6832 Body mass index (BMI) 32.0-32.9, adult: Secondary | ICD-10-CM | POA: Insufficient documentation

## 2019-01-17 DIAGNOSIS — I484 Atypical atrial flutter: Secondary | ICD-10-CM | POA: Diagnosis not present

## 2019-01-17 DIAGNOSIS — K219 Gastro-esophageal reflux disease without esophagitis: Secondary | ICD-10-CM | POA: Diagnosis not present

## 2019-01-17 DIAGNOSIS — M1990 Unspecified osteoarthritis, unspecified site: Secondary | ICD-10-CM | POA: Insufficient documentation

## 2019-01-17 DIAGNOSIS — I4510 Unspecified right bundle-branch block: Secondary | ICD-10-CM | POA: Diagnosis not present

## 2019-01-17 DIAGNOSIS — Z8546 Personal history of malignant neoplasm of prostate: Secondary | ICD-10-CM | POA: Diagnosis not present

## 2019-01-17 DIAGNOSIS — E669 Obesity, unspecified: Secondary | ICD-10-CM | POA: Insufficient documentation

## 2019-01-17 DIAGNOSIS — Z7901 Long term (current) use of anticoagulants: Secondary | ICD-10-CM | POA: Insufficient documentation

## 2019-01-17 DIAGNOSIS — R7303 Prediabetes: Secondary | ICD-10-CM | POA: Diagnosis not present

## 2019-01-17 DIAGNOSIS — I5042 Chronic combined systolic (congestive) and diastolic (congestive) heart failure: Secondary | ICD-10-CM | POA: Diagnosis not present

## 2019-01-17 DIAGNOSIS — G4733 Obstructive sleep apnea (adult) (pediatric): Secondary | ICD-10-CM | POA: Diagnosis not present

## 2019-01-17 DIAGNOSIS — I4819 Other persistent atrial fibrillation: Secondary | ICD-10-CM

## 2019-01-17 DIAGNOSIS — I480 Paroxysmal atrial fibrillation: Secondary | ICD-10-CM | POA: Diagnosis present

## 2019-01-17 DIAGNOSIS — J45909 Unspecified asthma, uncomplicated: Secondary | ICD-10-CM | POA: Insufficient documentation

## 2019-01-17 DIAGNOSIS — Z79899 Other long term (current) drug therapy: Secondary | ICD-10-CM | POA: Diagnosis not present

## 2019-01-17 DIAGNOSIS — I110 Hypertensive heart disease with heart failure: Secondary | ICD-10-CM | POA: Diagnosis not present

## 2019-01-17 DIAGNOSIS — Z87891 Personal history of nicotine dependence: Secondary | ICD-10-CM | POA: Diagnosis not present

## 2019-01-17 DIAGNOSIS — I420 Dilated cardiomyopathy: Secondary | ICD-10-CM | POA: Insufficient documentation

## 2019-01-17 DIAGNOSIS — E785 Hyperlipidemia, unspecified: Secondary | ICD-10-CM | POA: Insufficient documentation

## 2019-01-17 HISTORY — PX: CARDIOVERSION: SHX1299

## 2019-01-17 LAB — POCT I-STAT 4, (NA,K, GLUC, HGB,HCT)
Glucose, Bld: 97 mg/dL (ref 70–99)
HCT: 42 % (ref 39.0–52.0)
Hemoglobin: 14.3 g/dL (ref 13.0–17.0)
Potassium: 5.1 mmol/L (ref 3.5–5.1)
Sodium: 134 mmol/L — ABNORMAL LOW (ref 135–145)

## 2019-01-17 LAB — SARS CORONAVIRUS 2 (TAT 6-24 HRS): SARS Coronavirus 2: NEGATIVE

## 2019-01-17 SURGERY — CARDIOVERSION
Anesthesia: General

## 2019-01-17 MED ORDER — LIDOCAINE 2% (20 MG/ML) 5 ML SYRINGE
INTRAMUSCULAR | Status: DC | PRN
  Administered 2019-01-17: 40 mg via INTRAVENOUS

## 2019-01-17 MED ORDER — PROPOFOL 10 MG/ML IV BOLUS
INTRAVENOUS | Status: DC | PRN
  Administered 2019-01-17: 50 mg via INTRAVENOUS

## 2019-01-17 MED ORDER — SODIUM CHLORIDE 0.9 % IV SOLN
INTRAVENOUS | Status: AC | PRN
  Administered 2019-01-17: 500 mL via INTRAVENOUS

## 2019-01-17 MED ORDER — METOPROLOL TARTRATE 50 MG PO TABS: 50.0000 mg | ORAL_TABLET | Freq: Two times a day (BID) | ORAL | 2 refills | Status: DC

## 2019-01-17 NOTE — Discharge Instructions (Signed)

## 2019-01-17 NOTE — Anesthesia Postprocedure Evaluation (Signed)
Anesthesia Post Note  Patient: RIGGIN CUTTINO  Procedure(s) Performed: CARDIOVERSION (N/A )     Patient location during evaluation: Endoscopy Anesthesia Type: General Level of consciousness: awake and alert and oriented Pain management: pain level controlled Vital Signs Assessment: post-procedure vital signs reviewed and stable Respiratory status: spontaneous breathing, nonlabored ventilation and respiratory function stable Cardiovascular status: blood pressure returned to baseline and stable Postop Assessment: no apparent nausea or vomiting Anesthetic complications: no    Last Vitals:  Vitals:   01/17/19 0800 01/17/19 0805  BP: 107/76 117/87  Pulse: (!) 55 63  Resp: (!) 24 18  Temp:    SpO2: 95% 97%    Last Pain:  Vitals:   01/17/19 0805  TempSrc:   PainSc: 0-No pain                 Anniah Glick,[place] A.

## 2019-01-17 NOTE — CV Procedure (Signed)
Direct current cardioversion:  Indication symptomatic A. Fibrillation.  Procedure: Using 50 mg of IV Propofol and 40 IV Lidocaine (for reducing venous pain) for achieving deep sedation, synchronized direct current cardioversion performed. Patient was delivered with 150 Joules of electricity X 1 with success to NSR. Patient tolerated the procedure well. No immediate complication noted.

## 2019-01-17 NOTE — Interval H&P Note (Signed)
History and Physical Interval Note:  01/17/2019 7:42 AM  Darren [name]  has presented today for surgery, with the diagnosis of A-FIB.  The various methods of treatment have been discussed with the patient and family. After consideration of risks, benefits and other options for treatment, the patient has consented to  Procedure(s): CARDIOVERSION (N/A) as a surgical intervention.  The patient's history has been reviewed, patient examined, no change in status, stable for surgery.  I have reviewed the patient's chart and labs.  Questions were answered to the patient's satisfaction.     [name]

## 2019-01-17 NOTE — Transfer of Care (Signed)
Immediate Anesthesia Transfer of Care Note  Patient: Darren [name]  Procedure(s) Performed: CARDIOVERSION (N/A )  Patient Location: PACU and Endoscopy Unit  Anesthesia Type:General  Level of Consciousness: awake, alert , oriented and drowsy  Airway & Oxygen Therapy: Patient Spontanous Breathing  Post-op Assessment: Report given to RN and Post -op Vital signs reviewed and stable  Post vital signs: Reviewed and stable  Last Vitals:  Vitals Value Taken Time  BP    Temp    Pulse    Resp    SpO2      Last Pain:  Vitals:   01/17/19 0648  TempSrc: Oral  PainSc: 0-No pain         Complications: No apparent anesthesia complications

## 2019-01-17 NOTE — Anesthesia Procedure Notes (Signed)
Date/Time: 01/17/2019 7:43 AM Performed by: Trinna Post., CRNA Pre-anesthesia Checklist: Patient identified, Emergency Drugs available, Suction available, Patient being monitored and Timeout performed Patient Re-evaluated:Patient Re-evaluated prior to induction Oxygen Delivery Method: Ambu bag Preoxygenation: Pre-oxygenation with 100% oxygen Induction Type: IV induction Placement Confirmation: positive ETCO2

## 2019-01-20 ENCOUNTER — Telehealth: Payer: Self-pay

## 2019-01-20 ENCOUNTER — Encounter (HOSPITAL_COMMUNITY): Payer: Self-pay | Admitting: Cardiology

## 2019-01-20 NOTE — Telephone Encounter (Signed)
Pt called stating that he I staking metoprolol 50bid and his HR runs 60-70 but today it is 50. He is questioning if he should be taking 25mg  bid instead due to HR. Please advise.//ah

## 2019-01-20 NOTE — Telephone Encounter (Signed)
Do not change anything until seen by [place]

## 2019-01-21 ENCOUNTER — Ambulatory Visit (INDEPENDENT_AMBULATORY_CARE_PROVIDER_SITE_OTHER): Payer: Medicare Other | Admitting: Nurse Practitioner

## 2019-01-21 ENCOUNTER — Ambulatory Visit: Payer: Medicare Other | Admitting: Internal Medicine

## 2019-01-21 ENCOUNTER — Other Ambulatory Visit: Payer: Self-pay

## 2019-01-21 ENCOUNTER — Encounter: Payer: Self-pay | Admitting: Nurse Practitioner

## 2019-01-21 ENCOUNTER — Ambulatory Visit (INDEPENDENT_AMBULATORY_CARE_PROVIDER_SITE_OTHER): Payer: Medicare Other | Admitting: Internal Medicine

## 2019-01-21 VITALS — BP 114/68 | HR 60 | Temp 98.1°F | Ht 71.0 in | Wt 239.0 lb

## 2019-01-21 DIAGNOSIS — J45991 Cough variant asthma: Secondary | ICD-10-CM

## 2019-01-21 DIAGNOSIS — J449 Chronic obstructive pulmonary disease, unspecified: Secondary | ICD-10-CM | POA: Diagnosis not present

## 2019-01-21 LAB — PULMONARY FUNCTION TEST
DL/VA % pred: 132 %
DL/VA: 5.21 ml/min/mmHg/L
DLCO unc % pred: 104 %
DLCO unc: 27.01 ml/min/mmHg
FEF 25-75 Post: 0.99 L/s
FEF 25-75 Pre: 0.61 L/s
FEF2575-%Change-Post: 64 %
FEF2575-%Pred-Post: 43 %
FEF2575-%Pred-Pre: 26 %
FEV1-%Change-Post: 23 %
FEV1-%Pred-Post: 46 %
FEV1-%Pred-Pre: 38 %
FEV1-Post: 1.48 L
FEV1-Pre: 1.2 L
FEV1FVC-%Change-Post: 7 %
FEV1FVC-%Pred-Pre: 74 %
FEV6-%Change-Post: 15 %
FEV6-%Pred-Post: 61 %
FEV6-%Pred-Pre: 53 %
FEV6-Post: 2.52 L
FEV6-Pre: 2.18 L
FEV6FVC-%Change-Post: 0 %
FEV6FVC-%Pred-Post: 105 %
FEV6FVC-%Pred-Pre: 104 %
FVC-%Change-Post: 14 %
FVC-%Pred-Post: 58 %
FVC-%Pred-Pre: 50 %
FVC-Post: 2.54 L
FVC-Pre: 2.22 L
Post FEV1/FVC ratio: 58 %
Post FEV6/FVC ratio: 99 %
Pre FEV1/FVC ratio: 54 %
Pre FEV6/FVC Ratio: 98 %
RV % pred: 167 %
RV: 4.41 L
TLC % pred: 96 %
TLC: 6.96 L

## 2019-01-21 NOTE — Progress Notes (Signed)
PFT done today. 

## 2019-01-21 NOTE — Progress Notes (Signed)
@Patient  ID: Darren [name], male    DOB: 12-21-1940, 78 y.o.   MRN: [id number]  Chief Complaint  Patient presents with  . Results    Discuss results of PFT    Referring provider: [name], MD  HPI  78 year old male former smoker with cough variant asthma/component of UACS, COPD, obesity. Patient is followed by Dr. Melvyn Novas.   Tests: FENO 03/30/2016  =   47 on just saba  - 03/30/2016  After extensive coaching HFA effectiveness =    90% :  Try symbicort 80 2bid x 6 weeks then return with repeat spirometry >  04/06/2016 notified insurance prefers advair > try 115 hfa 2 bid   - Allergy profile 05/10/2016 >  Eos 0.4 /  IgE 60 Pos Grass/ cedar trees   - 05/10/2016 changed back to 4 weeks of symb 80 p flared on advair - FENO 06/07/2016  =   10 on symb 80 2bid  - Spirometry 06/07/2016  FEV1 1.89 (58%)  Ratio  71   - 09/06/2016   try dulera 200 2bid - 11/28/2016 excess shaking on duelra 200 2bid so try one bid and off gerd rx since is not convinced worked - Sinus CT 11/29/2016 >>> Mucosal edema in the maxillary and ethmoid sinuses bilaterally. No air-fluid level. - Singulair 10 mg started first week in Sept 2018   - 07/17/2017   rechallenge with the dulera 100   - 07/24/2018  After extensive coaching inhaler device,  effectiveness =    90% but some "congestion" since started labetolol   PFT Results Latest Ref Rng & Units 01/21/2019  FVC-Pre L 2.22  FVC-Predicted Pre % 50  FVC-Post L 2.54  FVC-Predicted Post % 58  Pre FEV1/FVC % % 54  Post FEV1/FCV % % 58  FEV1-Pre L 1.20  FEV1-Predicted Pre % 38  FEV1-Post L 1.48  DLCO UNC% % 104  DLCO COR %Predicted % 132  TLC L 6.96  TLC % Predicted % 96  RV % Predicted % 167     OV 01/22/19 - follow up Patient presents today for follow-up and PFT.  Patient did use his Dulera before his PFT this morning. We discussed results of PFT in office today.  PFT was stable since last spirometry in 2017 and did show obstruction with FEV1 at 46 with positive  post bronchodilator reactivity at14-23%.  DLCO was normal.  Patient states that he has been doing well since his last visit.  He did have to see cardiology for a cardioversion recently due to A. fib.  He states that he has been feeling much better since this time.  He now has less shortness of breath with exertion.  Denies any significant cough at this time.  He does complain of some postnasal drip with allergies.  He has decided to cut back and try to quit drinking.  States that this is helping as well.  He is trying to lose weight and become more active.  Patient is compliant with Dulera and Singulair. Denies f/c/s, n/v/d, hemoptysis, PND, leg swelling.     Allergies  Allergen Reactions  . Amlodipine     Leg edema  . Penicillins Hives    Has patient had a PCN reaction causing immediate rash, facial/tongue/throat swelling, SOB or lightheadedness with hypotension: No Has patient had a PCN reaction causing severe rash involving mucus membranes or skin necrosis: No Has patient had a PCN reaction that required hospitalization: No Has patient had a PCN reaction occurring  within the last 10 years: No--childhood reaction If all of the above answers are "NO", then may proceed with Cephalosporin use.    Immunization History  Administered Date(s) Administered  . Influenza Whole 04/09/2017  . Influenza-Unspecified 03/24/2016  . Pneumococcal-Unspecified 01/09/2014    Past Medical History:  Diagnosis Date  . Arthritis    "maybe a little"  . Asthma    "mild"  . Atrial fibrillation ([place])   . bilateral leg edema 09/11/2018  . Cancer [facility])    prostate cancer  . Dyspnea   . Dyspnea on exertion 09/11/2018  . Dysrhythmia    Afib with rapid ventricular  . Essential hypertension 07/25/2018  . Fatty liver   . GERD (gastroesophageal reflux disease)   . GI bleed 2018  . Gout   . History of blood transfusion 2018  . Obstructive sleep apnea 09/11/2018  . OSA (obstructive sleep apnea)   . Paroxysmal  atrial fibrillation ([place]) 09/11/2018  . Pre-diabetes     Tobacco History: Social History   Tobacco Use  Smoking Status Former Smoker  . Packs/day: 1.00  . Years: 25.00  . Pack years: 25.00  . Quit date: 07/10/1988  . Years since quitting: 30.5  Smokeless Tobacco Never Used   Counseling given: Yes   Outpatient Encounter Medications as of 01/21/2019  Medication Sig  . allopurinol (ZYLOPRIM) 100 MG tablet Take 100 mg by mouth daily.   [name] ammonium lactate (AMLACTIN) 12 % cream Apply 1 g topically as needed for dry skin (scaly feet).  [name] apixaban (ELIQUIS) 5 MG TABS tablet Take 1 tablet (5 mg total) by mouth 2 (two) times daily.  [name] atorvastatin (LIPITOR) 40 MG tablet Take 40 mg by mouth daily.   . Cholecalciferol (VITAMIN D-3 PO) Take 2,000 Units by mouth daily.   . colchicine 0.6 MG tablet Take 0.6 mg by mouth daily as needed (gout).   . colesevelam (WELCHOL) 625 MG tablet Take 1,875 mg by mouth 2 (two) times daily with a meal.   . hydrALAZINE (APRESOLINE) 25 MG tablet Take 1 tablet (25 mg total) by mouth 3 (three) times daily.  [name] L-Methylfolate-Algae-B12-B6 (METANX) 3-90.314-2-35 MG CAPS Take 1 tablet by mouth 2 (two) times daily.   . Magnesium 400 MG TABS Take 400 mg by mouth daily at 12 noon.   . metoprolol tartrate (LOPRESSOR) 50 MG tablet Take 1 tablet (50 mg total) by mouth 2 (two) times daily.  . mometasone-formoterol (DULERA) 100-5 MCG/ACT AERO Inhale 2 puffs into the lungs 2 (two) times daily.  . montelukast (SINGULAIR) 10 MG tablet TAKE 1 TABLET BY MOUTH AT BEDTIME (Patient taking differently: Take 10 mg by mouth at bedtime. )  . omeprazole (PRILOSEC) 20 MG capsule Take 20 mg by mouth 2 (two) times daily before a meal.  . Polyethyl Glycol-Propyl Glycol (SYSTANE OP) Place 1 drop into both eyes 3 (three) times daily as needed (dry eyes).  . sotalol (BETAPACE) 80 MG tablet Take 0.5 tablets (40 mg total) by mouth 2 (two) times daily.  [name] spironolactone (ALDACTONE) 25 MG tablet Take 1  tablet (25 mg total) by mouth daily for 30 days.  [name] telmisartan (MICARDIS) 80 MG tablet Take 80 mg by mouth daily.  [name] thiamine (VITAMIN B-1) 100 MG tablet Take 100 mg by mouth daily.   No facility-administered encounter medications on file as of 01/21/2019.      Review of Systems  Review of Systems  Constitutional: Negative.  Negative for chills and fever.  HENT: Negative.  Respiratory: Positive for shortness of breath. Negative for cough and wheezing.   Cardiovascular: Negative.  Negative for chest pain, palpitations and leg swelling.  Gastrointestinal: Negative.   Allergic/Immunologic: Negative.   Neurological: Negative.   Psychiatric/Behavioral: Negative.        Physical Exam  BP 114/68 (BP Location: Left Arm, Patient Position: Sitting, Cuff Size: Normal)   Pulse 60   Temp 98.1 F (36.7 C)   Ht 5\' 11"  (1.803 m)   Wt 239 lb (108.4 kg)   SpO2 95%   BMI 33.33 kg/m   Wt Readings from Last 5 Encounters:  01/21/19 239 lb (108.4 kg)  01/17/19 235 lb (106.6 kg)  01/01/19 245 lb 8 oz (111.4 kg)  12/18/18 246 lb (111.6 kg)  12/17/18 246 lb (111.6 kg)     Physical Exam Vitals signs and nursing note reviewed.  Constitutional:      General: He is not in acute distress.    Appearance: He is well-developed.  Cardiovascular:     Rate and Rhythm: Normal rate and regular rhythm.  Pulmonary:     Effort: Pulmonary effort is normal. No respiratory distress.     Breath sounds: Normal breath sounds. No wheezing or rhonchi.  Musculoskeletal:        General: No swelling.  Skin:    General: Skin is warm and dry.  Neurological:     Mental Status: He is alert and oriented to person, place, and time.       Assessment & Plan:   COPD  GOLD II/III  Patient presents today for follow-up and PFT.  Patient did use his Dulera before his PFT this morning. We discussed results of PFT in office today.  PFT was stable since last spirometry in 2017 and did show obstruction with FEV1 at  46 with positive post bronchodilator reactivity at14-23%.  DLCO was normal.  Patient states that he has been doing well since his last visit.  He did have to see cardiology for a cardioversion recently due to A. fib.  He states that he has been feeling much better since this time.  He now has less shortness of breath with exertion.  Denies any significant cough at this time.  He does complain of some postnasal drip with allergies.  He has decided to cut back and try to quit drinking.  States that this is helping as well.  He is trying to lose weight and become more active.  Patient is compliant with Dulera and Singulair.  Patient Instructions  Continue Ruthe Mannan Continue Singulair May start walking routine - slowly Work on healthy weight May take zyrtec  Follow up: Follow up with Dr. Melvyn Novas in 3 months with PFT same day or sooner if needed        Fenton Foy, NP 01/22/2019

## 2019-01-21 NOTE — Patient Instructions (Addendum)
Continue Dulera Continue Singulair May start walking routine - slowly Work on healthy weight  May take zyrtec  Follow up: Follow up with Dr. Melvyn Novas in 3 months with PFT same day or sooner if needed

## 2019-01-21 NOTE — Telephone Encounter (Signed)
LMOM advising.//ah

## 2019-01-22 ENCOUNTER — Encounter: Payer: Self-pay | Admitting: Nurse Practitioner

## 2019-01-22 NOTE — Assessment & Plan Note (Signed)
Patient presents today for follow-up and PFT.  Patient did use his Dulera before his PFT this morning. We discussed results of PFT in office today.  PFT was stable since last spirometry in 2017 and did show obstruction with FEV1 at 46 with positive post bronchodilator reactivity at14-23%.  DLCO was normal.  Patient states that he has been doing well since his last visit.  He did have to see cardiology for a cardioversion recently due to A. fib.  He states that he has been feeling much better since this time.  He now has less shortness of breath with exertion.  Denies any significant cough at this time.  He does complain of some postnasal drip with allergies.  He has decided to cut back and try to quit drinking.  States that this is helping as well.  He is trying to lose weight and become more active.  Patient is compliant with Dulera and Singulair.  Patient Instructions  Continue Ruthe Mannan Continue Singulair May start walking routine - slowly Work on healthy weight May take zyrtec  Follow up: Follow up with Dr. Melvyn Novas in 3 months with PFT same day or sooner if needed

## 2019-01-24 ENCOUNTER — Ambulatory Visit: Payer: Medicare Other | Admitting: Cardiology

## 2019-01-31 ENCOUNTER — Other Ambulatory Visit: Payer: Self-pay | Admitting: Internal Medicine

## 2019-02-04 ENCOUNTER — Ambulatory Visit: Payer: Medicare Other | Admitting: Cardiology

## 2019-02-10 ENCOUNTER — Ambulatory Visit: Payer: Medicare Other | Admitting: Cardiology

## 2019-02-10 NOTE — Progress Notes (Signed)
Chart and office note reviewed in detail  > agree with a/p as outlined    

## 2019-02-11 ENCOUNTER — Ambulatory Visit (INDEPENDENT_AMBULATORY_CARE_PROVIDER_SITE_OTHER): Payer: Medicare Other

## 2019-02-11 ENCOUNTER — Other Ambulatory Visit: Payer: Self-pay

## 2019-02-11 DIAGNOSIS — I5042 Chronic combined systolic (congestive) and diastolic (congestive) heart failure: Secondary | ICD-10-CM | POA: Diagnosis not present

## 2019-02-20 ENCOUNTER — Ambulatory Visit (INDEPENDENT_AMBULATORY_CARE_PROVIDER_SITE_OTHER): Payer: Medicare Other | Admitting: Cardiology

## 2019-02-20 ENCOUNTER — Encounter: Payer: Self-pay | Admitting: Cardiology

## 2019-02-20 ENCOUNTER — Other Ambulatory Visit: Payer: Self-pay

## 2019-02-20 VITALS — BP 119/52 | HR 48 | Temp 98.6°F | Ht 71.0 in | Wt 243.7 lb

## 2019-02-20 DIAGNOSIS — I10 Essential (primary) hypertension: Secondary | ICD-10-CM

## 2019-02-20 DIAGNOSIS — I480 Paroxysmal atrial fibrillation: Secondary | ICD-10-CM | POA: Diagnosis not present

## 2019-02-20 DIAGNOSIS — F1010 Alcohol abuse, uncomplicated: Secondary | ICD-10-CM

## 2019-02-20 DIAGNOSIS — G4733 Obstructive sleep apnea (adult) (pediatric): Secondary | ICD-10-CM

## 2019-02-20 MED ORDER — METOPROLOL TARTRATE 25 MG PO TABS: 25.0000 mg | ORAL_TABLET | Freq: Two times a day (BID) | ORAL | 3 refills | Status: DC

## 2019-02-20 NOTE — Progress Notes (Signed)
Subjective:  Primary Physician:  [name], MD  Patient ID: Darren [name], male    DOB: Mar 24, 1941, 78 y.o.   MRN: [id number]  Chief Complaint  Patient presents with  . Atrial Fibrillation    S/P Cardioversion  . Follow-up    4wk    HPI: Darren [name]  is a 78 y.o. male  with moderate obesity, paroxysmal atrial fibrillation and probably sinus node dysfunction with  sinus bradycardia even on minimal dose of beta-blocker, OSA on CPAP, hyperlipidemia, bronchial asthma and history of excessive alcohol intake. He was started on sotalol on 12/18/2018 and successful cardioversion on 07/10/20and now presents for follow-up. He has also recently had echocardiogram for follow up on LV dysfunction.  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 He was started on sotalol and due to continued symptomatic A fib, underwent cardioversion on 01/17/19.  He reports that he is overall feeling much better since cardioversion. Weight, dyspnea, and energy levels have improved. He occasionally notices "skipped beats", but no heart racing. Tolerating medications well.   He has cut back on his drinking to 2-3 oz of alcohol night.  Past Medical History:  Diagnosis Date  . Arthritis    "maybe a little"  . Asthma    "mild"  . Atrial fibrillation ([place])   . bilateral leg edema 09/11/2018  . Cancer [facility])    prostate cancer  . Dyspnea   . Dyspnea on exertion 09/11/2018  . Dysrhythmia    Afib with rapid ventricular  . Essential hypertension 07/25/2018  . Fatty liver   . GERD (gastroesophageal reflux disease)   . GI bleed 2018  . Gout   . History of blood transfusion 2018  . Obstructive sleep apnea 09/11/2018  .  OSA (obstructive sleep apnea)   . Paroxysmal atrial fibrillation ([place]) 09/11/2018  . Pre-diabetes     Past Surgical History:  Procedure Laterality Date  . CARDIOVERSION N/A 10/08/2018   Procedure: CARDIOVERSION;  Surgeon: [name], MD;  Location: [facility] ENDOSCOPY;  Service: Cardiovascular;  Laterality: N/A;  . CARDIOVERSION N/A 01/17/2019   Procedure: CARDIOVERSION;  Surgeon: [name], MD;  Location: [place];  Service: Cardiovascular;  Laterality: N/A;  . COLONOSCOPY Left 03/10/2017   Procedure: COLONOSCOPY;  Surgeon: Arta Silence, MD;  Location: [facility] ENDOSCOPY;  Service: Endoscopy;  Laterality: Left;  . ESOPHAGOGASTRODUODENOSCOPY (EGD) WITH PROPOFOL Left 03/09/2017   Procedure: ESOPHAGOGASTRODUODENOSCOPY (EGD) WITH PROPOFOL;  Surgeon: Ronnette Juniper, MD;  Location: [place];  Service: Gastroenterology;  Laterality: Left;  . EYE SURGERY Bilateral    cataract   . FINGER SURGERY Right    5th  . Prostate     DaVinci    Social History   Socioeconomic History  . Marital status: Married    Spouse name: Not on file  . Number of children: 4  . Years of education: Not on file  . Highest education level: Not on file  Occupational History  . Not on file  Social Needs  . Financial resource strain: Not on file  . Food insecurity    Worry: Not on file    Inability: Not on file  . Transportation needs    Medical: Not on file  Non-medical: Not on file  Tobacco Use  . Smoking status: Former Smoker    Packs/day: 1.00    Years: 25.00    Pack years: 25.00    Quit date: 07/10/1988    Years since quitting: 30.6  . Smokeless tobacco: Never Used  Substance and Sexual Activity  . Alcohol use: Yes    Alcohol/week: 25.0 standard drinks    Types: 25 Shots of liquor per week  . Drug use: No  . Sexual activity: Not on file  Lifestyle  . Physical activity    Days per week: Not on file    Minutes per session: Not on file  . Stress: Not on file  Relationships  . Social Product manager on phone: Not on file    Gets together: Not on file    Attends religious service: Not on file    Active member of club or organization: Not on file    Attends meetings of clubs or organizations: Not on file    Relationship status: Not on file  . Intimate partner violence    Fear of current or ex partner: Not on file    Emotionally abused: Not on file    Physically abused: Not on file    Forced sexual activity: Not on file  Other Topics Concern  . Not on file  Social History Narrative  . Not on file    Current Outpatient Medications on File Prior to Visit  Medication Sig Dispense Refill  . allopurinol (ZYLOPRIM) 100 MG tablet Take 100 mg by mouth daily.     [name] ammonium lactate (AMLACTIN) 12 % cream Apply 1 g topically as needed for dry skin (scaly feet).    [name] apixaban (ELIQUIS) 5 MG TABS tablet Take 1 tablet (5 mg total) by mouth 2 (two) times daily. 180 tablet 1  . atorvastatin (LIPITOR) 40 MG tablet Take 40 mg by mouth daily.     . Cholecalciferol (VITAMIN D-3 PO) Take 2,000 Units by mouth daily.     . colchicine 0.6 MG tablet Take 0.6 mg by mouth daily as needed (gout).     . colesevelam (WELCHOL) 625 MG tablet Take 1,875 mg by mouth 2 (two) times daily with a meal.     . hydrALAZINE (APRESOLINE) 25 MG tablet Take 1 tablet (25 mg total) by mouth 3 (three) times daily. 90 tablet 3  . L-Methylfolate-Algae-B12-B6 (METANX) 3-90.314-2-35 MG CAPS Take 1 tablet by mouth 2 (two) times daily.     . Magnesium 400 MG TABS Take 400 mg by mouth daily at 12 noon.     . metoprolol tartrate (LOPRESSOR) 50 MG tablet Take 1 tablet (50 mg total) by mouth 2 (two) times daily. 60 tablet 2  . mometasone-formoterol (DULERA) 100-5 MCG/ACT AERO Inhale 2 puffs into the lungs 2 (two) times daily. 3 Inhaler 3  . montelukast (SINGULAIR) 10 MG tablet TAKE 1 TABLET BY MOUTH AT BEDTIME 30 tablet 11  . omeprazole (PRILOSEC) 20 MG capsule Take 20 mg by mouth 2 (two) times daily before a meal.    . Polyethyl  Glycol-Propyl Glycol (SYSTANE OP) Place 1 drop into both eyes 3 (three) times daily as needed (dry eyes).    . sotalol (BETAPACE) 80 MG tablet Take 0.5 tablets (40 mg total) by mouth 2 (two) times daily. 60 tablet 1  . spironolactone (ALDACTONE) 25 MG tablet Take 1 tablet (25 mg total) by mouth daily for 30 days. 90 tablet 1  . telmisartan (  MICARDIS) 80 MG tablet Take 80 mg by mouth daily.    [name] thiamine (VITAMIN B-1) 100 MG tablet Take 100 mg by mouth daily.     No current facility-administered medications on file prior to visit.    Review of Systems  Constitutional: Negative for malaise/fatigue (essentially resolved) and weight loss.  Respiratory: Negative for cough, hemoptysis and shortness of breath (essentially resolved).   Cardiovascular: Negative for chest pain, palpitations, claudication and leg swelling.  Gastrointestinal: Negative for abdominal pain, blood in stool, constipation, heartburn and vomiting.  Genitourinary: Negative for dysuria.  Musculoskeletal: Positive for joint pain. Negative for myalgias.  Neurological: Negative for dizziness, focal weakness and headaches.  Endo/Heme/Allergies: Does not bruise/bleed easily.  Psychiatric/Behavioral: Negative for depression. The patient is not nervous/anxious.   All other systems reviewed and are negative.  Blood pressure (!) 126/93, pulse (!) 120, temperature (!) 97 F (36.1 C), height 5' 11"  (1.803 m), weight 246 lb (111.6 kg), SpO2 97 %. Body mass index is 33.99 kg/m.  Physical Exam  Constitutional: He appears well-developed. No distress.  HENT:  Head: Atraumatic.  Eyes: Conjunctivae are normal.  Neck: Neck supple. No JVD present. No thyromegaly present.  Cardiovascular: Normal rate, regular rhythm and normal heart sounds. Exam reveals no gallop.  No murmur heard. Pulses:      Carotid pulses are 2+ on the right side and 2+ on the left side.      Dorsalis pedis pulses are 2+ on the right side and 2+ on the left side.        Posterior tibial pulses are 2+ on the right side and 2+ on the left side.  Femoral and popliteal pulse difficult to feel due to patient's body habitus.  1-2+ bilateral leg edema.  Pulmonary/Chest: Effort normal and breath sounds normal.  Abdominal: Soft. Bowel sounds are normal.  Musculoskeletal: Normal range of motion.  Neurological: He is alert.  Skin: Skin is warm and dry.  Psychiatric: He has a normal mood and affect.   Laboratory examination  BMP Latest Ref Rng & Units 01/17/2019 01/13/2019 12/03/2018  Glucose 70 - 99 mg/dL 97 96 132(H)  BUN 8 - 27 mg/dL - 32(H) 33(H)  Creatinine 0.76 - 1.27 mg/dL - 1.41(H) 1.24  BUN/Creat Ratio 10 - 24 - 23 27(H)  Sodium 135 - 145 mmol/L 134(L) 135 144  Potassium 3.5 - 5.1 mmol/L 5.1 6.5(H) 5.0  Chloride 96 - 106 mmol/L - 99 106  CO2 20 - 29 mmol/L - 22 18(L)  Calcium 8.6 - 10.2 mg/dL - 9.6 9.1   Labs 09/10/2018: HB 15.8/HCT 46.3, mild macrocytosis present with MCV of 100.  Platelet count 154.  Potassium 4.9, BUN 24, creatinine 1.1, eGFR greater than 60 mL.  ALT and AST elevated at 64 and 57 respectively.  Total cholesterol 184, triglycerides 199, HDL 85, LDL 59.  Labs 07/12/2017: HB 15.6, HCT 47.8, platelets 155.  Normal indicis.  Potassium 4.8, serum glucose 160 mg, BUN 23, creatinine 1.1.  CMP normal.  Total cholesterol 232, triglycerides 138, HDL 62, LDL 07/10/40.  Non-HDL cholesterol 170.  Uric acid is elevated at 7.8, vitamin D 25 x 1.  CARDIAC STUDIES:   Direct current cardioversion 01/17/2019:  Indication symptomatic A. Fibrillation.  Procedure: Using 50 mg of IV Propofol and 40 IV Lidocaine (for reducing venous pain) for achieving deep sedation, synchronized direct current cardioversion performed. Patient was delivered with 150 Joules of electricity X 1 with success to NSR. Patient tolerated the procedure well. No immediate  complication noted.   Echocardiogram 02/11/2019: Left ventricle cavity is mildly dilated. Mild concentric  hypertrophy of the left ventricle. Normal global wall motion. Normal LV systolic function with EF 54%. Normal diastolic filling pattern.  Left atrial cavity is mildly dilated. Mild (Grade I) mitral regurgitation. Mild pulmonic regurgitation. Compared to previous study on 09/23/2018, there is significant improvement in LVEF and severity of mitral regurgitation.   Lexiscan myoview stress test 09/20/2018:  1. Lexiscan stress test was performed. Exercise capacity was not assessed. Stress symptoms included dizziness. Resting blood pressure was 164/108 mmHg and peak effect blood pressure was 174/116 mmHg. The resting and stress electrocardiogram demonstrated atrial fibrillation with rapid ventricular rate, occasional PVC, and normal rest repolarization.  Stress EKG is non diagnostic for ischemia as it is a pharmacologic stress. In addition, no ischemic changes seen at heart rate of 136 bpm.  2. The overall quality of the study is good.  Left ventricular cavity is noted to be enlarged on the rest and stress studies.  Gated SPECT images reveal moderate global decrease in myocardial thickening and wall motion.  The left ventricular ejection fraction was calculated or visually estimated to be 34%.  REST and STRESS images demonstrate mildly decreased tracer uptake in the basal inferior, mid inferior and apical inferior segments of the left ventricle, with no reversibility. Findings suggestive of dilated nonishcemic cardiomyopathy.  3. High risk study due to reduced LVED. Recommend clinical correlation.   Assessment & Recommendations:     ICD-10-CM   1. Paroxysmal atrial fibrillation (HCC)  I48.0 EKG 12-Lead  2. Essential hypertension  I10   3. Obstructive sleep apnea  G47.33   4. Alcohol abuse  F10.10      EKG 01/01/2019: Atrial flutter, probably typical with 2: 1 AV conduction at the rate of 140 bpm.  Left axis deviation.  Left anterior fascicular block.  IVCD, LVH.  Prolonged QT interval at 548 ms.  EKG  12/18/2018: Atrial flutter with 2:1 conduction, atypical atrial flutter.  Left axis deviation, left anterior fascicular block.  Incomplete right bundle branch block.  Nonspecific T abnormality. No significant change from  EKG 12/17/2018   EKG 10/17/2018: Normal sinus rhythm at the rate of 74 bpm, left axis deviation, left anterior fascicular block.  IVCD, LVH.  Single pair of Ventricular couplets.  Normal QT interval.  Nonspecific T abnormality.  EKG 09/11/2018: Atrial fibrillation with rapid ventricular response at the rate of 136 bpm, left axis deviation, left anterior fascicular block.  Incomplete right bundle branch block.  Nonspecific T abnormality.  Recommendation:   Since cardioversion, patient is feeling much better and has had overall improvement in symptoms. He is maintaining sinus rhythm. I have discussed recently obtained echocardiogram, with normal rhythm, LV systolic dysfunction has resolved. LVEF now 54%. He is noted to be bradycardic, which is asymptomatic; however, in view of borderline QT prolongation, will decrease dose of Metoprolol down to 25 mg BID.   I have congratulated him on cutting back on his alcohol intake and urged him to continue with this. He will continue to need risk factor modification. He will work on weight loss with diet and exercise. We will see him back in 3 months for follow up.   *I have discussed this case with Dr. Einar Gip and he personally examined the patient and participated in formulating the plan.*   Miquel Dunn, MSN, APRN, FNP-C [facility]. [place] Office: [phone] Fax: [phone]

## 2019-02-25 ENCOUNTER — Other Ambulatory Visit: Payer: Self-pay | Admitting: Cardiology

## 2019-02-25 DIAGNOSIS — I480 Paroxysmal atrial fibrillation: Secondary | ICD-10-CM

## 2019-02-25 NOTE — Telephone Encounter (Signed)
Please fill

## 2019-03-12 ENCOUNTER — Encounter: Payer: Self-pay | Admitting: Pulmonary Disease

## 2019-03-12 ENCOUNTER — Other Ambulatory Visit: Payer: Self-pay

## 2019-03-12 ENCOUNTER — Other Ambulatory Visit: Payer: Self-pay | Admitting: Cardiology

## 2019-03-12 ENCOUNTER — Ambulatory Visit (INDEPENDENT_AMBULATORY_CARE_PROVIDER_SITE_OTHER): Payer: Medicare Other | Admitting: Pulmonary Disease

## 2019-03-12 VITALS — BP 126/78 | HR 61 | Temp 98.3°F | Ht 71.0 in | Wt 245.8 lb

## 2019-03-12 DIAGNOSIS — I480 Paroxysmal atrial fibrillation: Secondary | ICD-10-CM

## 2019-03-12 DIAGNOSIS — G4733 Obstructive sleep apnea (adult) (pediatric): Secondary | ICD-10-CM

## 2019-03-12 MED ORDER — METOPROLOL TARTRATE 25 MG PO TABS: 25.0000 mg | ORAL_TABLET | Freq: Two times a day (BID) | ORAL | 3 refills | Status: DC

## 2019-03-12 NOTE — Progress Notes (Signed)
Darren [name]    [id number]    13-Jan-1941  Primary Care Physician:Moreira, Carloyn Manner, MD  Referring Physician: [name], MD [id number] [place],  [place] [address]  Chief complaint:   Patient being seen for obstructive sleep apnea  HPI:  Diagnosed with obstructive sleep apnea about 7 8 years ago Has been using CPAP Compliant with CPAP use  He is on a pressure setting of 13 Feels his machine is getting dated  Usually goes to bed between 9 and 10 PM Gets about 8 to 9 hours of sleep Wakes up a couple of times during the night  Usually cannot sleep without his CPAP  He has hypertension, atrial fibrillation  Has a history of prostate cancer, history of asthma, prediabetes   Outpatient Encounter Medications as of 03/12/2019  Medication Sig  . allopurinol (ZYLOPRIM) 100 MG tablet Take 100 mg by mouth daily.   [name] ammonium lactate (AMLACTIN) 12 % cream Apply 1 g topically as needed for dry skin (scaly feet).  [name] apixaban (ELIQUIS) 5 MG TABS tablet Take 1 tablet (5 mg total) by mouth 2 (two) times daily.  [name] atorvastatin (LIPITOR) 40 MG tablet Take 40 mg by mouth daily.   . Cholecalciferol (VITAMIN D-3 PO) Take 2,000 Units by mouth daily.   . colchicine 0.6 MG tablet Take 0.6 mg by mouth daily as needed (gout).   . colesevelam (WELCHOL) 625 MG tablet Take 1,875 mg by mouth 2 (two) times daily with a meal.   . hydrALAZINE (APRESOLINE) 25 MG tablet Take 1 tablet (25 mg total) by mouth 3 (three) times daily.  [name] L-Methylfolate-Algae-B12-B6 (METANX) 3-90.314-2-35 MG CAPS Take 1 tablet by mouth 2 (two) times daily.   . Magnesium 400 MG TABS Take 400 mg by mouth daily at 12 noon.   . metoprolol tartrate (LOPRESSOR) 25 MG tablet Take 1 tablet (25 mg total) by mouth 2 (two) times daily.  . mometasone-formoterol (DULERA) 100-5 MCG/ACT AERO Inhale 2 puffs into the lungs 2 (two) times daily.  . montelukast (SINGULAIR) 10 MG tablet TAKE 1 TABLET BY MOUTH AT BEDTIME  . omeprazole  (PRILOSEC) 20 MG capsule Take 20 mg by mouth 2 (two) times daily before a meal.  . Polyethyl Glycol-Propyl Glycol (SYSTANE OP) Place 1 drop into both eyes 3 (three) times daily as needed (dry eyes).  . sotalol (BETAPACE) 80 MG tablet TAKE 1 TABLET BY MOUTH 2 TIMES DAILY.  [name] telmisartan (MICARDIS) 80 MG tablet Take 80 mg by mouth daily.  [name] thiamine (VITAMIN B-1) 100 MG tablet Take 100 mg by mouth daily.  [name] spironolactone (ALDACTONE) 25 MG tablet Take 1 tablet (25 mg total) by mouth daily for 30 days.   No facility-administered encounter medications on file as of 03/12/2019.     Allergies as of 03/12/2019 - Review Complete 03/12/2019  Allergen Reaction Noted  . Amlodipine  10/17/2018  . Penicillins Hives 03/30/2016    Past Medical History:  Diagnosis Date  . Arthritis    "maybe a little"  . Asthma    "mild"  . Atrial fibrillation ([place])   . bilateral leg edema 09/11/2018  . Cancer [facility])    prostate cancer  . Dyspnea   . Dyspnea on exertion 09/11/2018  . Dysrhythmia    Afib with rapid ventricular  . Essential hypertension 07/25/2018  . Fatty liver   . GERD (gastroesophageal reflux disease)   . GI bleed 2018  . Gout   . History  of blood transfusion 2018  . Obstructive sleep apnea 09/11/2018  . OSA (obstructive sleep apnea)   . Paroxysmal atrial fibrillation ([place]) 09/11/2018  . Pre-diabetes     Past Surgical History:  Procedure Laterality Date  . CARDIOVERSION N/A 10/08/2018   Procedure: CARDIOVERSION;  Surgeon: [name], MD;  Location: [facility] ENDOSCOPY;  Service: Cardiovascular;  Laterality: N/A;  . CARDIOVERSION N/A 01/17/2019   Procedure: CARDIOVERSION;  Surgeon: [name], MD;  Location: [place];  Service: Cardiovascular;  Laterality: N/A;  . COLONOSCOPY Left 03/10/2017   Procedure: COLONOSCOPY;  Surgeon: Arta Silence, MD;  Location: [facility] ENDOSCOPY;  Service: Endoscopy;  Laterality: Left;  . ESOPHAGOGASTRODUODENOSCOPY (EGD) WITH PROPOFOL Left 03/09/2017   Procedure:  ESOPHAGOGASTRODUODENOSCOPY (EGD) WITH PROPOFOL;  Surgeon: Ronnette Juniper, MD;  Location: [place];  Service: Gastroenterology;  Laterality: Left;  . EYE SURGERY Bilateral    cataract   . FINGER SURGERY Right    5th  . Prostate     DaVinci    Family History  Problem Relation Age of Onset  . Emphysema Father        smoked    Social History   Socioeconomic History  . Marital status: Married    Spouse name: Not on file  . Number of children: 4  . Years of education: Not on file  . Highest education level: Not on file  Occupational History  . Not on file  Social Needs  . Financial resource strain: Not on file  . Food insecurity    Worry: Not on file    Inability: Not on file  . Transportation needs    Medical: Not on file    Non-medical: Not on file  Tobacco Use  . Smoking status: Former Smoker    Packs/day: 1.00    Years: 25.00    Pack years: 25.00    Quit date: 07/10/1988    Years since quitting: 30.6  . Smokeless tobacco: Never Used  Substance and Sexual Activity  . Alcohol use: Yes    Alcohol/week: 25.0 standard drinks    Types: 25 Shots of liquor per week  . Drug use: No  . Sexual activity: Not on file  Lifestyle  . Physical activity    Days per week: Not on file    Minutes per session: Not on file  . Stress: Not on file  Relationships  . Social Herbalist on phone: Not on file    Gets together: Not on file    Attends religious service: Not on file    Active member of club or organization: Not on file    Attends meetings of clubs or organizations: Not on file    Relationship status: Not on file  . Intimate partner violence    Fear of current or ex partner: Not on file    Emotionally abused: Not on file    Physically abused: Not on file    Forced sexual activity: Not on file  Other Topics Concern  . Not on file  Social History Narrative  . Not on file    Review of Systems  HENT: Negative.   Eyes: Negative.   Respiratory: Positive for  cough and shortness of breath.   Cardiovascular: Positive for leg swelling.  Gastrointestinal: Negative.   Endocrine: Negative.   Genitourinary: Negative.   All other systems reviewed and are negative.   Vitals:   03/12/19 1406  BP: 126/78  Pulse: 61  Temp: 98.3 F (36.8 C)  SpO2: 95%  Physical Exam  Constitutional: He appears well-developed and well-nourished.  Eyes: Conjunctivae are normal. Right eye exhibits no discharge. Left eye exhibits no discharge.  Neck: Normal range of motion. Neck supple. No tracheal deviation present. No thyromegaly present.  Cardiovascular: Normal rate.  Pulmonary/Chest: Effort normal. No respiratory distress. He has no wheezes. He has no rales.  Decreased air entry at the bases bilaterally  Abdominal: Soft. Bowel sounds are normal. There is no abdominal tenderness. There is no rebound.   Data Reviewed: His smart card is not available to be reviewed today  Assessment:  History of obstructive sleep apnea Compliant with CPAP use CPAP setting of 13  Requires a new machine  Morbid obesity  Paroxysmal atrial fibrillation  Plan/Recommendations: Pathophysiology of sleep disordered breathing discussed with Shirlee Limerick  We will set the patient up for a home sleep study  Auto titrating CPAP will be our option of treatment  We will see him back in the office in about 2 to 3 months  Call with any significant concerns   Sherrilyn Rist MD [facility] 03/12/2019, 2:31 PM  CC: [name], MD

## 2019-03-12 NOTE — Patient Instructions (Signed)
History of obstructive sleep apnea with CPAP device  We will set you up for home sleep study Set you up with an auto titrating machine following confirmation of the diagnosis  I will see you back in the office in about 3 months  Call with any significant concerns

## 2019-03-19 ENCOUNTER — Other Ambulatory Visit: Payer: Self-pay | Admitting: Physician Assistant

## 2019-03-25 ENCOUNTER — Ambulatory Visit: Payer: Medicare Other

## 2019-03-25 ENCOUNTER — Other Ambulatory Visit: Payer: Self-pay

## 2019-03-25 DIAGNOSIS — G4733 Obstructive sleep apnea (adult) (pediatric): Secondary | ICD-10-CM | POA: Diagnosis not present

## 2019-03-27 ENCOUNTER — Telehealth: Payer: Self-pay | Admitting: Pulmonary Disease

## 2019-03-27 DIAGNOSIS — G4733 Obstructive sleep apnea (adult) (pediatric): Secondary | ICD-10-CM | POA: Diagnosis not present

## 2019-03-27 NOTE — Telephone Encounter (Signed)
HST was reviewed by AO. HST showed patient has mild OSA with moderate oxygen desaturations. He recommends an auto titrating cpap with pressure setting of 5-18cm.   Spoke with patient, he was made aware of the results. Verbalized understanding. Will go ahead and place the order for the CPAP.   Nothing further needed at time of call.

## 2019-03-28 ENCOUNTER — Other Ambulatory Visit: Payer: Self-pay | Admitting: Cardiology

## 2019-03-28 DIAGNOSIS — I480 Paroxysmal atrial fibrillation: Secondary | ICD-10-CM

## 2019-04-16 ENCOUNTER — Other Ambulatory Visit: Payer: Self-pay | Admitting: Cardiology

## 2019-04-16 DIAGNOSIS — I5042 Chronic combined systolic (congestive) and diastolic (congestive) heart failure: Secondary | ICD-10-CM

## 2019-04-24 ENCOUNTER — Other Ambulatory Visit: Payer: Self-pay | Admitting: Cardiology

## 2019-04-24 DIAGNOSIS — I480 Paroxysmal atrial fibrillation: Secondary | ICD-10-CM

## 2019-04-24 NOTE — Telephone Encounter (Signed)
Please fill

## 2019-05-01 ENCOUNTER — Other Ambulatory Visit: Payer: Self-pay

## 2019-05-01 ENCOUNTER — Ambulatory Visit (INDEPENDENT_AMBULATORY_CARE_PROVIDER_SITE_OTHER): Payer: Medicare Other | Admitting: Cardiology

## 2019-05-01 ENCOUNTER — Encounter: Payer: Self-pay | Admitting: Cardiology

## 2019-05-01 VITALS — BP 134/70 | HR 50 | Temp 95.9°F | Ht 71.0 in | Wt 251.3 lb

## 2019-05-01 DIAGNOSIS — G4733 Obstructive sleep apnea (adult) (pediatric): Secondary | ICD-10-CM

## 2019-05-01 DIAGNOSIS — I480 Paroxysmal atrial fibrillation: Secondary | ICD-10-CM | POA: Diagnosis not present

## 2019-05-01 DIAGNOSIS — I10 Essential (primary) hypertension: Secondary | ICD-10-CM | POA: Diagnosis not present

## 2019-05-01 NOTE — Progress Notes (Signed)
Subjective:  Primary Physician:  [name], MD  Patient ID: Darren [name], male    DOB: Oct 30, 1940, 78 y.o.   MRN: [id number]  Chief Complaint  Patient presents with  . Atrial Fibrillation  . Hypertension  . Follow-up    1 year    HPI: Darren [name]  is a 78 y.o. male  with moderate obesity paroxysmal atrial fibrillation and probably sinus node dysfunction with  sinus bradycardia even on minimal dose of beta-blocker, OSA on CPAP, hyperlipidemia, bronchial asthma and history of excessive alcohol intake presents for F/U OF ATRIAL FIBRILLATION. [name]  He was started on sotalol on 12/18/2018 which he is tolerating.  He has had multiple direct current cardioversion.  His LVEF was severely depressed and off.  Underwent cardioversion, started on sotalol which is tolerating.  Now presents for follow-up.  States that his reduced his alcohol I  Dyspnea has improved.  He has not had any further palpitation  No fatigue.    Past Surgical History:  Procedure Laterality Date  . CARDIOVERSION N/A 10/08/2018   Procedure: CARDIOVERSION;  Surgeon: [name], MD;  Location: [facility] ENDOSCOPY;  Service: Cardiovascular;  Laterality: N/A;  . CARDIOVERSION N/A 01/17/2019   Procedure: CARDIOVERSION;  Surgeon: [name], MD;  Location: [place];  Service: Cardiovascular;  Laterality: N/A;  . COLONOSCOPY Left 03/10/2017   Procedure: COLONOSCOPY;  Surgeon: Arta Silence, MD;  Location[facility] ENDOSCOPY;  Service: Endoscopy;  Laterality: Left;  . ESOPHAGOGASTRODUODENOSCOPY (EGD) WITH PROPOFOL Left 03/09/2017   Procedure: ESOPHAGOGASTRODUODENOSCOPY (EGD) WITH PROPOFOL;  Surgeon: Ronnette Juniper, MD;  Location: [place];  Service: Gastroenterology;  Laterality: Left;  . EYE SURGERY Bilateral    cataract   . FINGER SURGERY Right    5th  . Prostate     DaVinci    Social History   Socioeconomic History  . Marital status: Married    Spouse name: Not on file  . Number of children: 4  . Years of education: Not  on file  . Highest education level: Not on file  Occupational History  . Not on file  Social Needs  . Financial resource strain: Not on file  . Food insecurity    Worry: Not on file    Inability: Not on file  . Transportation needs    Medical: Not on file    Non-medical: Not on file  Tobacco Use  . Smoking status: Former Smoker    Packs/day: 1.00    Years: 25.00    Pack years: 25.00    Quit date: 07/10/1988    Years since quitting: 30.8  . Smokeless tobacco: Never Used  Substance and Sexual Activity  . Alcohol use: Yes    Alcohol/week: 25.0 standard drinks    Types: 25 Shots of liquor per week  . Drug use: No  . Sexual activity: Not on file  Lifestyle  . Physical activity    Days per week: Not on file    Minutes per session: Not on file  . Stress: Not on file  Relationships  . Social Herbalist on phone: Not on file    Gets together: Not on file    Attends religious service: Not on file    Active member of club or organization: Not on file    Attends meetings of clubs or organizations: Not on file    Relationship status: Not on file  . Intimate partner violence    Fear of current or ex partner: Not  on file    Emotionally abused: Not on file    Physically abused: Not on file    Forced sexual activity: Not on file  Other Topics Concern  . Not on file  Social History Narrative  . Not on file    Current Outpatient Medications on File Prior to Visit  Medication Sig Dispense Refill  . allopurinol (ZYLOPRIM) 100 MG tablet Take 100 mg by mouth daily.     [name] ammonium lactate (AMLACTIN) 12 % cream Apply 1 g topically as needed for dry skin (scaly feet).    [name] atorvastatin (LIPITOR) 40 MG tablet Take 40 mg by mouth daily.     . Cholecalciferol (VITAMIN D-3 PO) Take 2,000 Units by mouth daily.     . colchicine 0.6 MG tablet Take 0.6 mg by mouth daily as needed (gout).     . colesevelam (WELCHOL) 625 MG tablet Take 1,875 mg by mouth 2 (two) times daily with a meal.      . ELIQUIS 5 MG TABS tablet TAKE 1 TABLET TWICE A DAY 180 tablet 3  . hydrALAZINE (APRESOLINE) 25 MG tablet TAKE 1 TABLET BY MOUTH 3 TIMES DAILY. 90 tablet 3  . L-Methylfolate-Algae-B12-B6 (METANX) 3-90.314-2-35 MG CAPS Take 1 tablet by mouth 2 (two) times daily.     . Magnesium 400 MG TABS Take 400 mg by mouth daily at 12 noon.     . metoprolol tartrate (LOPRESSOR) 25 MG tablet Take 1 tablet (25 mg total) by mouth 2 (two) times daily. 60 tablet 3  . mometasone-formoterol (DULERA) 100-5 MCG/ACT AERO Inhale 2 puffs into the lungs 2 (two) times daily. 3 Inhaler 3  . montelukast (SINGULAIR) 10 MG tablet TAKE 1 TABLET BY MOUTH AT BEDTIME 30 tablet 11  . omeprazole (PRILOSEC) 20 MG capsule Take 20 mg by mouth 2 (two) times daily before a meal.    . Polyethyl Glycol-Propyl Glycol (SYSTANE OP) Place 1 drop into both eyes 3 (three) times daily as needed (dry eyes).    . sotalol (BETAPACE) 80 MG tablet TAKE 1 TABLET BY MOUTH 2 TIMES DAILY. (Patient taking differently: Take 40 mg by mouth 2 (two) times daily. ) 60 tablet 6  . spironolactone (ALDACTONE) 25 MG tablet Take 1 tablet (25 mg total) by mouth daily for 30 days. 90 tablet 1  . telmisartan (MICARDIS) 80 MG tablet Take 80 mg by mouth daily.    [name] thiamine (VITAMIN B-1) 100 MG tablet Take 100 mg by mouth daily.     No current facility-administered medications on file prior to visit.    Review of Systems  Constitutional: Negative for malaise/fatigue and weight loss.  Respiratory: Positive for shortness of breath (improving). Negative for cough and hemoptysis.   Cardiovascular: Negative for chest pain, palpitations, claudication and leg swelling.  Gastrointestinal: Negative for abdominal pain, blood in stool, constipation, heartburn and vomiting.  Genitourinary: Negative for dysuria.  Musculoskeletal: Positive for joint pain. Negative for myalgias.  Neurological: Negative for dizziness, focal weakness and headaches.  Endo/Heme/Allergies: Does not  bruise/bleed easily.  Psychiatric/Behavioral: Negative for depression. The patient is not nervous/anxious.   All other systems reviewed and are negative.  Blood pressure (!) 126/93, pulse (!) 120, temperature (!) 97 F (36.1 C), height 5' 11"  (1.803 m), weight 246 lb (111.6 kg), SpO2 97 %. Body mass index is 35.05 kg/m.  Physical Exam  Constitutional: He appears well-developed. No distress.  HENT:  Head: Atraumatic.  Eyes: Conjunctivae are normal.  Neck: Neck supple. No JVD  present. No thyromegaly present.  Cardiovascular: Regular rhythm and normal heart sounds. Tachycardia present. Exam reveals no gallop.  No murmur heard. Pulses:      Carotid pulses are 2+ on the right side and 2+ on the left side.      Dorsalis pedis pulses are 2+ on the right side and 2+ on the left side.       Posterior tibial pulses are 2+ on the right side and 2+ on the left side.  Femoral and popliteal pulse difficult to feel due to patient's body habitus.  1-2+ bilateral leg edema.  Pulmonary/Chest: Effort normal and breath sounds normal.  Abdominal: Soft. Bowel sounds are normal.  Musculoskeletal: Normal range of motion.  Neurological: He is alert.  Skin: Skin is warm and dry.  Psychiatric: He has a normal mood and affect.   Laboratory examination  BMP Latest Ref Rng & Units 01/17/2019 01/13/2019 12/03/2018  Glucose 70 - 99 mg/dL 97 96 132(H)  BUN 8 - 27 mg/dL - 32(H) 33(H)  Creatinine 0.76 - 1.27 mg/dL - 1.41(H) 1.24  BUN/Creat Ratio 10 - 24 - 23 27(H)  Sodium 135 - 145 mmol/L 134(L) 135 144  Potassium 3.5 - 5.1 mmol/L 5.1 6.5(H) 5.0  Chloride 96 - 106 mmol/L - 99 106  CO2 20 - 29 mmol/L - 22 18(L)  Calcium 8.6 - 10.2 mg/dL - 9.6 9.1   Labs 09/10/2018: HB 15.8/HCT 46.3, mild macrocytosis present with MCV of 100.  Platelet count 154.  Potassium 4.9, BUN 24, creatinine 1.1, eGFR greater than 60 mL.  ALT and AST elevated at 64 and 57 respectively.  Total cholesterol 184, triglycerides 199, HDL 85, LDL 59.   Labs 07/12/2017: HB 15.6, HCT 47.8, platelets 155.  Normal indicis.  Potassium 4.8, serum glucose 160 mg, BUN 23, creatinine 1.1.  CMP normal.  Total cholesterol 232, triglycerides 138, HDL 62, LDL 07/10/40.  Non-HDL cholesterol 170.  Uric acid is elevated at 7.8, vitamin D 25 x 1.  CARDIAC STUDIES:   Echocardiogram 09/23/2018 :  Poor echo window. Wall motion abnormality has reduced sensitivity. Left ventricle cavity is normal in size. Moderate concentric hypertrophy of the left ventricle. Abnormal septal wall motion due to left bundle branch block. Cannot exclude anterio septal hypokinesis.  Normal diastolic filling pattern. Visual EF is 30-35%. Left atrial cavity is moderate to severely dilated at 4.7 cm. Moderate (Grade II) mitral regurgitation. Trace tricuspid regurgitation. Unable to estimate PA pressure due to absence/minimal TR signal.  Lexiscan myoview stress test 09/20/2018:  1. Lexiscan stress test was performed. Exercise capacity was not assessed. Stress symptoms included dizziness. Resting blood pressure was 164/108 mmHg and peak effect blood pressure was 174/116 mmHg. The resting and stress electrocardiogram demonstrated atrial fibrillation with rapid ventricular rate, occasional PVC, and normal rest repolarization.  Stress EKG is non diagnostic for ischemia as it is a pharmacologic stress. In addition, no ischemic changes seen at heart rate of 136 bpm.  2. The overall quality of the study is good.  Left ventricular cavity is noted to be enlarged on the rest and stress studies.  Gated SPECT images reveal moderate global decrease in myocardial thickening and wall motion.  The left ventricular ejection fraction was calculated or visually estimated to be 34%.  REST and STRESS images demonstrate mildly decreased tracer uptake in the basal inferior, mid inferior and apical inferior segments of the left ventricle, with no reversibility. Findings suggestive of dilated nonishcemic cardiomyopathy.   3. High risk study due to  reduced LVED. Recommend clinical correlation.   Assessment & Recommendations:     ICD-10-CM   1. Paroxysmal atrial fibrillation (HCC)  I48.0 EKG 12-Lead   CHA2DS2-VASc Score is 4.  Yearly risk of stroke: 4%(A, HTN, CHF)    2. Obstructive sleep apnea  G47.33   3. Essential hypertension  I10     EKG 05/01/2019: Normal sinus rhythm at rate of 63 bpm, left axis deviation, left anterior fascicular block.  Incomplete right bundle branch block.  Normal QT interval.  PVC (2).  Recommendation:  He is presently doing well and tolerating sotalol and maintaining sinus rhythm, SOTALOL dose was reduced to 40 mg b.i.d. due to prolonged QT interval.  Weight loss was discussed extensively.  Continue anticoagulation.  He was referred for sleep evaluation, now has a new sleep apnea machine settings and states that he is to be much better.  Blood pressure is also well controlled.  No bleeding This on anticoagulation.  I will see him back in 6 months.   [name], MD, [facility] 05/01/2019, 11:28 PM [facility] [place] Pager: [phone] Office: [phone] If no answer Cell [phone]

## 2019-05-16 ENCOUNTER — Ambulatory Visit: Payer: Medicare Other | Admitting: Cardiology

## 2019-06-10 ENCOUNTER — Telehealth: Payer: Self-pay | Admitting: Pulmonary Disease

## 2019-06-10 NOTE — Telephone Encounter (Signed)
The missed call was to do Covid screen. I have called patient back to do Covid screen. Nothing further is needed.

## 2019-06-11 ENCOUNTER — Encounter: Payer: Self-pay | Admitting: Pulmonary Disease

## 2019-06-11 ENCOUNTER — Ambulatory Visit (INDEPENDENT_AMBULATORY_CARE_PROVIDER_SITE_OTHER): Payer: Medicare Other | Admitting: Pulmonary Disease

## 2019-06-11 ENCOUNTER — Other Ambulatory Visit: Payer: Self-pay

## 2019-06-11 VITALS — BP 136/68 | HR 60 | Ht 71.0 in | Wt 254.8 lb

## 2019-06-11 DIAGNOSIS — G4733 Obstructive sleep apnea (adult) (pediatric): Secondary | ICD-10-CM | POA: Diagnosis not present

## 2019-06-11 DIAGNOSIS — Z9989 Dependence on other enabling machines and devices: Secondary | ICD-10-CM

## 2019-06-11 NOTE — Patient Instructions (Signed)
Obstructive sleep apnea  Excellent compliance with CPAP Doing well, optimal treatment with CPAP  I will see him back in about 6 months  Encouraged to call with any significant concerns

## 2019-06-11 NOTE — Progress Notes (Signed)
Darren [name]    [id number]    [birth year]  Primary Care Physician:Moreira, Carloyn Manner, MD  Referring Physician: [name], MD [id number] [place],   [id number]  Chief complaint:   Patient being seen for obstructive sleep apnea Doing well with CPAP  HPI:  Recently started on auto titrating CPAP Feels well Sleeping well, functioning well  Diagnosed with obstructive sleep apnea about 7 -8 years ago Has been using CPAP Compliant with CPAP use  It was at a pressure of 13 previously, now using an auto titrating CPAP  Usually goes to bed between 9 and 10 PM Gets about 8 to 9 hours of sleep Wakes up a couple of times during the night  Usually cannot sleep without his CPAP  He has hypertension, atrial fibrillation  Has a history of prostate cancer, history of asthma, prediabetes   Outpatient Encounter Medications as of 06/11/2019  Medication Sig  . allopurinol (ZYLOPRIM) 100 MG tablet Take 100 mg by mouth daily.   [name] ammonium lactate (AMLACTIN) 12 % cream Apply 1 g topically as needed for dry skin (scaly feet).  [name] atorvastatin (LIPITOR) 40 MG tablet Take 40 mg by mouth daily.   . Cholecalciferol (VITAMIN D-3 PO) Take 2,000 Units by mouth daily.   . colchicine 0.6 MG tablet Take 0.6 mg by mouth daily as needed (gout).   . colesevelam (WELCHOL) 625 MG tablet Take 1,875 mg by mouth 2 (two) times daily with a meal.   . ELIQUIS 5 MG TABS tablet TAKE 1 TABLET TWICE A DAY  . hydrALAZINE (APRESOLINE) 25 MG tablet TAKE 1 TABLET BY MOUTH 3 TIMES DAILY.  [name] L-Methylfolate-Algae-B12-B6 (METANX) 3-90.314-2-35 MG CAPS Take 1 tablet by mouth 2 (two) times daily.   . Magnesium 400 MG TABS Take 400 mg by mouth daily at 12 noon.   . mometasone-formoterol (DULERA) 100-5 MCG/ACT AERO Inhale 2 puffs into the lungs 2 (two) times daily.  . montelukast (SINGULAIR) 10 MG tablet TAKE 1 TABLET BY MOUTH AT BEDTIME  . omeprazole (PRILOSEC) 20 MG capsule Take 20 mg by mouth 2 (two) times  daily before a meal.  . Polyethyl Glycol-Propyl Glycol (SYSTANE OP) Place 1 drop into both eyes 3 (three) times daily as needed (dry eyes).  . sotalol (BETAPACE) 80 MG tablet TAKE 1 TABLET BY MOUTH 2 TIMES DAILY. (Patient taking differently: Take 40 mg by mouth 2 (two) times daily. )  . telmisartan (MICARDIS) 80 MG tablet Take 80 mg by mouth daily.  [name] thiamine (VITAMIN B-1) 100 MG tablet Take 100 mg by mouth daily.  . metoprolol tartrate (LOPRESSOR) 25 MG tablet Take 1 tablet (25 mg total) by mouth 2 (two) times daily.  [name] spironolactone (ALDACTONE) 25 MG tablet Take 1 tablet (25 mg total) by mouth daily for 30 days.   No facility-administered encounter medications on file as of 06/11/2019.     Allergies as of 06/11/2019 - Review Complete 06/11/2019  Allergen Reaction Noted  . Amlodipine  10/17/2018  . Penicillins Hives 03/30/2016    Past Medical History:  Diagnosis Date  . Arthritis    "maybe a little"  . Asthma    "mild"  . Atrial fibrillation ([place])   . bilateral leg edema 09/11/2018  . Cancer [facility])    prostate cancer  . Dyspnea   . Dyspnea on exertion 09/11/2018  . Dysrhythmia    Afib with rapid ventricular  . Essential hypertension 07/25/2018  . Fatty liver   .  GERD (gastroesophageal reflux disease)   . GI bleed 2018  . Gout   . History of blood transfusion 2018  . Obstructive sleep apnea 09/11/2018  . OSA (obstructive sleep apnea)   . Paroxysmal atrial fibrillation ([place]) 09/11/2018  . Pre-diabetes     Past Surgical History:  Procedure Laterality Date  . CARDIOVERSION N/A 10/08/2018   Procedure: CARDIOVERSION;  Surgeon: [name], MD;  Location: [facility] ENDOSCOPY;  Service: Cardiovascular;  Laterality: N/A;  . CARDIOVERSION N/A 01/17/2019   Procedure: CARDIOVERSION;  Surgeon: [name], MD;  Location: [place];  Service: Cardiovascular;  Laterality: N/A;  . COLONOSCOPY Left 03/10/2017   Procedure: COLONOSCOPY;  Surgeon: Arta Silence, MD;  Location: [facility] ENDOSCOPY;  Service:  Endoscopy;  Laterality: Left;  . ESOPHAGOGASTRODUODENOSCOPY (EGD) WITH PROPOFOL Left 03/09/2017   Procedure: ESOPHAGOGASTRODUODENOSCOPY (EGD) WITH PROPOFOL;  Surgeon: Ronnette Juniper, MD;  Location: [place];  Service: Gastroenterology;  Laterality: Left;  . EYE SURGERY Bilateral    cataract   . FINGER SURGERY Right    5th  . Prostate     DaVinci    Family History  Problem Relation Age of Onset  . Emphysema Father        smoked    Social History   Socioeconomic History  . Marital status: Married    Spouse name: Not on file  . Number of children: 4  . Years of education: Not on file  . Highest education level: Not on file  Occupational History  . Not on file  Social Needs  . Financial resource strain: Not on file  . Food insecurity    Worry: Not on file    Inability: Not on file  . Transportation needs    Medical: Not on file    Non-medical: Not on file  Tobacco Use  . Smoking status: Former Smoker    Packs/day: 1.00    Years: 25.00    Pack years: 25.00    Quit date: 07/10/1988    Years since quitting: 30.9  . Smokeless tobacco: Never Used  Substance and Sexual Activity  . Alcohol use: Yes    Alcohol/week: 25.0 standard drinks    Types: 25 Shots of liquor per week  . Drug use: No  . Sexual activity: Not on file  Lifestyle  . Physical activity    Days per week: Not on file    Minutes per session: Not on file  . Stress: Not on file  Relationships  . Social Herbalist on phone: Not on file    Gets together: Not on file    Attends religious service: Not on file    Active member of club or organization: Not on file    Attends meetings of clubs or organizations: Not on file    Relationship status: Not on file  . Intimate partner violence    Fear of current or ex partner: Not on file    Emotionally abused: Not on file    Physically abused: Not on file    Forced sexual activity: Not on file  Other Topics Concern  . Not on file  Social History  Narrative  . Not on file    Review of Systems  HENT: Negative.   Eyes: Negative.   Respiratory: Positive for apnea, cough and shortness of breath.   Cardiovascular: Positive for leg swelling.  Gastrointestinal: Negative.   Endocrine: Negative.   Genitourinary: Negative.   Psychiatric/Behavioral: Positive for sleep disturbance.  All other systems reviewed and  are negative.   Vitals:   06/11/19 1040  BP: 136/68  Pulse: 60  SpO2: 96%     Physical Exam  Constitutional: He appears well-developed and well-nourished.  HENT:  Head: Normocephalic and atraumatic.  Eyes: Conjunctivae are normal. Right eye exhibits no discharge. Left eye exhibits no discharge.  Neck: Normal range of motion. Neck supple. No tracheal deviation present. No thyromegaly present.  Cardiovascular: Normal rate.  Pulmonary/Chest: Effort normal. No respiratory distress. He has no wheezes. He has no rales.  Decreased air entry at the bases bilaterally  Abdominal: Soft. Bowel sounds are normal. There is no abdominal tenderness. There is no rebound.   Data Reviewed: Compliance data reveals 100% compliance Residual AHI 1.3  Assessment:  History of obstructive sleep apnea -Compliant with CPAP use -Currently on auto titrating CPAP  Morbid obesity -Continues to work on weight loss efforts  Paroxysmal atrial fibrillation  Plan/Recommendations: Continue CPAP use  Reviewed download data with the patient  Auto titrating CPAP seems to be working very well for him at present  I will see him back in the office in about next months  Call with any significant concerns   Sherrilyn Rist MD [facility] 06/11/2019, 10:53 AM  CC: [name], MD

## 2019-06-23 ENCOUNTER — Other Ambulatory Visit: Payer: Self-pay | Admitting: Cardiology

## 2019-06-23 DIAGNOSIS — I10 Essential (primary) hypertension: Secondary | ICD-10-CM

## 2019-08-14 ENCOUNTER — Other Ambulatory Visit: Payer: Self-pay | Admitting: Cardiology

## 2019-08-14 DIAGNOSIS — I5042 Chronic combined systolic (congestive) and diastolic (congestive) heart failure: Secondary | ICD-10-CM

## 2019-09-08 ENCOUNTER — Ambulatory Visit: Payer: Medicare Other | Attending: Internal Medicine

## 2019-09-08 DIAGNOSIS — Z23 Encounter for immunization: Secondary | ICD-10-CM | POA: Insufficient documentation

## 2019-09-08 NOTE — Progress Notes (Signed)
   Covid-19 Vaccination Clinic  Name:  Darren [name]    MRN: [id number] DOB: 1940-08-17  09/08/2019  Darren [name] was observed post Covid-19 immunization for 15 minutes without incidence. He was provided with Vaccine Information Sheet and instruction to access the V-Safe system.   Darren [name] was instructed to call 911 with any severe reactions post vaccine: [name] Difficulty breathing  . Swelling of your face and throat  . A fast heartbeat  . A bad rash all over your body  . Dizziness and weakness    Immunizations Administered    Name Date Dose VIS Date Route   Pfizer COVID-19 Vaccine 09/08/2019 10:33 AM 0.3 mL 06/20/2019 Intramuscular   Manufacturer: [place]   Lot: [id number]   [place]: [id number]

## 2019-09-11 ENCOUNTER — Other Ambulatory Visit: Payer: Self-pay | Admitting: Cardiology

## 2019-09-15 ENCOUNTER — Other Ambulatory Visit: Payer: Self-pay | Admitting: Internal Medicine

## 2019-10-07 ENCOUNTER — Ambulatory Visit: Payer: Medicare Other | Attending: Internal Medicine

## 2019-10-07 DIAGNOSIS — Z23 Encounter for immunization: Secondary | ICD-10-CM

## 2019-10-07 NOTE — Progress Notes (Signed)
   Covid-19 Vaccination Clinic  Name:  [name]    MRN: [id number] DOB: 23-Sep-1940  10/07/2019  Darren [name] was observed post Covid-19 immunization for 15 minutes without incident. He was provided with Vaccine Information Sheet and instruction to access the V-Safe system.   Darren [name] was instructed to call 911 with any severe reactions post vaccine: [name] Difficulty breathing  . Swelling of face and throat  . A fast heartbeat  . A bad rash all over body  . Dizziness and weakness   Immunizations Administered    Name Date Dose VIS Date Route   Pfizer COVID-19 Vaccine 10/07/2019 10:46 AM 0.3 mL 06/20/2019 Intramuscular   Manufacturer: [place]   Lot: [id number]   [place]: [id number]

## 2019-10-29 NOTE — Progress Notes (Signed)
Primary Physician/Referring:  [name], MD  Patient ID: Darren [name], male    DOB: 1941-01-11, 79 y.o.   MRN: [id number]  Chief Complaint  Patient presents with  . Paroxsmal Atrial Fibrillation  . Atrial Fibrillation  . Hypertension  . Follow-up    6 month   HPI:    Darren [name]  is a 79 y.o. male  with moderate obesity paroxysmal atrial fibrillation and probably sinus node dysfunction with  sinus bradycardia even on minimal dose of beta-blocker, OSA on CPAP, hyperlipidemia, bronchial asthma and history of excessive alcohol intake presents for F/U OF ATRIAL FIBRILLATION. He was started on sotalol on 12/18/2018 which he is tolerating.  Prior to cardioversion, he had developed severe LV systolic dysfunction but repeat echocardiogram in August 2020 revealed normalization of LVEF.  He presents for 3-monthoffice visit, presently doing well, spironolactone was discontinued due to mastodynia.  Leg edema has remained stable, dyspnea is stable, no chest pain or palpitation.  He has also been monitoring his heart rate with home monitoring.  Past Medical History:  Diagnosis Date  . Arthritis    "maybe a little"  . Asthma    "mild"  . [place]   . bilateral leg edema 09/11/2018  . Cancer ([facility]    prostate cancer  . Dyspnea   . Dyspnea on exertion 09/11/2018  . Dysrhythmia    Afib with rapid ventricular  . Essential hypertension 07/25/2018  . Fatty liver   . GERD (gastroesophageal reflux disease)   . GI bleed 2018  . Gout   . History of blood transfusion 2018  . Obstructive sleep apnea 09/11/2018  . OSA (obstructive sleep apnea)   . [place] 09/11/2018  . Pre-diabetes    Past Surgical History:  Procedure Laterality Date  . CARDIOVERSION N/A 10/08/2018   Procedure: CARDIOVERSION;  Surgeon: GAdrian Prows MD;  [facility];  Service: Cardiovascular;  Laterality: N/A;  . CARDIOVERSION N/A 01/17/2019   Procedure: CARDIOVERSION;   Surgeon: GAdrian Prows MD;  [place]  Service: Cardiovascular;  Laterality: N/A;  . COLONOSCOPY Left 03/10/2017   Procedure: COLONOSCOPY;  Surgeon: OArta Silence MD;  [facility];  Service: Endoscopy;  Laterality: Left;  . ESOPHAGOGASTRODUODENOSCOPY (EGD) WITH PROPOFOL Left 03/09/2017   Procedure: ESOPHAGOGASTRODUODENOSCOPY (EGD) WITH PROPOFOL;  Surgeon: KRonnette Juniper MD;  [place]  Service: Gastroenterology;  Laterality: Left;  . EYE SURGERY Bilateral    cataract   . FINGER SURGERY Right    5th  . Prostate     DaVinci   Social History   Tobacco Use  . Smoking status: Former Smoker    Packs/day: 1.00    Years: 25.00    Pack years: 25.00    Quit date: 07/10/1988    Years since quitting: 31.3  . Smokeless tobacco: Never Used  Substance Use Topics  . Alcohol use: Yes    Alcohol/week: 25.0 standard drinks    Types: 25 Shots of liquor per week   Marital Status: Married  ROS  Review of Systems  Cardiovascular: Negative for chest pain, dyspnea on exertion and leg swelling.  Respiratory: Positive for snoring (on CPAP).   Musculoskeletal: Positive for joint pain.  Gastrointestinal: Negative for melena.   Objective  Blood pressure (!) 168/75, pulse 65, temperature 98 F (36.7 C), temperature source Temporal, resp. rate 16, height 5' 11"  (1.803 m), weight 262 lb (118.8 kg), SpO2 93 %.  Vitals with BMI 10/30/2019 06/11/2019 05/01/2019  Height 5' 11"  5' 11"  5' 11"   Weight 262 lbs 254 lbs 13 oz 251 lbs 5 oz  BMI 36.56 16.10 96.04  Systolic 540 981 191  Diastolic 75 68 70  Pulse 65 60 50     Physical Exam  Constitutional: He appears well-developed. No distress.  Moderately obese  Neck: No JVD present.  Cardiovascular: Regular rhythm and normal heart sounds. Tachycardia present. Exam reveals no gallop.  No murmur heard. Pulses:      Carotid pulses are 2+ on the right side and 2+ on the left side.      Dorsalis pedis pulses are 2+ on the right  side and 2+ on the left side.       Posterior tibial pulses are 2+ on the right side and 2+ on the left side.  Femoral and popliteal pulse difficult to feel due to patient's body habitus.  2+ bilateral pitting leg edema. No JVD  Pulmonary/Chest: Effort normal and breath sounds normal.  Abdominal: Soft. Bowel sounds are normal.  Obese  Musculoskeletal:        General: Normal range of motion.   Laboratory examination:   Recent Labs    11/14/18 1500 11/14/18 1500 12/03/18 1425 01/13/19 1143 01/17/19 0711  NA 137   < > 144 135 134*  K 5.7*   < > 5.0 6.5* 5.1  CL 103  --  106 99  --   CO2 17*  --  18* 22  --   GLUCOSE 110*   < > 132* 96 97  BUN 33*  --  33* 32*  --   CREATININE 1.35*  --  1.24 1.41*  --   CALCIUM 9.9  --  9.1 9.6  --   GFRNONAA 51*  --  56* 48*  --   GFRAA 59*  --  65 56*  --    < > = values in this interval not displayed.   CrCl cannot be calculated (Patient's most recent lab result is older than the maximum 21 days allowed.).  CMP Latest Ref Rng & Units 01/17/2019 01/13/2019 12/03/2018  Glucose 70 - 99 mg/dL 97 96 132(H)  BUN 8 - 27 mg/dL - 32(H) 33(H)  Creatinine 0.76 - 1.27 mg/dL - 1.41(H) 1.24  Sodium 135 - 145 mmol/L 134(L) 135 144  Potassium 3.5 - 5.1 mmol/L 5.1 6.5(H) 5.0  Chloride 96 - 106 mmol/L - 99 106  CO2 20 - 29 mmol/L - 22 18(L)  Calcium 8.6 - 10.2 mg/dL - 9.6 9.1  Total Protein 6.5 - 8.1 g/dL - - -  Total Bilirubin 0.3 - 1.2 mg/dL - - -  Alkaline Phos 38 - 126 U/L - - -  AST 15 - 41 U/L - - -  ALT 17 - 63 U/L - - -   CBC Latest Ref Rng & Units 01/17/2019 10/08/2018 03/10/2017  WBC 4.0 - 10.5 K/uL - - -  Hemoglobin 13.0 - 17.0 g/dL 14.3 16.3 7.5(L)  Hematocrit 39.0 - 52.0 % 42.0 48.0 25.8(L)  Platelets 150 - 400 K/uL - - -   Lipid Panel  No results found for: CHOL, TRIG, HDL, CHOLHDL, VLDL, LDLCALC, LDLDIRECT HEMOGLOBIN A1C No results found for: HGBA1C, MPG TSH No results for input(s): TSH in the last 8760 hours.  External labs:    09/10/2018: HB 15.8/HCT 46.3, mild macrocytosis present with MCV of 100.  Platelet count 154.  Potassium 4.9, BUN 24, creatinine 1.1, eGFR greater than 60 mL.  ALT and AST elevated  at 64 and 57 respectively.  Total cholesterol 184, triglycerides 199, HDL 85, LDL 59.  07/12/2017: HB 15.6, HCT 47.8, platelets 155.  Normal indicis.  Potassium 4.8, serum glucose 160 mg, BUN 23, creatinine 1.1.  CMP normal.  Total cholesterol 232, triglycerides 138, HDL 62, LDL 07/10/40.  Non-HDL cholesterol 170.  Uric acid is elevated at 7.8, vitamin D 25 x 1.  Medications and allergies   Allergies  Allergen Reactions  . Amlodipine     Leg edema  . Penicillins Hives    Has patient had a PCN reaction causing immediate rash, facial/tongue/throat swelling, SOB or lightheadedness with hypotension: No Has patient had a PCN reaction causing severe rash involving mucus membranes or skin necrosis: No Has patient had a PCN reaction that required hospitalization: No Has patient had a PCN reaction occurring within the last 10 years: No--childhood reaction If all of the above answers are "NO", then may proceed with Cephalosporin use.     Current Outpatient Medications  Medication Instructions  . allopurinol (ZYLOPRIM) 100 mg, Oral, Daily  . ammonium lactate (AMLACTIN) 1 g, Topical, As needed  . atorvastatin (LIPITOR) 40 mg, Oral, Daily  . Cholecalciferol (VITAMIN D-3 PO) 2,000 Units, Oral, Daily  . colchicine 0.6 mg, Oral, Daily PRN  . colesevelam [facility]) 1,875 mg, Oral, 2 times daily with meals  . ELIQUIS 5 MG TABS tablet TAKE 1 TABLET TWICE A DAY  . hydrALAZINE (APRESOLINE) 25 MG tablet TAKE 1 TABLET BY MOUTH 3 TIMES DAILY.  . isosorbide dinitrate (ISORDIL) 30 mg, Oral, 3 times daily  . L-Methylfolate-Algae-B12-B6 (METANX) 3-90.314-2-35 MG CAPS 1 tablet, Oral, 2 times daily  . Magnesium 400 mg, Oral, Daily  . metoprolol tartrate (LOPRESSOR) 25 MG tablet TAKE 1 TABLET BY MOUTH 2 TIMES DAILY.  .  mometasone-formoterol (DULERA) 100-5 MCG/ACT AERO 2 puffs, Inhalation, 2 times daily  . montelukast (SINGULAIR) 10 MG tablet TAKE 1 TABLET BY MOUTH AT BEDTIME  . omeprazole (PRILOSEC) 20 mg, Oral, 2 times daily before meals  . Polyethyl Glycol-Propyl Glycol (SYSTANE OP) 1 drop, Both Eyes, 3 times daily PRN  . sotalol (BETAPACE) 80 MG tablet TAKE 1 TABLET BY MOUTH 2 TIMES DAILY.  [name] telmisartan (MICARDIS) 80 mg, Oral, Daily  . thiamine (VITAMIN B-1) 100 mg, Oral, Daily    Radiology:  No results found.  Cardiac Studies:   Lexiscan myoview stress test 09/20/2018:  1. Lexiscan stress test was performed. Exercise capacity was not assessed. Stress symptoms included dizziness. Resting blood pressure was 164/108 mmHg and peak effect blood pressure was 174/116 mmHg. The resting and stress electrocardiogram demonstrated atrial fibrillation with rapid ventricular rate, occasional PVC, and normal rest repolarization.  Stress EKG is non diagnostic for ischemia as it is a pharmacologic stress. In addition, no ischemic changes seen at heart rate of 136 bpm.  2. The overall quality of the study is good.  Left ventricular cavity is noted to be enlarged on the rest and stress studies.  Gated SPECT images reveal moderate global decrease in myocardial thickening and wall motion.  The left ventricular ejection fraction was calculated or visually estimated to be 34%.  REST and STRESS images demonstrate mildly decreased tracer uptake in the basal inferior, mid inferior and apical inferior segments of the left ventricle, with no reversibility. Findings suggestive of dilated nonishcemic cardiomyopathy.  3. High risk study due to reduced LVED. Recommend clinical correlation.   Echocardiogram 02/11/2019:  Left ventricle cavity is mildly dilated. Mild concentric hypertrophy of  the left ventricle.  Normal global wall motion. Normal LV systolic function  with EF 54%. Normal diastolic filling pattern.  Left atrial cavity is  mildly dilated.  Mild (Grade I) mitral regurgitation.  Mild pulmonic regurgitation.  Compared to previous study on 09/23/2018, there is significant improvement  in LVEF from 30% and severity of mitral regurgitation.  EKG 10/30/2019: Normal sinus rhythm at the rate of 60 bpm, left axis deviation, left anterior fascicular block.  Incomplete right bundle branch block.  Normal QT interval.  No evidence of ischemia.  No significant change from EKG 05/01/2019: No present PVC (2).   Assessment     ICD-10-CM   1. Paroxysmal atrial fibrillation ([place]). CHA2DS2-VASc Score is 4.  Yearly risk of stroke: 4% (A, HTN, CHF).  I48.0 EKG 12-Lead  2. Essential hypertension  I10 isosorbide dinitrate (ISORDIL) 30 MG tablet  3. Obstructive sleep apnea  G47.33   4. Chronic diastolic heart failure ([facility])  I50.32     Meds ordered this encounter  Medications  . isosorbide dinitrate (ISORDIL) 30 MG tablet    Sig: Take 1 tablet (30 mg total) by mouth 3 (three) times daily.    Dispense:  90 tablet    Refill:  2    Medications Discontinued During This Encounter  Medication Reason  . spironolactone (ALDACTONE) 25 MG tablet Side effect (s)  . spironolactone (ALDACTONE) 25 MG tablet Side effect (s)     Recommendations:   Darren [name]  is a 79 y.o. male  with moderate obesity paroxysmal atrial fibrillation and probably sinus node dysfunction with  sinus bradycardia even on minimal dose of beta-blocker, OSA on CPAP, hyperlipidemia, bronchial asthma and history of excessive alcohol intake presents for F/U OF ATRIAL FIBRILLATION. He was started on sotalol on 12/18/2018 which he is tolerating.  Prior to cardioversion, he had developed severe LV systolic dysfunction but repeat echocardiogram in August 2020 revealed normalization of LVEF.  He is presently doing well and tolerating sotalol and maintaining sinus rhythm. Weight loss was discussed extensively. Continue anticoagulation.  He is compliant with his CPAP, since  discontinuation of Aldactone due to mastodynia, blood pressure is elevated.  I will add isosorbide dinitrate 30 mg p.o. 3 times daily.  He has an appointment to see Dr. Mellody Drown next month, if blood pressure is controlled he can get 90-day supplies of the same and continue to follow-up with him for blood pressure management.  I will obtain recently performed labs, this is to evaluate his renal function in view of patient being on sotalol.  He is maintaining sinus rhythm.  I will see him back in 6 months.  [name], MD, [facility] 10/30/2019, 11:40 AM [facility] [place] Office: [phone]

## 2019-10-30 ENCOUNTER — Ambulatory Visit: Payer: Medicare Other | Admitting: Cardiology

## 2019-10-30 ENCOUNTER — Other Ambulatory Visit: Payer: Self-pay

## 2019-10-30 ENCOUNTER — Encounter: Payer: Self-pay | Admitting: Cardiology

## 2019-10-30 VITALS — BP 168/75 | HR 65 | Temp 98.0°F | Resp 16 | Ht 71.0 in | Wt 262.0 lb

## 2019-10-30 DIAGNOSIS — I480 Paroxysmal atrial fibrillation: Secondary | ICD-10-CM

## 2019-10-30 DIAGNOSIS — I10 Essential (primary) hypertension: Secondary | ICD-10-CM

## 2019-10-30 DIAGNOSIS — G4733 Obstructive sleep apnea (adult) (pediatric): Secondary | ICD-10-CM

## 2019-10-30 DIAGNOSIS — I5032 Chronic diastolic (congestive) heart failure: Secondary | ICD-10-CM

## 2019-10-30 MED ORDER — ISOSORBIDE DINITRATE 30 MG PO TABS: 30.0000 mg | ORAL_TABLET | Freq: Three times a day (TID) | ORAL | 2 refills | Status: DC

## 2019-12-05 ENCOUNTER — Other Ambulatory Visit: Payer: Self-pay | Admitting: Cardiology

## 2019-12-05 DIAGNOSIS — I5042 Chronic combined systolic (congestive) and diastolic (congestive) heart failure: Secondary | ICD-10-CM

## 2019-12-23 ENCOUNTER — Telehealth: Payer: Self-pay | Admitting: Internal Medicine

## 2019-12-23 MED ORDER — MOMETASONE FURO-FORMOTEROL FUM 100-5 MCG/ACT IN AERO: 2.0000 | INHALATION_SPRAY | Freq: Two times a day (BID) | RESPIRATORY_TRACT | 1 refills | Status: DC

## 2019-12-23 NOTE — Telephone Encounter (Signed)
Pt requesting refill on Dulera.  This has been sent to pharmacy.  Nothing further needed at this time- will close encounter.

## 2019-12-26 ENCOUNTER — Telehealth: Payer: Self-pay | Admitting: Pulmonary Disease

## 2019-12-26 ENCOUNTER — Telehealth: Payer: Self-pay | Admitting: Internal Medicine

## 2019-12-26 NOTE — Telephone Encounter (Signed)
PA request was received from (pharmacy): [facility] [phone]  Medication name and strength: Dulera 100-5 , Symbicort 80-4.5 Ordering Provider: Dr. Ander Slade  Was PA started with [facility]?: yes If yes, please enter KEY: [id number] Medication tried and failed: Advair 115-21 Covered Alternatives: Breo Ellipta 100-25, Advair 115-21, Flovent 110, Anoro Ellipta, Trelegy Ellipta 100-62.5-25, Arnuity Ellipta 100, Incruse Ellipta  PA sent to plan, time frame for approval / denial: Approvedtoday CaseId:[id number];Status:Approved;Review Type:Prior Auth;Coverage Start Date:11/26/2019;Coverage End Date:07/09/2098

## 2019-12-26 NOTE — Telephone Encounter (Signed)
I spoke with the pharmacy and let them know that the PA had been approved for [facility].  I called the patient and made him aware as well.  Nothing further needed.

## 2019-12-26 NOTE — Telephone Encounter (Signed)
Called patients pharmacy and requested them to fax [place] the information needed to start a prior auth.  Attempted to call patient and reached a busy signal.

## 2019-12-26 NOTE — Telephone Encounter (Signed)
PA for [facility] approved.  Pharmacy and patient aware.  Nothing further needed.

## 2019-12-29 ENCOUNTER — Telehealth: Payer: Self-pay | Admitting: Pulmonary Disease

## 2019-12-29 ENCOUNTER — Telehealth: Payer: Self-pay | Admitting: Internal Medicine

## 2019-12-29 MED ORDER — MOMETASONE FURO-FORMOTEROL FUM 100-5 MCG/ACT IN AERO: 2.0000 | INHALATION_SPRAY | Freq: Two times a day (BID) | RESPIRATORY_TRACT | 3 refills | Status: DC

## 2019-12-29 NOTE — Telephone Encounter (Signed)
error 

## 2019-12-29 NOTE — Telephone Encounter (Signed)
Rx for pt's dulera has been sent to preferred pharmacy for pt. Called and spoke with pt letting him know this had been done and he verbalized understanding. Nothing further needed.

## 2020-01-02 ENCOUNTER — Other Ambulatory Visit: Payer: Self-pay | Admitting: Cardiology

## 2020-01-30 ENCOUNTER — Other Ambulatory Visit: Payer: Self-pay | Admitting: Internal Medicine

## 2020-02-11 ENCOUNTER — Ambulatory Visit (INDEPENDENT_AMBULATORY_CARE_PROVIDER_SITE_OTHER): Payer: Medicare Other | Admitting: Internal Medicine

## 2020-02-11 ENCOUNTER — Encounter: Payer: Self-pay | Admitting: Internal Medicine

## 2020-02-11 ENCOUNTER — Other Ambulatory Visit: Payer: Self-pay

## 2020-02-11 DIAGNOSIS — J45991 Cough variant asthma: Secondary | ICD-10-CM | POA: Diagnosis not present

## 2020-02-11 NOTE — Patient Instructions (Addendum)
Make sure you check your oxygen saturations at highest level of activity to be sure it stays over 90% and keep track of it at least once a week, more often if breathing getting worse, and let me know if losing ground.    Keep working on weight loss    Please schedule a follow up visit in 12 months but call sooner if needed

## 2020-02-11 NOTE — Progress Notes (Signed)
Subjective:   Patient ID: Darren [name], male    DOB: 11-20-1940,   MRN: [id number]    Brief patient profile:  52 yowm quit smoking 1990 with cough that resolved and pattern of recurrent rhinitis since childhood spring > fall with onset of recurrent flares of cough / wheezing around 2005 resolves from days to weeks p prednisone cycle needing up to 4 x on avg  referred to pulmonary clinic 03/30/2016 by Dr   Dr Mellody Drown    History of Present Illness  03/30/2016 1st [facility] office visit/ Darren [name]   Chief Complaint  Patient presents with  . Pulmonary Consult    Referred by Dr. [name]. Pt c/o cough and wheezing off and on "for years".    last prednisone was 15 days one week prior to OV  Back to baseline = doe x MMRC1 = can walk nl pace, flat grade, can't hurry or go uphills or steps s sob  On proair once each am  rec Plan A = Automatic = Symbicort 80 Take 2 puffs first thing in am and then another 2 puffs about 12 hours later.  Plan B = Backup Only use your albuterol as a rescue medication          07/17/2017  f/u ov/Darren [name] re:  AB/ rhinitis with pnds is main concern Chief Complaint  Patient presents with  . Follow-up    Pt states still having to clear his throat, worse over the past 2 months.   cough does not wake him up/ sleeps ok on cpap and w/in an hour feels urge to clear throat but even then < a tbsp of mucus  clariton not effective, has not tried zyrtec or 1st gen H1 yet  Doe still = MMRC2 / not needing saba  rec Try zyrtec 10 mg at bedtime as needed for drippy nose  (available over the counter) - also can try For drainage / throat tickle try take CHLORPHENIRAMINE  4 mg - take one every 4 hours as needed -   Try dulera 100 Take 2 puffs first thing in am and then another 2 puffs about 12 hours later.  Work on maintaining perfect  inhaler technique:    01/21/2018  f/u ov/Darren [name] re:  AB/ pnds  Better zyrtec  Chief Complaint  Patient presents with  . Follow-up    Pt  states that he has had a "cold" for a wk- seen by PCP and had a steroid shot. He states the steroid shot helped. He is coughing some and producing some brown sputum.   Dyspnea:  MMRC2 = can't walk a nl pace on a flat grade s sob but does fine slow and flat   Recumbent bike x 20 min x 3-5 x per week  Cough: was better until "caught a cold > pcp rx  pred but no abx and min improved  rec Omnicef 300 mg twice daily x 10 days       07/24/2018  f/u ov/Darren [name] re:  AB  maint on dulera 100 2bid / singualir q pm and now on labetolol  Chief Complaint  Patient presents with  . Follow-up    Pt states he feels like he has had increased congestion over the past several wks. He has not had to use his rescue inhaler.   Dyspnea:   Doing gym cardio x 20 min legs give out first Cough: min mucoid in am's Sleeping: on cpap  SABA use: less saba despite started on  labetolol  02: no / just cpap ? per [place]  rec No change in your medications but if you are having problems with your breathing or needing your rescue inhaler (albuterol)  Then I would strongly recommend bisporolol instead of labetolol      02/11/2020  f/u ov/Darren [name] re: AB better on dulera 100 bid / singulair  Chief Complaint  Patient presents with  . Follow-up    c/o dyspnea and leg swelling x2 months  Dyspnea:  Starting to work out again, not checking sats  Cough: minimal Sleeping: on side/ bed is flat/ wedge pillow / cpap per  [place]  SABA use: none  02: none    No obvious day to day or daytime variability or assoc excess/ purulent sputum or mucus plugs or hemoptysis or cp or chest tightness, subjective wheeze or overt sinus or hb symptoms.   Sleeping without nocturnal  or early am exacerbation  of respiratory  c/o's or need for noct saba. Also denies any obvious fluctuation of symptoms with weather or environmental changes or other aggravating or alleviating factors except as outlined above   No unusual exposure hx or h/o childhood pna/  asthma or knowledge of premature birth.  Current Allergies, Complete Past Medical History, Past Surgical History, Family History, and Social History were reviewed in [employer] record.  ROS  The following are not active complaints unless bolded Hoarseness, sore throat, dysphagia occ , dental problems, itching, sneezing,  nasal congestion or discharge of excess mucus or purulent secretions, ear ache,   fever, chills, sweats, unintended wt loss or wt gain, classically pleuritic or exertional cp,  orthopnea pnd or arm/hand swelling  or leg swelling, presyncope, palpitations, abdominal pain, anorexia, nausea, vomiting, diarrhea  or change in bowel habits or change in bladder habits, change in stools or change in urine, dysuria, hematuria,  rash, arthralgias, visual complaints, headache, numbness, weakness or ataxia or problems with walking or coordination,  change in mood or  memory.        Current Meds  Medication Sig  . allopurinol (ZYLOPRIM) 100 MG tablet Take 100 mg by mouth daily.   [name] ammonium lactate (AMLACTIN) 12 % cream Apply 1 g topically as needed for dry skin (scaly feet).  [name] atorvastatin (LIPITOR) 40 MG tablet Take 40 mg by mouth daily.   . Cholecalciferol (VITAMIN D-3 PO) Take 2,000 Units by mouth daily.   . colchicine 0.6 MG tablet Take 0.6 mg by mouth daily as needed (gout).   . colesevelam (WELCHOL) 625 MG tablet Take 1,875 mg by mouth 2 (two) times daily with a meal.   . ELIQUIS 5 MG TABS tablet TAKE 1 TABLET TWICE A DAY  . hydrALAZINE (APRESOLINE) 25 MG tablet TAKE 1 TABLET BY MOUTH 3 TIMES DAILY.  . isosorbide dinitrate (ISORDIL) 30 MG tablet Take 1 tablet (30 mg total) by mouth 3 (three) times daily.  [name] L-Methylfolate-Algae-B12-B6 (METANX) 3-90.314-2-35 MG CAPS Take 1 tablet by mouth 2 (two) times daily.   . Magnesium 400 MG TABS Take 400 mg by mouth daily at 12 noon.   . metoprolol tartrate (LOPRESSOR) 25 MG tablet TAKE 1 TABLET BY MOUTH 2 TIMES DAILY.    . mometasone-formoterol (DULERA) 100-5 MCG/ACT AERO Inhale 2 puffs into the lungs 2 (two) times daily.  . montelukast (SINGULAIR) 10 MG tablet TAKE 1 TABLET BY MOUTH AT BEDTIME  . omeprazole (PRILOSEC) 20 MG capsule Take 20 mg by mouth 2 (two) times daily before a meal.  .  Polyethyl Glycol-Propyl Glycol (SYSTANE OP) Place 1 drop into both eyes 3 (three) times daily as needed (dry eyes).  . sotalol (BETAPACE) 80 MG tablet TAKE 1 TABLET BY MOUTH 2 TIMES DAILY. (Patient taking differently: Take 40 mg by mouth 2 (two) times daily. )  . telmisartan (MICARDIS) 80 MG tablet Take 80 mg by mouth daily.  [name] thiamine (VITAMIN B-1) 100 MG tablet Take 100 mg by mouth daily.                  Objective:   Physical Exam  amb mod obese wm nad   02/11/2020         262  07/24/2018       262  01/21/2018       265  07/17/2017          258  03/29/2017        241 01/09/2017          263  11/28/2016        267  09/06/16         273   06/07/16 267 lb (121.1 kg)  05/10/16 264 lb (119.7 kg)  03/30/16 261 lb (118.4 kg)     Vital signs reviewed  02/11/2020  - Note at rest 02 sats  98% on RA          min ins/exp rhonchi  Bilaterally  Tensely obese       HEENT : pt wearing mask not removed for exam due to covid -19 concerns.    NECK :  without JVD/Nodes/TM/ nl carotid upstrokes bilaterally   LUNGS: no acc muscle use,  Nl contour chest with minimal insp/exp  bilaterally without cough on insp or exp maneuvers   CV:  RRR  no s3 or murmur or increase in P2, and trace sym ankle pitting  edema   ABD:  Quite obese but soft and nontender with nl inspiratory excursion in the supine position. No bruits or organomegaly appreciated, bowel sounds nl  MS:  Nl gait/ ext warm without deformities, calf tenderness, cyanosis or clubbing No obvious joint restrictions   SKIN: warm and dry without lesions    NEURO:  alert, approp, nl sensorium with  no motor or cerebellar deficits apparent.     Assessment & Plan:

## 2020-02-11 NOTE — Assessment & Plan Note (Signed)
Onset around 2005  FENO 03/30/2016  =   47 on just saba  - 03/30/2016  After extensive coaching HFA effectiveness =    90% :  Try symbicort 80 2bid x 6 weeks then return with repeat spirometry >  04/06/2016 notified insurance prefers advair > try 115 hfa 2 bid   - Allergy profile 05/10/2016 >  Eos 0.4 /  IgE 60 Pos Grass/ cedar trees   - 05/10/2016 changed back to 4 weeks of symb 80 p flared on advair - FENO 06/07/2016  =   10 on symb 80 2bid  - Spirometry 06/07/2016  FEV1 1.89 (58%)  Ratio  71   - 09/06/2016   try dulera 200 2bid - 11/28/2016 excess shaking on duelra 200 2bid so try one bid and off gerd rx since is not convinced worked - Sinus CT 11/29/2016 >>> Mucosal edema in the maxillary and ethmoid sinuses bilaterally. No air-fluid level. - Singulair 10 mg started first week in Sept 2018   - 07/17/2017   rechallenge with the dulera 100  - 07/24/2018  After extensive coaching inhaler device,  effectiveness =    90% but some "congestion" since started labetolol   Despite use of lopressor and sotatolol >>> All goals of chronic asthma control met including optimal function and elimination of symptoms with no  need for rescue therapy on dulera 100 and singualir 10 mg daily  Key is working on wt loss / monitoring sats at  [facility] to be sure they stay> 90% / discussed   F/u can be q 12 m, sooner prn

## 2020-02-11 NOTE — Assessment & Plan Note (Signed)
Complicated by hbp/ osa /hyperlipidemia   Body mass index is 36.54 kg/m.  -  No change   No results found for: TSH   Contributing to doe/reviewed the need and the process to achieve and maintain neg calorie balance > defer f/u primary care including intermittently monitoring thyroid status             Each maintenance medication was reviewed in detail including emphasizing most importantly the difference between maintenance and prns and under what circumstances the prns are to be triggered using an action plan format where appropriate.  Total time for H and P, chart review, counseling, reviewing HFA device and generating customized AVS unique to this office visit / charting = 20 min

## 2020-02-13 ENCOUNTER — Ambulatory Visit: Payer: Medicare Other | Admitting: Internal Medicine

## 2020-03-03 ENCOUNTER — Other Ambulatory Visit: Payer: Self-pay | Admitting: Internal Medicine

## 2020-03-22 ENCOUNTER — Other Ambulatory Visit: Payer: Self-pay | Admitting: Cardiology

## 2020-03-22 DIAGNOSIS — I480 Paroxysmal atrial fibrillation: Secondary | ICD-10-CM

## 2020-03-25 ENCOUNTER — Other Ambulatory Visit: Payer: Self-pay | Admitting: Cardiology

## 2020-03-25 DIAGNOSIS — I5042 Chronic combined systolic (congestive) and diastolic (congestive) heart failure: Secondary | ICD-10-CM

## 2020-04-08 ENCOUNTER — Other Ambulatory Visit: Payer: Self-pay | Admitting: Cardiology

## 2020-04-08 DIAGNOSIS — I480 Paroxysmal atrial fibrillation: Secondary | ICD-10-CM

## 2020-04-20 ENCOUNTER — Other Ambulatory Visit: Payer: Self-pay | Admitting: Cardiology

## 2020-04-21 ENCOUNTER — Other Ambulatory Visit: Payer: Self-pay

## 2020-04-21 ENCOUNTER — Ambulatory Visit: Payer: Medicare Other | Admitting: Cardiology

## 2020-04-21 ENCOUNTER — Encounter: Payer: Self-pay | Admitting: Cardiology

## 2020-04-21 VITALS — BP 134/77 | HR 68 | Resp 16 | Ht 71.0 in | Wt 262.0 lb

## 2020-04-21 DIAGNOSIS — I5032 Chronic diastolic (congestive) heart failure: Secondary | ICD-10-CM

## 2020-04-21 DIAGNOSIS — I480 Paroxysmal atrial fibrillation: Secondary | ICD-10-CM

## 2020-04-21 DIAGNOSIS — I10 Essential (primary) hypertension: Secondary | ICD-10-CM

## 2020-04-21 DIAGNOSIS — G4733 Obstructive sleep apnea (adult) (pediatric): Secondary | ICD-10-CM

## 2020-04-24 NOTE — Progress Notes (Signed)
Primary Physician/Referring:  [name], MD  Patient ID: Darren [name], male    DOB: [birth year], 79 y.o.   MRN: [id number]  Chief Complaint  Patient presents with  . Paroxysmal atrial fibrillation (HCC)  . Hypertension  . Follow-up    6 month    HPI:    Darren [name]  is a 79 y.o. male  with moderate obesity paroxysmal atrial fibrillation and probably sinus node dysfunction with  sinus bradycardia even on minimal dose of beta-blocker, OSA on CPAP, hyperlipidemia, bronchial asthma and history of excessive alcohol intake presents for F/U OF ATRIAL FIBRILLATION. He was started on sotalol on 12/18/2018 which he is tolerating.  Prior to cardioversion, he had developed severe LV systolic dysfunction but repeat echocardiogram in August 2020 revealed normalization of LVEF.  He presents for 53-monthoffice visit, presently doing well. Leg edema has remained stable, dyspnea is stable, no chest pain or palpitation.  He has also been monitoring his heart rate with home monitoring.  Past Medical History:  Diagnosis Date  . Arthritis    "maybe a little"  . Asthma    "mild"  . Atrial fibrillation ([facility]   . bilateral leg edema 09/11/2018  . Cancer ([facility]    prostate cancer  . Dyspnea   . Dyspnea on exertion 09/11/2018  . Dysrhythmia    Afib with rapid ventricular  . Essential hypertension 07/25/2018  . Fatty liver   . GERD (gastroesophageal reflux disease)   . GI bleed 2018  . Gout   . History of blood transfusion 2018  . Obstructive sleep apnea 09/11/2018  . OSA (obstructive sleep apnea)   . Paroxysmal atrial fibrillation ([facility] 09/11/2018  . Pre-diabetes    Past Surgical History:  Procedure Laterality Date  . CARDIOVERSION N/A 10/08/2018   Procedure: CARDIOVERSION;  Surgeon: GAdrian Prows MD;  [facility];  Service: Cardiovascular;  Laterality: N/A;  . CARDIOVERSION N/A 01/17/2019   Procedure: CARDIOVERSION;  Surgeon: GAdrian Prows MD;  [place] [facility]  Service:  Cardiovascular;  Laterality: N/A;  . COLONOSCOPY Left 03/10/2017   Procedure: COLONOSCOPY;  Surgeon: OArta Silence MD;  [facility];  Service: Endoscopy;  Laterality: Left;  . ESOPHAGOGASTRODUODENOSCOPY (EGD) WITH PROPOFOL Left 03/09/2017   Procedure: ESOPHAGOGASTRODUODENOSCOPY (EGD) WITH PROPOFOL;  Surgeon: KRonnette Juniper MD;  [place] [facility]  Service: Gastroenterology;  Laterality: Left;  . EYE SURGERY Bilateral    cataract   . FINGER SURGERY Right    5th  . Prostate     DaVinci   Social History   Tobacco Use  . Smoking status: Former Smoker    Packs/day: 1.00    Years: 25.00    Pack years: 25.00    Quit date: 07/10/1988    Years since quitting: 31.8  . Smokeless tobacco: Never Used  Substance Use Topics  . Alcohol use: Yes    Alcohol/week: 25.0 standard drinks    Types: 25 Shots of liquor per week    Comment: Daily    Marital Status: Married  ROS  Review of Systems  Cardiovascular: Negative for chest pain, dyspnea on exertion and leg swelling.  Respiratory: Positive for snoring (on CPAP).   Musculoskeletal: Positive for joint pain.  Gastrointestinal: Negative for melena.   Objective  Blood pressure 134/77, pulse 68, resp. rate 16, height 5' 11"  (1.803 m), weight 262 lb (118.8 kg), SpO2 94 %.  Vitals with BMI 04/21/2020 02/11/2020 10/30/2019  Height 5' 11"  5' 11"  5' 11"   Weight 262 lbs 262  lbs 262 lbs  BMI 36.56 50.09 38.18  Systolic 299 371 696  Diastolic 77 90 75  Pulse 68 68 65     Physical Exam Constitutional:      General: He is not in acute distress.    Appearance: He is well-developed.     Comments: Moderately obese  Neck:     Vascular: No JVD.  Cardiovascular:     Rate and Rhythm: Regular rhythm. Tachycardia present.     Pulses:          Carotid pulses are 2+ on the right side and 2+ on the left side.      Dorsalis pedis pulses are 2+ on the right side and 2+ on the left side.       Posterior tibial pulses are 2+ on the right side and  2+ on the left side.     Heart sounds: Normal heart sounds. No murmur heard.  No gallop.      Comments: Femoral and popliteal pulse difficult to feel due to patient's body habitus.  2+ bilateral pitting leg edema. No JVD Pulmonary:     Effort: Pulmonary effort is normal.     Breath sounds: Normal breath sounds.  Abdominal:     General: Bowel sounds are normal.     Palpations: Abdomen is soft.     Comments: Obese  Musculoskeletal:        General: Normal range of motion.    Laboratory examination:   No results for input(s): NA, K, CL, CO2, GLUCOSE, BUN, CREATININE, CALCIUM, GFRNONAA, GFRAA in the last 8760 hours. CrCl cannot be calculated (Patient's most recent lab result is older than the maximum 21 days allowed.).  CMP Latest Ref Rng & Units 01/17/2019 01/13/2019 12/03/2018  Glucose 70 - 99 mg/dL 97 96 132(H)  BUN 8 - 27 mg/dL - 32(H) 33(H)  Creatinine 0.76 - 1.27 mg/dL - 1.41(H) 1.24  Sodium 135 - 145 mmol/L 134(L) 135 144  Potassium 3.5 - 5.1 mmol/L 5.1 6.5(H) 5.0  Chloride 96 - 106 mmol/L - 99 106  CO2 20 - 29 mmol/L - 22 18(L)  Calcium 8.6 - 10.2 mg/dL - 9.6 9.1  Total Protein 6.5 - 8.1 g/dL - - -  Total Bilirubin 0.3 - 1.2 mg/dL - - -  Alkaline Phos 38 - 126 U/L - - -  AST 15 - 41 U/L - - -  ALT 17 - 63 U/L - - -   CBC Latest Ref Rng & Units 01/17/2019 10/08/2018 03/10/2017  WBC 4.0 - 10.5 K/uL - - -  Hemoglobin 13.0 - 17.0 g/dL 14.3 16.3 7.5(L)  Hematocrit 39 - 52 % 42.0 48.0 25.8(L)  Platelets 150 - 400 K/uL - - -   Lipid Panel  No results found for: CHOL, TRIG, HDL, CHOLHDL, VLDL, LDLCALC, LDLDIRECT HEMOGLOBIN A1C No results found for: HGBA1C, MPG TSH No results for input(s): TSH in the last 8760 hours.  External labs:   09/10/2018: HB 15.8/HCT 46.3, mild macrocytosis present with MCV of 100.  Platelet count 154.  Potassium 4.9, BUN 24, creatinine 1.1, eGFR greater than 60 mL.  ALT and AST elevated at 64 and 57 respectively.  Total cholesterol 184, triglycerides 199,  HDL 85, LDL 59.  07/12/2017: HB 15.6, HCT 47.8, platelets 155.  Normal indicis.  Potassium 4.8, serum glucose 160 mg, BUN 23, creatinine 1.1.  CMP normal.  Total cholesterol 232, triglycerides 138, HDL 62, LDL 07/10/40.  Non-HDL cholesterol 170.  Uric acid is elevated at  7.8, vitamin D 25 x 1.  Medications and allergies   Allergies  Allergen Reactions  . Amlodipine     Leg edema  . Penicillins Hives    Has patient had a PCN reaction causing immediate rash, facial/tongue/throat swelling, SOB or lightheadedness with hypotension: No Has patient had a PCN reaction causing severe rash involving mucus membranes or skin necrosis: No Has patient had a PCN reaction that required hospitalization: No Has patient had a PCN reaction occurring within the last 10 years: No--childhood reaction If all of the above answers are "NO", then may proceed with Cephalosporin use.    Current Outpatient Medications on File Prior to Visit  Medication Sig Dispense Refill  . allopurinol (ZYLOPRIM) 100 MG tablet Take 100 mg by mouth daily.     [name] ammonium lactate (AMLACTIN) 12 % cream Apply 1 g topically as needed for dry skin (scaly feet).    [name] atorvastatin (LIPITOR) 40 MG tablet Take 40 mg by mouth daily.     . Cholecalciferol (VITAMIN D-3 PO) Take 2,000 Units by mouth daily.     . colchicine 0.6 MG tablet Take 0.6 mg by mouth daily as needed (gout).     . colesevelam (WELCHOL) 625 MG tablet Take 1,875 mg by mouth 2 (two) times daily with a meal.     . ELIQUIS 5 MG TABS tablet TAKE 1 TABLET TWICE A DAY 60 tablet 11  . hydrALAZINE (APRESOLINE) 25 MG tablet TAKE 1 TABLET BY MOUTH 3 TIMES DAILY. 90 tablet 3  . isosorbide dinitrate (ISORDIL) 30 MG tablet Take 1 tablet (30 mg total) by mouth 3 (three) times daily. 90 tablet 2  . L-Methylfolate-Algae-B12-B6 (METANX) 3-90.314-2-35 MG CAPS Take 1 tablet by mouth 2 (two) times daily.     . Magnesium 400 MG TABS Take 400 mg by mouth daily at 12 noon.     . metoprolol tartrate  (LOPRESSOR) 25 MG tablet TAKE 1 TABLET BY MOUTH 2 TIMES DAILY. 60 tablet 3  . mometasone-formoterol (DULERA) 100-5 MCG/ACT AERO Inhale 2 puffs into the lungs 2 (two) times daily. 39 g 3  . montelukast (SINGULAIR) 10 MG tablet TAKE 1 TABLET BY MOUTH AT BEDTIME 30 tablet 11  . omeprazole (PRILOSEC) 20 MG capsule Take 20 mg by mouth 2 (two) times daily before a meal.    . Polyethyl Glycol-Propyl Glycol (SYSTANE OP) Place 1 drop into both eyes 3 (three) times daily as needed (dry eyes).    . sotalol (BETAPACE) 80 MG tablet TAKE 1 TABLET BY MOUTH 2 TIMES DAILY. (Patient taking differently: Take 40 mg by mouth 2 (two) times daily. ) 60 tablet 6  . telmisartan (MICARDIS) 80 MG tablet Take 80 mg by mouth daily.    [name] thiamine (VITAMIN B-1) 100 MG tablet Take 100 mg by mouth daily.     No current facility-administered medications on file prior to visit.    Radiology:  No results found.  Cardiac Studies:   Lexiscan myoview stress test 09/20/2018:  1. Lexiscan stress test was performed. Exercise capacity was not assessed. Stress symptoms included dizziness. Resting blood pressure was 164/108 mmHg and peak effect blood pressure was 174/116 mmHg. The resting and stress electrocardiogram demonstrated atrial fibrillation with rapid ventricular rate, occasional PVC, and normal rest repolarization.  Stress EKG is non diagnostic for ischemia as it is a pharmacologic stress. In addition, no ischemic changes seen at heart rate of 136 bpm.  2. The overall quality of the study is good.  Left ventricular  cavity is noted to be enlarged on the rest and stress studies.  Gated SPECT images reveal moderate global decrease in myocardial thickening and wall motion.  The left ventricular ejection fraction was calculated or visually estimated to be 34%.  REST and STRESS images demonstrate mildly decreased tracer uptake in the basal inferior, mid inferior and apical inferior segments of the left ventricle, with no reversibility.  Findings suggestive of dilated nonishcemic cardiomyopathy.  3. High risk study due to reduced LVED. Recommend clinical correlation.   Echocardiogram 02/11/2019:  Left ventricle cavity is mildly dilated. Mild concentric hypertrophy of  the left ventricle. Normal global wall motion. Normal LV systolic function  with EF 54%. Normal diastolic filling pattern.  Left atrial cavity is mildly dilated.  Mild (Grade I) mitral regurgitation.  Mild pulmonic regurgitation.  Compared to previous study on 09/23/2018, there is significant improvement  in LVEF from 30% and severity of mitral regurgitation.  EKG:   EKG 05/08/2020: Sinus rhythm with first-degree AV block at rate of 67 bpm, left atrial enlargement, left axis deviation, left anterior fascicular block.  Incomplete right bundle branch block.  Nonspecific T abnormality.  Single PVC.   EKG 10/30/2019: Normal sinus rhythm at the rate of 60 bpm, left axis deviation, left anterior fascicular block.  Incomplete right bundle branch block.  Normal QT interval.  No evidence of ischemia.  No significant change from EKG 05/01/2019: No present PVC (2).  Assessment     ICD-10-CM   1. Paroxysmal atrial fibrillation (HCC)  I48.0 EKG 12-Lead  2. Essential hypertension  I10   3. Obstructive sleep apnea  G47.33   4. Chronic diastolic heart failure (HCC)  I50.32     No orders of the defined types were placed in this encounter.   There are no discontinued medications.   Recommendations:   Darren [name]  is a 79 y.o. male  with moderate obesity paroxysmal atrial fibrillation and probably sinus node dysfunction with  sinus bradycardia even on minimal dose of beta-blocker, OSA on CPAP, hyperlipidemia, bronchial asthma and history of excessive alcohol intake presents for F/U OF ATRIAL FIBRILLATION. He was started on sotalol on 12/18/2018 which he is tolerating.  Prior to cardioversion, he had developed severe LV systolic dysfunction but repeat echocardiogram in  August 2020 revealed normalization of LVEF.  He is presently doing well and tolerating sotalol and maintaining sinus rhythm. Weight loss was discussed extensively. Continue anticoagulation.  He is compliant with his CPAP. BP is well controlled and his weight has been stable. He has not had recurrence of A. Fib.  He is tolerating sotalol, EKG reveals normal QT interval.  I do not have his recent labs with regard to renal function, will obtain this from his PCP.  With regard to chronic diastolic heart failure, he has not had any acute decompensation.  Leg edema is chronic and stable.  I will see him back in 6 months for follow-up.   [name], MD, [facility] 04/24/2020, 10:59 AM Office: [phone]

## 2020-04-26 ENCOUNTER — Ambulatory Visit: Payer: Medicare Other | Admitting: Cardiology

## 2020-04-26 NOTE — Telephone Encounter (Signed)
Labs 03/11/2020:  Total cholesterol 165, triglycerides 104, HDL 81, LDL 63.  Hb 14.4/HCT 45.0, platelets 178, normal indicis.  Sodium 141, potassium 104, BUN 16, creatinine 0.90, EGFR >60 mL.  A1c 6.1%.

## 2020-04-26 NOTE — Telephone Encounter (Signed)
From patient.

## 2020-05-17 ENCOUNTER — Other Ambulatory Visit: Payer: Self-pay | Admitting: Cardiology

## 2020-05-17 DIAGNOSIS — I480 Paroxysmal atrial fibrillation: Secondary | ICD-10-CM

## 2020-07-16 ENCOUNTER — Other Ambulatory Visit: Payer: Self-pay | Admitting: Cardiology

## 2020-07-16 DIAGNOSIS — I5042 Chronic combined systolic (congestive) and diastolic (congestive) heart failure: Secondary | ICD-10-CM

## 2020-08-13 ENCOUNTER — Other Ambulatory Visit: Payer: Self-pay | Admitting: Cardiology

## 2020-09-10 ENCOUNTER — Other Ambulatory Visit: Payer: Self-pay | Admitting: Cardiology

## 2020-09-10 DIAGNOSIS — I5042 Chronic combined systolic (congestive) and diastolic (congestive) heart failure: Secondary | ICD-10-CM

## 2020-10-08 ENCOUNTER — Other Ambulatory Visit: Payer: Self-pay | Admitting: Student

## 2020-10-08 DIAGNOSIS — I5042 Chronic combined systolic (congestive) and diastolic (congestive) heart failure: Secondary | ICD-10-CM

## 2020-10-14 ENCOUNTER — Other Ambulatory Visit: Payer: Self-pay | Admitting: Cardiology

## 2020-10-14 DIAGNOSIS — I5042 Chronic combined systolic (congestive) and diastolic (congestive) heart failure: Secondary | ICD-10-CM

## 2020-10-19 NOTE — Progress Notes (Signed)
Primary Physician/Referring:  [name], MD  Patient ID: Darren [name], male    DOB: 10/21/1940, 80 y.o.   MRN: [id number]  Chief Complaint  Patient presents with  . Atrial Fibrillation  . Follow-up    6 month  . Shortness of Breath   HPI:    Darren [name]  is a 80 y.o. male  with moderate obesity, paroxysmal atrial fibrillation and probably sinus node dysfunction with  sinus bradycardia even on minimal dose of beta-blocker, OSA on CPAP, hyperlipidemia, bronchial asthma and history of excessive alcohol intake.  Patient had developed severe LV systolic dysfunction, he was then started on sotalol on 12/18/2018 and echocardiogram in August 2020 following cardioversion revealed normalization of LVEF.  Patient presents for 25-monthfollow-up of atrial fibrillation.  Patient is doing well overall without specific complaints.  Denies chest pain, palpitations, syncope, near syncope, dizziness.  Denies orthopnea, PND.  He does continue to have dyspnea on exertion and bilateral lower leg edema which are chronic and stable.  He does not walk much due to knee pain and neuropathy, however he does go to the gym 2 to 3 days/week for strength training for approximately 30 minutes without issue.  Patient also continues to drink several liquor drinks per day.  Past Medical History:  Diagnosis Date  . Arthritis    "maybe a little"  . Asthma    "mild"  . Atrial fibrillation ([facility]   . bilateral leg edema 09/11/2018  . Cancer ([facility]    prostate cancer  . Dyspnea   . Dyspnea on exertion 09/11/2018  . Dysrhythmia    Afib with rapid ventricular  . Essential hypertension 07/25/2018  . Fatty liver   . GERD (gastroesophageal reflux disease)   . GI bleed 2018  . Gout   . History of blood transfusion 2018  . Obstructive sleep apnea 09/11/2018  . OSA (obstructive sleep apnea)   . Paroxysmal atrial fibrillation ([facility] 09/11/2018  . Pre-diabetes    Past Surgical History:  Procedure Laterality Date  .  CARDIOVERSION N/A 10/08/2018   Procedure: CARDIOVERSION;  Surgeon: GAdrian Prows MD;  [facility];  Service: Cardiovascular;  Laterality: N/A;  . CARDIOVERSION N/A 01/17/2019   Procedure: CARDIOVERSION;  Surgeon: GAdrian Prows MD;  [place] [facility]  Service: Cardiovascular;  Laterality: N/A;  . COLONOSCOPY Left 03/10/2017   Procedure: COLONOSCOPY;  Surgeon: OArta Silence MD;  [facility];  Service: Endoscopy;  Laterality: Left;  . ESOPHAGOGASTRODUODENOSCOPY (EGD) WITH PROPOFOL Left 03/09/2017   Procedure: ESOPHAGOGASTRODUODENOSCOPY (EGD) WITH PROPOFOL;  Surgeon: KRonnette Juniper MD;  [place] [facility]  Service: Gastroenterology;  Laterality: Left;  . EYE SURGERY Bilateral    cataract   . FINGER SURGERY Right    5th  . Prostate     DaVinci   Family History  Problem Relation Age of Onset  . Emphysema Father        smoked   Social History   Tobacco Use  . Smoking status: Former Smoker    Packs/day: 1.00    Years: 25.00    Pack years: 25.00    Quit date: 07/10/1988    Years since quitting: 32.3  . Smokeless tobacco: Never Used  Substance Use Topics  . Alcohol use: Yes    Alcohol/week: 25.0 standard drinks    Types: 25 Shots of liquor per week    Comment: Daily    Marital Status: Married  ROS  Review of Systems  Constitutional: Negative for malaise/fatigue and weight gain.  Cardiovascular: Positive for dyspnea on exertion (chronic stable ) and leg swelling (chronic and stable). Negative for chest pain, claudication, near-syncope, orthopnea, palpitations, paroxysmal nocturnal dyspnea and syncope.  Respiratory: Positive for snoring (on CPAP). Negative for shortness of breath.   Hematologic/Lymphatic: Does not bruise/bleed easily.  Musculoskeletal: Positive for joint pain.  Gastrointestinal: Negative for melena.  Neurological: Negative for dizziness and weakness.   Objective  Blood pressure 136/86, pulse 63, temperature 97.8 F (36.6 C), height 5' 11"   (1.803 m), weight 261 lb (118.4 kg), SpO2 96 %.  Vitals with BMI 10/20/2020 04/21/2020 02/11/2020  Height 5' 11"  5' 11"  5' 11"   Weight 261 lbs 262 lbs 262 lbs  BMI 36.42 94.58 59.29  Systolic 244 628 638  Diastolic 86 77 90  Pulse 63 68 68     Physical Exam Constitutional:      General: He is not in acute distress.    Appearance: He is well-developed.     Comments: Moderately obese  Neck:     Vascular: No JVD.  Cardiovascular:     Rate and Rhythm: Normal rate and regular rhythm.     Pulses:          Carotid pulses are 2+ on the right side and 2+ on the left side.      Dorsalis pedis pulses are 2+ on the right side and 2+ on the left side.       Posterior tibial pulses are 2+ on the right side and 2+ on the left side.     Heart sounds: Normal heart sounds. No murmur heard. No gallop.      Comments: Femoral and popliteal pulse difficult to feel due to patient's body habitus. No JVD Pulmonary:     Effort: Pulmonary effort is normal.     Breath sounds: Normal breath sounds. No wheezing, rhonchi or rales.  Abdominal:     General: Bowel sounds are normal.     Palpations: Abdomen is soft.     Comments: Obese  Musculoskeletal:        General: Normal range of motion.     Right lower leg: Edema (1-2+ pitting) present.     Left lower leg: Edema (1-2+ pitting ) present.  Skin:    General: Skin is warm and dry.    Laboratory examination:   No results for input(s): NA, K, CL, CO2, GLUCOSE, BUN, CREATININE, CALCIUM, GFRNONAA, GFRAA in the last 8760 hours. CrCl cannot be calculated (Patient's most recent lab result is older than the maximum 21 days allowed.).  CMP Latest Ref Rng & Units 01/17/2019 01/13/2019 12/03/2018  Glucose 70 - 99 mg/dL 97 96 132(H)  BUN 8 - 27 mg/dL - 32(H) 33(H)  Creatinine 0.76 - 1.27 mg/dL - 1.41(H) 1.24  Sodium 135 - 145 mmol/L 134(L) 135 144  Potassium 3.5 - 5.1 mmol/L 5.1 6.5(H) 5.0  Chloride 96 - 106 mmol/L - 99 106  CO2 20 - 29 mmol/L - 22 18(L)  Calcium  8.6 - 10.2 mg/dL - 9.6 9.1  Total Protein 6.5 - 8.1 g/dL - - -  Total Bilirubin 0.3 - 1.2 mg/dL - - -  Alkaline Phos 38 - 126 U/L - - -  AST 15 - 41 U/L - - -  ALT 17 - 63 U/L - - -   CBC Latest Ref Rng & Units 01/17/2019 10/08/2018 03/10/2017  WBC 4.0 - 10.5 K/uL - - -  Hemoglobin 13.0 - 17.0 g/dL 14.3 16.3 7.5(L)  Hematocrit 39.0 - 52.0 %  42.0 48.0 25.8(L)  Platelets 150 - 400 K/uL - - -   Lipid Panel  No results found for: CHOL, TRIG, HDL, CHOLHDL, VLDL, LDLCALC, LDLDIRECT HEMOGLOBIN A1C No results found for: HGBA1C, MPG TSH No results for input(s): TSH in the last 8760 hours.  External labs:  03/11/2020: Total cholesterol 165, triglycerides 104, HDL 81, LDL 63. Hb 14.4/HCT 45.0, platelets 178, normal indicis. Sodium 141, potassium 104, BUN 16, creatinine 0.90, EGFR >60 mL.  A1c 6.1%. 09/10/2018: HB 15.8/HCT 46.3, mild macrocytosis present with MCV of 100.  Platelet count 154.  Potassium 4.9, BUN 24, creatinine 1.1, eGFR greater than 60 mL.  ALT and AST elevated at 64 and 57 respectively.  Total cholesterol 184, triglycerides 199, HDL 85, LDL 59.  07/12/2017: HB 15.6, HCT 47.8, platelets 155.  Normal indicis.  Potassium 4.8, serum glucose 160 mg, BUN 23, creatinine 1.1.  CMP normal.  Total cholesterol 232, triglycerides 138, HDL 62, LDL 07/10/40.  Non-HDL cholesterol 170.  Uric acid is elevated at 7.8, vitamin D 25 x 1.  Medications and allergies   Allergies  Allergen Reactions  . Amlodipine     Leg edema  . Penicillins Hives    Has patient had a PCN reaction causing immediate rash, facial/tongue/throat swelling, SOB or lightheadedness with hypotension: No Has patient had a PCN reaction causing severe rash involving mucus membranes or skin necrosis: No Has patient had a PCN reaction that required hospitalization: No Has patient had a PCN reaction occurring within the last 10 years: No--childhood reaction If all of the above answers are "NO", then may proceed with Cephalosporin  use.    Current Outpatient Medications on File Prior to Visit  Medication Sig Dispense Refill  . allopurinol (ZYLOPRIM) 100 MG tablet Take 100 mg by mouth daily.     [name] ammonium lactate (AMLACTIN) 12 % cream Apply 1 g topically as needed for dry skin (scaly feet).    [name] atorvastatin (LIPITOR) 40 MG tablet Take 40 mg by mouth daily.     . Cholecalciferol (VITAMIN D-3 PO) Take 2,000 Units by mouth daily.     . colchicine 0.6 MG tablet Take 0.6 mg by mouth daily as needed (gout).     . colesevelam (WELCHOL) 625 MG tablet Take 1,875 mg by mouth 2 (two) times daily with a meal.     . ELIQUIS 5 MG TABS tablet TAKE 1 TABLET TWICE A DAY 60 tablet 11  . hydrALAZINE (APRESOLINE) 25 MG tablet TAKE 1 TABLET BY MOUTH 3 TIMES DAILY. 90 tablet 0  . isosorbide dinitrate (ISORDIL) 30 MG tablet Take 1 tablet (30 mg total) by mouth 3 (three) times daily. 90 tablet 2  . L-Methylfolate-Algae-B12-B6 (METANX) 3-90.314-2-35 MG CAPS Take 1 tablet by mouth 2 (two) times daily.     . Magnesium 400 MG TABS Take 400 mg by mouth daily at 12 noon.     . metoprolol tartrate (LOPRESSOR) 25 MG tablet TAKE 1 TABLET BY MOUTH 2 TIMES DAILY. 60 tablet 3  . mometasone-formoterol (DULERA) 100-5 MCG/ACT AERO Inhale 2 puffs into the lungs 2 (two) times daily. 39 g 3  . montelukast (SINGULAIR) 10 MG tablet TAKE 1 TABLET BY MOUTH AT BEDTIME 30 tablet 11  . omeprazole (PRILOSEC) 20 MG capsule Take 20 mg by mouth 2 (two) times daily before a meal.    . Polyethyl Glycol-Propyl Glycol (SYSTANE OP) Place 1 drop into both eyes 3 (three) times daily as needed (dry eyes).    . sotalol (BETAPACE)  80 MG tablet Take 0.5 tablets (40 mg total) by mouth 2 (two) times daily. 45 tablet 3  . telmisartan (MICARDIS) 80 MG tablet Take 80 mg by mouth daily.    [name] thiamine (VITAMIN B-1) 100 MG tablet Take 100 mg by mouth daily.     No current facility-administered medications on file prior to visit.    Radiology:  No results found.  Cardiac Studies:    Lexiscan myoview stress test 09/20/2018:  1. Lexiscan stress test was performed. Exercise capacity was not assessed. Stress symptoms included dizziness. Resting blood pressure was 164/108 mmHg and peak effect blood pressure was 174/116 mmHg. The resting and stress electrocardiogram demonstrated atrial fibrillation with rapid ventricular rate, occasional PVC, and normal rest repolarization.  Stress EKG is non diagnostic for ischemia as it is a pharmacologic stress. In addition, no ischemic changes seen at heart rate of 136 bpm.  2. The overall quality of the study is good.  Left ventricular cavity is noted to be enlarged on the rest and stress studies.  Gated SPECT images reveal moderate global decrease in myocardial thickening and wall motion.  The left ventricular ejection fraction was calculated or visually estimated to be 34%.  REST and STRESS images demonstrate mildly decreased tracer uptake in the basal inferior, mid inferior and apical inferior segments of the left ventricle, with no reversibility. Findings suggestive of dilated nonishcemic cardiomyopathy.  3. High risk study due to reduced LVED. Recommend clinical correlation.   Echocardiogram 02/11/2019:  Left ventricle cavity is mildly dilated. Mild concentric hypertrophy of  the left ventricle. Normal global wall motion. Normal LV systolic function  with EF 54%. Normal diastolic filling pattern.  Left atrial cavity is mildly dilated.  Mild (Grade I) mitral regurgitation.  Mild pulmonic regurgitation.  Compared to previous study on 09/23/2018, there is significant improvement in LVEF from 30% and severity of mitral regurgitation.  EKG:   10/20/2020: Sinus rhythm with first-degree AV block and single PVC at a rate of 64 bpm.  Left axis, left anterior fascicular block.  Incomplete right bundle branch block.  Compared to EKG 05/08/2020, no significant change.  EKG 10/30/2019: Normal sinus rhythm at the rate of 60 bpm, left axis deviation,  left anterior fascicular block.  Incomplete right bundle branch block.  Normal QT interval.  No evidence of ischemia.  No significant change from EKG 05/01/2019: No present PVC (2).  Assessment     ICD-10-CM   1. Paroxysmal atrial fibrillation (HCC)  I48.0 EKG 12-Lead  2. Essential hypertension  I10   3. Chronic diastolic heart failure (HCC)  I50.32     No orders of the defined types were placed in this encounter.   There are no discontinued medications.   This patients CHA2DS2-VASc Score 3 (Age, HF) and yearly risk of stroke 3.2%.   Recommendations:   Darren [name]  is a 80 y.o. male  with moderate obesity, paroxysmal atrial fibrillation and probably sinus node dysfunction with  sinus bradycardia even on minimal dose of beta-blocker, OSA on CPAP, hyperlipidemia, bronchial asthma and history of excessive alcohol intake.  Patient had developed severe LV systolic dysfunction, he was then started on sotalol on 12/18/2018 and echocardiogram in August 2020 following cardioversion revealed normalization of LVEF.  Patient presents for 21-monthfollow-up of atrial fibrillation.  Patient is presently doing well without specific complaints and no known recurrence of atrial fibrillation.  He continues to tolerate sotalol without issue and is maintaining sinus rhythm.  He tolerates anticoagulation without bleeding  diathesis.  I personally reviewed external records, lipids are well controlled.  Patient continues to follow closely with PCP regarding prediabetes.  Patient remains compliant with CPAP.  His symptoms of dyspnea on exertion and leg edema are chronic and remained stable.  Again counseled patient regarding weight loss and reducing alcohol intake.  Otherwise patient is stable from a cardiovascular standpoint.Follow-up in 6 months, sooner if needed, for atrial fibrillation and hyperlipidemia.   Alethia Berthold, PA-C 10/22/2020, 12:38 PM Office: [phone]

## 2020-10-20 ENCOUNTER — Encounter: Payer: Self-pay | Admitting: Student

## 2020-10-20 ENCOUNTER — Ambulatory Visit: Payer: Medicare Other | Admitting: Student

## 2020-10-20 ENCOUNTER — Ambulatory Visit: Payer: Medicare Other | Admitting: Cardiology

## 2020-10-20 ENCOUNTER — Other Ambulatory Visit: Payer: Self-pay

## 2020-10-20 VITALS — BP 136/86 | HR 63 | Temp 97.8°F | Ht 71.0 in | Wt 261.0 lb

## 2020-10-20 DIAGNOSIS — I480 Paroxysmal atrial fibrillation: Secondary | ICD-10-CM

## 2020-10-20 DIAGNOSIS — I5032 Chronic diastolic (congestive) heart failure: Secondary | ICD-10-CM

## 2020-10-20 DIAGNOSIS — I10 Essential (primary) hypertension: Secondary | ICD-10-CM

## 2020-11-05 ENCOUNTER — Other Ambulatory Visit: Payer: Self-pay | Admitting: Cardiology

## 2020-11-05 DIAGNOSIS — I480 Paroxysmal atrial fibrillation: Secondary | ICD-10-CM

## 2020-11-30 ENCOUNTER — Other Ambulatory Visit: Payer: Self-pay | Admitting: Cardiology

## 2020-11-30 DIAGNOSIS — I5042 Chronic combined systolic (congestive) and diastolic (congestive) heart failure: Secondary | ICD-10-CM

## 2020-12-10 ENCOUNTER — Other Ambulatory Visit: Payer: Self-pay | Admitting: Cardiology

## 2020-12-10 DIAGNOSIS — I5042 Chronic combined systolic (congestive) and diastolic (congestive) heart failure: Secondary | ICD-10-CM

## 2020-12-23 ENCOUNTER — Other Ambulatory Visit: Payer: Self-pay | Admitting: Internal Medicine

## 2020-12-24 NOTE — Telephone Encounter (Signed)
Darren [name] is not on the patient's preferred list. Advair is suggested.    Patient has tried Advair 115-21 mcg 04/07/2016  Dr. Melvyn Novas please advise   Thank you

## 2020-12-24 NOTE — Telephone Encounter (Signed)
Dulera 100 is fine x 3 months but needs ov before more  Rx inhaled corticosteroids Take 2 puffs first thing in am and then another 2 puffs about 12 hours later.   d  If dulera 100 not covered then return sooner with formulary in hand

## 2020-12-24 NOTE — Telephone Encounter (Signed)
Pt is requesting Dulera, I am not finding that in his last office not, however I do see Symbicort mentioned. Would you like to refill this?

## 2020-12-27 NOTE — Telephone Encounter (Signed)
Can try PA for dulera if the only option is Advair 115 and already tried and failed it - otherwise suggest just do the advair Take 2 puffs first thing in am and then another 2 puffs about 12 hours later and set up f/u ov with formulary in hand

## 2021-01-28 ENCOUNTER — Other Ambulatory Visit: Payer: Self-pay | Admitting: Student

## 2021-01-28 DIAGNOSIS — I5042 Chronic combined systolic (congestive) and diastolic (congestive) heart failure: Secondary | ICD-10-CM

## 2021-02-10 ENCOUNTER — Other Ambulatory Visit: Payer: Self-pay

## 2021-02-10 ENCOUNTER — Encounter: Payer: Self-pay | Admitting: Internal Medicine

## 2021-02-10 ENCOUNTER — Ambulatory Visit (INDEPENDENT_AMBULATORY_CARE_PROVIDER_SITE_OTHER): Payer: Medicare Other | Admitting: Internal Medicine

## 2021-02-10 DIAGNOSIS — J45991 Cough variant asthma: Secondary | ICD-10-CM

## 2021-02-10 MED ORDER — GABAPENTIN 100 MG PO CAPS: 100.0000 mg | ORAL_CAPSULE | Freq: Four times a day (QID) | ORAL | 2 refills | Status: DC

## 2021-02-10 NOTE — Assessment & Plan Note (Addendum)
Onset around 2005  FENO 03/30/2016  =   47 on just saba  - 03/30/2016  After extensive coaching HFA effectiveness =    90% :  Try symbicort 80 2bid x 6 weeks then return with repeat spirometry >  04/06/2016 notified insurance prefers advair > try 115 hfa 2 bid   - Allergy profile 05/10/2016 >  Eos 0.4 /  IgE 60 Pos Grass/ cedar trees   - 05/10/2016 changed back to 4 weeks of symb 80 p flared on advair - FENO 06/07/2016  =   10 on symb 80 2bid  - Spirometry 06/07/2016  FEV1 1.89 (58%)  Ratio  71   - 09/06/2016   try dulera 200 2bid - 11/28/2016 excess shaking on duelra 200 2bid so try one bid and off gerd rx since is not convinced worked - Sinus CT 11/29/2016 >>> Mucosal edema in the maxillary and ethmoid sinuses bilaterally. No air-fluid level. - Singulair 10 mg started first week in Sept 2018   - 07/17/2017   rechallenge with the dulera 100  - 07/24/2018  After extensive coaching inhaler device,  effectiveness =    90% but some "congestion" since started labetolol  Complicated by hbp/ osa /hyperlipidemia    All goals of chronic asthma control met including optimal function and elimination of symptoms with minimal need for rescue therapy.  Contingencies discussed in full including contacting this office immediately if not controlling the symptoms using the rule of two's.     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

## 2021-02-10 NOTE — Patient Instructions (Signed)
Gabapetin 100 mg one at bedtime for a week, then twice daily for a week then 3 x daily x for a week then 4 x daily    Please schedule a follow up visit in 3 months but call sooner if needed

## 2021-02-10 NOTE — Assessment & Plan Note (Signed)
Complicated by hbp/ osa /hyperlipidemia   Body mass index is 36.76 kg/m.  -  trending up sill  No results found for: TSH    Contributing to doe and risk of GERD >>>   reviewed the need and the process to achieve and maintain neg calorie balance > defer f/u primary care including intermittently monitoring thyroid status          Each maintenance medication was reviewed in detail including emphasizing most importantly the difference between maintenance and prns and under what circumstances the prns are to be triggered using an action plan format where appropriate.  Total time for H and P, chart review, counseling, reviewing hfa device(s) and generating customized AVS unique to this office visit / same day charting = 64mn

## 2021-02-10 NOTE — Progress Notes (Signed)
Subjective:   Patient ID: Darren [name], male    DOB: 1941-07-06   MRN: [id number]    Brief patient profile:  79    yowm quit smoking 1990 with cough that resolved and pattern of recurrent rhinitis since childhood spring > fall with onset of recurrent flares of cough / wheezing around 2005 resolves from days to weeks p prednisone cycle needing up to 4 x on avg  referred to pulmonary clinic 03/30/2016 by Dr   Dr Mellody Drown    History of Present Illness  03/30/2016 1st [facility] office visit/ Bernie Fobes   Chief Complaint  Patient presents with   Pulmonary Consult    Referred by Dr. [name]. Pt c/o cough and wheezing off and on "for years".    last prednisone was 15 days one week prior to OV  Back to baseline = doe x MMRC1 = can walk nl pace, flat grade, can't hurry or go uphills or steps s sob  On proair once each am  rec Plan A = Automatic = Symbicort 80 Take 2 puffs first thing in am and then another 2 puffs about 12 hours later.  Plan B = Backup Only use your albuterol as a rescue medication          07/17/2017  f/u ov/Ski Polich re:  AB/ rhinitis with pnds is main concern Chief Complaint  Patient presents with   Follow-up    Pt states still having to clear his throat, worse over the past 2 months.   cough does not wake him up/ sleeps ok on cpap and w/in an hour feels urge to clear throat but even then < a tbsp of mucus  clariton not effective, has not tried zyrtec or 1st gen H1 yet  Doe still = MMRC2 / not needing saba  rec Try zyrtec 10 mg at bedtime as needed for drippy nose  (available over the counter) - also can try For drainage / throat tickle try take CHLORPHENIRAMINE  4 mg - take one every 4 hours as needed -   Try dulera 100 Take 2 puffs first thing in am and then another 2 puffs about 12 hours later.  Work on maintaining perfect  inhaler technique:      02/11/2020  f/u ov/Lil Lepage re: AB better on dulera 100 bid / singulair  Chief Complaint  Patient presents with    Follow-up    c/o dyspnea and leg swelling x2 months  Dyspnea:  Starting to work out again, not checking sats  Cough: minimal Sleeping: on side/ bed is flat/ wedge pillow / cpap per  [place]  SABA use: none  02: none  Rec Make sure you check your oxygen saturations at highest level of activity     02/10/2021  f/u ov/Aurelia Gras re: AB on dulera 100 2bid and singulair  Chief Complaint  Patient presents with   Follow-up    F/u on asthma. Breathing well, using cpap while sleeping.   Dyspnea:  goes to gym 2-4 x per week s sob, not checking wts  Cough: some pnds/ clear mucus constant daytime throat clearing worse  Sleeping: on left side bed is flat with wedge pillow and cpap  tol fine s noct resp cc  SABA use: none 02: none Covid status:   vax x  3    No obvious day to day or daytime variability or assoc excess/ purulent sputum or mucus plugs or hemoptysis or cp or chest tightness, subjective wheeze or overt sinus or  hb symptoms.   Sleeping as abover  without nocturnal  or early am exacerbation  of respiratory  c/o's or need for noct saba. Also denies any obvious fluctuation of symptoms with weather or environmental changes or other aggravating or alleviating factors except as outlined above   No unusual exposure hx or h/o childhood pna/ asthma or knowledge of premature birth.  Current Allergies, Complete Past Medical History, Past Surgical History, Family History, and Social History were reviewed in [employer] record.  ROS  The following are not active complaints unless bolded Hoarseness, sore throat, dysphagia, dental problems, itching, sneezing,  nasal congestion or discharge of excess mucus or purulent secretions, ear ache,   fever, chills, sweats, unintended wt loss or wt gain, classically pleuritic or exertional cp,  orthopnea pnd or arm/hand swelling  or leg swelling, presyncope, palpitations, abdominal pain, anorexia, nausea, vomiting, diarrhea  or change in bowel  habits or change in bladder habits, change in stools or change in urine, dysuria, hematuria,  rash, arthralgias, visual complaints, headache, numbness, weakness or ataxia or problems with walking or coordination,  change in mood or  memory.        Current Meds  Medication Sig   allopurinol (ZYLOPRIM) 100 MG tablet Take 100 mg by mouth daily.    ammonium lactate (AMLACTIN) 12 % cream Apply 1 g topically as needed for dry skin (scaly feet).   atorvastatin (LIPITOR) 40 MG tablet Take 40 mg by mouth daily.    Cholecalciferol (VITAMIN D-3 PO) Take 2,000 Units by mouth daily.    colchicine 0.6 MG tablet Take 0.6 mg by mouth daily as needed (gout).    colesevelam (WELCHOL) 625 MG tablet Take 1,875 mg by mouth 2 (two) times daily with a meal.    ELIQUIS 5 MG TABS tablet TAKE 1 TABLET TWICE A DAY   hydrALAZINE (APRESOLINE) 25 MG tablet TAKE 1 TABLET BY MOUTH 3 TIMES DAILY.   isosorbide dinitrate (ISORDIL) 30 MG tablet Take 1 tablet (30 mg total) by mouth 3 (three) times daily.   L-Methylfolate-Algae-B12-B6 (METANX) 3-90.314-2-35 MG CAPS Take 1 tablet by mouth 2 (two) times daily.    Magnesium 400 MG TABS Take 400 mg by mouth daily at 12 noon.    metoprolol tartrate (LOPRESSOR) 25 MG tablet TAKE 1 TABLET BY MOUTH 2 TIMES DAILY.   mometasone-formoterol (DULERA) 100-5 MCG/ACT AERO USE 2 INHALATIONS TWICE A DAY   montelukast (SINGULAIR) 10 MG tablet TAKE 1 TABLET BY MOUTH AT BEDTIME   omeprazole (PRILOSEC) 20 MG capsule Take 20 mg by mouth 2 (two) times daily before a meal.   Polyethyl Glycol-Propyl Glycol (SYSTANE OP) Place 1 drop into both eyes 3 (three) times daily as needed (dry eyes).   sotalol (BETAPACE) 80 MG tablet TAKE 1/2 TABLET (40 MG TOTAL) BY MOUTH 2 TIMES DAILY.   telmisartan (MICARDIS) 80 MG tablet Take 80 mg by mouth daily.   thiamine (VITAMIN B-1) 100 MG tablet Take 100 mg by mouth daily.                     Objective:   Physical Exam    02/10/2021         263  02/11/2020          262  07/24/2018       262  01/21/2018       265  07/17/2017          258  03/29/2017  241 01/09/2017          263  11/28/2016        267  09/06/16         273   06/07/16 267 lb (121.1 kg)  05/10/16 264 lb (119.7 kg)  03/30/16 261 lb (118.4 kg)         Vital signs reviewed  02/10/2021  - Note at rest 02 sats  95% on RA   General appearance:    obese wm vigorous throat clearing    HEENT : pt wearing mask not removed for exam due to covid -19 concerns.    NECK :  without JVD/Nodes/TM/ nl carotid upstrokes bilaterally   LUNGS: no acc muscle use,  Nl contour chest which is clear to A and P bilaterally without cough on insp or exp maneuvers   CV:  RRR  no s3 or murmur or increase in P2, and 1+ LE pitting  edema   ABD: quite obese but   soft and nontender with nl inspiratory excursion in the supine position. No bruits or organomegaly appreciated, bowel sounds nl  MS:  Nl gait/ ext warm without deformities, calf tenderness, cyanosis or clubbing No obvious joint restrictions   SKIN: warm and dry without lesions    NEURO:  alert, approp, nl sensorium with  no motor or cerebellar deficits apparent.          Assessment & Plan:

## 2021-02-11 ENCOUNTER — Other Ambulatory Visit: Payer: Self-pay | Admitting: Internal Medicine

## 2021-03-23 ENCOUNTER — Other Ambulatory Visit: Payer: Self-pay | Admitting: Internal Medicine

## 2021-03-25 ENCOUNTER — Other Ambulatory Visit: Payer: Self-pay | Admitting: Student

## 2021-03-25 DIAGNOSIS — I5042 Chronic combined systolic (congestive) and diastolic (congestive) heart failure: Secondary | ICD-10-CM

## 2021-03-29 ENCOUNTER — Other Ambulatory Visit: Payer: Self-pay | Admitting: Cardiology

## 2021-03-29 DIAGNOSIS — I480 Paroxysmal atrial fibrillation: Secondary | ICD-10-CM

## 2021-04-21 ENCOUNTER — Other Ambulatory Visit: Payer: Self-pay

## 2021-04-21 ENCOUNTER — Ambulatory Visit: Payer: Medicare Other | Admitting: Student

## 2021-04-21 ENCOUNTER — Encounter: Payer: Self-pay | Admitting: Student

## 2021-04-21 VITALS — BP 135/66 | HR 63 | Temp 98.0°F | Resp 16 | Ht 71.0 in | Wt 265.0 lb

## 2021-04-21 DIAGNOSIS — I10 Essential (primary) hypertension: Secondary | ICD-10-CM

## 2021-04-21 DIAGNOSIS — I5032 Chronic diastolic (congestive) heart failure: Secondary | ICD-10-CM

## 2021-04-21 DIAGNOSIS — I480 Paroxysmal atrial fibrillation: Secondary | ICD-10-CM

## 2021-04-21 NOTE — Progress Notes (Signed)
Primary Physician/Referring:  [name], MD  Patient ID: Darren [name], male    DOB: 1941-05-24, 80 y.o.   MRN: [id number]  Chief Complaint  Patient presents with   Hyperlipidemia   Follow-up    6 month   Hypertension   HPI:    [name]  is a 80 y.o. male  with moderate obesity, paroxysmal atrial fibrillation and probably sinus node dysfunction with  sinus bradycardia even on minimal dose of beta-blocker, OSA on CPAP, hyperlipidemia, bronchial asthma and history of excessive alcohol intake.  Patient had developed severe LV systolic dysfunction, he was then started on sotalol on 12/18/2018 and echocardiogram in August 2020 following cardioversion revealed normalization of LVEF.  Patient presents for 6 follow-up.  Last office visit patient was stable, therefore no changes were made.  Patient is doing relatively well, however he does report occasional episodes of "fullness" in his chest.  These episodes typically occur at rest.  He continues to exercise regularly at the gym doing resistance training but limited cardio.  He has no chest discomfort with these exercises.  Denies palpitations, syncope, near syncope, dizziness, orthopnea, PND.  He does continue to have bilateral lower leg edema which is chronic and stable.  Unfortunately he does continue to drink several alcoholic beverages each night.  Patient monitors his heart rhythm at home and has had no known recurrence of atrial fibrillation.  Past Medical History:  Diagnosis Date   Arthritis    "maybe a little"   Asthma    "mild"   Atrial fibrillation ([place])    bilateral leg edema 09/11/2018   Cancer ([place])    prostate cancer   Dyspnea    Dyspnea on exertion 09/11/2018   Dysrhythmia    Afib with rapid ventricular   Essential hypertension 07/25/2018   Fatty liver    GERD (gastroesophageal reflux disease)    GI bleed 2018   Gout    History of blood transfusion 2018   Obstructive sleep apnea 09/11/2018   OSA (obstructive sleep  apnea)    Paroxysmal atrial fibrillation ([place]) 09/11/2018   Pre-diabetes    Past Surgical History:  Procedure Laterality Date   CARDIOVERSION N/A 10/08/2018   Procedure: CARDIOVERSION;  Surgeon: [name], MD;  Location: [facility];  Service: Cardiovascular;  Laterality: N/A;   CARDIOVERSION N/A 01/17/2019   Procedure: CARDIOVERSION;  Surgeon: [name], MD;  Location: [facility];  Service: Cardiovascular;  Laterality: N/A;   COLONOSCOPY Left 03/10/2017   Procedure: COLONOSCOPY;  Surgeon: Arta Silence, MD;  Location: [facility];  Service: Endoscopy;  Laterality: Left;   ESOPHAGOGASTRODUODENOSCOPY (EGD) WITH PROPOFOL Left 03/09/2017   Procedure: ESOPHAGOGASTRODUODENOSCOPY (EGD) WITH PROPOFOL;  Surgeon: Ronnette Juniper, MD;  Location: [facility];  Service: Gastroenterology;  Laterality: Left;   EYE SURGERY Bilateral    cataract    FINGER SURGERY Right    5th   Prostate     [facility]   Family History  Problem Relation Age of Onset   Emphysema Father        smoked   Social History   Tobacco Use   Smoking status: Former    Packs/day: 1.00    Years: 25.00    Pack years: 25.00    Types: Cigarettes    Start date: 76    Quit date: 07/10/1988    Years since quitting: 32.8   Smokeless tobacco: Never  Substance Use Topics   Alcohol use: Yes    Alcohol/week: 25.0 standard drinks  Types: 25 Shots of liquor per week    Comment: Daily    Marital Status: Married  ROS  Review of Systems  Cardiovascular:  Positive for dyspnea on exertion (chronic stable ) and leg swelling (chronic and stable). Negative for chest pain, claudication, near-syncope, orthopnea, palpitations, paroxysmal nocturnal dyspnea and syncope.  Respiratory:  Positive for snoring (on CPAP). Negative for shortness of breath.   Hematologic/Lymphatic: Does not bruise/bleed easily.  Musculoskeletal:  Positive for joint pain.  Neurological:  Negative for dizziness and weakness.  Objective  Blood pressure 135/66, pulse  63, temperature 98 F (36.7 C), resp. rate 16, height 5' 11"  (1.803 m), weight 265 lb (120.2 kg), SpO2 94 %.  Vitals with BMI 04/21/2021 04/21/2021 02/10/2021  Height - 5' 11"  5' 11"   Weight - 265 lbs 263 lbs 10 oz  BMI - 03.55 97.41  Systolic [phone]  Diastolic 66 74 62  Pulse 63 70 73     Physical Exam Vitals reviewed.  Constitutional:      General: He is not in acute distress.    Appearance: He is well-developed.     Comments: Moderately obese  Neck:     Vascular: No JVD.  Cardiovascular:     Rate and Rhythm: Normal rate and regular rhythm.     Pulses:          Carotid pulses are 2+ on the right side and 2+ on the left side.      Dorsalis pedis pulses are 2+ on the right side and 2+ on the left side.       Posterior tibial pulses are 2+ on the right side and 2+ on the left side.     Heart sounds: Normal heart sounds. No murmur heard.   No gallop.     Comments: Femoral and popliteal pulse difficult to feel due to patient's body habitus. No JVD Pulmonary:     Effort: Pulmonary effort is normal.     Breath sounds: Normal breath sounds. No wheezing, rhonchi or rales.  Abdominal:     Comments: Obese  Musculoskeletal:        General: Normal range of motion.     Right lower leg: Edema (1-2+ pitting) present.     Left lower leg: Edema (1-2+ pitting ) present.  Skin:    General: Skin is warm and dry.  Neurological:     Mental Status: He is alert.   Laboratory examination:   No results for input(s): NA, K, CL, CO2, GLUCOSE, BUN, CREATININE, CALCIUM, GFRNONAA, GFRAA in the last 8760 hours. CrCl cannot be calculated (Patient's most recent lab result is older than the maximum 21 days allowed.).  CMP Latest Ref Rng & Units 01/17/2019 01/13/2019 12/03/2018  Glucose 70 - 99 mg/dL 97 96 132(H)  BUN 8 - 27 mg/dL - 32(H) 33(H)  Creatinine 0.76 - 1.27 mg/dL - 1.41(H) 1.24  Sodium 135 - 145 mmol/L 134(L) 135 144  Potassium 3.5 - 5.1 mmol/L 5.1 6.5(H) 5.0  Chloride 96 - 106 mmol/L - 99  106  CO2 20 - 29 mmol/L - 22 18(L)  Calcium 8.6 - 10.2 mg/dL - 9.6 9.1  Total Protein 6.5 - 8.1 g/dL - - -  Total Bilirubin 0.3 - 1.2 mg/dL - - -  Alkaline Phos 38 - 126 U/L - - -  AST 15 - 41 U/L - - -  ALT 17 - 63 U/L - - -   CBC Latest Ref Rng & Units 01/17/2019 10/08/2018 03/10/2017  WBC 4.0 - 10.5 K/uL - - -  Hemoglobin 13.0 - 17.0 g/dL 14.3 16.3 7.5(L)  Hematocrit 39.0 - 52.0 % 42.0 48.0 25.8(L)  Platelets 150 - 400 K/uL - - -   Lipid Panel  No results found for: CHOL, TRIG, HDL, CHOLHDL, VLDL, LDLCALC, LDLDIRECT HEMOGLOBIN A1C No results found for: HGBA1C, MPG TSH No results for input(s): TSH in the last 8760 hours.  External labs:  03/11/2020: Total cholesterol 165, triglycerides 104, HDL 81, LDL 63. Hb 14.4/HCT 45.0, platelets 178, normal indicis. Sodium 141, potassium 104, BUN 16, creatinine 0.90, EGFR >60 mL.  A1c 6.1%. 09/10/2018: HB 15.8/HCT 46.3, mild macrocytosis present with MCV of 100.  Platelet count 154.  Potassium 4.9, BUN 24, creatinine 1.1, eGFR greater than 60 mL.  ALT and AST elevated at 64 and 57 respectively.  Total cholesterol 184, triglycerides 199, HDL 85, LDL 59.  07/12/2017: HB 15.6, HCT 47.8, platelets 155.  Normal indicis.  Potassium 4.8, serum glucose 160 mg, BUN 23, creatinine 1.1.  CMP normal.  Total cholesterol 232, triglycerides 138, HDL 62, LDL 07/10/40.  Non-HDL cholesterol 170.  Uric acid is elevated at 7.8, vitamin D 25 x 1.  Allergies   Allergies  Allergen Reactions   Amlodipine     Leg edema   Penicillins Hives    Has patient had a PCN reaction causing immediate rash, facial/tongue/throat swelling, SOB or lightheadedness with hypotension: No Has patient had a PCN reaction causing severe rash involving mucus membranes or skin necrosis: No Has patient had a PCN reaction that required hospitalization: No Has patient had a PCN reaction occurring within the last 10 years: No--childhood reaction If all of the above answers are "NO", then may  proceed with Cephalosporin use.      Medications Prior to Visit:   Outpatient Medications Prior to Visit  Medication Sig Dispense Refill   allopurinol (ZYLOPRIM) 100 MG tablet Take 100 mg by mouth daily.      ammonium lactate (AMLACTIN) 12 % cream Apply 1 g topically as needed for dry skin (scaly feet).     atorvastatin (LIPITOR) 40 MG tablet Take 40 mg by mouth daily.      Cholecalciferol (VITAMIN D-3 PO) Take 2,000 Units by mouth daily.      colchicine 0.6 MG tablet Take 0.6 mg by mouth daily as needed (gout).      colesevelam (WELCHOL) 625 MG tablet Take 1,875 mg by mouth 2 (two) times daily with a meal.      DULERA 100-5 MCG/ACT AERO USE 2 INHALATIONS TWICE A DAY 39 g 3   ELIQUIS 5 MG TABS tablet TAKE 1 TABLET TWICE A DAY 60 tablet 11   gabapentin (NEURONTIN) 100 MG capsule Take 1 capsule (100 mg total) by mouth 4 (four) times daily. One three times daily 120 capsule 2   hydrALAZINE (APRESOLINE) 25 MG tablet TAKE 1 TABLET BY MOUTH 3 TIMES DAILY. 90 tablet 1   L-Methylfolate-Algae-B12-B6 (METANX) 3-90.314-2-35 MG CAPS Take 1 tablet by mouth 2 (two) times daily.      Magnesium 400 MG TABS Take 400 mg by mouth daily at 12 noon.      metoprolol tartrate (LOPRESSOR) 25 MG tablet TAKE 1 TABLET BY MOUTH 2 TIMES DAILY. 60 tablet 3   montelukast (SINGULAIR) 10 MG tablet TAKE 1 TABLET BY MOUTH AT BEDTIME 30 tablet 11   omeprazole (PRILOSEC) 20 MG capsule Take 20 mg by mouth 2 (two) times daily before a meal.     Polyethyl  Glycol-Propyl Glycol (SYSTANE OP) Place 1 drop into both eyes 3 (three) times daily as needed (dry eyes).     sotalol (BETAPACE) 80 MG tablet TAKE 1/2 TABLET (40 MG TOTAL) BY MOUTH 2 TIMES DAILY. 45 tablet 3   telmisartan (MICARDIS) 80 MG tablet Take 80 mg by mouth daily.     thiamine (VITAMIN B-1) 100 MG tablet Take 100 mg by mouth daily.     isosorbide dinitrate (ISORDIL) 30 MG tablet Take 1 tablet (30 mg total) by mouth 3 (three) times daily. 90 tablet 2   No  facility-administered medications prior to visit.   Final Medications at End of Visit    Current Meds  Medication Sig   allopurinol (ZYLOPRIM) 100 MG tablet Take 100 mg by mouth daily.    ammonium lactate (AMLACTIN) 12 % cream Apply 1 g topically as needed for dry skin (scaly feet).   atorvastatin (LIPITOR) 40 MG tablet Take 40 mg by mouth daily.    Cholecalciferol (VITAMIN D-3 PO) Take 2,000 Units by mouth daily.    colchicine 0.6 MG tablet Take 0.6 mg by mouth daily as needed (gout).    colesevelam (WELCHOL) 625 MG tablet Take 1,875 mg by mouth 2 (two) times daily with a meal.    DULERA 100-5 MCG/ACT AERO USE 2 INHALATIONS TWICE A DAY   ELIQUIS 5 MG TABS tablet TAKE 1 TABLET TWICE A DAY   gabapentin (NEURONTIN) 100 MG capsule Take 1 capsule (100 mg total) by mouth 4 (four) times daily. One three times daily   hydrALAZINE (APRESOLINE) 25 MG tablet TAKE 1 TABLET BY MOUTH 3 TIMES DAILY.   L-Methylfolate-Algae-B12-B6 (METANX) 3-90.314-2-35 MG CAPS Take 1 tablet by mouth 2 (two) times daily.    Magnesium 400 MG TABS Take 400 mg by mouth daily at 12 noon.    metoprolol tartrate (LOPRESSOR) 25 MG tablet TAKE 1 TABLET BY MOUTH 2 TIMES DAILY.   montelukast (SINGULAIR) 10 MG tablet TAKE 1 TABLET BY MOUTH AT BEDTIME   omeprazole (PRILOSEC) 20 MG capsule Take 20 mg by mouth 2 (two) times daily before a meal.   Polyethyl Glycol-Propyl Glycol (SYSTANE OP) Place 1 drop into both eyes 3 (three) times daily as needed (dry eyes).   sotalol (BETAPACE) 80 MG tablet TAKE 1/2 TABLET (40 MG TOTAL) BY MOUTH 2 TIMES DAILY.   telmisartan (MICARDIS) 80 MG tablet Take 80 mg by mouth daily.   thiamine (VITAMIN B-1) 100 MG tablet Take 100 mg by mouth daily.    Radiology:  No results found.  Cardiac Studies:   Lexiscan myoview stress test 09/20/2018:  1. Lexiscan stress test was performed. Exercise capacity was not assessed. Stress symptoms included dizziness. Resting blood pressure was 164/108 mmHg and peak  effect blood pressure was 174/116 mmHg. The resting and stress electrocardiogram demonstrated atrial fibrillation with rapid ventricular rate, occasional PVC, and normal rest repolarization.  Stress EKG is non diagnostic for ischemia as it is a pharmacologic stress. In addition, no ischemic changes seen at heart rate of 136 bpm.  2. The overall quality of the study is good.  Left ventricular cavity is noted to be enlarged on the rest and stress studies.  Gated SPECT images reveal moderate global decrease in myocardial thickening and wall motion.  The left ventricular ejection fraction was calculated or visually estimated to be 34%.  REST and STRESS images demonstrate mildly decreased tracer uptake in the basal inferior, mid inferior and apical inferior segments of the left ventricle, with no reversibility.  Findings suggestive of dilated nonishcemic cardiomyopathy.  3. High risk study due to reduced LVED. Recommend clinical correlation.   Echocardiogram 02/11/2019:  Left ventricle cavity is mildly dilated. Mild concentric hypertrophy of  the left ventricle. Normal global wall motion. Normal LV systolic function  with EF 54%. Normal diastolic filling pattern.  Left atrial cavity is mildly dilated.  Mild (Grade I) mitral regurgitation.  Mild pulmonic regurgitation.  Compared to previous study on 09/23/2018, there is significant improvement in LVEF from 30% and severity of mitral regurgitation.  EKG:  04/21/2021: Sinus rhythm at a rate of 75 bpm with frequent PVCs and quadrimeny pattern.  Left axis, left anterior fascicular block.  Incomplete right bundle branch block.  Compared to EKG 10/20/2020, no significant change.  EKG 10/30/2019: Normal sinus rhythm at the rate of 60 bpm, left axis deviation, left anterior fascicular block.  Incomplete right bundle branch block.  Normal QT interval.  No evidence of ischemia.  No significant change from EKG 05/01/2019: No present PVC (2).  Assessment      ICD-10-CM   1. Paroxysmal atrial fibrillation (HCC)  I48.0 EKG 12-Lead    2. Essential hypertension  M27 Basic metabolic panel    Brain natriuretic peptide    Lipid Panel With LDL/HDL Ratio    3. Chronic diastolic heart failure (HCC)  I50.32 PCV ECHOCARDIOGRAM COMPLETE    Basic metabolic panel    Brain natriuretic peptide    Lipid Panel With LDL/HDL Ratio      No orders of the defined types were placed in this encounter.   There are no discontinued medications.   This patients CHA2DS2-VASc Score 3 (Age, HF) and yearly risk of stroke 3.2%.   Recommendations:   DANNELL GORTNEY  is a 80 y.o. male  with moderate obesity, paroxysmal atrial fibrillation and probably sinus node dysfunction with  sinus bradycardia even on minimal dose of beta-blocker, OSA on CPAP, hyperlipidemia, bronchial asthma and history of excessive alcohol intake.  Patient had developed severe LV systolic dysfunction, he was then started on sotalol on 12/18/2018 and echocardiogram in August 2020 following cardioversion revealed normalization of LVEF.  Patient presents for 6 follow-up.  Last office visit patient was stable, therefore no changes were made.  Patient has had no known recurrence of atrial fibrillation since last office visit.  He continues to tolerate anticoagulation without bleeding diathesis.  He has had no known recurrence of atrial fibrillation, continue present medications.  Blood pressure was initially elevated in the office today, however it is well controlled upon recheck.  Patient's symptoms of "chest fullness" are atypical at best, low suspicion for cardiac etiology.  However will obtain repeat echocardiogram as well as BNP.  Patient is also due for lipid profile testing.  Discussed with patient option of repeat ischemic evaluation, however shared decision was to proceed with echocardiogram and continue with watchful waiting.  Counseled patient regarding signs symptoms that would warrant urgent or emergent  evaluation, he verbalized understanding agreement.  Again discussed with patient regarding need to reduce alcohol intake and lose weight, he verbalized understanding.  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  Follow-up in 6 months, sooner if needed, for atrial fibrillation, hyperlipidemia, hypertension.   Alethia Berthold, PA-C 04/21/2021, 2:56 PM Office: [phone]

## 2021-04-26 ENCOUNTER — Ambulatory Visit: Payer: Medicare Other

## 2021-04-26 ENCOUNTER — Other Ambulatory Visit: Payer: Self-pay

## 2021-04-26 DIAGNOSIS — I5032 Chronic diastolic (congestive) heart failure: Secondary | ICD-10-CM

## 2021-04-29 NOTE — Progress Notes (Signed)
Called and spoke to pt, pt voiced understanding. Pt scheduling an appointment now.

## 2021-05-02 ENCOUNTER — Other Ambulatory Visit: Payer: Self-pay | Admitting: Cardiology

## 2021-05-02 DIAGNOSIS — I480 Paroxysmal atrial fibrillation: Secondary | ICD-10-CM

## 2021-05-02 NOTE — Telephone Encounter (Signed)
Can I refill?

## 2021-05-04 LAB — LIPID PANEL WITH LDL/HDL RATIO
Cholesterol, Total: 182 mg/dL (ref 100–199)
HDL: 96 mg/dL (ref >39)
LDL Chol Calc (NIH): 65 mg/dL (ref 0–99)
LDL/HDL Ratio: 0.7 ratio (ref 0.0–3.6)
Triglycerides: 125 mg/dL (ref 0–149)
VLDL Cholesterol Cal: 21 mg/dL (ref 5–40)

## 2021-05-04 LAB — BASIC METABOLIC PANEL
BUN/Creatinine Ratio: 17 (ref 10–24)
BUN: 18 mg/dL (ref 8–27)
CO2: 25 mmol/L (ref 20–29)
Calcium: 9.7 mg/dL (ref 8.6–10.2)
Chloride: 102 mmol/L (ref 96–106)
Creatinine, Ser: 1.08 mg/dL (ref 0.76–1.27)
Glucose: 99 mg/dL (ref 70–99)
Potassium: 5.4 mmol/L — ABNORMAL HIGH (ref 3.5–5.2)
Sodium: 142 mmol/L (ref 134–144)
eGFR: 70 mL/min/{1.73_m2} (ref >59)

## 2021-05-04 LAB — BRAIN NATRIURETIC PEPTIDE: BNP: 83.5 pg/mL (ref 0.0–100.0)

## 2021-05-06 ENCOUNTER — Other Ambulatory Visit: Payer: Self-pay | Admitting: Internal Medicine

## 2021-05-10 ENCOUNTER — Ambulatory Visit: Payer: Medicare Other | Admitting: Student

## 2021-05-10 ENCOUNTER — Encounter: Payer: Self-pay | Admitting: Student

## 2021-05-10 ENCOUNTER — Other Ambulatory Visit: Payer: Self-pay

## 2021-05-10 VITALS — BP 147/81 | HR 62 | Temp 98.8°F | Resp 16 | Ht 71.0 in | Wt 267.8 lb

## 2021-05-10 DIAGNOSIS — I5032 Chronic diastolic (congestive) heart failure: Secondary | ICD-10-CM

## 2021-05-10 NOTE — Progress Notes (Addendum)
Primary Physician/Referring:  [name], MD  Patient ID: Darren [name], male    DOB: 1940/09/25, 80 y.o.   MRN: [id number]  Chief Complaint  Patient presents with   Paroxysmal atrial fibrillation     2 week   Follow-up   HPI:    DEMICHAEL [name]  is a 80 y.o. male  with moderate obesity, paroxysmal atrial fibrillation and probably sinus node dysfunction with  sinus bradycardia even on minimal dose of beta-blocker, OSA on CPAP, hyperlipidemia, bronchial asthma and history of excessive alcohol intake.  Patient had developed severe LV systolic dysfunction, he was then started on sotalol on 12/18/2018 and echocardiogram in August 2020 following cardioversion revealed normalization of LVEF.  Patient presents to discuss results of recent echocardiogram.  Echocardiogram 04/27/2021 revealed grade 2 diastolic dysfunction and moderate left atrial dilation as well as increased pulmonic regurgitation from mild to moderate with evidence of mild pulmonary hypertension.  Reviewed and discussed this at length with patient.  Denies palpitations, syncope, near syncope, dizziness, orthopnea, PND.  He does continue to have bilateral lower leg edema which is chronic and stable.  Unfortunately he does continue to drink several alcoholic beverages each night.   He is scheduled to see pulmonology later this week.  Past Medical History:  Diagnosis Date   Arthritis    "maybe a little"   Asthma    "mild"   Atrial fibrillation ([place])    bilateral leg edema 09/11/2018   Cancer ([place])    prostate cancer   Dyspnea    Dyspnea on exertion 09/11/2018   Dysrhythmia    Afib with rapid ventricular   Essential hypertension 07/25/2018   Fatty liver    GERD (gastroesophageal reflux disease)    GI bleed 2018   Gout    History of blood transfusion 2018   Obstructive sleep apnea 09/11/2018   OSA (obstructive sleep apnea)    Paroxysmal atrial fibrillation ([place]) 09/11/2018   Pre-diabetes    Past Surgical History:   Procedure Laterality Date   CARDIOVERSION N/A 10/08/2018   Procedure: CARDIOVERSION;  Surgeon: [name], MD;  Location: [facility];  Service: Cardiovascular;  Laterality: N/A;   CARDIOVERSION N/A 01/17/2019   Procedure: CARDIOVERSION;  Surgeon: [name], MD;  Location: [facility];  Service: Cardiovascular;  Laterality: N/A;   COLONOSCOPY Left 03/10/2017   Procedure: COLONOSCOPY;  Surgeon: Arta Silence, MD;  Location: [facility];  Service: Endoscopy;  Laterality: Left;   ESOPHAGOGASTRODUODENOSCOPY (EGD) WITH PROPOFOL Left 03/09/2017   Procedure: ESOPHAGOGASTRODUODENOSCOPY (EGD) WITH PROPOFOL;  Surgeon: Ronnette Juniper, MD;  Location: [facility];  Service: Gastroenterology;  Laterality: Left;   EYE SURGERY Bilateral    cataract    FINGER SURGERY Right    5th   Prostate     [facility]   Family History  Problem Relation Age of Onset   Emphysema Father        smoked   Social History   Tobacco Use   Smoking status: Former    Packs/day: 1.00    Years: 25.00    Pack years: 25.00    Types: Cigarettes    Start date: 52    Quit date: 07/10/1988    Years since quitting: 32.8   Smokeless tobacco: Never  Substance Use Topics   Alcohol use: Yes    Alcohol/week: 25.0 standard drinks    Types: 25 Shots of liquor per week    Comment: Daily    Marital Status: Married  ROS  Review of Systems  Cardiovascular:  Positive for dyspnea on exertion (chronic stable ) and leg swelling (chronic and stable). Negative for chest pain, claudication, near-syncope, orthopnea, palpitations, paroxysmal nocturnal dyspnea and syncope.  Respiratory:  Positive for snoring (on CPAP). Negative for shortness of breath.   Hematologic/Lymphatic: Does not bruise/bleed easily.  Musculoskeletal:  Positive for joint pain.  Neurological:  Negative for dizziness and weakness.  Objective  Blood pressure (!) 147/81, pulse 62, temperature 98.8 F (37.1 C), temperature source Temporal, resp. rate 16, height 5' 11"   (1.803 m), weight 267 lb 12.8 oz (121.5 kg), SpO2 95 %.  Vitals with BMI 05/13/2021 05/10/2021 05/10/2021  Height 5' 11"  - 5' 11"   Weight 263 lbs 6 oz - 267 lbs 13 oz  BMI 63.84 - 53.64  Systolic 680 321 224  Diastolic 70 81 78  Pulse 64 62 57     Physical Exam Vitals reviewed.  Constitutional:      General: He is not in acute distress.    Appearance: He is well-developed.     Comments: Moderately obese  Neck:     Vascular: No JVD.  Cardiovascular:     Rate and Rhythm: Normal rate and regular rhythm.     Pulses:          Carotid pulses are 2+ on the right side and 2+ on the left side.      Dorsalis pedis pulses are 2+ on the right side and 2+ on the left side.       Posterior tibial pulses are 2+ on the right side and 2+ on the left side.     Heart sounds: Normal heart sounds. No murmur heard.   No gallop.     Comments: Femoral and popliteal pulse difficult to feel due to patient's body habitus. No JVD Pulmonary:     Effort: Pulmonary effort is normal.     Breath sounds: Normal breath sounds. No wheezing, rhonchi or rales.  Abdominal:     Comments: Obese  Musculoskeletal:        General: Normal range of motion.     Right lower leg: Edema (1-2+ pitting) present.     Left lower leg: Edema (1-2+ pitting ) present.  Skin:    General: Skin is warm and dry.  Neurological:     Mental Status: He is alert.  No change in physical exam compared to last office visit.  Laboratory examination:   Recent Labs    05/03/21 1050  NA 142  K 5.4*  CL 102  CO2 25  GLUCOSE 99  BUN 18  CREATININE 1.08  CALCIUM 9.7   estimated creatinine clearance is 74.8 mL/min (by C-G formula based on SCr of 1.08 mg/dL).  CMP Latest Ref Rng & Units 05/03/2021 01/17/2019 01/13/2019  Glucose 70 - 99 mg/dL 99 97 96  BUN 8 - 27 mg/dL 18 - 32(H)  Creatinine 0.76 - 1.27 mg/dL 1.08 - 1.41(H)  Sodium 134 - 144 mmol/L 142 134(L) 135  Potassium 3.5 - 5.2 mmol/L 5.4(H) 5.1 6.5(H)  Chloride 96 - 106 mmol/L 102 -  99  CO2 20 - 29 mmol/L 25 - 22  Calcium 8.6 - 10.2 mg/dL 9.7 - 9.6  Total Protein 6.5 - 8.1 g/dL - - -  Total Bilirubin 0.3 - 1.2 mg/dL - - -  Alkaline Phos 38 - 126 U/L - - -  AST 15 - 41 U/L - - -  ALT 17 - 63 U/L - - -   CBC Latest Ref Rng &  Units 01/17/2019 10/08/2018 03/10/2017  WBC 4.0 - 10.5 K/uL - - -  Hemoglobin 13.0 - 17.0 g/dL 14.3 16.3 7.5(L)  Hematocrit 39.0 - 52.0 % 42.0 48.0 25.8(L)  Platelets 150 - 400 K/uL - - -   Lipid Panel     Component Value Date/Time   CHOL 182 05/03/2021 1050   TRIG 125 05/03/2021 1050   HDL 96 05/03/2021 1050   LDLCALC 65 05/03/2021 1050   HEMOGLOBIN A1C No results found for: HGBA1C, MPG TSH No results for input(s): TSH in the last 8760 hours.  External labs:  03/11/2020: Total cholesterol 165, triglycerides 104, HDL 81, LDL 63. Hb 14.4/HCT 45.0, platelets 178, normal indicis. Sodium 141, potassium 104, BUN 16, creatinine 0.90, EGFR >60 mL.  A1c 6.1%. 09/10/2018: HB 15.8/HCT 46.3, mild macrocytosis present with MCV of 100.  Platelet count 154.  Potassium 4.9, BUN 24, creatinine 1.1, eGFR greater than 60 mL.  ALT and AST elevated at 64 and 57 respectively.  Total cholesterol 184, triglycerides 199, HDL 85, LDL 59.  07/12/2017: HB 15.6, HCT 47.8, platelets 155.  Normal indicis.  Potassium 4.8, serum glucose 160 mg, BUN 23, creatinine 1.1.  CMP normal.  Total cholesterol 232, triglycerides 138, HDL 62, LDL 07/10/40.  Non-HDL cholesterol 170.  Uric acid is elevated at 7.8, vitamin D 25 x 1.  Allergies   Allergies  Allergen Reactions   Amlodipine     Leg edema   Penicillins Hives    Has patient had a PCN reaction causing immediate rash, facial/tongue/throat swelling, SOB or lightheadedness with hypotension: No Has patient had a PCN reaction causing severe rash involving mucus membranes or skin necrosis: No Has patient had a PCN reaction that required hospitalization: No Has patient had a PCN reaction occurring within the last 10 years:  No--childhood reaction If all of the above answers are "NO", then may proceed with Cephalosporin use.    Medications Prior to Visit:   Outpatient Medications Prior to Visit  Medication Sig Dispense Refill   allopurinol (ZYLOPRIM) 100 MG tablet Take 100 mg by mouth daily.      ammonium lactate (AMLACTIN) 12 % cream Apply 1 g topically as needed for dry skin (scaly feet).     atorvastatin (LIPITOR) 40 MG tablet Take 40 mg by mouth daily.      Cholecalciferol (VITAMIN D-3 PO) Take 2,000 Units by mouth daily.      colchicine 0.6 MG tablet Take 0.6 mg by mouth daily as needed (gout).      colesevelam (WELCHOL) 625 MG tablet Take 1,875 mg by mouth 2 (two) times daily with a meal.      DULERA 100-5 MCG/ACT AERO USE 2 INHALATIONS TWICE A DAY 39 g 3   ELIQUIS 5 MG TABS tablet TAKE 1 TABLET TWICE A DAY 60 tablet 11   hydrALAZINE (APRESOLINE) 25 MG tablet TAKE 1 TABLET BY MOUTH 3 TIMES DAILY. 90 tablet 1   isosorbide dinitrate (ISORDIL) 30 MG tablet Take 1 tablet (30 mg total) by mouth 3 (three) times daily. 90 tablet 2   L-Methylfolate-Algae-B12-B6 (METANX) 3-90.314-2-35 MG CAPS Take 1 tablet by mouth 2 (two) times daily.      Magnesium 400 MG TABS Take 400 mg by mouth daily at 12 noon.      metoprolol tartrate (LOPRESSOR) 25 MG tablet TAKE 1 TABLET BY MOUTH 2 TIMES DAILY. 60 tablet 3   montelukast (SINGULAIR) 10 MG tablet TAKE 1 TABLET BY MOUTH AT BEDTIME 30 tablet 11   omeprazole (  PRILOSEC) 20 MG capsule Take 20 mg by mouth 2 (two) times daily before a meal.     Polyethyl Glycol-Propyl Glycol (SYSTANE OP) Place 1 drop into both eyes 3 (three) times daily as needed (dry eyes).     sotalol (BETAPACE) 80 MG tablet TAKE 1/2 TABLET (40 MG TOTAL) BY MOUTH 2 TIMES DAILY. 45 tablet 3   telmisartan (MICARDIS) 80 MG tablet Take 80 mg by mouth daily.     thiamine (VITAMIN B-1) 100 MG tablet Take 100 mg by mouth daily.     gabapentin (NEURONTIN) 100 MG capsule TAKE 1 CAPSULE BY MOUTH 3 TO 4 TIMES A DAY 120  capsule 0   No facility-administered medications prior to visit.   Final Medications at End of Visit    Current Meds  Medication Sig   allopurinol (ZYLOPRIM) 100 MG tablet Take 100 mg by mouth daily.    ammonium lactate (AMLACTIN) 12 % cream Apply 1 g topically as needed for dry skin (scaly feet).   atorvastatin (LIPITOR) 40 MG tablet Take 40 mg by mouth daily.    Cholecalciferol (VITAMIN D-3 PO) Take 2,000 Units by mouth daily.    colchicine 0.6 MG tablet Take 0.6 mg by mouth daily as needed (gout).    colesevelam (WELCHOL) 625 MG tablet Take 1,875 mg by mouth 2 (two) times daily with a meal.    DULERA 100-5 MCG/ACT AERO USE 2 INHALATIONS TWICE A DAY   ELIQUIS 5 MG TABS tablet TAKE 1 TABLET TWICE A DAY   hydrALAZINE (APRESOLINE) 25 MG tablet TAKE 1 TABLET BY MOUTH 3 TIMES DAILY.   isosorbide dinitrate (ISORDIL) 30 MG tablet Take 1 tablet (30 mg total) by mouth 3 (three) times daily.   L-Methylfolate-Algae-B12-B6 (METANX) 3-90.314-2-35 MG CAPS Take 1 tablet by mouth 2 (two) times daily.    Magnesium 400 MG TABS Take 400 mg by mouth daily at 12 noon.    metoprolol tartrate (LOPRESSOR) 25 MG tablet TAKE 1 TABLET BY MOUTH 2 TIMES DAILY.   montelukast (SINGULAIR) 10 MG tablet TAKE 1 TABLET BY MOUTH AT BEDTIME   omeprazole (PRILOSEC) 20 MG capsule Take 20 mg by mouth 2 (two) times daily before a meal.   Polyethyl Glycol-Propyl Glycol (SYSTANE OP) Place 1 drop into both eyes 3 (three) times daily as needed (dry eyes).   sotalol (BETAPACE) 80 MG tablet TAKE 1/2 TABLET (40 MG TOTAL) BY MOUTH 2 TIMES DAILY.   telmisartan (MICARDIS) 80 MG tablet Take 80 mg by mouth daily.   thiamine (VITAMIN B-1) 100 MG tablet Take 100 mg by mouth daily.   [DISCONTINUED] gabapentin (NEURONTIN) 100 MG capsule TAKE 1 CAPSULE BY MOUTH 3 TO 4 TIMES A DAY    Radiology:  No results found.  Cardiac Studies:   Lexiscan myoview stress test 09/20/2018:  1. Lexiscan stress test was performed. Exercise capacity was not  assessed. Stress symptoms included dizziness. Resting blood pressure was 164/108 mmHg and peak effect blood pressure was 174/116 mmHg. The resting and stress electrocardiogram demonstrated atrial fibrillation with rapid ventricular rate, occasional PVC, and normal rest repolarization.  Stress EKG is non diagnostic for ischemia as it is a pharmacologic stress. In addition, no ischemic changes seen at heart rate of 136 bpm.  2. The overall quality of the study is good.  Left ventricular cavity is noted to be enlarged on the rest and stress studies.  Gated SPECT images reveal moderate global decrease in myocardial thickening and wall motion.  The left ventricular ejection fraction  was calculated or visually estimated to be 34%.  REST and STRESS images demonstrate mildly decreased tracer uptake in the basal inferior, mid inferior and apical inferior segments of the left ventricle, with no reversibility. Findings suggestive of dilated nonishcemic cardiomyopathy.  3. High risk study due to reduced LVED. Recommend clinical correlation.   PCV ECHOCARDIOGRAM COMPLETE 68/61/6837 Normal LV systolic function with visual EF 50-55%. Left ventricle cavity is normal in size. Mild left ventricular hypertrophy. Normal global wall motion. Doppler evidence of grade II  diastolic dysfunction, normal LAP. Left atrial cavity is moderately dilated. Mild (Grade I) mitral regurgitation. Mild tricuspid regurgitation. Mild pulmonary hypertension. RVSP measures 49 mmHg. Moderate pulmonic regurgitation. IVC is dilated with a respiratory response of >50%. Compared to study 29/08/1113 normal diastolic filling pattern is now G2DD, mild LAE is now moderate, mild PR is now moderate, PHTN is new finding, otherwise no significant change.  EKG:  04/21/2021: Sinus rhythm at a rate of 75 bpm with frequent PVCs and quadrimeny pattern.  Left axis, left anterior fascicular block.  Incomplete right bundle branch block.  Compared to EKG  10/20/2020, no significant change.  EKG 10/30/2019: Normal sinus rhythm at the rate of 60 bpm, left axis deviation, left anterior fascicular block.  Incomplete right bundle branch block.  Normal QT interval.  No evidence of ischemia.  No significant change from EKG 05/01/2019: No present PVC (2).  Assessment     ICD-10-CM   1. Chronic diastolic heart failure (HCC)  I50.32        No orders of the defined types were placed in this encounter.   There are no discontinued medications.   This patients CHA2DS2-VASc Score 3 (Age, HF) and yearly risk of stroke 3.2%.   Recommendations:   DIYAN DAVE  is a 80 y.o. male  with moderate obesity, paroxysmal atrial fibrillation and probably sinus node dysfunction with  sinus bradycardia even on minimal dose of beta-blocker, OSA on CPAP, hyperlipidemia, bronchial asthma and history of excessive alcohol intake.  Patient had developed severe LV systolic dysfunction, he was then started on sotalol on 12/18/2018 and echocardiogram in August 2020 following cardioversion revealed normalization of LVEF.  Patient presents for follow-up to discuss results of recent echocardiogram.  Reviewed echocardiogram with patient and his wife, details above.  Patient's questions and concerns were addressed.  There is no medical evidence of acute decompensated heart failure.  Patient and recent BNP was normal.  Renal function is stable and lipids are well controlled.  Patient's symptoms of "chest fullness" since last office visit have resolved.  His physical exam is stable.   Counseled patient regarding diet and lifestyle modifications including reducing alcohol intake and weight loss.  No changes were made to medications at this time.  Follow-up in 3 months, sooner if needed, for diastolic dysfunction, hypertension, paroxysmal atrial fibrillation.   Alethia Berthold, PA-C 05/13/2021, 3:17 PM Office: [phone]

## 2021-05-13 ENCOUNTER — Ambulatory Visit (INDEPENDENT_AMBULATORY_CARE_PROVIDER_SITE_OTHER): Payer: Medicare Other | Admitting: Primary Care

## 2021-05-13 ENCOUNTER — Ambulatory Visit: Payer: Medicare Other | Admitting: Internal Medicine

## 2021-05-13 ENCOUNTER — Other Ambulatory Visit: Payer: Self-pay

## 2021-05-13 ENCOUNTER — Encounter: Payer: Self-pay | Admitting: Primary Care

## 2021-05-13 DIAGNOSIS — G4733 Obstructive sleep apnea (adult) (pediatric): Secondary | ICD-10-CM

## 2021-05-13 DIAGNOSIS — J45991 Cough variant asthma: Secondary | ICD-10-CM | POA: Diagnosis not present

## 2021-05-13 MED ORDER — GABAPENTIN 100 MG PO CAPS: ORAL_CAPSULE | ORAL | 0 refills | Status: DC

## 2021-05-13 NOTE — Assessment & Plan Note (Addendum)
-   Patient is 100% complaint with CPAP use >4 hours and reports benefit in sleep  - Pressure 5-18cm h20; residual AHI 0.9 - No changes today - FU annually with Dr. Ander Slade

## 2021-05-13 NOTE — Patient Instructions (Signed)
Recommendations: - Continue Gabapentin 100mg  3-4 times a day for cough - Continue Dulera two puffs morning and evening - Continue Singulair 10mg  at bedtime  - Continue CPAP every night for 4-6 hours or longer   Follow-up: - 6 months with Dr. Melvyn Novas or sooner if needed

## 2021-05-13 NOTE — Assessment & Plan Note (Signed)
-   Throat clearing has substantially improved on Gabapentin 100mg  TID-QID. He still has occasional productive cough with clear mucus. Continue Dulera 16mcg and Singulair 10mg  at bedtime.

## 2021-05-13 NOTE — Progress Notes (Signed)
@Patient  ID: Darren [name], male    DOB: 23-Feb-1941, 80 y.o.   MRN: [id number]  Chief Complaint  Patient presents with   Follow-up    Pt states no concerns     Referring provider: [name], MD  HPI: 80 year old male, former smoker quit in January 1990. PMH significant for COPD GOLD III, cough variant asthma, OSA, chronic rhinitis, HTN, afib, GIB, morbid obesity. Patient of Dr. Melvyn Novas, last seen on 02/10/21. He also follows with Dr. Ander Slade for sleep apnea.   05/13/2021- Interim hx  Patient presents today for 3 month follow-up cough variant asthma. During last visit he was started on Gabapentin 100mg  four times daily. He feels this medication has helped his cough a good amount. No side effects. It also helps his neuropathy in his feet. Every so often he gets up some mucus. He is trying very hard not to clear his throat. He has some associated reflux. He is working on his diet and weight loss. He is experience normal dyspnea symptoms, gets out of breath walking 50-179ft. He is not panting or gasping with these activities. He would like to start exercising.   Dyspnea: DOE with 50-100 ft but not panting or gasping.  Cough: throat clearing is better, still has some clear mucus production Sleeping: Wearing CPAP 100%   SABA: None  O2: None   Airview download 04/12/21-05/11/21: Usage 30/30 days; 100% > 4 hours Average usage 8 hours 48 mins Pressure 5-18cm h20 (14.4cm h20) Airleaks 34.2L/min (95%) AHI 0.9    Allergies  Allergen Reactions   Amlodipine     Leg edema   Penicillins Hives    Has patient had a PCN reaction causing immediate rash, facial/tongue/throat swelling, SOB or lightheadedness with hypotension: No Has patient had a PCN reaction causing severe rash involving mucus membranes or skin necrosis: No Has patient had a PCN reaction that required hospitalization: No Has patient had a PCN reaction occurring within the last 10 years: No--childhood reaction If all of the above  answers are "NO", then may proceed with Cephalosporin use.    Immunization History  Administered Date(s) Administered   Influenza Whole 04/09/2017   Influenza-Unspecified 03/24/2016   PFIZER(Purple Top)SARS-COV-2 Vaccination 09/08/2019, 10/07/2019   Pneumococcal-Unspecified 01/09/2014    Past Medical History:  Diagnosis Date   Arthritis    "maybe a little"   Asthma    "mild"   Atrial fibrillation ([place])    bilateral leg edema 09/11/2018   Cancer (HCC)    prostate cancer   Dyspnea    Dyspnea on exertion 09/11/2018   Dysrhythmia    Afib with rapid ventricular   Essential hypertension 07/25/2018   Fatty liver    GERD (gastroesophageal reflux disease)    GI bleed 2018   Gout    History of blood transfusion 2018   Obstructive sleep apnea 09/11/2018   OSA (obstructive sleep apnea)    Paroxysmal atrial fibrillation ([place]) 09/11/2018   Pre-diabetes     Tobacco History: Social History   Tobacco Use  Smoking Status Former   Packs/day: 1.00   Years: 25.00   Pack years: 25.00   Types: Cigarettes   Start date: 92   Quit date: 07/10/1988   Years since quitting: 32.8  Smokeless Tobacco Never   Counseling given: Not Answered   Outpatient Medications Prior to Visit  Medication Sig Dispense Refill   allopurinol (ZYLOPRIM) 100 MG tablet Take 100 mg by mouth daily.      ammonium lactate (AMLACTIN)  12 % cream Apply 1 g topically as needed for dry skin (scaly feet).     atorvastatin (LIPITOR) 40 MG tablet Take 40 mg by mouth daily.      Cholecalciferol (VITAMIN D-3 PO) Take 2,000 Units by mouth daily.      colchicine 0.6 MG tablet Take 0.6 mg by mouth daily as needed (gout).      colesevelam (WELCHOL) 625 MG tablet Take 1,875 mg by mouth 2 (two) times daily with a meal.      DULERA 100-5 MCG/ACT AERO USE 2 INHALATIONS TWICE A DAY 39 g 3   ELIQUIS 5 MG TABS tablet TAKE 1 TABLET TWICE A DAY 60 tablet 11   hydrALAZINE (APRESOLINE) 25 MG tablet TAKE 1 TABLET BY MOUTH 3 TIMES DAILY. 90  tablet 1   isosorbide dinitrate (ISORDIL) 30 MG tablet Take 1 tablet (30 mg total) by mouth 3 (three) times daily. 90 tablet 2   L-Methylfolate-Algae-B12-B6 (METANX) 3-90.314-2-35 MG CAPS Take 1 tablet by mouth 2 (two) times daily.      Magnesium 400 MG TABS Take 400 mg by mouth daily at 12 noon.      metoprolol tartrate (LOPRESSOR) 25 MG tablet TAKE 1 TABLET BY MOUTH 2 TIMES DAILY. 60 tablet 3   montelukast (SINGULAIR) 10 MG tablet TAKE 1 TABLET BY MOUTH AT BEDTIME 30 tablet 11   omeprazole (PRILOSEC) 20 MG capsule Take 20 mg by mouth 2 (two) times daily before a meal.     Polyethyl Glycol-Propyl Glycol (SYSTANE OP) Place 1 drop into both eyes 3 (three) times daily as needed (dry eyes).     sotalol (BETAPACE) 80 MG tablet TAKE 1/2 TABLET (40 MG TOTAL) BY MOUTH 2 TIMES DAILY. 45 tablet 3   telmisartan (MICARDIS) 80 MG tablet Take 80 mg by mouth daily.     thiamine (VITAMIN B-1) 100 MG tablet Take 100 mg by mouth daily.     gabapentin (NEURONTIN) 100 MG capsule TAKE 1 CAPSULE BY MOUTH 3 TO 4 TIMES A DAY 120 capsule 0   No facility-administered medications prior to visit.    Review of Systems  Review of Systems  Constitutional: Negative.   HENT: Negative.    Respiratory:  Positive for cough. Negative for chest tightness, shortness of breath and wheezing.     Physical Exam  BP (!) 148/70 (BP Location: Left Arm, Patient Position: Sitting, Cuff Size: Normal)   Pulse 64   Temp 98.4 F (36.9 C) (Oral)   Ht 5\' 11"  (1.803 m)   Wt 263 lb 6.4 oz (119.5 kg)   SpO2 93%   BMI 36.74 kg/m  Physical Exam Constitutional:      Appearance: Normal appearance.  HENT:     Head: Normocephalic and atraumatic.  Cardiovascular:     Rate and Rhythm: Normal rate and regular rhythm.  Pulmonary:     Effort: Pulmonary effort is normal.     Breath sounds: Normal breath sounds.     Comments: CTA Musculoskeletal:        General: Normal range of motion.  Skin:    General: Skin is warm and dry.   Neurological:     General: No focal deficit present.     Mental Status: He is alert and oriented to person, place, and time. Mental status is at baseline.     Lab Results:  CBC    Component Value Date/Time   WBC 7.0 03/08/2017 0349   RBC 2.83 (L) 03/08/2017 1830   RBC 2.23 (L)  03/08/2017 0349   HGB 14.3 01/17/2019 0711   HCT 42.0 01/17/2019 0711   PLT 285 03/08/2017 0349   MCV 83.9 03/08/2017 0349   MCH 24.2 (L) 03/08/2017 0349   MCHC 28.9 (L) 03/08/2017 0349   RDW 19.6 (H) 03/08/2017 0349   LYMPHSABS 1.6 05/10/2016 1518   MONOABS 0.6 05/10/2016 1518   EOSABS 0.4 05/10/2016 1518   BASOSABS 0.1 05/10/2016 1518    BMET    Component Value Date/Time   NA 142 05/03/2021 1050   K 5.4 (H) 05/03/2021 1050   CL 102 05/03/2021 1050   CO2 25 05/03/2021 1050   GLUCOSE 99 05/03/2021 1050   GLUCOSE 97 01/17/2019 0711   BUN 18 05/03/2021 1050   CREATININE 1.08 05/03/2021 1050   CALCIUM 9.7 05/03/2021 1050   GFRNONAA 48 (L) 01/13/2019 1143   GFRAA 56 (L) 01/13/2019 1143    BNP    Component Value Date/Time   BNP 83.5 05/03/2021 1050    ProBNP No results found for: PROBNP  Imaging: PCV ECHOCARDIOGRAM COMPLETE  Result Date: 04/27/2021 Echocardiogram 04/26/2021: Normal LV systolic function with visual EF 50-55%. Left ventricle cavity is normal in size. Mild left ventricular hypertrophy. Normal global wall motion. Doppler evidence of grade II  diastolic dysfunction, normal LAP. Left atrial cavity is moderately dilated. Mild (Grade I) mitral regurgitation. Mild tricuspid regurgitation. Mild pulmonary hypertension. RVSP measures 49 mmHg. Moderate pulmonic regurgitation. IVC is dilated with a respiratory response of >50%. Compared to study 16/60/6301 normal diastolic filling pattern is now G2DD, mild LAE is now moderate, mild PR is now moderate, PHTN is new finding, otherwise no significant change.    Assessment & Plan:   Cough variant asthma with ? component of UACS - Throat  clearing has substantially improved on Gabapentin 100mg  TID-QID. He still has occasional productive cough with clear mucus. Continue Dulera 176mcg and Singulair 10mg  at bedtime.   Obstructive sleep apnea - Patient is 100% complaint with CPAP use >4 hours and reports benefit in sleep  - Pressure 5-18cm h20; residual AHI 0.9 - No changes today - FU annually with Dr. Marylou Mccoy, NP 05/13/2021

## 2021-05-16 NOTE — Progress Notes (Signed)
Taper Gabapentin 100mg  qweek to find lowest most effective dose.

## 2021-05-20 ENCOUNTER — Other Ambulatory Visit: Payer: Self-pay | Admitting: Student

## 2021-05-20 DIAGNOSIS — I5042 Chronic combined systolic (congestive) and diastolic (congestive) heart failure: Secondary | ICD-10-CM

## 2021-07-15 ENCOUNTER — Other Ambulatory Visit: Payer: Self-pay | Admitting: Student

## 2021-07-15 ENCOUNTER — Other Ambulatory Visit: Payer: Self-pay | Admitting: Primary Care

## 2021-07-15 DIAGNOSIS — I5042 Chronic combined systolic (congestive) and diastolic (congestive) heart failure: Secondary | ICD-10-CM

## 2021-08-10 ENCOUNTER — Encounter: Payer: Self-pay | Admitting: Student

## 2021-08-10 ENCOUNTER — Other Ambulatory Visit: Payer: Self-pay

## 2021-08-10 ENCOUNTER — Ambulatory Visit: Payer: Medicare Other | Admitting: Student

## 2021-08-10 VITALS — BP 160/88 | HR 58 | Temp 98.0°F | Ht 71.0 in | Wt 263.0 lb

## 2021-08-10 DIAGNOSIS — I10 Essential (primary) hypertension: Secondary | ICD-10-CM

## 2021-08-10 DIAGNOSIS — I480 Paroxysmal atrial fibrillation: Secondary | ICD-10-CM

## 2021-08-10 DIAGNOSIS — I5032 Chronic diastolic (congestive) heart failure: Secondary | ICD-10-CM

## 2021-08-10 MED ORDER — ENTRESTO 49-51 MG PO TABS: 1.0000 | ORAL_TABLET | Freq: Two times a day (BID) | ORAL | 3 refills | Status: DC

## 2021-08-10 NOTE — Progress Notes (Signed)
Primary Physician/Referring:  [name], MD  Patient ID: Darren [name], male    DOB: 1940/07/16, 81 y.o.   MRN: [id number]  Chief Complaint  Patient presents with   Chronic diastolic heart failure   Hypertension   Atrial Fibrillation   Follow-up   HPI:    Darren [name]  is a 81 y.o. male  with moderate obesity, paroxysmal atrial fibrillation and probably sinus node dysfunction with  sinus bradycardia even on minimal dose of beta-blocker, OSA on CPAP, hyperlipidemia, bronchial asthma and history of excessive alcohol intake.  Patient had developed severe LV systolic dysfunction, he was then started on sotalol on 12/18/2018 and echocardiogram in August 2020 following cardioversion revealed normalization of LVEF.  Patient presents for 24-monthfollow-up.  At last office visit patient was stable from a cardiovascular standpoint and no changes were made. Patient continues to exercise 2-4 days per week, however this is limited due to orthopedic pain. Patient is without specific complaints today. He does continue to have intermittent leg edema bilaterally which is stable. He has had no known recurrence of atrial fibrillation.   Unfortunately he does continue to drink several alcoholic beverages each night.   Past Medical History:  Diagnosis Date   Arthritis    "maybe a little"   Asthma    "mild"   Atrial fibrillation ([facility]    bilateral leg edema 09/11/2018   [place]    prostate cancer   Dyspnea    Dyspnea on exertion 09/11/2018   Dysrhythmia    Afib with rapid ventricular   Essential hypertension 07/25/2018   Fatty liver    GERD (gastroesophageal reflux disease)    GI bleed 2018   Gout    History of blood transfusion 2018   Obstructive sleep apnea 09/11/2018   OSA (obstructive sleep apnea)    Paroxysmal atrial fibrillation ([facility] 09/11/2018   Pre-diabetes    Past Surgical History:  Procedure Laterality Date   CARDIOVERSION N/A 10/08/2018   Procedure: CARDIOVERSION;  Surgeon:  GAdrian Prows MD;  [place] [facility]  Service: Cardiovascular;  Laterality: N/A;   CARDIOVERSION N/A 01/17/2019   Procedure: CARDIOVERSION;  Surgeon: GAdrian Prows MD;  [place] [facility]  Service: Cardiovascular;  Laterality: N/A;   COLONOSCOPY Left 03/10/2017   Procedure: COLONOSCOPY;  Surgeon: OArta Silence MD;  [place] [facility]  Service: Endoscopy;  Laterality: Left;   ESOPHAGOGASTRODUODENOSCOPY (EGD) WITH PROPOFOL Left 03/09/2017   Procedure: ESOPHAGOGASTRODUODENOSCOPY (EGD) WITH PROPOFOL;  Surgeon: KRonnette Juniper MD;  [place] [facility]  Service: Gastroenterology;  Laterality: Left;   EYE SURGERY Bilateral    cataract    FINGER SURGERY Right    5th   Prostate     [facility]   Family History  Problem Relation Age of Onset   Emphysema Father        smoked   Social History   Tobacco Use   Smoking status: Former    Packs/day: 1.00    Years: 25.00    Pack years: 25.00    Types: Cigarettes    Start date: 154   Quit date: 07/10/1988    Years since quitting: 33.1   Smokeless tobacco: Never  Substance Use Topics   Alcohol use: Yes    Alcohol/week: 25.0 standard drinks    Types: 25 Shots of liquor per week    Comment: Daily    Marital Status: Married  ROS  Review of Systems  Cardiovascular:  Positive for dyspnea on exertion (chronic, stable) and leg swelling (  chronic, stable). Negative for chest pain, claudication, near-syncope, orthopnea, palpitations, paroxysmal nocturnal dyspnea and syncope.  Neurological:  Negative for dizziness.   Objective  Blood pressure (!) 160/88, pulse (!) 58, temperature 98 F (36.7 C), temperature source Temporal, height 5' 11"  (1.803 m), weight 263 lb (119.3 kg), SpO2 96 %.  Vitals with BMI 08/10/2021 08/10/2021 08/10/2021  Height - - 5' 11"   Weight - - 263 lbs  BMI - - 56.8  Systolic 616 837 290  Diastolic 88 94 211  Pulse 58 59 48     Physical Exam Vitals reviewed.  Constitutional:      Appearance: He is well-developed. He  is obese.     Comments: Moderately obese  Neck:     Vascular: No JVD.  Cardiovascular:     Rate and Rhythm: Normal rate and regular rhythm.     Pulses:          Carotid pulses are 2+ on the right side and 2+ on the left side.      Dorsalis pedis pulses are 2+ on the right side and 2+ on the left side.       Posterior tibial pulses are 2+ on the right side and 2+ on the left side.     Heart sounds: Normal heart sounds. No murmur heard.   No gallop.     Comments: Femoral and popliteal pulse difficult to feel due to patient's body habitus. No JVD Pulmonary:     Effort: Pulmonary effort is normal.     Breath sounds: Normal breath sounds.  Musculoskeletal:     Right lower leg: Edema (1-2+ pitting) present.     Left lower leg: Edema (1-2+ pitting ) present.   Laboratory examination:   Recent Labs    05/03/21 1050  NA 142  K 5.4*  CL 102  CO2 25  GLUCOSE 99  BUN 18  CREATININE 1.08  CALCIUM 9.7   CrCl cannot be calculated (Patient's most recent lab result is older than the maximum 21 days allowed.).  CMP Latest Ref Rng & Units 05/03/2021 01/17/2019 01/13/2019  Glucose 70 - 99 mg/dL 99 97 96  BUN 8 - 27 mg/dL 18 - 32(H)  Creatinine 0.76 - 1.27 mg/dL 1.08 - 1.41(H)  Sodium 134 - 144 mmol/L 142 134(L) 135  Potassium 3.5 - 5.2 mmol/L 5.4(H) 5.1 6.5(H)  Chloride 96 - 106 mmol/L 102 - 99  CO2 20 - 29 mmol/L 25 - 22  Calcium 8.6 - 10.2 mg/dL 9.7 - 9.6  Total Protein 6.5 - 8.1 g/dL - - -  Total Bilirubin 0.3 - 1.2 mg/dL - - -  Alkaline Phos 38 - 126 U/L - - -  AST 15 - 41 U/L - - -  ALT 17 - 63 U/L - - -   CBC Latest Ref Rng & Units 01/17/2019 10/08/2018 03/10/2017  WBC 4.0 - 10.5 K/uL - - -  Hemoglobin 13.0 - 17.0 g/dL 14.3 16.3 7.5(L)  Hematocrit 39.0 - 52.0 % 42.0 48.0 25.8(L)  Platelets 150 - 400 K/uL - - -   Lipid Panel     Component Value Date/Time   CHOL 182 05/03/2021 1050   TRIG 125 05/03/2021 1050   HDL 96 05/03/2021 1050   LDLCALC 65 05/03/2021 1050   HEMOGLOBIN  A1C No results found for: HGBA1C, MPG TSH No results for input(s): TSH in the last 8760 hours.  External labs:  03/11/2020: Total cholesterol 165, triglycerides 104, HDL 81, LDL 63. Hb 14.4/HCT 45.0,  platelets 178, normal indicis. Sodium 141, potassium 104, BUN 16, creatinine 0.90, EGFR >60 mL.  A1c 6.1%. 09/10/2018: HB 15.8/HCT 46.3, mild macrocytosis present with MCV of 100.  Platelet count 154.  Potassium 4.9, BUN 24, creatinine 1.1, eGFR greater than 60 mL.  ALT and AST elevated at 64 and 57 respectively.  Total cholesterol 184, triglycerides 199, HDL 85, LDL 59.  07/12/2017: HB 15.6, HCT 47.8, platelets 155.  Normal indicis.  Potassium 4.8, serum glucose 160 mg, BUN 23, creatinine 1.1.  CMP normal.  Total cholesterol 232, triglycerides 138, HDL 62, LDL 07/10/40.  Non-HDL cholesterol 170.  Uric acid is elevated at 7.8, vitamin D 25 x 1.  Allergies   Allergies  Allergen Reactions   Amlodipine     Leg edema   Penicillins Hives    Has patient had a PCN reaction causing immediate rash, facial/tongue/throat swelling, SOB or lightheadedness with hypotension: No Has patient had a PCN reaction causing severe rash involving mucus membranes or skin necrosis: No Has patient had a PCN reaction that required hospitalization: No Has patient had a PCN reaction occurring within the last 10 years: No--childhood reaction If all of the above answers are "NO", then may proceed with Cephalosporin use.    Medications Prior to Visit:   Outpatient Medications Prior to Visit  Medication Sig Dispense Refill   allopurinol (ZYLOPRIM) 100 MG tablet Take 100 mg by mouth daily.      Cholecalciferol (VITAMIN D-3 PO) Take 2,000 Units by mouth daily.      colchicine 0.6 MG tablet Take 0.6 mg by mouth daily as needed (gout).      colesevelam (WELCHOL) 625 MG tablet Take 1,875 mg by mouth 2 (two) times daily with a meal.      ELIQUIS 5 MG TABS tablet TAKE 1 TABLET TWICE A DAY 60 tablet 11   gabapentin  (NEURONTIN) 100 MG capsule TAKE 1 CAPSULE BY MOUTH 3 TO 4 TIMES A DAY 120 capsule 2   hydrALAZINE (APRESOLINE) 25 MG tablet TAKE 1 TABLET BY MOUTH 3 TIMES DAILY. 90 tablet 1   isosorbide mononitrate (IMDUR) 120 MG 24 hr tablet Take 120 mg by mouth daily.     L-Methylfolate-Algae-B12-B6 (METANX) 3-90.314-2-35 MG CAPS Take 1 tablet by mouth 2 (two) times daily.      Magnesium 400 MG TABS Take 400 mg by mouth daily at 12 noon.      metoprolol tartrate (LOPRESSOR) 25 MG tablet TAKE 1 TABLET BY MOUTH 2 TIMES DAILY. 60 tablet 3   montelukast (SINGULAIR) 10 MG tablet TAKE 1 TABLET BY MOUTH AT BEDTIME 30 tablet 11   omeprazole (PRILOSEC) 20 MG capsule Take 20 mg by mouth 2 (two) times daily before a meal.     Polyethyl Glycol-Propyl Glycol (SYSTANE OP) Place 1 drop into both eyes 3 (three) times daily as needed (dry eyes).     sotalol (BETAPACE) 80 MG tablet TAKE 1/2 TABLET (40 MG TOTAL) BY MOUTH 2 TIMES DAILY. 45 tablet 3   thiamine (VITAMIN B-1) 100 MG tablet Take 100 mg by mouth daily.     telmisartan (MICARDIS) 80 MG tablet Take 80 mg by mouth daily.     ammonium lactate (AMLACTIN) 12 % cream Apply 1 g topically as needed for dry skin (scaly feet).     atorvastatin (LIPITOR) 40 MG tablet Take 40 mg by mouth daily.      DULERA 100-5 MCG/ACT AERO USE 2 INHALATIONS TWICE A DAY 39 g 3   isosorbide dinitrate (  ISORDIL) 30 MG tablet Take 1 tablet (30 mg total) by mouth 3 (three) times daily. 90 tablet 2   No facility-administered medications prior to visit.   Final Medications at End of Visit    Current Meds  Medication Sig   allopurinol (ZYLOPRIM) 100 MG tablet Take 100 mg by mouth daily.    Cholecalciferol (VITAMIN D-3 PO) Take 2,000 Units by mouth daily.    colchicine 0.6 MG tablet Take 0.6 mg by mouth daily as needed (gout).    colesevelam (WELCHOL) 625 MG tablet Take 1,875 mg by mouth 2 (two) times daily with a meal.    ELIQUIS 5 MG TABS tablet TAKE 1 TABLET TWICE A DAY   gabapentin (NEURONTIN)  100 MG capsule TAKE 1 CAPSULE BY MOUTH 3 TO 4 TIMES A DAY   hydrALAZINE (APRESOLINE) 25 MG tablet TAKE 1 TABLET BY MOUTH 3 TIMES DAILY.   isosorbide mononitrate (IMDUR) 120 MG 24 hr tablet Take 120 mg by mouth daily.   L-Methylfolate-Algae-B12-B6 (METANX) 3-90.314-2-35 MG CAPS Take 1 tablet by mouth 2 (two) times daily.    Magnesium 400 MG TABS Take 400 mg by mouth daily at 12 noon.    metoprolol tartrate (LOPRESSOR) 25 MG tablet TAKE 1 TABLET BY MOUTH 2 TIMES DAILY.   montelukast (SINGULAIR) 10 MG tablet TAKE 1 TABLET BY MOUTH AT BEDTIME   omeprazole (PRILOSEC) 20 MG capsule Take 20 mg by mouth 2 (two) times daily before a meal.   Polyethyl Glycol-Propyl Glycol (SYSTANE OP) Place 1 drop into both eyes 3 (three) times daily as needed (dry eyes).   sacubitril-valsartan (ENTRESTO) 49-51 MG Take 1 tablet by mouth 2 (two) times daily.   sotalol (BETAPACE) 80 MG tablet TAKE 1/2 TABLET (40 MG TOTAL) BY MOUTH 2 TIMES DAILY.   thiamine (VITAMIN B-1) 100 MG tablet Take 100 mg by mouth daily.   [DISCONTINUED] telmisartan (MICARDIS) 80 MG tablet Take 80 mg by mouth daily.    Radiology:  No results found.  Cardiac Studies:   Lexiscan myoview stress test 09/20/2018:  1. Lexiscan stress test was performed. Exercise capacity was not assessed. Stress symptoms included dizziness. Resting blood pressure was 164/108 mmHg and peak effect blood pressure was 174/116 mmHg. The resting and stress electrocardiogram demonstrated atrial fibrillation with rapid ventricular rate, occasional PVC, and normal rest repolarization.  Stress EKG is non diagnostic for ischemia as it is a pharmacologic stress. In addition, no ischemic changes seen at heart rate of 136 bpm.  2. The overall quality of the study is good.  Left ventricular cavity is noted to be enlarged on the rest and stress studies.  Gated SPECT images reveal moderate global decrease in myocardial thickening and wall motion.  The left ventricular ejection fraction was  calculated or visually estimated to be 34%.  REST and STRESS images demonstrate mildly decreased tracer uptake in the basal inferior, mid inferior and apical inferior segments of the left ventricle, with no reversibility. Findings suggestive of dilated nonishcemic cardiomyopathy.  3. High risk study due to reduced LVED. Recommend clinical correlation.   PCV ECHOCARDIOGRAM COMPLETE 81/07/7508 Normal LV systolic function with visual EF 50-55%. Left ventricle cavity is normal in size. Mild left ventricular hypertrophy. Normal global wall motion. Doppler evidence of grade II  diastolic dysfunction, normal LAP. Left atrial cavity is moderately dilated. Mild (Grade I) mitral regurgitation. Mild tricuspid regurgitation. Mild pulmonary hypertension. RVSP measures 49 mmHg. Moderate pulmonic regurgitation. IVC is dilated with a respiratory response of >50%. Compared to study 02/11/2019  normal diastolic filling pattern is now G2DD, mild LAE is now moderate, mild PR is now moderate, PHTN is new finding, otherwise no significant change.  EKG:  08/10/2021: Sinus rhythm at a rate of 59 bpm. Left axis, LAFB. IVCD.   04/21/2021: Sinus rhythm at a rate of 75 bpm with frequent PVCs and quadrimeny pattern.  Left axis, left anterior fascicular block.  Incomplete right bundle branch block.  Compared to EKG 10/20/2020, no significant change.  EKG 10/30/2019: Normal sinus rhythm at the rate of 60 bpm, left axis deviation, left anterior fascicular block.  Incomplete right bundle branch block.  Normal QT interval.  No evidence of ischemia.  No significant change from EKG 05/01/2019: No present PVC (2).  Assessment     ICD-10-CM   1. Chronic diastolic heart failure (HCC)  I50.32 EKG [id number]    Basic metabolic panel    2. Paroxysmal atrial fibrillation (HCC)  I48.0 EKG 12-Lead    3. Essential hypertension  I10        Meds ordered this encounter  Medications   sacubitril-valsartan (ENTRESTO) 49-51 MG    Sig:  Take 1 tablet by mouth 2 (two) times daily.    Dispense:  60 tablet    Refill:  3     Medications Discontinued During This Encounter  Medication Reason   isosorbide dinitrate (ISORDIL) 30 MG tablet    telmisartan (MICARDIS) 80 MG tablet Change in therapy     This patients CHA2DS2-VASc Score 3 (Age, HF) and yearly risk of stroke 3.2%.   Recommendations:   DEAVIN FORST  is a 81 y.o. male  with moderate obesity, paroxysmal atrial fibrillation and probably sinus node dysfunction with  sinus bradycardia even on minimal dose of beta-blocker, OSA on CPAP, hyperlipidemia, bronchial asthma and history of excessive alcohol intake.  Patient had developed severe LV systolic dysfunction, he was then started on sotalol on 12/18/2018 and echocardiogram in August 2020 following cardioversion revealed normalization of LVEF.  Patient presents for 29-monthfollow-up.  Patient has had no known recurrence of atrial fibrillation and continues to tolerate anticoagulation without bleeding diathesis. Patient's blood pressure is uncontrolled at today's visit. He admits to some dietary non-compliance, therefore advised patient regarding DASH diet as well as the importance of weight loss. Also reiterated the importance of discontinuing alcohol use.   Due to uncontrolled hypertension and diastolic heart failure will wash out telmisartan for 3 days and transition patient to Entresto 49/51 mg twice daily with repeat BMP in 1 week. Patient will monitor blood pressure daily at home and notify our office if blood pressure remains >130/80 mmHg. Physical exam and EKG remain stable.   Follow up in 3 months, sooner if needed.    Darren Berthold PA-C 08/10/2021, 11:06 AM Office: [phone]

## 2021-08-23 ENCOUNTER — Other Ambulatory Visit: Payer: Self-pay | Admitting: Student

## 2021-08-23 DIAGNOSIS — I5032 Chronic diastolic (congestive) heart failure: Secondary | ICD-10-CM

## 2021-08-23 LAB — BASIC METABOLIC PANEL
BUN/Creatinine Ratio: 19 (ref 10–24)
BUN: 23 mg/dL (ref 8–27)
CO2: 25 mmol/L (ref 20–29)
Calcium: 8.8 mg/dL (ref 8.6–10.2)
Chloride: 104 mmol/L (ref 96–106)
Creatinine, Ser: 1.2 mg/dL (ref 0.76–1.27)
Glucose: 110 mg/dL — ABNORMAL HIGH (ref 70–99)
Potassium: 5.7 mmol/L — ABNORMAL HIGH (ref 3.5–5.2)
Sodium: 142 mmol/L (ref 134–144)
eGFR: 62 mL/min/{1.73_m2} (ref >59)

## 2021-08-23 MED ORDER — ENTRESTO 49-51 MG PO TABS: 0.5000 | ORAL_TABLET | Freq: Two times a day (BID) | ORAL | 3 refills | Status: DC

## 2021-08-23 NOTE — Progress Notes (Signed)
Called and spoke with patient regarding his recent lab results. Patient voiced understanding on new medication changes on Entresto and will will go have repeat blood work in 1 week after medication changes.

## 2021-09-09 ENCOUNTER — Other Ambulatory Visit: Payer: Self-pay | Admitting: Student

## 2021-09-09 DIAGNOSIS — I5042 Chronic combined systolic (congestive) and diastolic (congestive) heart failure: Secondary | ICD-10-CM

## 2021-09-14 ENCOUNTER — Ambulatory Visit (INDEPENDENT_AMBULATORY_CARE_PROVIDER_SITE_OTHER): Payer: Medicare Other | Admitting: Physician Assistant

## 2021-09-14 ENCOUNTER — Encounter: Payer: Self-pay | Admitting: Physician Assistant

## 2021-09-14 ENCOUNTER — Other Ambulatory Visit: Payer: Self-pay

## 2021-09-14 DIAGNOSIS — Z1283 Encounter for screening for malignant neoplasm of skin: Secondary | ICD-10-CM | POA: Diagnosis not present

## 2021-09-14 DIAGNOSIS — L820 Inflamed seborrheic keratosis: Secondary | ICD-10-CM | POA: Diagnosis not present

## 2021-09-14 DIAGNOSIS — L729 Follicular cyst of the skin and subcutaneous tissue, unspecified: Secondary | ICD-10-CM

## 2021-09-14 DIAGNOSIS — D485 Neoplasm of uncertain behavior of skin: Secondary | ICD-10-CM

## 2021-09-14 DIAGNOSIS — Z86018 Personal history of other benign neoplasm: Secondary | ICD-10-CM | POA: Diagnosis not present

## 2021-09-15 ENCOUNTER — Encounter: Payer: Self-pay | Admitting: Physician Assistant

## 2021-09-15 NOTE — Progress Notes (Signed)
? ?  Follow-Up Visit ?  ?Subjective  ?Darren [name] is a 81 y.o. male who presents for the following: Annual Exam (Personal history of atypical melanocytic proliferation. Left upper back 2013 and solar lentigo 2014 left upper arm.). ? ? ?The following portions of the chart were reviewed this encounter and updated as appropriate:  Tobacco  Allergies  Meds  Problems  Med Hx  Surg Hx  Fam Hx   ?  ? ?Objective  ?Well appearing patient in no apparent distress; mood and affect are within normal limits. ? ?All skin waist up examined. ? ?Right Lower Eyelid ?Thick dark plaque ? ? ? ? ? ?Assessment & Plan  ?Neoplasm of uncertain behavior of skin ?Right Lower Eyelid ? ?Skin / nail biopsy ?Type of biopsy: tangential   ?Informed consent: discussed and consent obtained   ?Timeout: patient name, date of birth, surgical site, and procedure verified   ?Anesthesia: the lesion was anesthetized in a standard fashion   ?Anesthetic:  1% lidocaine w/ epinephrine 1-100,000 local infiltration ?Instrument used: flexible razor blade   ?Hemostasis achieved with: aluminum chloride and electrodesiccation   ?Outcome: patient tolerated procedure well   ?Post-procedure details: wound care instructions given   ? ?Specimen 1 - Surgical pathology ?Differential Diagnosis: seb k cautery only ? ?Check Margins: No ? ? ?No atypical nevi noted at the time of the visit. ? ?I, Ladonya Jerkins, PA-C, have reviewed all documentation's for this visit.  The documentation on 09/15/21 for the exam, diagnosis, procedures and orders are all accurate and complete. ?

## 2021-10-04 LAB — BASIC METABOLIC PANEL
BUN/Creatinine Ratio: 21 (ref 10–24)
BUN: 22 mg/dL (ref 8–27)
CO2: 27 mmol/L (ref 20–29)
Calcium: 9.2 mg/dL (ref 8.6–10.2)
Chloride: 104 mmol/L (ref 96–106)
Creatinine, Ser: 1.05 mg/dL (ref 0.76–1.27)
Glucose: 106 mg/dL — ABNORMAL HIGH (ref 70–99)
Potassium: 4.8 mmol/L (ref 3.5–5.2)
Sodium: 142 mmol/L (ref 134–144)
eGFR: 72 mL/min/{1.73_m2} (ref >59)

## 2021-10-07 ENCOUNTER — Other Ambulatory Visit: Payer: Self-pay | Admitting: Primary Care

## 2021-10-20 ENCOUNTER — Ambulatory Visit: Payer: Medicare Other | Admitting: Student

## 2021-10-24 ENCOUNTER — Other Ambulatory Visit: Payer: Self-pay | Admitting: Student

## 2021-10-24 DIAGNOSIS — I480 Paroxysmal atrial fibrillation: Secondary | ICD-10-CM

## 2021-10-25 NOTE — Telephone Encounter (Signed)
Pt needs a refill.

## 2021-11-04 ENCOUNTER — Other Ambulatory Visit: Payer: Self-pay | Admitting: Student

## 2021-11-04 DIAGNOSIS — I5042 Chronic combined systolic (congestive) and diastolic (congestive) heart failure: Secondary | ICD-10-CM

## 2021-11-07 ENCOUNTER — Ambulatory Visit: Payer: Medicare Other | Admitting: Student

## 2021-11-07 ENCOUNTER — Encounter: Payer: Self-pay | Admitting: Student

## 2021-11-07 VITALS — BP 146/84 | HR 60 | Temp 97.9°F | Resp 17 | Ht 71.0 in | Wt 268.4 lb

## 2021-11-07 DIAGNOSIS — I5032 Chronic diastolic (congestive) heart failure: Secondary | ICD-10-CM

## 2021-11-07 DIAGNOSIS — I5042 Chronic combined systolic (congestive) and diastolic (congestive) heart failure: Secondary | ICD-10-CM

## 2021-11-07 DIAGNOSIS — I10 Essential (primary) hypertension: Secondary | ICD-10-CM

## 2021-11-07 MED ORDER — HYDRALAZINE HCL 50 MG PO TABS: 50.0000 mg | ORAL_TABLET | Freq: Three times a day (TID) | ORAL | 3 refills | Status: DC

## 2021-11-07 MED ORDER — METOPROLOL TARTRATE 25 MG PO TABS: 25.0000 mg | ORAL_TABLET | Freq: Two times a day (BID) | ORAL | 3 refills | Status: DC

## 2021-11-07 NOTE — Progress Notes (Signed)
? ?Primary Physician/Referring:  [name], MD ? ?Patient ID: Darren [name], male    DOB: 1941-01-29, 81 y.o.   MRN: [id number] ? ?Chief Complaint  ?Patient presents with  ? Follow-up  ?  3 month  ? HFpEF  ? PAF  ? ?HPI:   ? ?Darren [name]  is a 81 y.o. male  with moderate obesity, paroxysmal atrial fibrillation and probably sinus node dysfunction with  sinus bradycardia even on minimal dose of beta-blocker, OSA on CPAP, hyperlipidemia, bronchial asthma and history of excessive alcohol intake.  Patient had developed severe LV systolic dysfunction, he was then started on sotalol on 12/18/2018 and echocardiogram in August 2020 following cardioversion revealed normalization of LVEF. ? ?Patient presents for 76-monthfollow-up.  At last office visit given diastolic heart failure and uncontrolled hypertension transition from telmisartan to Entresto 49/51 mg, repeat BMP remained stable.  Patient unfortunately continues to struggle with making dietary changes, admits to high sodium intake.  He also continues to drink several alcoholic beverages nightly.  He is feeling relatively well without specific complaints today.  He brings a written log of home blood pressure readings which remain uncontrolled, however these are elevated compared to office reading today, suspect home monitor needs calibrated.  Patient has continued to gain weight since last office visit.  He is however fairly asymptomatic from a cardiovascular standpoint.  Bilateral leg edema has improved with the use of compression socks.  ? ?Past Medical History:  ?Diagnosis Date  ? Arthritis   ? "maybe a little"  ? Asthma   ? "mild"  ? Atrial fibrillation ([facility]   ? Atypical mole 08/16/2011  ? left upper back tx exc atypical prol.  ? Atypical mole 12/18/2012  ? solar lentigo atypical tx w/s  ? bilateral leg edema 09/11/2018  ? Cancer ([facility]   ? prostate cancer  ? Dyspnea   ? Dyspnea on exertion 09/11/2018  ? Dysrhythmia   ? Afib with rapid ventricular  ? Essential  hypertension 07/25/2018  ? Fatty liver   ? GERD (gastroesophageal reflux disease)   ? GI bleed 2018  ? Gout   ? History of blood transfusion 2018  ? Obstructive sleep apnea 09/11/2018  ? OSA (obstructive sleep apnea)   ? Paroxysmal atrial fibrillation ([facility] 09/11/2018  ? Pre-diabetes   ? ?Past Surgical History:  ?Procedure Laterality Date  ? CARDIOVERSION N/A 10/08/2018  ? Procedure: CARDIOVERSION;  Surgeon: GAdrian Prows MD;  [place] [facility]  Service: Cardiovascular;  Laterality: N/A;  ? CARDIOVERSION N/A 01/17/2019  ? Procedure: CARDIOVERSION;  Surgeon: GAdrian Prows MD;  [place] [facility]  Service: Cardiovascular;  Laterality: N/A;  ? COLONOSCOPY Left 03/10/2017  ? Procedure: COLONOSCOPY;  Surgeon: OArta Silence MD;  [place] [facility]  Service: Endoscopy;  Laterality: Left;  ? ESOPHAGOGASTRODUODENOSCOPY (EGD) WITH PROPOFOL Left 03/09/2017  ? Procedure: ESOPHAGOGASTRODUODENOSCOPY (EGD) WITH PROPOFOL;  Surgeon: KRonnette Juniper MD;  [place] [facility]  Service: Gastroenterology;  Laterality: Left;  ? EYE SURGERY Bilateral   ? cataract   ? FINGER SURGERY Right   ? 5th  ? Prostate    ? DaVinci  ? ?Family History  ?Problem Relation Age of Onset  ? Emphysema Father   ?     smoked  ? ?Social History  ? ?Tobacco Use  ? Smoking status: Former  ?  Packs/day: 1.00  ?  Years: 25.00  ?  Pack years: 25.00  ?  Types: Cigarettes  ?  Start date: 89 ?  Quit  date: 07/10/1988  ?  Years since quitting: 33.3  ? Smokeless tobacco: Never  ?Substance Use Topics  ? Alcohol use: Yes  ?  Alcohol/week: 25.0 standard drinks  ?  Types: 25 Shots of liquor per week  ?  Comment: Daily   ? ?Marital Status: Married ? ?ROS  ?Review of Systems  ?Cardiovascular:  Positive for dyspnea on exertion (chronic, stable) and leg swelling (chronic, mild improvement). Negative for chest pain, claudication, near-syncope, orthopnea, palpitations, paroxysmal nocturnal dyspnea and syncope.  ?Neurological:  Negative for dizziness.  ? ?Objective   ?Blood pressure (!) 146/84, pulse 60, temperature 97.9 ?F (36.6 ?C), temperature source Temporal, resp. rate 17, height 5' 11"  (1.803 m), weight 268 lb 6.4 oz (121.7 kg), SpO2 95 %.  ? ?  11/07/2021  ? 10:28 AM 11/07/2021  ? 10:16 AM 08/10/2021  ? 10:33 AM  ?Vitals with BMI  ?Height  5' 11"    ?Weight  268 lbs 6 oz   ?BMI  37.45   ?Systolic 585 277 824  ?Diastolic 84 83 88  ?Pulse 60 59 58  ?  ? Physical Exam ?Vitals reviewed.  ?Constitutional:   ?   Appearance: He is well-developed. He is obese.  ?   Comments: Moderately obese  ?Neck:  ?   Vascular: No JVD.  ?Cardiovascular:  ?   Rate and Rhythm: Normal rate and regular rhythm.  ?   Pulses:     ?     Carotid pulses are 2+ on the right side and 2+ on the left side. ?     Dorsalis pedis pulses are 2+ on the right side and 2+ on the left side.  ?     Posterior tibial pulses are 2+ on the right side and 2+ on the left side.  ?   Heart sounds: Normal heart sounds. No murmur heard. ?  No gallop.  ?   Comments: Femoral and popliteal pulse difficult to feel due to patient's body habitus. No JVD ?Pulmonary:  ?   Effort: Pulmonary effort is normal.  ?   Breath sounds: Normal breath sounds.  ?Musculoskeletal:  ?   Right lower leg: Edema (1-2+ pitting) present.  ?   Left lower leg: Edema (1-2+ pitting ) present.  ?   Comments: Wearing knee-high compression socks bilaterally.  ? ?Laboratory examination:  ? ?Recent Labs  ?  05/03/21 ?1050 08/22/21 ?1059 10/03/21 ?1433  ?NA 142 142 142  ?K 5.4* 5.7* 4.8  ?CL 102 104 104  ?CO2 25 25 27   ?GLUCOSE 99 110* 106*  ?BUN 18 23 22   ?CREATININE 1.08 1.20 1.05  ?CALCIUM 9.7 8.8 9.2  ? ?CrCl cannot be calculated (Patient's most recent lab result is older than the maximum 21 days allowed.).  ? ?  Latest Ref Rng & Units 10/03/2021  ?  2:33 PM 08/22/2021  ? 10:59 AM 05/03/2021  ? 10:50 AM  ?CMP  ?Glucose 70 - 99 mg/dL 106   110   99    ?BUN 8 - 27 mg/dL 22   23   18     ?Creatinine 0.76 - 1.27 mg/dL 1.05   1.20   1.08    ?Sodium 134 - 144 mmol/L 142    142   142    ?Potassium 3.5 - 5.2 mmol/L 4.8   5.7   5.4    ?Chloride 96 - 106 mmol/L 104   104   102    ?CO2 20 - 29 mmol/L 27   25  25    ?Calcium 8.6 - 10.2 mg/dL 9.2   8.8   9.7    ? ? ?  Latest Ref Rng & Units 01/17/2019  ?  7:11 AM 10/08/2018  ?  8:46 AM 03/10/2017  ?  3:10 PM  ?CBC  ?Hemoglobin 13.0 - 17.0 g/dL 14.3   16.3   7.5    ?Hematocrit 39.0 - 52.0 % 42.0   48.0   25.8    ? ?Lipid Panel  ?   ?Component Value Date/Time  ? CHOL 182 05/03/2021 1050  ? TRIG 125 05/03/2021 1050  ? HDL 96 05/03/2021 1050  ? [place] 65 05/03/2021 1050  ? ?HEMOGLOBIN A1C ?No results found for: HGBA1C, MPG ?TSH ?No results for input(s): TSH in the last 8760 hours. ? ?External labs:  ?03/11/2020: ?Total cholesterol 165, triglycerides 104, HDL 81, LDL 63. ?Hb 14.4/HCT 45.0, platelets 178, normal indicis. ?Sodium 141, potassium 104, BUN 16, creatinine 0.90, EGFR >60 mL. ? ?A1c 6.1%. ?09/10/2018: HB 15.8/HCT 46.3, mild macrocytosis present with MCV of 100.  Platelet count 154.  Potassium 4.9, BUN 24, creatinine 1.1, eGFR greater than 60 mL.  ALT and AST elevated at 64 and 57 respectively.  Total cholesterol 184, triglycerides 199, HDL 85, LDL 59. ? ?07/12/2017: HB 15.6, HCT 47.8, platelets 155.  Normal indicis.  Potassium 4.8, serum glucose 160 mg, BUN 23, creatinine 1.1.  CMP normal.  Total cholesterol 232, triglycerides 138, HDL 62, LDL 07/10/40.  Non-HDL cholesterol 170.  Uric acid is elevated at 7.8, vitamin D 25 x 1. ? ?Allergies  ? ?Allergies  ?Allergen Reactions  ? Amlodipine   ?  Leg edema  ? Penicillins Hives  ?  Has patient had a PCN reaction causing immediate rash, facial/tongue/throat swelling, SOB or lightheadedness with hypotension: No ?Has patient had a PCN reaction causing severe rash involving mucus membranes or skin necrosis: No ?Has patient had a PCN reaction that required hospitalization: No ?Has patient had a PCN reaction occurring within the last 10 years: No--childhood reaction ?If all of the above answers are  "NO", then may proceed with Cephalosporin use.  ?  ?Medications Prior to Visit:  ? ?Outpatient Medications Prior to Visit  ?Medication Sig Dispense Refill  ? allopurinol (ZYLOPRIM) 100 MG tablet Take 100 mg b

## 2021-11-10 ENCOUNTER — Ambulatory Visit: Payer: Medicare Other | Admitting: Internal Medicine

## 2021-11-14 ENCOUNTER — Ambulatory Visit: Payer: Medicare Other | Admitting: Student

## 2021-11-14 VITALS — BP 128/69 | HR 69

## 2021-11-14 DIAGNOSIS — I10 Essential (primary) hypertension: Secondary | ICD-10-CM

## 2021-11-14 NOTE — Progress Notes (Signed)
Given uncontrolled hypertension at last office visit increase hydralazine from 25 mg to 50 mg p.o. 3 times daily and encouraged DASH diet.  Patient now presents for blood pressure check.  Blood pressure is not well controlled, continue present medications.  Follow-up appointment as previously scheduled. ? ?Today's Vitals  ? 11/14/21 1020  ?BP: 128/69  ?Pulse: 69  ? ?There is no height or weight on file to calculate BMI. ? ? ? ?Alethia Berthold, PA-C ?11/14/2021, 1:53 PM ?Office: [phone] ? ?

## 2021-11-28 ENCOUNTER — Encounter: Payer: Self-pay | Admitting: Internal Medicine

## 2021-11-28 ENCOUNTER — Ambulatory Visit (INDEPENDENT_AMBULATORY_CARE_PROVIDER_SITE_OTHER): Payer: Medicare Other | Admitting: Internal Medicine

## 2021-11-28 DIAGNOSIS — J45991 Cough variant asthma: Secondary | ICD-10-CM | POA: Diagnosis not present

## 2021-11-28 NOTE — Patient Instructions (Addendum)
Omeprazole 20 mg Take 30- 60 min before your first and last meals of the day   GERD (REFLUX)  is an extremely common cause of respiratory symptoms just like yours , many times with no obvious heartburn at all.    It can be treated with medication, but also with lifestyle changes including elevation of the head of your bed (ideally with 6 -8inch blocks under the headboard of your bed),  Smoking cessation, avoidance of late meals, excessive alcohol, and avoid fatty foods, chocolate, peppermint, colas, red wine, and acidic juices such as orange juice.  NO MINT OR MENTHOL PRODUCTS SO NO COUGH DROPS  USE SUGARLESS CANDY INSTEAD (Jolley ranchers or Stover's or Life Savers) or even ice chips will also do - the key is to swallow to prevent all throat clearing. NO OIL BASED VITAMINS - use powdered substitutes.  Avoid fish oil when coughing.   Your entresto is likely contributing to your cough - may need to consider alternative   In meantime you can increase the gabapentin to 4 x daily    Please schedule a follow up visit in 3 months but call sooner if needed

## 2021-11-28 NOTE — Progress Notes (Signed)
Subjective:   Patient ID: Darren [name], male    DOB: 19-Jul-1940   MRN: [id number]    Brief patient profile:  69    yowm quit smoking 1990 with cough that resolved and pattern of recurrent rhinitis since childhood spring > fall with onset of recurrent flares of cough / wheezing around 2005 resolves from days to weeks p prednisone cycle needing up to 4 x on avg  referred to pulmonary clinic 03/30/2016 by Dr   Dr Mellody Drown    History of Present Illness  03/30/2016 1st [place] [facility] office visit/ Kennah Hehr   Chief Complaint  Patient presents with   Pulmonary Consult    Referred by Dr. [name]. Pt c/o cough and wheezing off and on "for years".    last prednisone was 15 days one week prior to OV  Back to baseline = doe x MMRC1 = can walk nl pace, flat grade, can't hurry or go uphills or steps s sob  On proair once each am  rec Plan A = Automatic = Symbicort 80 Take 2 puffs first thing in am and then another 2 puffs about 12 hours later.  Plan B = Backup Only use your albuterol as a rescue medication       02/10/2021  f/u ov/[place] re: AB on dulera 100 2bid and singulair  Chief Complaint  Patient presents with   Follow-up    F/u on asthma. Breathing well, using cpap while sleeping.   Dyspnea:  goes to gym 2-4 x per week s sob, not checking wts  Cough: some pnds/ clear mucus constant daytime throat clearing worse  Sleeping: on left side bed is flat with wedge pillow and cpap  tol fine s noct resp cc  SABA use: none 02: none Covid status:   vax x  3  Rec Gabapetin 100 mg one at bedtime for a week, then twice daily for a week then 3 x daily x for a week then 4 x daily    11/28/2021  f/u ov/J[place] re: AB  maint on dulera 100 / singulair gabapentin 100 mg three times daily   Chief Complaint  Patient presents with   Follow-up    SOB with extreme activity    Dyspnea:  still going to gym 3 x per week not much treadmill ex-  can walk to stop sign 100 yards sev times a week s sob.  Cough:  sense of pnds / globus sensation > clear mucus minimal  Sleeping: on cpap / lots of sneezing  SABA use: none  02: none      No obvious day to day or daytime variability or assoc excess/ purulent sputum or mucus plugs or hemoptysis or cp or chest tightness, subjective wheeze or overt sinus or hb symptoms.    Also denies any obvious fluctuation of symptoms with weather or environmental changes or other aggravating or alleviating factors except as outlined above   No unusual exposure hx or h/o childhood pna/ asthma or knowledge of premature birth.  Current Allergies, Complete Past Medical History, Past Surgical History, Family History, and Social History were reviewed in [employer] record.  ROS  The following are not active complaints unless bolded Hoarseness, sore throat (globus), dysphagia, dental problems, itching, sneezing,  nasal congestion or discharge of excess mucus or purulent secretions, ear ache,   fever, chills, sweats, unintended wt loss or wt gain, classically pleuritic or exertional cp,  orthopnea pnd or arm/hand swelling  or leg swelling,  presyncope, palpitations, abdominal pain, anorexia, nausea, vomiting, diarrhea  or change in bowel habits or change in bladder habits, change in stools or change in urine, dysuria, hematuria,  rash, arthralgias, visual complaints, headache, numbness, weakness or ataxia or problems with walking or coordination,  change in mood or  memory.        Current Meds  Medication Sig   allopurinol (ZYLOPRIM) 100 MG tablet Take 100 mg by mouth daily.    ammonium lactate (AMLACTIN) 12 % cream Apply 1 g topically as needed for dry skin (scaly feet).   atorvastatin (LIPITOR) 40 MG tablet Take 40 mg by mouth daily.    Cholecalciferol (VITAMIN D-3 PO) Take 2,000 Units by mouth daily.    colchicine 0.6 MG tablet Take 0.6 mg by mouth daily as needed (gout).    colesevelam (WELCHOL) 625 MG tablet Take 1,875 mg by mouth 2 (two) times daily  with a meal.    DULERA 100-5 MCG/ACT AERO USE 2 INHALATIONS TWICE A DAY   ELIQUIS 5 MG TABS tablet TAKE 1 TABLET TWICE A DAY   gabapentin (NEURONTIN) 100 MG capsule TAKE 1 CAPSULE BY MOUTH 3 TO 4 TIMES A DAY   hydrALAZINE (APRESOLINE) 50 MG tablet Take 1 tablet (50 mg total) by mouth 3 (three) times daily.   isosorbide mononitrate (IMDUR) 120 MG 24 hr tablet Take 120 mg by mouth daily.   L-Methylfolate-Algae-B12-B6 (METANX) 3-90.314-2-35 MG CAPS Take 1 tablet by mouth 2 (two) times daily.    Magnesium 400 MG TABS Take 400 mg by mouth daily at 12 noon.    metoprolol tartrate (LOPRESSOR) 25 MG tablet Take 1 tablet (25 mg total) by mouth 2 (two) times daily.   montelukast (SINGULAIR) 10 MG tablet TAKE 1 TABLET BY MOUTH AT BEDTIME   omeprazole (PRILOSEC) 20 MG capsule Take 20 mg by mouth 2 (two) times daily before a meal.   Polyethyl Glycol-Propyl Glycol (SYSTANE OP) Place 1 drop into both eyes 3 (three) times daily as needed (dry eyes).   sacubitril-valsartan (ENTRESTO) 49-51 MG Take 0.5 tablets by mouth 2 (two) times daily.   sotalol (BETAPACE) 80 MG tablet TAKE 1/2 TABLET (40 MG TOTAL) BY MOUTH 2 TIMES DAILY.   thiamine (VITAMIN B-1) 100 MG tablet Take 100 mg by mouth daily.                    Objective:   Physical Exam    11/28/2021       268   02/10/2021         263  02/11/2020         262  07/24/2018       262  01/21/2018       265  07/17/2017          258  03/29/2017        241 01/09/2017          263  11/28/2016        267  09/06/16         273   06/07/16 267 lb (121.1 kg)  05/10/16 264 lb (119.7 kg)  03/30/16 261 lb (118.4 kg)     Vital signs reviewed  11/28/2021  - Note at rest 02 sats  95% on RA   General appearance:    amb obese wm nad with vigorous throat clearing right p normal exam of oropharynx    HEENT : Oropharynx  clear  Nasal turbintes mod edema    NECK :  without  appent JVD/ palpable Nodes/TM    LUNGS: no acc muscle use,  Nl contour chest which is clear to A  and P bilaterally without cough on insp or exp maneuvers   CV:  RRR  no s3 or murmur or increase in P2, and  1+ LE pitting  edema   ABD:  obese soft and nontender with nl inspiratory excursion in the supine position. No bruits or organomegaly appreciated   MS:  Nl gait/ ext warm without deformities Or obvious joint restrictions  calf tenderness, cyanosis or clubbing     SKIN: warm and dry without lesions    NEURO:  alert, approp, nl sensorium with  no motor or cerebellar deficits apparent.                Assessment & Plan:

## 2021-11-28 NOTE — Assessment & Plan Note (Signed)
Complicated by hbp/ osa /hyperlipidemia   Body mass index is 37.49 kg/m.  -  trending up No results found for: TSH    Contributing to doe and risk of GERD >>>   reviewed the need and the process to achieve and maintain neg calorie balance > defer f/u primary care including intermittently monitoring thyroid status    Advised to check sats at peak ex to assure > 90% and work toward 30 min daily as tol          Each maintenance medication was reviewed in detail including emphasizing most importantly the difference between maintenance and prns and under what circumstances the prns are to be triggered using an action plan format where appropriate.  Total time for H and P, chart review, counseling, reviewing hfa device(s) and generating customized AVS unique to this office visit / same day charting  > 30 min

## 2021-11-28 NOTE — Assessment & Plan Note (Addendum)
Onset around 2005  FENO 03/30/2016  =   47 on just saba  - 03/30/2016  After extensive coaching HFA effectiveness =    90% :  Try symbicort 80 2bid x 6 weeks then return with repeat spirometry >  04/06/2016 notified insurance prefers advair > try 115 hfa 2 bid   - Allergy profile 05/10/2016 >  Eos 0.4 /  IgE 60 Pos Grass/ cedar trees   - 05/10/2016 changed back to 4 weeks of symb 80 p flared on advair - FENO 06/07/2016  =   10 on symb 80 2bid  - Spirometry 06/07/2016  FEV1 1.89 (58%)  Ratio  71   - 09/06/2016   try dulera 200 2bid - 11/28/2016 excess shaking on duelra 200 2bid so try one bid and off gerd rx since is not convinced worked - Sinus CT 11/29/2016 >>> Mucosal edema in the maxillary and ethmoid sinuses bilaterally. No air-fluid level. - Singulair 10 mg started first week in Sept 2018   - 07/17/2017   rechallenge with the dulera 100  - 07/24/2018  After extensive coaching inhaler device,  effectiveness =    90% but some "congestion" since started labetolol  - 02/10/2021 trial of gabapentin titrate up to 100 mg qid for incessant daytime throat clearing-  - still clearing throat on tid gabpentin so rec 100 qid, max gerd rx and consider trial off entresto   Upper airway cough syndrome (previously labeled PNDS),  is so named because it's frequently impossible to sort out how much is  CR/sinusitis with freq throat clearing (which can be related to primary GERD)   vs  causing  secondary (" extra esophageal")  GERD from wide swings in gastric pressure that occur with throat clearing, often  promoting self use of mint and menthol lozenges that reduce the lower esophageal sphincter tone and exacerbate the problem further in a cyclical fashion.   These are the same pts (now being labeled as having "irritable larynx syndrome" by some cough centers) who not infrequently have a history of having failed to tolerate ace inhibitors(see below re entresto),  dry powder inhalers or biphosphonates or report having  atypical/extraesophageal reflux symptoms that don't respond to standard doses of PPI  and are easily confused as having aecopd or asthma flares by even experienced allergists/ pulmonologists (myself included).   The sacubitrilat component of entresto is not and ACEi but it does lead to higher levels of bradykinin (the culprit in ACEi related cough) because it reduces Neprilysin based clearance of bradykinin. The typical symptoms are dry daytime cough (9% per PI) or complaints of a new sensation of globus or excess PNDS.   rec Gabapentin 100 g qid Max gerd rx Consider trial off entresto  If worse wheeze may also have to consider statolol substitute on trial basis, a more difficult challenge than entresto which can just be converted to max valsatan x 6 weeks   F/u in 3 m, call sooner prn   .

## 2021-11-29 ENCOUNTER — Other Ambulatory Visit: Payer: Self-pay | Admitting: Student

## 2021-11-29 ENCOUNTER — Telehealth: Payer: Self-pay

## 2021-11-29 DIAGNOSIS — I5032 Chronic diastolic (congestive) heart failure: Secondary | ICD-10-CM

## 2021-11-29 NOTE — Telephone Encounter (Signed)
My concern is that swelling and cough may be related to volume overload rather than the medication itself. Please have patient get labs done (I will place orders) then pending the results of the labs we can determine next steps.

## 2021-11-29 NOTE — Telephone Encounter (Signed)
Spoke to patient he voiced understanding he will go get labs done

## 2021-11-30 LAB — BASIC METABOLIC PANEL
BUN/Creatinine Ratio: 13 (ref 10–24)
BUN: 13 mg/dL (ref 8–27)
CO2: 26 mmol/L (ref 20–29)
Calcium: 9.5 mg/dL (ref 8.6–10.2)
Chloride: 104 mmol/L (ref 96–106)
Creatinine, Ser: 0.97 mg/dL (ref 0.76–1.27)
Glucose: 121 mg/dL — ABNORMAL HIGH (ref 70–99)
Potassium: 4.3 mmol/L (ref 3.5–5.2)
Sodium: 142 mmol/L (ref 134–144)
eGFR: 79 mL/min/{1.73_m2} (ref >59)

## 2021-11-30 LAB — PRO B NATRIURETIC PEPTIDE: NT-Pro BNP: 755 pg/mL — ABNORMAL HIGH (ref 0–486)

## 2021-11-30 MED ORDER — FUROSEMIDE 40 MG PO TABS: 40.0000 mg | ORAL_TABLET | Freq: Every day | ORAL | 3 refills | Status: DC | PRN

## 2021-11-30 NOTE — Addendum Note (Signed)
Addended by: Lawerance Cruel L on: 11/30/2021 03:05 PM   Modules accepted: Orders

## 2021-12-01 NOTE — Progress Notes (Signed)
Called and spoke to pt, pt voiced understanding. He has started the lasix today and is being set up for a 2 week follow up.

## 2021-12-02 ENCOUNTER — Other Ambulatory Visit: Payer: Self-pay | Admitting: Student

## 2021-12-06 ENCOUNTER — Other Ambulatory Visit: Payer: Self-pay

## 2021-12-07 LAB — PRO B NATRIURETIC PEPTIDE: NT-Pro BNP: 449 pg/mL (ref 0–486)

## 2021-12-07 LAB — BASIC METABOLIC PANEL
BUN/Creatinine Ratio: 14 (ref 10–24)
BUN: 17 mg/dL (ref 8–27)
CO2: 25 mmol/L (ref 20–29)
Calcium: 9.2 mg/dL (ref 8.6–10.2)
Chloride: 100 mmol/L (ref 96–106)
Creatinine, Ser: 1.18 mg/dL (ref 0.76–1.27)
Glucose: 159 mg/dL — ABNORMAL HIGH (ref 70–99)
Potassium: 4.3 mmol/L (ref 3.5–5.2)
Sodium: 140 mmol/L (ref 134–144)
eGFR: 63 mL/min/{1.73_m2} (ref >59)

## 2021-12-13 NOTE — Progress Notes (Unsigned)
Primary Physician/Referring:  [name], MD  Patient ID: Darren [name], male    DOB: 07-Sep-1940, 81 y.o.   MRN: [id number]  No chief complaint on file.  HPI:    Darren [name]  is a 81 y.o. male  with moderate obesity, paroxysmal atrial fibrillation and probably sinus node dysfunction with  sinus bradycardia even on minimal dose of beta-blocker, OSA on CPAP, hyperlipidemia, bronchial asthma and history of excessive alcohol intake.  Patient had developed severe LV systolic dysfunction, he was then started on sotalol on 12/18/2018 and echocardiogram in August 2020 following cardioversion revealed normalization of LVEF.  Patient was seen in the office 11/07/21 with uncontrolled hypertension therefore increased hydralazine form 25 mg to 50 mg and at repeat BP check his blood pressure was well controlled. Patient subsequently called the office with concerns of cough and leg swelling, therefore ordered BNP which was elevated. Patient was started on Lasix 40 mg for 5 days, repeat BMP remained stable and Pro-BNP had normalized. Patient now presents for follow up. ***  ***  Patient presents for 67-monthfollow-up.  At last office visit given diastolic heart failure and uncontrolled hypertension transition from telmisartan to Entresto 49/51 mg, repeat BMP remained stable.  Patient unfortunately continues to struggle with making dietary changes, admits to high sodium intake.  He also continues to drink several alcoholic beverages nightly.  He is feeling relatively well without specific complaints today.  He brings a written log of home blood pressure readings which remain uncontrolled, however these are elevated compared to office reading today, suspect home monitor needs calibrated.  Patient has continued to gain weight since last office visit.  He is however fairly asymptomatic from a cardiovascular standpoint.  Bilateral leg edema has improved with the use of compression socks.   Past Medical History:   Diagnosis Date   Arthritis    "maybe a little"   Asthma    "mild"   [place]    Atypical mole 08/16/2011   left upper back tx exc atypical prol.   Atypical mole 12/18/2012   solar lentigo atypical tx w/s   bilateral leg edema 09/11/2018   [place]   Dyspnea    Dyspnea on exertion 09/11/2018   Dysrhythmia    Afib with rapid ventricular   Essential hypertension 07/25/2018   Fatty liver    GERD (gastroesophageal reflux disease)    GI bleed 2018   Gout    History of blood transfusion 2018   Obstructive sleep apnea 09/11/2018   OSA (obstructive sleep apnea)    [place]   Pre-diabetes    Past Surgical History:  Procedure Laterality Date   CARDIOVERSION N/A 10/08/2018   Procedure: CARDIOVERSION;  Surgeon: GAdrian Prows MD;  [facility]  Service: Cardiovascular;  Laterality: N/A;   CARDIOVERSION N/A 01/17/2019   Procedure: CARDIOVERSION;  Surgeon: GAdrian Prows MD;  [place]  Service: Cardiovascular;  Laterality: N/A;   COLONOSCOPY Left 03/10/2017   Procedure: COLONOSCOPY;  Surgeon: OArta Silence MD;  [facility]  Service: Endoscopy;  Laterality: Left;   ESOPHAGOGASTRODUODENOSCOPY (EGD) WITH PROPOFOL Left 03/09/2017   Procedure: ESOPHAGOGASTRODUODENOSCOPY (EGD) WITH PROPOFOL;  Surgeon: KRonnette Juniper MD;  [place] [facility]  Service: Gastroenterology;  Laterality: Left;   EYE SURGERY Bilateral    cataract    FINGER SURGERY Right    5th   Prostate     DaVinci   Family History  Problem  Relation Age of Onset   Emphysema Father        smoked   Social History   Tobacco Use   Smoking status: Former    Packs/day: 1.00    Years: 25.00    Pack years: 25.00    Types: Cigarettes    Start date: 31    Quit date: 07/10/1988    Years since quitting: 33.4   Smokeless tobacco: Never  Substance Use Topics   Alcohol use: Yes    Alcohol/week: 25.0 standard drinks     Types: 25 Shots of liquor per week    Comment: Daily    Marital Status: Married  ROS  Review of Systems  Cardiovascular:  Positive for dyspnea on exertion (chronic, stable) and leg swelling (chronic, mild improvement). Negative for chest pain, claudication, near-syncope, orthopnea, palpitations, paroxysmal nocturnal dyspnea and syncope.  Neurological:  Negative for dizziness.   Objective  There were no vitals taken for this visit.     11/28/2021   11:40 AM 11/14/2021   10:20 AM 11/07/2021   10:28 AM  Vitals with BMI  Weight 268 lbs 13 oz    BMI 50.56    Systolic 979 480 165  Diastolic 62 69 84  Pulse 57 69 60     Physical Exam Vitals reviewed.  Constitutional:      Appearance: He is well-developed. He is obese.     Comments: Moderately obese  Neck:     Vascular: No JVD.  Cardiovascular:     Rate and Rhythm: Normal rate and regular rhythm.     Pulses:          Carotid pulses are 2+ on the right side and 2+ on the left side.      Dorsalis pedis pulses are 2+ on the right side and 2+ on the left side.       Posterior tibial pulses are 2+ on the right side and 2+ on the left side.     Heart sounds: Normal heart sounds. No murmur heard.   No gallop.     Comments: Femoral and popliteal pulse difficult to feel due to patient's body habitus. No JVD Pulmonary:     Effort: Pulmonary effort is normal.     Breath sounds: Normal breath sounds.  Musculoskeletal:     Right lower leg: Edema (1-2+ pitting) present.     Left lower leg: Edema (1-2+ pitting ) present.     Comments: Wearing knee-high compression socks bilaterally.   Laboratory examination:   Recent Labs    10/03/21 1433 11/29/21 1342 12/06/21 1401  NA 142 142 140  K 4.8 4.3 4.3  CL 104 104 100  CO2 27 26 25   GLUCOSE 106* 121* 159*  BUN 22 13 17   CREATININE 1.05 0.97 1.18  CALCIUM 9.2 9.5 9.2    CrCl cannot be calculated (Unknown ideal weight.).     Latest Ref Rng & Units 12/06/2021    2:01 PM 11/29/2021     1:42 PM 10/03/2021    2:33 PM  CMP  Glucose 70 - 99 mg/dL 159   121   106    BUN 8 - 27 mg/dL 17   13   22     Creatinine 0.76 - 1.27 mg/dL 1.18   0.97   1.05    Sodium 134 - 144 mmol/L 140   142   142    Potassium 3.5 - 5.2 mmol/L 4.3   4.3   4.8    Chloride  96 - 106 mmol/L 100   104   104    CO2 20 - 29 mmol/L 25   26   27     Calcium 8.6 - 10.2 mg/dL 9.2   9.5   9.2        Latest Ref Rng & Units 01/17/2019    7:11 AM 10/08/2018    8:46 AM 03/10/2017    3:10 PM  CBC  Hemoglobin 13.0 - 17.0 g/dL 14.3   16.3   7.5    Hematocrit 39.0 - 52.0 % 42.0   48.0   25.8     Lipid Panel     Component Value Date/Time   CHOL 182 05/03/2021 1050   TRIG 125 05/03/2021 1050   HDL 96 05/03/2021 1050   LDLCALC 65 05/03/2021 1050   HEMOGLOBIN A1C No results found for: HGBA1C, MPG TSH No results for input(s): TSH in the last 8760 hours.  BNP    Component Value Date/Time   BNP 83.5 05/03/2021 1050    ProBNP    Component Value Date/Time   PROBNP 449 12/06/2021 1357    External labs:  03/11/2020: Total cholesterol 165, triglycerides 104, HDL 81, LDL 63. Hb 14.4/HCT 45.0, platelets 178, normal indicis. Sodium 141, potassium 104, BUN 16, creatinine 0.90, EGFR >60 mL.  A1c 6.1%. 09/10/2018: HB 15.8/HCT 46.3, mild macrocytosis present with MCV of 100.  Platelet count 154.  Potassium 4.9, BUN 24, creatinine 1.1, eGFR greater than 60 mL.  ALT and AST elevated at 64 and 57 respectively.  Total cholesterol 184, triglycerides 199, HDL 85, LDL 59.  07/12/2017: HB 15.6, HCT 47.8, platelets 155.  Normal indicis.  Potassium 4.8, serum glucose 160 mg, BUN 23, creatinine 1.1.  CMP normal.  Total cholesterol 232, triglycerides 138, HDL 62, LDL 07/10/40.  Non-HDL cholesterol 170.  Uric acid is elevated at 7.8, vitamin D 25 x 1.  Allergies   Allergies  Allergen Reactions   Amlodipine     Leg edema   Penicillins Hives    Has patient had a PCN reaction causing immediate rash, facial/tongue/throat  swelling, SOB or lightheadedness with hypotension: No Has patient had a PCN reaction causing severe rash involving mucus membranes or skin necrosis: No Has patient had a PCN reaction that required hospitalization: No Has patient had a PCN reaction occurring within the last 10 years: No--childhood reaction If all of the above answers are "NO", then may proceed with Cephalosporin use.    Medications Prior to Visit:   Outpatient Medications Prior to Visit  Medication Sig Dispense Refill   allopurinol (ZYLOPRIM) 100 MG tablet Take 100 mg by mouth daily.      ammonium lactate (AMLACTIN) 12 % cream Apply 1 g topically as needed for dry skin (scaly feet).     atorvastatin (LIPITOR) 40 MG tablet Take 40 mg by mouth daily.      Cholecalciferol (VITAMIN D-3 PO) Take 2,000 Units by mouth daily.      colchicine 0.6 MG tablet Take 0.6 mg by mouth daily as needed (gout).      colesevelam (WELCHOL) 625 MG tablet Take 1,875 mg by mouth 2 (two) times daily with a meal.      DULERA 100-5 MCG/ACT AERO USE 2 INHALATIONS TWICE A DAY 39 g 3   ELIQUIS 5 MG TABS tablet TAKE 1 TABLET TWICE A DAY 60 tablet 11   ENTRESTO 49-51 MG TAKE 1 TABLET BY MOUTH 2 TIMES DAILY. 60 tablet 3   furosemide (LASIX) 40 MG  tablet Take 1 tablet (40 mg total) by mouth daily as needed for fluid or edema. 30 tablet 3   gabapentin (NEURONTIN) 100 MG capsule TAKE 1 CAPSULE BY MOUTH 3 TO 4 TIMES A DAY 120 capsule 2   hydrALAZINE (APRESOLINE) 50 MG tablet Take 1 tablet (50 mg total) by mouth 3 (three) times daily. 270 tablet 3   isosorbide mononitrate (IMDUR) 120 MG 24 hr tablet Take 120 mg by mouth daily.     L-Methylfolate-Algae-B12-B6 (METANX) 3-90.314-2-35 MG CAPS Take 1 tablet by mouth 2 (two) times daily.      Magnesium 400 MG TABS Take 400 mg by mouth daily at 12 noon.      metoprolol tartrate (LOPRESSOR) 25 MG tablet Take 1 tablet (25 mg total) by mouth 2 (two) times daily. 180 tablet 3   montelukast (SINGULAIR) 10 MG tablet TAKE 1  TABLET BY MOUTH AT BEDTIME 30 tablet 11   omeprazole (PRILOSEC) 20 MG capsule Take 20 mg by mouth 2 (two) times daily before a meal.     Polyethyl Glycol-Propyl Glycol (SYSTANE OP) Place 1 drop into both eyes 3 (three) times daily as needed (dry eyes).     sotalol (BETAPACE) 80 MG tablet TAKE 1/2 TABLET (40 MG TOTAL) BY MOUTH 2 TIMES DAILY. 45 tablet 3   thiamine (VITAMIN B-1) 100 MG tablet Take 100 mg by mouth daily.     No facility-administered medications prior to visit.   Final Medications at End of Visit    No outpatient medications have been marked as taking for the 12/14/21 encounter (Appointment) with Darren [name], Darren Stelzer C, PA-[name].    Radiology:  No results found.  Cardiac Studies:   Lexiscan myoview stress test 09/20/2018:  1. Lexiscan stress test was performed. Exercise capacity was not assessed. Stress symptoms included dizziness. Resting blood pressure was 164/108 mmHg and peak effect blood pressure was 174/116 mmHg. The resting and stress electrocardiogram demonstrated atrial fibrillation with rapid ventricular rate, occasional PVC, and normal rest repolarization.  Stress EKG is non diagnostic for ischemia as it is a pharmacologic stress. In addition, no ischemic changes seen at heart rate of 136 bpm.  2. The overall quality of the study is good.  Left ventricular cavity is noted to be enlarged on the rest and stress studies.  Gated SPECT images reveal moderate global decrease in myocardial thickening and wall motion.  The left ventricular ejection fraction was calculated or visually estimated to be 34%.  REST and STRESS images demonstrate mildly decreased tracer uptake in the basal inferior, mid inferior and apical inferior segments of the left ventricle, with no reversibility. Findings suggestive of dilated nonishcemic cardiomyopathy.  3. High risk study due to reduced LVED. Recommend clinical correlation.   PCV ECHOCARDIOGRAM COMPLETE 49/70/2637 Normal LV systolic function with  visual EF 50-55%. Left ventricle cavity is normal in size. Mild left ventricular hypertrophy. Normal global wall motion. Doppler evidence of grade II  diastolic dysfunction, normal LAP. Left atrial cavity is moderately dilated. Mild (Grade I) mitral regurgitation. Mild tricuspid regurgitation. Mild pulmonary hypertension. RVSP measures 49 mmHg. Moderate pulmonic regurgitation. IVC is dilated with a respiratory response of >50%. Compared to study 85/88/5027 normal diastolic filling pattern is now G2DD, mild LAE is now moderate, mild PR is now moderate, PHTN is new finding, otherwise no significant change.  EKG:  08/10/2021: Sinus rhythm at a rate of 59 bpm. Left axis, LAFB. IVCD.   04/21/2021: Sinus rhythm at a rate of 75 bpm with frequent PVCs and quadrimeny  pattern.  Left axis, left anterior fascicular block.  Incomplete right bundle branch block.  Compared to EKG 10/20/2020, no significant change.  EKG 10/30/2019: Normal sinus rhythm at the rate of 60 bpm, left axis deviation, left anterior fascicular block.  Incomplete right bundle branch block.  Normal QT interval.  No evidence of ischemia.  No significant change from EKG 05/01/2019: No present PVC (2).  Assessment   No diagnosis found.    No orders of the defined types were placed in this encounter.    There are no discontinued medications.    This patients CHA2DS2-VASc Score 3 (Age, HF) and yearly risk of stroke 3.2%.   Recommendations:   Darren [name]  is a 81 y.o. male  with moderate obesity, paroxysmal atrial fibrillation and probably sinus node dysfunction with  sinus bradycardia even on minimal dose of beta-blocker, OSA on CPAP, hyperlipidemia, bronchial asthma and history of excessive alcohol intake.  Patient had developed severe LV systolic dysfunction, he was then started on sotalol on 12/18/2018 and echocardiogram in August 2020 following cardioversion revealed normalization of LVEF.  Patient was seen in the office  11/07/21 with uncontrolled hypertension therefore increased hydralazine form 25 mg to 50 mg and at repeat BP check his blood pressure was well controlled. Patient subsequently called the office with concerns of cough and leg swelling, therefore ordered BNP which was elevated. Patient was started on Lasix 40 mg for 5 days, repeat BMP remained stable and Pro-BNP had normalized. Patient now presents for follow up. ***  ***  Patient presents for 12-monthfollow-up.  At last office visit given diastolic heart failure and uncontrolled hypertension transition from telmisartan to Entresto 49/51 mg, repeat BMP remained stable.  Given continued uncontrolled hypertension we will increase hydralazine from 25 mg to 50 mg p.o. 3 times daily.  Again reiterated the importance of DASH diet, weight loss, and discontinuing alcohol use.  Patient will come for blood pressure check and bring his home monitor to calibrate in 1 to 2 weeks for nurse visit.  Patient is otherwise stable from a cardiovascular standpoint. Follow up in 6 months, sooner if needed.    CAlethia Berthold PA-[name] 12/13/2021, 1:53 PM Office: [phone]

## 2021-12-14 ENCOUNTER — Encounter: Payer: Self-pay | Admitting: Student

## 2021-12-14 ENCOUNTER — Ambulatory Visit: Payer: Medicare Other | Admitting: Student

## 2021-12-14 VITALS — BP 140/73 | HR 62 | Temp 98.7°F | Resp 16 | Ht 71.0 in | Wt 260.0 lb

## 2021-12-14 DIAGNOSIS — I10 Essential (primary) hypertension: Secondary | ICD-10-CM

## 2021-12-14 DIAGNOSIS — I5032 Chronic diastolic (congestive) heart failure: Secondary | ICD-10-CM

## 2021-12-30 ENCOUNTER — Other Ambulatory Visit: Payer: Self-pay | Admitting: Primary Care

## 2022-01-27 ENCOUNTER — Other Ambulatory Visit: Payer: Self-pay | Admitting: Internal Medicine

## 2022-03-01 ENCOUNTER — Encounter: Payer: Self-pay | Admitting: Internal Medicine

## 2022-03-01 ENCOUNTER — Ambulatory Visit (INDEPENDENT_AMBULATORY_CARE_PROVIDER_SITE_OTHER): Payer: Medicare Other | Admitting: Internal Medicine

## 2022-03-01 DIAGNOSIS — J45991 Cough variant asthma: Secondary | ICD-10-CM

## 2022-03-01 NOTE — Patient Instructions (Addendum)
The sacubitrilat component of entresto is not and ACEi but it does lead to higher levels of bradykinin (the culprit in ACEi related cough) because it reduces Neprilysin based clearance of bradykinin. The typical symptoms are dry daytime cough (9% per PI) or complaints of a new sensation of globus or excess PNDS.   No change in medications for now as your heart a more important part of your problem than the cough and sense of drainage.   Please schedule a follow up visit in 6 months but call sooner if needed

## 2022-03-01 NOTE — Assessment & Plan Note (Signed)
Onset around 2005  FENO 03/30/2016  =   47 on just saba  - 03/30/2016  After extensive coaching HFA effectiveness =    90% :  Try symbicort 80 2bid x 6 weeks then return with repeat spirometry >  04/06/2016 notified insurance prefers advair > try 115 hfa 2 bid   - Allergy profile 05/10/2016 >  Eos 0.4 /  IgE 60 Pos Grass/ cedar trees   - 05/10/2016 changed back to 4 weeks of symb 80 p flared on advair - FENO 06/07/2016  =   10 on symb 80 2bid  - Spirometry 06/07/2016  FEV1 1.89 (58%)  Ratio  71   - 09/06/2016   try dulera 200 2bid - 11/28/2016 excess shaking on duelra 200 2bid so try one bid and off gerd rx since is not convinced worked - Sinus CT 11/29/2016 >>> Mucosal edema in the maxillary and ethmoid sinuses bilaterally. No air-fluid level. - Singulair 10 mg started first week in Sept 2018   - 07/17/2017   rechallenge with the dulera 100  - 07/24/2018  After extensive coaching inhaler device,  effectiveness =    90% but some "congestion" since started labetolol  - 02/10/2021 trial of gabapentin titrate up to 100 mg qid for incessant daytime throat clearing-  - still clearing throat on tid gabpentin so rec 100 qid, max gerd rx and consider trial off entresto   Improved to his satisfaction despite ongoing entresto use so no change in Rx needed - if gets any worse would still consider brief trial off entresto but only with permission by cards and even then with closer surveillance for chf flare  Discussed in detail all the  indications, usual  risks and alternatives  relative to the benefits with patient who agrees to proceed with Rx as outlined.             Each maintenance medication was reviewed in detail including emphasizing most importantly the difference between maintenance and prns and under what circumstances the prns are to be triggered using an action plan format where appropriate.  Total time for H and P, chart review, counseling, reviewing hfa device(s) and generating customized AVS unique  to this office visit / same day charting  > 30 min for multiple  refractory respiratory  symptoms of uncertain etiology

## 2022-03-01 NOTE — Progress Notes (Signed)
Subjective:   Patient ID: Darren [name], male    DOB: 19-Dec-1940   MRN: [id number]    Brief patient profile:  59    yowm quit smoking 1990 with cough that resolved and pattern of recurrent rhinitis since childhood spring > fall with onset of recurrent flares of cough / wheezing around 2005 resolves from days to weeks p prednisone cycle needing up to 4 x on avg  referred to pulmonary clinic 03/30/2016 by Dr   Dr Mellody Drown    History of Present Illness  03/30/2016 1st [facility] office visit/ [name]   Chief Complaint  Patient presents with   Pulmonary Consult    Referred by Dr. [name]. Pt c/o cough and wheezing off and on "for years".    last prednisone was 15 days one week prior to OV  Back to baseline = doe x MMRC1 = can walk nl pace, flat grade, can't hurry or go uphills or steps s sob  On proair once each am  rec Plan A = Automatic = Symbicort 80 Take 2 puffs first thing in am and then another 2 puffs about 12 hours later.  Plan B = Backup Only use your albuterol as a rescue medication       02/10/2021  f/u ov/Cedar Roseman re: AB on dulera 100 2bid and singulair  Chief Complaint  Patient presents with   Follow-up    F/u on asthma. Breathing well, using cpap while sleeping.   Dyspnea:  goes to gym 2-4 x per week s sob, not checking wts  Cough: some pnds/ clear mucus constant daytime throat clearing worse  Sleeping: on left side bed is flat with wedge pillow and cpap  tol fine s noct resp cc  SABA use: none 02: none Covid status:   vax x  3  Rec Gabapetin 100 mg one at bedtime for a week, then twice daily for a week then 3 x daily x for a week then 4 x daily    11/28/2021  f/u ov/[name] re: AB  maint on dulera 100 / singulair gabapentin 100 mg three times daily   Chief Complaint  Patient presents with   Follow-up    SOB with extreme activity    Dyspnea:  still going to gym 3 x per week not much treadmill ex-  can walk to stop sign 100 yards sev times a week s sob.  Cough:  sense of pnds / globus sensation > clear mucus minimal  Sleeping: on cpap / lots of sneezing  SABA use: none  02: none  Rec Omeprazole 20 mg Take 30- 60 min before your first and last meals of the day  GERD diet  Your entresto is likely contributing to your cough - may need to consider alternative  In meantime you can increase the gabapentin to 4 x daily     03/01/2022  f/u ov/[name] re: AB maint on dulera 200 2bid/ singulair   Chief Complaint  Patient presents with   Follow-up    Cough improved but still has post-nasal drip, no c/o with C-Pap  Dyspnea:  still going to gym 3 x week  - able to do treadmill up flat x  2.5-3 lpm limited more by feet legs tired before breathing gives out and sats in 90s Cough: assoc with sense of pnds p stirring min mucoid  Sleeping: cpap does fine s cough  SABA use: none  02: none  Covid status:   vax 2  / infected  x one   No obvious day to day or daytime variability or assoc excess/ purulent sputum or mucus plugs or hemoptysis or cp or chest tightness, subjective wheeze or overt sinus or hb symptoms.   Sleeping on cpap  without nocturnal  or early am exacerbation  of respiratory  c/o's or need for noct saba. Also denies any obvious fluctuation of symptoms with weather or environmental changes or other aggravating or alleviating factors except as outlined above   No unusual exposure hx or h/o childhood pna/ asthma or knowledge of premature birth.  Current Allergies, Complete Past Medical History, Past Surgical History, Family History, and Social History were reviewed in [employer] record.  ROS  The following are not active complaints unless bolded Hoarseness, sore throat, dysphagia, dental problems, itching, sneezing,  nasal congestion or discharge of excess mucus or purulent secretions, ear ache,   fever, chills, sweats, unintended wt loss or wt gain, classically pleuritic or exertional cp,  orthopnea pnd or arm/hand swelling  or  leg swelling, presyncope, palpitations, abdominal pain, anorexia, nausea, vomiting, diarrhea  or change in bowel habits or change in bladder habits, change in stools or change in urine, dysuria, hematuria,  rash, arthralgias, visual complaints, headache, numbness, weakness or ataxia or problems with walking or coordination,  change in mood or  memory.        Current Meds  Medication Sig   allopurinol (ZYLOPRIM) 100 MG tablet Take 100 mg by mouth daily.    ammonium lactate (AMLACTIN) 12 % cream Apply 1 g topically as needed for dry skin (scaly feet).   atorvastatin (LIPITOR) 40 MG tablet Take 40 mg by mouth daily.    Cholecalciferol (VITAMIN D-3 PO) Take 2,000 Units by mouth daily.    colchicine 0.6 MG tablet Take 0.6 mg by mouth daily as needed (gout).    colesevelam (WELCHOL) 625 MG tablet Take 1,875 mg by mouth 2 (two) times daily with a meal.    DULERA 100-5 MCG/ACT AERO USE 2 INHALATIONS TWICE A DAY   ELIQUIS 5 MG TABS tablet TAKE 1 TABLET TWICE A DAY   ENTRESTO 49-51 MG TAKE 1 TABLET BY MOUTH 2 TIMES DAILY.   furosemide (LASIX) 40 MG tablet Take 1 tablet (40 mg total) by mouth daily as needed for fluid or edema. (Patient taking differently: Take 40 mg by mouth daily as needed for fluid or edema. PRN)   gabapentin (NEURONTIN) 100 MG capsule TAKE 1 CAPSULE BY MOUTH 3 TO 4 TIMES A DAY   hydrALAZINE (APRESOLINE) 50 MG tablet Take 1 tablet (50 mg total) by mouth 3 (three) times daily.   isosorbide mononitrate (IMDUR) 120 MG 24 hr tablet Take 120 mg by mouth daily.   L-Methylfolate-Algae-B12-B6 (METANX) 3-90.314-2-35 MG CAPS Take 1 tablet by mouth 2 (two) times daily.    Magnesium 400 MG TABS Take 400 mg by mouth daily at 12 noon.    metoprolol tartrate (LOPRESSOR) 25 MG tablet Take 1 tablet (25 mg total) by mouth 2 (two) times daily.   montelukast (SINGULAIR) 10 MG tablet TAKE 1 TABLET BY MOUTH AT BEDTIME   omeprazole (PRILOSEC) 20 MG capsule Take 20 mg by mouth 2 (two) times daily before a  meal.   Polyethyl Glycol-Propyl Glycol (SYSTANE OP) Place 1 drop into both eyes 3 (three) times daily as needed (dry eyes).   sotalol (BETAPACE) 80 MG tablet TAKE 1/2 TABLET (40 MG TOTAL) BY MOUTH 2 TIMES DAILY.   thiamine (VITAMIN B-1) 100 MG tablet  Take 100 mg by mouth daily.             Objective:   Physical Exam  Wts  03/01/2022       262  11/28/2021       268   02/10/2021         263  02/11/2020         262  07/24/2018       262  01/21/2018       265  07/17/2017          258  03/29/2017        241 01/09/2017          263  11/28/2016        267  09/06/16         273   06/07/16 267 lb (121.1 kg)  05/10/16 264 lb (119.7 kg)  03/30/16 261 lb (118.4 kg)     Vital signs reviewed  03/01/2022  - Note at rest 02 sats  95% on RA   General appearance:    amb obese wm nad        HEENT : Oropharynx  clear     Nasal turbinates nl    NECK :  without  apparent JVD/ palpable Nodes/TM    LUNGS: no acc muscle use,  Nl contour chest which is clear to A and P bilaterally without cough on insp or exp maneuvers   CV:  RRR  no s3 or murmur or increase in P2, and  1+ pitting both LE edema   ABD:  obese soft and nontender with nl inspiratory excursion in the supine position. No bruits or organomegaly appreciated   MS:  Nl gait/ ext warm without deformities Or obvious joint restrictions  calf tenderness, cyanosis or clubbing    SKIN: warm and dry without lesions    NEURO:  alert, approp, nl sensorium with  no motor or cerebellar deficits apparent.         Assessment & Plan:

## 2022-03-20 ENCOUNTER — Other Ambulatory Visit: Payer: Self-pay | Admitting: Internal Medicine

## 2022-03-27 ENCOUNTER — Other Ambulatory Visit: Payer: Self-pay | Admitting: Primary Care

## 2022-03-29 ENCOUNTER — Other Ambulatory Visit: Payer: Self-pay

## 2022-03-29 MED ORDER — FUROSEMIDE 40 MG PO TABS: 40.0000 mg | ORAL_TABLET | Freq: Every day | ORAL | 1 refills | Status: DC | PRN

## 2022-05-10 ENCOUNTER — Ambulatory Visit: Payer: Medicare Other | Admitting: Student

## 2022-05-10 ENCOUNTER — Other Ambulatory Visit: Payer: Self-pay

## 2022-05-10 DIAGNOSIS — I480 Paroxysmal atrial fibrillation: Secondary | ICD-10-CM

## 2022-05-10 MED ORDER — SOTALOL HCL 80 MG PO TABS: ORAL_TABLET | ORAL | 3 refills | Status: DC

## 2022-05-11 ENCOUNTER — Ambulatory Visit: Payer: Medicare Other

## 2022-05-11 VITALS — BP 127/57 | HR 61 | Resp 16 | Ht 71.0 in | Wt 266.8 lb

## 2022-05-11 DIAGNOSIS — I5032 Chronic diastolic (congestive) heart failure: Secondary | ICD-10-CM

## 2022-05-11 DIAGNOSIS — I10 Essential (primary) hypertension: Secondary | ICD-10-CM

## 2022-05-11 DIAGNOSIS — R600 Localized edema: Secondary | ICD-10-CM

## 2022-05-11 DIAGNOSIS — I480 Paroxysmal atrial fibrillation: Secondary | ICD-10-CM

## 2022-05-11 NOTE — Progress Notes (Signed)
Primary Physician/Referring:  [name], MD  Patient ID: Darren [name], male    DOB: 18-Nov-1940, 81 y.o.   MRN: [id number]  Chief Complaint  Patient presents with   Edema   Follow-up    6 month   Atrial Fibrillation   HPI:    Darren [name]  is a 81 y.o. male  with moderate obesity, paroxysmal atrial fibrillation and probably sinus node dysfunction with  sinus bradycardia even on minimal dose of beta-blocker, OSA on CPAP, hyperlipidemia, bronchial asthma and history of excessive alcohol intake.  Patient had developed severe LV systolic dysfunction, he was then started on sotalol on 12/18/2018 and echocardiogram in August 2020 following cardioversion revealed normalization of LVEF.  Patient was last seen in office on 12/14/2021 for follow-up regarding leg edema and cough.  He is currently taking Lasix as needed.  He still has ongoing cough and was told by his pulmonologist this could be related to Eliquis.  He also has acid reflux which could be contributing to his cough.  He does take omeprazole.  He does have bilateral leg edema today.  He has had approximately 5 pound weight gain since last visit.  Does go to the gym 1-4 times per week for 30 minutes.  He brings an extensive log of blood pressure readings with him today and his blood pressure is controlled.  He denies chest pain, shortness of breath, palpitations, leg edema, orthopnea, PND, TIA/syncope.  He reports compliance with his CPAP.  Past Medical History:  Diagnosis Date   Arthritis    "maybe a little"   Asthma    "mild"   Atrial fibrillation ([place])    Atypical mole 08/16/2011   left upper back tx exc atypical prol.   Atypical mole 12/18/2012   solar lentigo atypical tx w/s   bilateral leg edema 09/11/2018   Cancer (HCC)    prostate cancer   Dyspnea    Dyspnea on exertion 09/11/2018   Dysrhythmia    Afib with rapid ventricular   Essential hypertension 07/25/2018   Fatty liver    GERD (gastroesophageal reflux disease)     GI bleed 2018   Gout    History of blood transfusion 2018   Obstructive sleep apnea 09/11/2018   OSA (obstructive sleep apnea)    Paroxysmal atrial fibrillation ([place]) 09/11/2018   Pre-diabetes    Social History   Tobacco Use   Smoking status: Former    Packs/day: 1.00    Years: 25.00    Total pack years: 25.00    Types: Cigarettes    Start date: 78    Quit date: 07/10/1988    Years since quitting: 33.8   Smokeless tobacco: Never  Substance Use Topics   Alcohol use: Yes    Alcohol/week: 25.0 standard drinks of alcohol    Types: 25 Shots of liquor per week    Comment: Daily    Marital Status: Married  ROS  Review of Systems  Cardiovascular:  Positive for dyspnea on exertion (chronic, stable) and leg swelling. Negative for chest pain, claudication, near-syncope, orthopnea, palpitations, paroxysmal nocturnal dyspnea and syncope.  Neurological:  Negative for dizziness.    Objective  Blood pressure (!) 127/57, pulse 61, resp. rate 16, height _0  (1.803 m), weight 266 lb 12.8 oz (121 kg), SpO2 93 %.     05/11/2022   11:15 AM 03/01/2022   10:28 AM 12/14/2021   12:05 PM  Vitals with BMI  Height _1  _2  _3   Weight 266 lbs 13 oz 262 lbs 6 oz 260 lbs  BMI 37.23 90.24 09.73  Systolic 532 992 426  Diastolic 57 70 73  Pulse 61 62 62     Physical Exam Vitals reviewed.  Constitutional:      Appearance: He is well-developed. He is obese.     Comments: Moderately obese  Neck:     Vascular: No JVD.  Cardiovascular:     Rate and Rhythm: Normal rate and regular rhythm.     Pulses:          Carotid pulses are 2+ on the right side and 2+ on the left side.      Dorsalis pedis pulses are 2+ on the right side and 2+ on the left side.       Posterior tibial pulses are 2+ on the right side and 2+ on the left side.     Heart sounds: Normal heart sounds. No murmur heard.    No gallop.     Comments: Femoral and popliteal pulse difficult to feel due to patient's body  habitus. No JVD Pulmonary:     Effort: Pulmonary effort is normal.     Breath sounds: Normal breath sounds.  Musculoskeletal:     Right lower leg: 2+ Pitting Edema present.     Left lower leg: 2+ Pitting Edema present.     Comments: Not wearing compression socks today    Laboratory examination:   Recent Labs    10/03/21 1433 11/29/21 1342 12/06/21 1401  NA 142 142 140  K 4.8 4.3 4.3  CL 104 104 100  CO2 _0 GLUCOSE 106* 121* 159*  BUN _1 CREATININE 1.05 0.97 1.18  CALCIUM 9.2 9.5 9.2   CrCl cannot be calculated (Patient's most recent lab result is older than the maximum 21 days allowed.).     Latest Ref Rng & Units 12/06/2021    2:01 PM 11/29/2021    1:42 PM 10/03/2021    2:33 PM  CMP  Glucose 70 - 99 mg/dL 159  121  106   BUN 8 - 27 mg/dL _2 Creatinine 0.76 - 1.27 mg/dL 1.18  0.97  1.05   Sodium 134 - 144 mmol/L 140  142  142   Potassium 3.5 - 5.2 mmol/L 4.3  4.3  4.8   Chloride 96 - 106 mmol/L 100  104  104   CO2 20 - 29 mmol/L _3 Calcium 8.6 - 10.2 mg/dL 9.2  9.5  9.2       Latest Ref Rng & Units 01/17/2019    7:11 AM 10/08/2018    8:46 AM 03/10/2017    3:10 PM  CBC  Hemoglobin 13.0 - 17.0 g/dL 14.3  16.3  7.5   Hematocrit 39.0 - 52.0 % 42.0  48.0  25.8    Lipid Panel     Component Value Date/Time   CHOL 182 05/03/2021 1050   TRIG 125 05/03/2021 1050   HDL 96 05/03/2021 1050   LDLCALC 65 05/03/2021 1050   HEMOGLOBIN A1C No results found for: "HGBA1C", "MPG" TSH No results for input(s): "TSH" in the last 8760 hours.  BNP    Component Value Date/Time   BNP 83.5 05/03/2021 1050    ProBNP    Component Value Date/Time   PROBNP 449 12/06/2021 1357    External labs:  03/11/2020: Total cholesterol 165, triglycerides 104, HDL 81, LDL 63.  Hb 14.4/HCT 45.0, platelets 178, normal indicis. Sodium 141, potassium 104, BUN 16, creatinine 0.90, EGFR >60 mL.  A1c 6.1%. 09/10/2018: HB 15.8/HCT 46.3, mild macrocytosis present  with MCV of 100.  Platelet count 154.  Potassium 4.9, BUN 24, creatinine 1.1, eGFR greater than 60 mL.  ALT and AST elevated at 64 and 57 respectively.  Total cholesterol 184, triglycerides 199, HDL 85, LDL 59.  07/12/2017: HB 15.6, HCT 47.8, platelets 155.  Normal indicis.  Potassium 4.8, serum glucose 160 mg, BUN 23, creatinine 1.1.  CMP normal.  Total cholesterol 232, triglycerides 138, HDL 62, LDL 07/10/40.  Non-HDL cholesterol 170.  Uric acid is elevated at 7.8, vitamin D 25 x 1.  Allergies   Allergies  Allergen Reactions   Amlodipine     Leg edema   Penicillins Hives    Has patient had a PCN reaction causing immediate rash, facial/tongue/throat swelling, SOB or lightheadedness with hypotension: No Has patient had a PCN reaction causing severe rash involving mucus membranes or skin necrosis: No Has patient had a PCN reaction that required hospitalization: No Has patient had a PCN reaction occurring within the last 10 years: No--childhood reaction If all of the above answers are "NO", then may proceed with Cephalosporin use.    Medications Prior to Visit:   Outpatient Medications Prior to Visit  Medication Sig Dispense Refill   allopurinol (ZYLOPRIM) 100 MG tablet Take 100 mg by mouth daily.      ammonium lactate (AMLACTIN) 12 % cream Apply 1 g topically as needed for dry skin (scaly feet).     atorvastatin (LIPITOR) 40 MG tablet Take 40 mg by mouth daily.      Cholecalciferol (VITAMIN D-3 PO) Take 2,000 Units by mouth daily.      colchicine 0.6 MG tablet Take 0.6 mg by mouth daily as needed (gout).      colesevelam (WELCHOL) 625 MG tablet Take 1,875 mg by mouth 2 (two) times daily with a meal.      DULERA 100-5 MCG/ACT AERO USE 2 INHALATIONS TWICE A DAY 39 g 3   ELIQUIS 5 MG TABS tablet TAKE 1 TABLET TWICE A DAY 60 tablet 11   ENTRESTO 49-51 MG TAKE 1 TABLET BY MOUTH 2 TIMES DAILY. 60 tablet 3   furosemide (LASIX) 40 MG tablet Take 1 tablet (40 mg total) by mouth daily as needed for  fluid or edema. 30 tablet 1   gabapentin (NEURONTIN) 100 MG capsule TAKE 1 CAPSULE BY MOUTH 3 TO 4 TIMES A DAY 120 capsule 2   hydrALAZINE (APRESOLINE) 50 MG tablet Take 1 tablet (50 mg total) by mouth 3 (three) times daily. 270 tablet 3   isosorbide mononitrate (IMDUR) 120 MG 24 hr tablet Take 120 mg by mouth daily.     L-Methylfolate-Algae-B12-B6 (METANX) 3-90.314-2-35 MG CAPS Take 1 tablet by mouth 2 (two) times daily.      Magnesium 400 MG TABS Take 400 mg by mouth daily at 12 noon.      metoprolol tartrate (LOPRESSOR) 25 MG tablet Take 1 tablet (25 mg total) by mouth 2 (two) times daily. 180 tablet 3   montelukast (SINGULAIR) 10 MG tablet TAKE 1 TABLET BY MOUTH AT BEDTIME 30 tablet 11   omeprazole (PRILOSEC) 20 MG capsule Take 20 mg by mouth 2 (two) times daily before a meal.     Polyethyl Glycol-Propyl Glycol (SYSTANE OP) Place 1 drop into both eyes 3 (three) times daily as needed (dry eyes).     sotalol (  BETAPACE) 80 MG tablet TAKE 1/2 TABLET (40 MG TOTAL) BY MOUTH 2 TIMES DAILY. 45 tablet 3   thiamine (VITAMIN B-1) 100 MG tablet Take 100 mg by mouth daily.     No facility-administered medications prior to visit.   Final Medications at End of Visit    Current Meds  Medication Sig   allopurinol (ZYLOPRIM) 100 MG tablet Take 100 mg by mouth daily.    ammonium lactate (AMLACTIN) 12 % cream Apply 1 g topically as needed for dry skin (scaly feet).   atorvastatin (LIPITOR) 40 MG tablet Take 40 mg by mouth daily.    Cholecalciferol (VITAMIN D-3 PO) Take 2,000 Units by mouth daily.    colchicine 0.6 MG tablet Take 0.6 mg by mouth daily as needed (gout).    colesevelam (WELCHOL) 625 MG tablet Take 1,875 mg by mouth 2 (two) times daily with a meal.    DULERA 100-5 MCG/ACT AERO USE 2 INHALATIONS TWICE A DAY   ELIQUIS 5 MG TABS tablet TAKE 1 TABLET TWICE A DAY   ENTRESTO 49-51 MG TAKE 1 TABLET BY MOUTH 2 TIMES DAILY.   furosemide (LASIX) 40 MG tablet Take 1 tablet (40 mg total) by mouth daily  as needed for fluid or edema.   gabapentin (NEURONTIN) 100 MG capsule TAKE 1 CAPSULE BY MOUTH 3 TO 4 TIMES A DAY   hydrALAZINE (APRESOLINE) 50 MG tablet Take 1 tablet (50 mg total) by mouth 3 (three) times daily.   isosorbide mononitrate (IMDUR) 120 MG 24 hr tablet Take 120 mg by mouth daily.   L-Methylfolate-Algae-B12-B6 (METANX) 3-90.314-2-35 MG CAPS Take 1 tablet by mouth 2 (two) times daily.    Magnesium 400 MG TABS Take 400 mg by mouth daily at 12 noon.    metoprolol tartrate (LOPRESSOR) 25 MG tablet Take 1 tablet (25 mg total) by mouth 2 (two) times daily.   montelukast (SINGULAIR) 10 MG tablet TAKE 1 TABLET BY MOUTH AT BEDTIME   omeprazole (PRILOSEC) 20 MG capsule Take 20 mg by mouth 2 (two) times daily before a meal.   Polyethyl Glycol-Propyl Glycol (SYSTANE OP) Place 1 drop into both eyes 3 (three) times daily as needed (dry eyes).   sotalol (BETAPACE) 80 MG tablet TAKE 1/2 TABLET (40 MG TOTAL) BY MOUTH 2 TIMES DAILY.   thiamine (VITAMIN B-1) 100 MG tablet Take 100 mg by mouth daily.    Radiology:  No results found.  Cardiac Studies:   Lexiscan myoview stress test 09/20/2018:  1. Lexiscan stress test was performed. Exercise capacity was not assessed. Stress symptoms included dizziness. Resting blood pressure was 164/108 mmHg and peak effect blood pressure was 174/116 mmHg. The resting and stress electrocardiogram demonstrated atrial fibrillation with rapid ventricular rate, occasional PVC, and normal rest repolarization.  Stress EKG is non diagnostic for ischemia as it is a pharmacologic stress. In addition, no ischemic changes seen at heart rate of 136 bpm.  2. The overall quality of the study is good.  Left ventricular cavity is noted to be enlarged on the rest and stress studies.  Gated SPECT images reveal moderate global decrease in myocardial thickening and wall motion.  The left ventricular ejection fraction was calculated or visually estimated to be 34%.  REST and STRESS images  demonstrate mildly decreased tracer uptake in the basal inferior, mid inferior and apical inferior segments of the left ventricle, with no reversibility. Findings suggestive of dilated nonishcemic cardiomyopathy.  3. High risk study due to reduced LVED. Recommend clinical correlation.  PCV ECHOCARDIOGRAM COMPLETE 35/36/1443 Normal LV systolic function with visual EF 50-55%. Left ventricle cavity is normal in size. Mild left ventricular hypertrophy. Normal global wall motion. Doppler evidence of grade II  diastolic dysfunction, normal LAP. Left atrial cavity is moderately dilated. Mild (Grade I) mitral regurgitation. Mild tricuspid regurgitation. Mild pulmonary hypertension. RVSP measures 49 mmHg. Moderate pulmonic regurgitation. IVC is dilated with a respiratory response of >50%. Compared to study 15/40/0867 normal diastolic filling pattern is now G2DD, mild LAE is now moderate, mild PR is now moderate, PHTN is new finding, otherwise no significant change.  EKG:   EKG 05/11/2022: Sinus bradycardia at rate of 59 bpm.  Left axis deviation.  Left anterior fascicular block.  IVCD.  Compared to previous EKG on 08/10/2021, no significant change.  Assessment     ICD-10-CM   1. Chronic diastolic heart failure (HCC)  Y19.50 Basic Metabolic Panel (BMET)    Pro b natriuretic peptide (BNP)    2. Essential hypertension  I10     3. Paroxysmal atrial fibrillation (HCC)  I48.0 EKG 12-Lead    4. Bilateral leg edema  D32.6 Basic Metabolic Panel (BMET)    Pro b natriuretic peptide (BNP)       No orders of the defined types were placed in this encounter.    There are no discontinued medications.    This patients CHA2DS2-VASc Score 3 (Age, HF) and yearly risk of stroke 3.2%.   Recommendations:   TEOMAN GIRAUD  is a 81 y.o. male  with moderate obesity, paroxysmal atrial fibrillation and probably sinus node dysfunction with  sinus bradycardia even on minimal dose of beta-blocker, OSA on CPAP,  hyperlipidemia, bronchial asthma and history of excessive alcohol intake.  Patient had developed severe LV systolic dysfunction, he was then started on sotalol on 12/18/2018 and echocardiogram in August 2020 following cardioversion revealed normalization of LVEF.  Chronic diastolic heart failure ([place]) He does have bilateral lower extremity edema but no other clinical evidence of heart failure. He is on guideline directed therapy.  No changes to current medications.  Essential hypertension Blood pressures well controlled.  He does bring a extensive list of home blood pressure readings which are also well controlled.  Paroxysmal atrial fibrillation (HCC) Remains in sinus rhythm with no recurrence of A-fib. Tolerating Eliquis without bleeding diathesis.  Bilateral leg edema Advised patient to take Lasix 40 mg daily for the next 5 days. We will recheck BMP and P-BNP in 2 weeks. Encouraged compression stockings while awake. Advised low-sodium diet less than 1500 mg daily. Encouraged lifestyle modifications including weight loss and continued exercise.  Follow up in 6 months as previously scheduled. Sooner if needed.     Ernst Spell, AGNP-C 05/11/2022, 12:13 PM Office: [phone]

## 2022-05-25 ENCOUNTER — Other Ambulatory Visit: Payer: Self-pay | Admitting: Cardiology

## 2022-05-29 ENCOUNTER — Other Ambulatory Visit: Payer: Self-pay

## 2022-05-29 DIAGNOSIS — I480 Paroxysmal atrial fibrillation: Secondary | ICD-10-CM

## 2022-05-29 MED ORDER — APIXABAN 5 MG PO TABS: 5.0000 mg | ORAL_TABLET | Freq: Two times a day (BID) | ORAL | 11 refills | Status: DC

## 2022-06-07 ENCOUNTER — Other Ambulatory Visit: Payer: Self-pay | Admitting: Internal Medicine

## 2022-06-07 ENCOUNTER — Ambulatory Visit
Admission: RE | Admit: 2022-06-07 | Discharge: 2022-06-07 | Disposition: A | Discharge status: Home / Self Care | Payer: Medicare Other | Source: Ambulatory Visit | Attending: Internal Medicine | Admitting: Internal Medicine

## 2022-06-07 ENCOUNTER — Ambulatory Visit: Payer: Medicare Other | Admitting: Internal Medicine

## 2022-06-07 VITALS — BP 132/76 | HR 58 | Resp 20 | Ht 71.0 in | Wt 269.0 lb

## 2022-06-07 DIAGNOSIS — I480 Paroxysmal atrial fibrillation: Secondary | ICD-10-CM

## 2022-06-07 DIAGNOSIS — R0602 Shortness of breath: Secondary | ICD-10-CM

## 2022-06-07 DIAGNOSIS — I5032 Chronic diastolic (congestive) heart failure: Secondary | ICD-10-CM | POA: Insufficient documentation

## 2022-06-07 DIAGNOSIS — I5033 Acute on chronic diastolic (congestive) heart failure: Secondary | ICD-10-CM | POA: Insufficient documentation

## 2022-06-07 NOTE — Progress Notes (Signed)
Primary Physician/Referring:  [name], MD  Patient ID: Darren [name], male    DOB: 08-24-1940, 81 y.o.   MRN: [id number]  Chief Complaint  Patient presents with   Shortness of Breath   Tachycardia   HPI:    Darren [name]  is a 81 y.o. male  with moderate obesity, paroxysmal atrial fibrillation and probably sinus node dysfunction with  sinus bradycardia even on minimal dose of beta-blocker, OSA on CPAP, hyperlipidemia, bronchial asthma and history of excessive alcohol intake.  Patient had developed severe LV systolic dysfunction, Darren [name] was then started on sotalol on 12/18/2018 and echocardiogram in August 2020 following cardioversion revealed normalization of LVEF.  Patient presents for an acute visit due to shortness of breath.  Darren [name] has also noticed his heart rate is faster than usual and Darren [name] is feeling some extra beats.  Today on his EKG patient is in sinus tachycardia with ventricular bigeminy.  Of note his diastolic dysfunction did progressed to grade 2 per his last echocardiogram and patient was still on as needed Lasix.  Darren [name] admits to increased swelling in his legs, wheezing, and significant orthopnea where Darren [name] cannot lay flat at all.  Patient is agreeable to starting his Lasix every day now.  Darren [name] will weigh himself daily and take Lasix twice a day for the rest of this week.  Patient will call on Monday and let us know how Darren [name] is feeling.  Past Medical History:  Diagnosis Date   Arthritis    "maybe a little"   Asthma    "mild"   Atrial fibrillation ([place])    Atypical mole 08/16/2011   left upper back tx exc atypical prol.   Atypical mole 12/18/2012   solar lentigo atypical tx w/s   bilateral leg edema 09/11/2018   Cancer (HCC)    prostate cancer   Dyspnea    Dyspnea on exertion 09/11/2018   Dysrhythmia    Afib with rapid ventricular   Essential hypertension 07/25/2018   Fatty liver    GERD (gastroesophageal reflux disease)    GI bleed 2018   Gout    History of blood  transfusion 2018   Obstructive sleep apnea 09/11/2018   OSA (obstructive sleep apnea)    Paroxysmal atrial fibrillation ([place]) 09/11/2018   Pre-diabetes    Social History   Tobacco Use   Smoking status: Former    Packs/day: 1.00    Years: 25.00    Total pack years: 25.00    Types: Cigarettes    Start date: 23    Quit date: 07/10/1988    Years since quitting: 33.9   Smokeless tobacco: Never  Substance Use Topics   Alcohol use: Yes    Alcohol/week: 25.0 standard drinks of alcohol    Types: 25 Shots of liquor per week    Comment: Daily    Marital Status: Married  ROS  Review of Systems  Cardiovascular:  Positive for dyspnea on exertion, leg swelling and orthopnea. Negative for chest pain, claudication, near-syncope, palpitations, paroxysmal nocturnal dyspnea and syncope.  Respiratory:  Positive for shortness of breath.   Neurological:  Negative for dizziness.    Objective  Blood pressure 132/76, pulse (!) 58, resp. rate 20, height _0  (1.803 m), weight 269 lb (122 kg), SpO2 93 %.     06/07/2022   10:00 AM 05/11/2022   11:15 AM 03/01/2022   10:28 AM  Vitals with BMI  Height _1  _2  _3   Weight 269 lbs 266  lbs 13 oz 262 lbs 6 oz  BMI 37.53 10.27 25.36  Systolic 644 034 742  Diastolic 76 57 70  Pulse 58 61 62     Physical Exam Vitals reviewed.  Constitutional:      Appearance: Darren [name] is well-developed. Darren [name] is obese.  Neck:     Vascular: No JVD.  Cardiovascular:     Rate and Rhythm: Regular rhythm. Tachycardia present.     Pulses:          Carotid pulses are 2+ on the right side and 2+ on the left side.      Dorsalis pedis pulses are 2+ on the right side and 2+ on the left side.       Posterior tibial pulses are 2+ on the right side and 2+ on the left side.     Heart sounds: Normal heart sounds. No murmur heard.    No gallop.  Pulmonary:     Effort: Pulmonary effort is normal.     Breath sounds: Examination of the right-middle field reveals wheezing.  Examination of the left-middle field reveals wheezing. Examination of the right-lower field reveals decreased breath sounds and wheezing. Examination of the left-lower field reveals decreased breath sounds and wheezing. Decreased breath sounds and wheezing present.  Musculoskeletal:     Right lower leg: 2+ Pitting Edema present.     Left lower leg: 2+ Pitting Edema present.    Laboratory examination:   Recent Labs    10/03/21 1433 11/29/21 1342 12/06/21 1401  NA 142 142 140  K 4.8 4.3 4.3  CL 104 104 100  CO2 _0 GLUCOSE 106* 121* 159*  BUN _1 CREATININE 1.05 0.97 1.18  CALCIUM 9.2 9.5 9.2    CrCl cannot be calculated (Patient's most recent lab result is older than the maximum 21 days allowed.).     Latest Ref Rng & Units 12/06/2021    2:01 PM 11/29/2021    1:42 PM 10/03/2021    2:33 PM  CMP  Glucose 70 - 99 mg/dL 159  121  106   BUN 8 - 27 mg/dL _2 Creatinine 0.76 - 1.27 mg/dL 1.18  0.97  1.05   Sodium 134 - 144 mmol/L 140  142  142   Potassium 3.5 - 5.2 mmol/L 4.3  4.3  4.8   Chloride 96 - 106 mmol/L 100  104  104   CO2 20 - 29 mmol/L _3 Calcium 8.6 - 10.2 mg/dL 9.2  9.5  9.2       Latest Ref Rng & Units 01/17/2019    7:11 AM 10/08/2018    8:46 AM 03/10/2017    3:10 PM  CBC  Hemoglobin 13.0 - 17.0 g/dL 14.3  16.3  7.5   Hematocrit 39.0 - 52.0 % 42.0  48.0  25.8    Lipid Panel     Component Value Date/Time   CHOL 182 05/03/2021 1050   TRIG 125 05/03/2021 1050   HDL 96 05/03/2021 1050   LDLCALC 65 05/03/2021 1050   HEMOGLOBIN A1C No results found for: "HGBA1C", "MPG" TSH No results for input(s): "TSH" in the last 8760 hours.  BNP    Component Value Date/Time   BNP 83.5 05/03/2021 1050    ProBNP    Component Value Date/Time   PROBNP 449 12/06/2021 1357    External labs:  03/11/2020: Total cholesterol 165, triglycerides 104, HDL 81, LDL 63.  Hb 14.4/HCT 45.0, platelets 178, normal indicis. Sodium 141, potassium 104,  BUN 16, creatinine 0.90, EGFR >60 mL.  A1c 6.1%. 09/10/2018: HB 15.8/HCT 46.3, mild macrocytosis present with MCV of 100.  Platelet count 154.  Potassium 4.9, BUN 24, creatinine 1.1, eGFR greater than 60 mL.  ALT and AST elevated at 64 and 57 respectively.  Total cholesterol 184, triglycerides 199, HDL 85, LDL 59.  07/12/2017: HB 15.6, HCT 47.8, platelets 155.  Normal indicis.  Potassium 4.8, serum glucose 160 mg, BUN 23, creatinine 1.1.  CMP normal.  Total cholesterol 232, triglycerides 138, HDL 62, LDL 07/10/40.  Non-HDL cholesterol 170.  Uric acid is elevated at 7.8, vitamin D 25 x 1.  Allergies   Allergies  Allergen Reactions   Amlodipine     Leg edema   Penicillins Hives    Has patient had a PCN reaction causing immediate rash, facial/tongue/throat swelling, SOB or lightheadedness with hypotension: No Has patient had a PCN reaction causing severe rash involving mucus membranes or skin necrosis: No Has patient had a PCN reaction that required hospitalization: No Has patient had a PCN reaction occurring within the last 10 years: No--childhood reaction If all of the above answers are "NO", then may proceed with Cephalosporin use.    Medications Prior to Visit:   Outpatient Medications Prior to Visit  Medication Sig Dispense Refill   allopurinol (ZYLOPRIM) 100 MG tablet Take 100 mg by mouth daily.      ammonium lactate (AMLACTIN) 12 % cream Apply 1 g topically as needed for dry skin (scaly feet).     apixaban (ELIQUIS) 5 MG TABS tablet Take 1 tablet (5 mg total) by mouth 2 (two) times daily. 60 tablet 11   atorvastatin (LIPITOR) 40 MG tablet Take 40 mg by mouth daily.      Cholecalciferol (VITAMIN D-3 PO) Take 2,000 Units by mouth daily.      colchicine 0.6 MG tablet Take 0.6 mg by mouth daily as needed (gout).      colesevelam (WELCHOL) 625 MG tablet Take 1,875 mg by mouth 2 (two) times daily with a meal.      DULERA 100-5 MCG/ACT AERO USE 2 INHALATIONS TWICE A DAY 39 g 3   ENTRESTO  49-51 MG TAKE 1 TABLET BY MOUTH 2 TIMES DAILY. 60 tablet 3   furosemide (LASIX) 40 MG tablet TAKE 1 TABLET (40 MG TOTAL) BY MOUTH DAILY AS NEEDED FOR FLUID OR EDEMA. 30 tablet 1   gabapentin (NEURONTIN) 100 MG capsule TAKE 1 CAPSULE BY MOUTH 3 TO 4 TIMES A DAY 120 capsule 2   hydrALAZINE (APRESOLINE) 50 MG tablet Take 1 tablet (50 mg total) by mouth 3 (three) times daily. 270 tablet 3   isosorbide mononitrate (IMDUR) 120 MG 24 hr tablet Take 120 mg by mouth daily.     L-Methylfolate-Algae-B12-B6 (METANX) 3-90.314-2-35 MG CAPS Take 1 tablet by mouth 2 (two) times daily.      Magnesium 400 MG TABS Take 400 mg by mouth daily at 12 noon.      metoprolol tartrate (LOPRESSOR) 25 MG tablet Take 1 tablet (25 mg total) by mouth 2 (two) times daily. 180 tablet 3   montelukast (SINGULAIR) 10 MG tablet TAKE 1 TABLET BY MOUTH AT BEDTIME 30 tablet 11   omeprazole (PRILOSEC) 20 MG capsule Take 20 mg by mouth 2 (two) times daily before a meal.     Polyethyl Glycol-Propyl Glycol (SYSTANE OP) Place 1 drop into both eyes 3 (three) times daily as needed (  dry eyes).     sotalol (BETAPACE) 80 MG tablet TAKE 1/2 TABLET (40 MG TOTAL) BY MOUTH 2 TIMES DAILY. 45 tablet 3   thiamine (VITAMIN B-1) 100 MG tablet Take 100 mg by mouth daily.     No facility-administered medications prior to visit.   Final Medications at End of Visit    Current Meds  Medication Sig   allopurinol (ZYLOPRIM) 100 MG tablet Take 100 mg by mouth daily.    ammonium lactate (AMLACTIN) 12 % cream Apply 1 g topically as needed for dry skin (scaly feet).   apixaban (ELIQUIS) 5 MG TABS tablet Take 1 tablet (5 mg total) by mouth 2 (two) times daily.   atorvastatin (LIPITOR) 40 MG tablet Take 40 mg by mouth daily.    Cholecalciferol (VITAMIN D-3 PO) Take 2,000 Units by mouth daily.    colchicine 0.6 MG tablet Take 0.6 mg by mouth daily as needed (gout).    colesevelam (WELCHOL) 625 MG tablet Take 1,875 mg by mouth 2 (two) times daily with a meal.     DULERA 100-5 MCG/ACT AERO USE 2 INHALATIONS TWICE A DAY   ENTRESTO 49-51 MG TAKE 1 TABLET BY MOUTH 2 TIMES DAILY.   furosemide (LASIX) 40 MG tablet TAKE 1 TABLET (40 MG TOTAL) BY MOUTH DAILY AS NEEDED FOR FLUID OR EDEMA.   gabapentin (NEURONTIN) 100 MG capsule TAKE 1 CAPSULE BY MOUTH 3 TO 4 TIMES A DAY   hydrALAZINE (APRESOLINE) 50 MG tablet Take 1 tablet (50 mg total) by mouth 3 (three) times daily.   isosorbide mononitrate (IMDUR) 120 MG 24 hr tablet Take 120 mg by mouth daily.   L-Methylfolate-Algae-B12-B6 (METANX) 3-90.314-2-35 MG CAPS Take 1 tablet by mouth 2 (two) times daily.    Magnesium 400 MG TABS Take 400 mg by mouth daily at 12 noon.    metoprolol tartrate (LOPRESSOR) 25 MG tablet Take 1 tablet (25 mg total) by mouth 2 (two) times daily.   montelukast (SINGULAIR) 10 MG tablet TAKE 1 TABLET BY MOUTH AT BEDTIME   omeprazole (PRILOSEC) 20 MG capsule Take 20 mg by mouth 2 (two) times daily before a meal.   Polyethyl Glycol-Propyl Glycol (SYSTANE OP) Place 1 drop into both eyes 3 (three) times daily as needed (dry eyes).   sotalol (BETAPACE) 80 MG tablet TAKE 1/2 TABLET (40 MG TOTAL) BY MOUTH 2 TIMES DAILY.   thiamine (VITAMIN B-1) 100 MG tablet Take 100 mg by mouth daily.    Radiology:  No results found.  Cardiac Studies:   Lexiscan myoview stress test 09/20/2018:  1. Lexiscan stress test was performed. Exercise capacity was not assessed. Stress symptoms included dizziness. Resting blood pressure was 164/108 mmHg and peak effect blood pressure was 174/116 mmHg. The resting and stress electrocardiogram demonstrated atrial fibrillation with rapid ventricular rate, occasional PVC, and normal rest repolarization.  Stress EKG is non diagnostic for ischemia as it is a pharmacologic stress. In addition, no ischemic changes seen at heart rate of 136 bpm.  2. The overall quality of the study is good.  Left ventricular cavity is noted to be enlarged on the rest and stress studies.  Gated SPECT  images reveal moderate global decrease in myocardial thickening and wall motion.  The left ventricular ejection fraction was calculated or visually estimated to be 34%.  REST and STRESS images demonstrate mildly decreased tracer uptake in the basal inferior, mid inferior and apical inferior segments of the left ventricle, with no reversibility. Findings suggestive of dilated nonishcemic cardiomyopathy.  3. High risk study due to reduced LVED. Recommend clinical correlation.   PCV ECHOCARDIOGRAM COMPLETE 23/76/2831 Normal LV systolic function with visual EF 50-55%. Left ventricle cavity is normal in size. Mild left ventricular hypertrophy. Normal global wall motion. Doppler evidence of grade II  diastolic dysfunction, normal LAP. Left atrial cavity is moderately dilated. Mild (Grade I) mitral regurgitation. Mild tricuspid regurgitation. Mild pulmonary hypertension. RVSP measures 49 mmHg. Moderate pulmonic regurgitation. IVC is dilated with a respiratory response of >50%. Compared to study 51/76/1607 normal diastolic filling pattern is now G2DD, mild LAE is now moderate, mild PR is now moderate, PHTN is new finding, otherwise no significant change.  EKG:   EKG 05/11/2022: Sinus bradycardia at rate of 59 bpm.  Left axis deviation.  Left anterior fascicular block.  IVCD.  Compared to previous EKG on 08/10/2021, no significant change.  06/07/2022: Sinus tachycardia, ventricular bigeminy pattern. Left axis with LAFB. -Diffuse nonspecific T-abnormalit  Assessment     ICD-10-CM   1. Paroxysmal atrial fibrillation (HCC)  I48.0     2. Chronic diastolic heart failure (HCC)  I50.32 EKG 12-Lead       No orders of the defined types were placed in this encounter.    There are no discontinued medications.    This patients CHA2DS2-VASc Score 3 (Age, HTN) and yearly risk of stroke 3.2%.   Recommendations:   Darren [name]  is a 81 y.o. male  with moderate obesity, paroxysmal atrial fibrillation and  probably sinus node dysfunction with  sinus bradycardia even on minimal dose of beta-blocker, OSA on CPAP, hyperlipidemia, bronchial asthma and history of excessive alcohol intake.  Patient had developed severe LV systolic dysfunction, Darren [name] was then started on sotalol on 12/18/2018 and echocardiogram in August 2020 following cardioversion revealed normalization of LVEF.  Chronic diastolic heart failure (HCC) Currently mildly decompensated due to edema, orthopnea, and SOB New lasix regiment discussed with pt CXR ordered  Essential hypertension Blood pressures well controlled.  Darren [name] does bring a extensive list of home blood pressure readings which are also well controlled.  Paroxysmal atrial fibrillation (HCC) Remains in sinus rhythm with no recurrence of A-fib. ST with bigeminy today Tolerating Eliquis without bleeding diathesis.  Bilateral leg edema Advised patient to take Lasix 40 mg BID through this week Darren [name] should start taking lasix everyday rather than PRN starting this Saturday Chest XR ordered We will recheck BMP and P-BNP in 1 weeks. Encouraged compression stockings while awake. Advised low-sodium diet less than 1500 mg daily. Encouraged lifestyle modifications including weight loss and continued exercise.  Call on Monday to update office regarding breathing Follow up in 3 months as previously scheduled. Sooner if needed.     Floydene Flock, DO, [facility] 06/07/2022, 10:41 AM Office: [phone]

## 2022-06-12 ENCOUNTER — Telehealth: Payer: Self-pay

## 2022-06-12 NOTE — Telephone Encounter (Signed)
Patient called and wants to give you a report on the Lasix, he said that it is working and is there anything else you want him to do? Please advise.

## 2022-06-12 NOTE — Telephone Encounter (Signed)
Nope just take daily thank you!

## 2022-06-14 ENCOUNTER — Encounter: Payer: Self-pay | Admitting: Internal Medicine

## 2022-06-14 NOTE — Telephone Encounter (Signed)
From patient.

## 2022-06-14 NOTE — Telephone Encounter (Signed)
I just said ok, nothing important

## 2022-06-15 ENCOUNTER — Inpatient Hospital Stay (HOSPITAL_COMMUNITY)
Admission: EM | Admit: 2022-06-15 | Discharge: 2022-06-20 | DRG: 190 | Disposition: A | Discharge status: Home / Self Care | Payer: Medicare Other | Attending: Family Medicine | Admitting: Family Medicine

## 2022-06-15 ENCOUNTER — Other Ambulatory Visit: Payer: Self-pay

## 2022-06-15 ENCOUNTER — Emergency Department (HOSPITAL_COMMUNITY): Payer: Medicare Other

## 2022-06-15 ENCOUNTER — Encounter (HOSPITAL_COMMUNITY): Payer: Self-pay

## 2022-06-15 DIAGNOSIS — N179 Acute kidney failure, unspecified: Secondary | ICD-10-CM | POA: Diagnosis present

## 2022-06-15 DIAGNOSIS — E875 Hyperkalemia: Secondary | ICD-10-CM | POA: Diagnosis not present

## 2022-06-15 DIAGNOSIS — Z6835 Body mass index (BMI) 35.0-35.9, adult: Secondary | ICD-10-CM

## 2022-06-15 DIAGNOSIS — R062 Wheezing: Secondary | ICD-10-CM

## 2022-06-15 DIAGNOSIS — F1010 Alcohol abuse, uncomplicated: Secondary | ICD-10-CM | POA: Diagnosis present

## 2022-06-15 DIAGNOSIS — I495 Sick sinus syndrome: Secondary | ICD-10-CM | POA: Diagnosis present

## 2022-06-15 DIAGNOSIS — R0902 Hypoxemia: Principal | ICD-10-CM

## 2022-06-15 DIAGNOSIS — K760 Fatty (change of) liver, not elsewhere classified: Secondary | ICD-10-CM | POA: Diagnosis present

## 2022-06-15 DIAGNOSIS — G4733 Obstructive sleep apnea (adult) (pediatric): Secondary | ICD-10-CM | POA: Diagnosis present

## 2022-06-15 DIAGNOSIS — E785 Hyperlipidemia, unspecified: Secondary | ICD-10-CM | POA: Diagnosis present

## 2022-06-15 DIAGNOSIS — Z9841 Cataract extraction status, right eye: Secondary | ICD-10-CM

## 2022-06-15 DIAGNOSIS — E669 Obesity, unspecified: Secondary | ICD-10-CM | POA: Diagnosis present

## 2022-06-15 DIAGNOSIS — I110 Hypertensive heart disease with heart failure: Secondary | ICD-10-CM | POA: Diagnosis present

## 2022-06-15 DIAGNOSIS — N170 Acute kidney failure with tubular necrosis: Secondary | ICD-10-CM

## 2022-06-15 DIAGNOSIS — M109 Gout, unspecified: Secondary | ICD-10-CM | POA: Diagnosis present

## 2022-06-15 DIAGNOSIS — Z87891 Personal history of nicotine dependence: Secondary | ICD-10-CM

## 2022-06-15 DIAGNOSIS — Z1152 Encounter for screening for COVID-19: Secondary | ICD-10-CM

## 2022-06-15 DIAGNOSIS — J441 Chronic obstructive pulmonary disease with (acute) exacerbation: Secondary | ICD-10-CM | POA: Diagnosis not present

## 2022-06-15 DIAGNOSIS — D649 Anemia, unspecified: Secondary | ICD-10-CM | POA: Diagnosis present

## 2022-06-15 DIAGNOSIS — Z825 Family history of asthma and other chronic lower respiratory diseases: Secondary | ICD-10-CM

## 2022-06-15 DIAGNOSIS — Z9842 Cataract extraction status, left eye: Secondary | ICD-10-CM

## 2022-06-15 DIAGNOSIS — R0602 Shortness of breath: Secondary | ICD-10-CM | POA: Diagnosis not present

## 2022-06-15 DIAGNOSIS — Z7901 Long term (current) use of anticoagulants: Secondary | ICD-10-CM

## 2022-06-15 DIAGNOSIS — I480 Paroxysmal atrial fibrillation: Secondary | ICD-10-CM | POA: Diagnosis present

## 2022-06-15 DIAGNOSIS — R7303 Prediabetes: Secondary | ICD-10-CM | POA: Diagnosis present

## 2022-06-15 DIAGNOSIS — I10 Essential (primary) hypertension: Secondary | ICD-10-CM | POA: Diagnosis present

## 2022-06-15 DIAGNOSIS — Z8546 Personal history of malignant neoplasm of prostate: Secondary | ICD-10-CM

## 2022-06-15 DIAGNOSIS — J9601 Acute respiratory failure with hypoxia: Secondary | ICD-10-CM | POA: Diagnosis present

## 2022-06-15 DIAGNOSIS — I5033 Acute on chronic diastolic (congestive) heart failure: Secondary | ICD-10-CM | POA: Diagnosis present

## 2022-06-15 DIAGNOSIS — I5032 Chronic diastolic (congestive) heart failure: Secondary | ICD-10-CM | POA: Diagnosis present

## 2022-06-15 DIAGNOSIS — Z79899 Other long term (current) drug therapy: Secondary | ICD-10-CM

## 2022-06-15 DIAGNOSIS — R051 Acute cough: Secondary | ICD-10-CM

## 2022-06-15 DIAGNOSIS — K219 Gastro-esophageal reflux disease without esophagitis: Secondary | ICD-10-CM | POA: Diagnosis present

## 2022-06-15 LAB — CBC WITH DIFFERENTIAL/PLATELET
Abs Immature Granulocytes: 0.14 10*3/uL — ABNORMAL HIGH (ref 0.00–0.07)
Basophils Absolute: 0.1 10*3/uL (ref 0.0–0.1)
Basophils Relative: 1 %
Eosinophils Absolute: 0.1 10*3/uL (ref 0.0–0.5)
Eosinophils Relative: 1 %
HCT: 39 % (ref 39.0–52.0)
Hemoglobin: 12.9 g/dL — ABNORMAL LOW (ref 13.0–17.0)
Immature Granulocytes: 2 %
Lymphocytes Relative: 11 %
Lymphs Abs: 0.9 10*3/uL (ref 0.7–4.0)
MCH: 34.8 pg — ABNORMAL HIGH (ref 26.0–34.0)
MCHC: 33.1 g/dL (ref 30.0–36.0)
MCV: 105.1 fL — ABNORMAL HIGH (ref 80.0–100.0)
Monocytes Absolute: 0.6 10*3/uL (ref 0.1–1.0)
Monocytes Relative: 8 %
Neutro Abs: 6.1 10*3/uL (ref 1.7–7.7)
Neutrophils Relative %: 77 %
Platelets: 222 10*3/uL (ref 150–400)
RBC: 3.71 MIL/uL — ABNORMAL LOW (ref 4.22–5.81)
RDW: 12.7 % (ref 11.5–15.5)
WBC: 7.8 10*3/uL (ref 4.0–10.5)
nRBC: 0.3 % — ABNORMAL HIGH (ref 0.0–0.2)

## 2022-06-15 LAB — COMPREHENSIVE METABOLIC PANEL
ALT: 15 U/L (ref 0–44)
AST: 18 U/L (ref 15–41)
Albumin: 3.1 g/dL — ABNORMAL LOW (ref 3.5–5.0)
Alkaline Phosphatase: 92 U/L (ref 38–126)
Anion gap: 11 (ref 5–15)
BUN: 63 mg/dL — ABNORMAL HIGH (ref 8–23)
CO2: 23 mmol/L (ref 22–32)
Calcium: 8.8 mg/dL — ABNORMAL LOW (ref 8.9–10.3)
Chloride: 100 mmol/L (ref 98–111)
Creatinine, Ser: 3.83 mg/dL — ABNORMAL HIGH (ref 0.61–1.24)
GFR, Estimated: 15 mL/min — ABNORMAL LOW (ref >60)
Glucose, Bld: 125 mg/dL — ABNORMAL HIGH (ref 70–99)
Potassium: 4.9 mmol/L (ref 3.5–5.1)
Sodium: 134 mmol/L — ABNORMAL LOW (ref 135–145)
Total Bilirubin: 0.3 mg/dL (ref 0.3–1.2)
Total Protein: 6.4 g/dL — ABNORMAL LOW (ref 6.5–8.1)

## 2022-06-15 LAB — LACTIC ACID, PLASMA: Lactic Acid, Venous: 1.5 mmol/L (ref 0.5–1.9)

## 2022-06-15 LAB — TROPONIN I (HIGH SENSITIVITY): Troponin I (High Sensitivity): 16 ng/L (ref <18)

## 2022-06-15 LAB — BRAIN NATRIURETIC PEPTIDE: B Natriuretic Peptide: 621.4 pg/mL — ABNORMAL HIGH (ref 0.0–100.0)

## 2022-06-15 MED ORDER — IPRATROPIUM-ALBUTEROL 0.5-2.5 (3) MG/3ML IN SOLN
3.0000 mL | Freq: Once | RESPIRATORY_TRACT | Status: AC
  Administered 2022-06-15: 3 mL via RESPIRATORY_TRACT
  Filled 2022-06-15: qty 3

## 2022-06-15 MED ORDER — METHYLPREDNISOLONE SODIUM SUCC 125 MG IJ SOLR
125.0000 mg | Freq: Once | INTRAMUSCULAR | Status: AC
  Administered 2022-06-15: 125 mg via INTRAVENOUS
  Filled 2022-06-15: qty 2

## 2022-06-15 NOTE — Telephone Encounter (Signed)
Patient aware.

## 2022-06-15 NOTE — ED Triage Notes (Signed)
Patient arrives via EMS for acute shortness of breath. Patient states that he was coming out of a restaurant and became so short of breath he had to sit on the ground. EMS arrived and patient was satting 75% on room air, pt placed on NRB at 15L and came back up to 100%. Patient was newly diagnosed with CHF last week. Also c/o BL lower extremity swelling.

## 2022-06-15 NOTE — ED Provider Notes (Incomplete)
[facility] EMERGENCY DEPARTMENT Provider Note   CSN: [id number] Arrival date & time: 06/15/22  2105     History {Add pertinent medical, surgical, social history, OB history to HPI:1} Chief Complaint  Patient presents with  . Shortness of Breath    Darren [name] is a 81 y.o. male.   Shortness of Breath      Home Medications Prior to Admission medications   Medication Sig Start Date End Date Taking? Authorizing Provider  allopurinol (ZYLOPRIM) 100 MG tablet Take 100 mg by mouth daily.     [provider]  ammonium lactate (AMLACTIN) 12 % cream Apply 1 g topically as needed for dry skin (scaly feet).    [provider]  apixaban (ELIQUIS) 5 MG TABS tablet Take 1 tablet (5 mg total) by mouth 2 (two) times daily. 05/29/22   Ernst Spell, NP  atorvastatin (LIPITOR) 40 MG tablet Take 40 mg by mouth daily.  05/05/18   [provider]  Cholecalciferol (VITAMIN D-3 PO) Take 2,000 Units by mouth daily.     [provider]  colchicine 0.6 MG tablet Take 0.6 mg by mouth daily as needed (gout).     [provider]  colesevelam (WELCHOL) 625 MG tablet Take 1,875 mg by mouth 2 (two) times daily with a meal.     [provider]  DULERA 100-5 MCG/ACT AERO USE 2 INHALATIONS TWICE A DAY 03/20/22   Tanda Rockers, MD  ENTRESTO 49-51 MG TAKE 1 TABLET BY MOUTH 2 TIMES DAILY. 12/02/21   Cantwell, Celeste C, PA-C  furosemide (LASIX) 40 MG tablet TAKE 1 TABLET (40 MG TOTAL) BY MOUTH DAILY AS NEEDED FOR FLUID OR EDEMA. 05/25/22 05/25/23  [name], MD  gabapentin (NEURONTIN) 100 MG capsule TAKE 1 CAPSULE BY MOUTH 3 TO 4 TIMES A DAY 03/29/22   Tanda Rockers, MD  hydrALAZINE (APRESOLINE) 50 MG tablet Take 1 tablet (50 mg total) by mouth 3 (three) times daily. 11/07/21   Cantwell, Celeste C, PA-C  isosorbide mononitrate (IMDUR) 120 MG 24 hr tablet Take 120 mg by mouth daily. 07/26/21   [provider]   L-Methylfolate-Algae-B12-B6 [place]) 3-90.314-2-35 MG CAPS Take 1 tablet by mouth 2 (two) times daily.     [provider]  Magnesium 400 MG TABS Take 400 mg by mouth daily at 12 noon.     [provider]  metoprolol tartrate (LOPRESSOR) 25 MG tablet Take 1 tablet (25 mg total) by mouth 2 (two) times daily. 11/07/21   Cantwell, Celeste C, PA-C  montelukast (SINGULAIR) 10 MG tablet TAKE 1 TABLET BY MOUTH AT BEDTIME 01/27/22   Tanda Rockers, MD  omeprazole (PRILOSEC) 20 MG capsule Take 20 mg by mouth 2 (two) times daily before a meal.    [provider]  Polyethyl Glycol-Propyl Glycol (SYSTANE OP) Place 1 drop into both eyes 3 (three) times daily as needed (dry eyes).    [provider]  sotalol (BETAPACE) 80 MG tablet TAKE 1/2 TABLET (40 MG TOTAL) BY MOUTH 2 TIMES DAILY. 05/10/22   [name], MD  thiamine (VITAMIN B-1) 100 MG tablet Take 100 mg by mouth daily.    [provider]      Allergies    Amlodipine and Penicillins    Review of Systems   Review of Systems  Respiratory:  Positive for shortness of breath.     Physical Exam Updated Vital Signs BP 110/62   Pulse (!) 58   Temp  98.1 F (36.7 C) (Oral)   Resp 16   Ht '5\' 11"'$  (1.803 m)   Wt 115.2 kg   SpO2 100%   BMI 35.43 kg/m  Physical Exam  ED Results / Procedures / Treatments   Labs (all labs ordered are listed, but only abnormal results are displayed) Labs Reviewed - No data to display  EKG None  Radiology No results found.  Procedures Procedures  {Document cardiac monitor, telemetry assessment procedure when appropriate:1}  Medications Ordered in ED Medications - No data to display  ED Course/ Medical Decision Making/ A&P                           Medical Decision Making  ***  {Document critical care time when appropriate:1} {Document review of labs and clinical decision tools ie heart score, Chads2Vasc2 etc:1}  {Document your independent review of radiology  images, and any outside records:1} {Document your discussion with family members, caretakers, and with consultants:1} {Document social determinants of health affecting pt's care:1} {Document your decision making why or why not admission, treatments were needed:1} Final Clinical Impression(s) / ED Diagnoses Final diagnoses:  None    Rx / DC Orders ED Discharge Orders     None

## 2022-06-15 NOTE — ED Provider Notes (Signed)
[facility] EMERGENCY DEPARTMENT Provider Note   CSN: [id number] Arrival date & time: 06/15/22  2105     History  Chief Complaint  Patient presents with   Shortness of Breath    Darren [name] is a 81 y.o. male.  The history is provided by the patient, medical records, a relative and the spouse. No language interpreter was used.  Shortness of Breath Severity:  Severe Onset quality:  Gradual Duration:  3 days Timing:  Intermittent Progression:  Waxing and waning Chronicity:  New Context: URI   Relieved by:  Oxygen Worsened by:  Exertion and coughing Ineffective treatments:  None tried Associated symptoms: cough, sputum production and wheezing   Associated symptoms: no abdominal pain, no chest pain, no fever, no headaches, no neck pain, no rash and no vomiting   Risk factors: no hx of PE/DVT        Home Medications Prior to Admission medications   Medication Sig Start Date End Date Taking? Authorizing Provider  allopurinol (ZYLOPRIM) 100 MG tablet Take 100 mg by mouth daily.     [provider]  ammonium lactate (AMLACTIN) 12 % cream Apply 1 g topically as needed for dry skin (scaly feet).    [provider]  apixaban (ELIQUIS) 5 MG TABS tablet Take 1 tablet (5 mg total) by mouth 2 (two) times daily. 05/29/22   Ernst Spell, NP  atorvastatin (LIPITOR) 40 MG tablet Take 40 mg by mouth daily.  05/05/18   [provider]  Cholecalciferol (VITAMIN D-3 PO) Take 2,000 Units by mouth daily.     [provider]  colchicine 0.6 MG tablet Take 0.6 mg by mouth daily as needed (gout).     [provider]  colesevelam (WELCHOL) 625 MG tablet Take 1,875 mg by mouth 2 (two) times daily with a meal.     [provider]  DULERA 100-5 MCG/ACT AERO USE 2 INHALATIONS TWICE A DAY 03/20/22   Tanda Rockers, MD  ENTRESTO 49-51 MG TAKE 1 TABLET BY MOUTH 2 TIMES DAILY. 12/02/21   Cantwell, Celeste C, PA-C  furosemide  (LASIX) 40 MG tablet TAKE 1 TABLET (40 MG TOTAL) BY MOUTH DAILY AS NEEDED FOR FLUID OR EDEMA. 05/25/22 05/25/23  [name], MD  gabapentin (NEURONTIN) 100 MG capsule TAKE 1 CAPSULE BY MOUTH 3 TO 4 TIMES A DAY 03/29/22   Tanda Rockers, MD  hydrALAZINE (APRESOLINE) 50 MG tablet Take 1 tablet (50 mg total) by mouth 3 (three) times daily. 11/07/21   Cantwell, Celeste C, PA-C  isosorbide mononitrate (IMDUR) 120 MG 24 hr tablet Take 120 mg by mouth daily. 07/26/21   [provider]  L-Methylfolate-Algae-B12-B6 Glade [place]) 3-90.314-2-35 MG CAPS Take 1 tablet by mouth 2 (two) times daily.     [provider]  Magnesium 400 MG TABS Take 400 mg by mouth daily at 12 noon.     [provider]  metoprolol tartrate (LOPRESSOR) 25 MG tablet Take 1 tablet (25 mg total) by mouth 2 (two) times daily. 11/07/21   Cantwell, Celeste C, PA-C  montelukast (SINGULAIR) 10 MG tablet TAKE 1 TABLET BY MOUTH AT BEDTIME 01/27/22   Tanda Rockers, MD  omeprazole (PRILOSEC) 20 MG capsule Take 20 mg by mouth 2 (two) times daily before a meal.    [provider]  Polyethyl Glycol-Propyl Glycol (SYSTANE OP) Place 1 drop into both eyes 3 (three) times daily as needed (dry eyes).    [provider]  sotalol (  BETAPACE) 80 MG tablet TAKE 1/2 TABLET (40 MG TOTAL) BY MOUTH 2 TIMES DAILY. 05/10/22   [name], MD  thiamine (VITAMIN B-1) 100 MG tablet Take 100 mg by mouth daily.    [provider]      Allergies    Amlodipine and Penicillins    Review of Systems   Review of Systems  Constitutional:  Positive for fatigue. Negative for chills and fever.  HENT:  Negative for congestion.   Eyes:  Negative for visual disturbance.  Respiratory:  Positive for cough, sputum production, chest tightness, shortness of breath and wheezing. Negative for stridor.   Cardiovascular:  Positive for leg swelling. Negative for chest pain and palpitations.  Gastrointestinal:  Negative for abdominal pain,  constipation, diarrhea, nausea and vomiting.  Genitourinary:  Negative for dysuria, flank pain and frequency.  Musculoskeletal:  Negative for back pain and neck pain.  Skin:  Negative for rash and wound.  Neurological:  Negative for numbness and headaches.  Psychiatric/Behavioral:  Negative for agitation and confusion.   All other systems reviewed and are negative.   Physical Exam Updated Vital Signs BP 110/62   Pulse (!) 58   Temp 98.1 F (36.7 C) (Oral)   Resp 16   Ht '5\' 11"'$  (1.803 m)   Wt 115.2 kg   SpO2 100%   BMI 35.43 kg/m  Physical Exam Vitals and nursing note reviewed.  Constitutional:      General: He is not in acute distress.    Appearance: He is well-developed. He is not ill-appearing, toxic-appearing or diaphoretic.  HENT:     Head: Normocephalic and atraumatic.     Mouth/Throat:     Mouth: Mucous membranes are moist.  Eyes:     Conjunctiva/sclera: Conjunctivae normal.     Pupils: Pupils are equal, round, and reactive to light.  Cardiovascular:     Rate and Rhythm: Regular rhythm. Bradycardia present.     Heart sounds: No murmur heard. Pulmonary:     Effort: Pulmonary effort is normal. Tachypnea present. No respiratory distress.     Breath sounds: Wheezing, rhonchi and rales present.  Chest:     Chest wall: No tenderness.  Abdominal:     Palpations: Abdomen is soft.     Tenderness: There is no abdominal tenderness.  Musculoskeletal:        General: No swelling.     Cervical back: Neck supple.     Right lower leg: Edema present.     Left lower leg: Edema present.  Skin:    General: Skin is warm and dry.     Capillary Refill: Capillary refill takes less than 2 seconds.     Findings: No erythema.  Neurological:     General: No focal deficit present.     Mental Status: He is alert.  Psychiatric:        Mood and Affect: Mood normal.     ED Results / Procedures / Treatments   Labs (all labs ordered are listed, but only abnormal results are  displayed) Labs Reviewed  CBC WITH DIFFERENTIAL/PLATELET - Abnormal; Notable for the following components:      Result Value   RBC 3.71 (*)    Hemoglobin 12.9 (*)    MCV 105.1 (*)    MCH 34.8 (*)    nRBC 0.3 (*)    Abs Immature Granulocytes 0.14 (*)    All other components within normal limits  COMPREHENSIVE METABOLIC PANEL - Abnormal; Notable for the following components:  Sodium 134 (*)    Glucose, Bld 125 (*)    BUN 63 (*)    Creatinine, Ser 3.83 (*)    Calcium 8.8 (*)    Total Protein 6.4 (*)    Albumin 3.1 (*)    GFR, Estimated 15 (*)    All other components within normal limits  BRAIN NATRIURETIC PEPTIDE - Abnormal; Notable for the following components:   B Natriuretic Peptide 621.4 (*)    All other components within normal limits  RESP PANEL BY RT-PCR (FLU A&B, COVID) ARPGX2  LACTIC ACID, PLASMA  LACTIC ACID, PLASMA  TROPONIN I (HIGH SENSITIVITY)  TROPONIN I (HIGH SENSITIVITY)    EKG EKG Interpretation  Date/Time:  Thursday June 15 2022 21:28:25 EST Ventricular Rate:  53 PR Interval:  198 QRS Duration: 120 QT Interval:  486 QTC Calculation: 457 R Axis:   -84 Text Interpretation: Sinus rhythm Nonspecific IVCD with LAD Inferior infarct, old Confirmed by Elnora Morrison [phone]) on 06/15/2022 9:51:37 PM  Radiology DG Chest Portable 1 View  Result Date: 06/15/2022 CLINICAL DATA:  Hypoxia and shortness of breath EXAM: PORTABLE CHEST 1 VIEW COMPARISON:  Chest x-ray 03/30/2016 FINDINGS: The heart size and mediastinal contours are within normal limits. Both lungs are clear. The visualized skeletal structures are unremarkable. IMPRESSION: No active disease. Electronically Signed   By: Ronney Asters M.D.   On: 06/15/2022 22:27    Procedures Procedures    CRITICAL CARE Performed by: Gwenyth Allegra Jaymir Struble Total critical care time: 35 minutes Critical care time was exclusive of separately billable procedures and treating other patients. Critical care was necessary  to treat or prevent imminent or life-threatening deterioration. Critical care was time spent personally by me on the following activities: development of treatment plan with patient and/or surrogate as well as nursing, discussions with consultants, evaluation of patient's response to treatment, examination of patient, obtaining history from patient or surrogate, ordering and performing treatments and interventions, ordering and review of laboratory studies, ordering and review of radiographic studies, pulse oximetry and re-evaluation of patient's condition.   Medications Ordered in ED Medications  ipratropium-albuterol (DUONEB) 0.5-2.5 (3) MG/3ML nebulizer solution 3 mL (3 mLs Nebulization Given 06/15/22 2232)  methylPREDNISolone sodium succinate (SOLU-MEDROL) 125 mg/2 mL injection 125 mg (125 mg Intravenous Given 06/15/22 2232)    ED Course/ Medical Decision Making/ A&P                           Medical Decision Making Amount and/or Complexity of Data Reviewed Labs: ordered. Radiology: ordered.  Risk Prescription drug management. Decision regarding hospitalization.    FORDYCE LEPAK is a 81 y.o. male with a past medical history significant for atrial fibrillation on Eliquis therapy, COPD, cough variant asthma, hypertension, previous GI bleed, GERD, prostate cancer, and recent diagnosis of CHF managed by [place] cards presents with hypoxia and shortness of breath.  According to patient, he has been having waxing waning shortness of breath for the last week or so and has had some cough intermittently productive.  Denies fever chills or sick contacts.  He reports he has been taking his Lasix but today his edema in his legs seems to worsen.  He was found to have oxygen saturations in the 70s outside of a restaurant tonight when he was very short of breath.  He reports no chest pain or palpitations and denies any nausea, vomiting, constipation, diarrhea, or urinary symptoms.  He reports no  hemoptysis and no history  of DVT or PE.  He was on a nonrebreather during transport and was de-escalated to 10 L on arrival.  When I first evaluated patient his oxygen saturations were in the low 90s on 2 L however when I sat him up to examine him but quickly dropped to the low 80s.  He was increased to 4 L nasal cannula to maintain oxygen saturations in the low 90s.  On my exam, patient does have rales, rhonchi, and some wheezing.  He denies any chest tenderness or abdominal tenderness.  Legs are edematous slightly worse than baseline he reports.  No focal neurologic deficits initially.  Back nontender.  Patient otherwise well-appearing.  EKG does not show STEMI and shows a sinus rhythm.  Given the patient's new oxygen requirement I am somewhat concerned that he will need admission to the hospital.  We will give him a DuoNeb and some steroids for the wheezing, will get labs including a BNP and a chest x-ray to look for a fluid overload etiology of his hypoxia and will also rule out pneumonia.  Will get COVID and flu swabs.  Anticipate admission after workup is completed.    11:12 PM Workup continues to return.  No leukocytosis and mild anemia present.  Chest x-ray does not show pneumonia or fluid overload however clinically he did have some crackles and rhonchi with his wheezing.  Will wait for results of BNP, troponin, and metabolic panel and then will call for admission for suspected reactive airway disease exacerbation leading to hypoxia in the setting of URI and mild fluid overload.  11:48 PM Workup continues to return.  BNP is elevated at 621.4 however there is no prior to compare.  Initial troponin is normal.  Lactic acid normal and white blood cell count normal.  Low suspicion for pneumonia given reassuring x-ray.  What is elevated also is his creatinine is up to 3.83 up from 1.18 several months ago.  I suspect this is in the setting of his increasing diuresis to try to help with the edema  leading to AKI.  Patient will need admission for hypoxic respiratory failure in the setting of likely reactive airway disease exacerbation, AKI, and fluid overload.  Patient is currently on 4 L to maintain oxygen saturations in the low 90s.        Final Clinical Impression(s) / ED Diagnoses Final diagnoses:  Hypoxia  Shortness of breath  Wheezing  Acute cough     Clinical Impression: 1. Hypoxia   2. Shortness of breath   3. Wheezing   4. Acute cough     Disposition: Admit  This note was prepared with assistance of Dragon voice recognition software. Occasional wrong-word or sound-a-like substitutions may have occurred due to the inherent limitations of voice recognition software.     Loreto Loescher, Gwenyth Allegra, MD 06/15/22 [phone]

## 2022-06-16 DIAGNOSIS — Z825 Family history of asthma and other chronic lower respiratory diseases: Secondary | ICD-10-CM | POA: Diagnosis not present

## 2022-06-16 DIAGNOSIS — Z79899 Other long term (current) drug therapy: Secondary | ICD-10-CM | POA: Diagnosis not present

## 2022-06-16 DIAGNOSIS — I480 Paroxysmal atrial fibrillation: Secondary | ICD-10-CM

## 2022-06-16 DIAGNOSIS — J441 Chronic obstructive pulmonary disease with (acute) exacerbation: Secondary | ICD-10-CM | POA: Diagnosis present

## 2022-06-16 DIAGNOSIS — Z1152 Encounter for screening for COVID-19: Secondary | ICD-10-CM | POA: Diagnosis not present

## 2022-06-16 DIAGNOSIS — E785 Hyperlipidemia, unspecified: Secondary | ICD-10-CM | POA: Diagnosis present

## 2022-06-16 DIAGNOSIS — G4733 Obstructive sleep apnea (adult) (pediatric): Secondary | ICD-10-CM | POA: Diagnosis present

## 2022-06-16 DIAGNOSIS — K760 Fatty (change of) liver, not elsewhere classified: Secondary | ICD-10-CM | POA: Diagnosis present

## 2022-06-16 DIAGNOSIS — E669 Obesity, unspecified: Secondary | ICD-10-CM | POA: Diagnosis present

## 2022-06-16 DIAGNOSIS — J9601 Acute respiratory failure with hypoxia: Secondary | ICD-10-CM | POA: Diagnosis present

## 2022-06-16 DIAGNOSIS — Z6835 Body mass index (BMI) 35.0-35.9, adult: Secondary | ICD-10-CM | POA: Diagnosis not present

## 2022-06-16 DIAGNOSIS — E875 Hyperkalemia: Secondary | ICD-10-CM | POA: Diagnosis not present

## 2022-06-16 DIAGNOSIS — N179 Acute kidney failure, unspecified: Secondary | ICD-10-CM | POA: Diagnosis not present

## 2022-06-16 DIAGNOSIS — D649 Anemia, unspecified: Secondary | ICD-10-CM | POA: Diagnosis present

## 2022-06-16 DIAGNOSIS — I495 Sick sinus syndrome: Secondary | ICD-10-CM | POA: Diagnosis present

## 2022-06-16 DIAGNOSIS — Z9842 Cataract extraction status, left eye: Secondary | ICD-10-CM | POA: Diagnosis not present

## 2022-06-16 DIAGNOSIS — F1010 Alcohol abuse, uncomplicated: Secondary | ICD-10-CM | POA: Diagnosis present

## 2022-06-16 DIAGNOSIS — N170 Acute kidney failure with tubular necrosis: Secondary | ICD-10-CM | POA: Diagnosis present

## 2022-06-16 DIAGNOSIS — I110 Hypertensive heart disease with heart failure: Secondary | ICD-10-CM | POA: Diagnosis present

## 2022-06-16 DIAGNOSIS — Z8546 Personal history of malignant neoplasm of prostate: Secondary | ICD-10-CM | POA: Diagnosis not present

## 2022-06-16 DIAGNOSIS — Z87891 Personal history of nicotine dependence: Secondary | ICD-10-CM | POA: Diagnosis not present

## 2022-06-16 DIAGNOSIS — R0602 Shortness of breath: Secondary | ICD-10-CM | POA: Diagnosis present

## 2022-06-16 DIAGNOSIS — I5033 Acute on chronic diastolic (congestive) heart failure: Secondary | ICD-10-CM | POA: Diagnosis present

## 2022-06-16 DIAGNOSIS — K219 Gastro-esophageal reflux disease without esophagitis: Secondary | ICD-10-CM | POA: Diagnosis present

## 2022-06-16 DIAGNOSIS — R7303 Prediabetes: Secondary | ICD-10-CM | POA: Diagnosis present

## 2022-06-16 DIAGNOSIS — Z7901 Long term (current) use of anticoagulants: Secondary | ICD-10-CM | POA: Diagnosis not present

## 2022-06-16 HISTORY — DX: Chronic obstructive pulmonary disease with (acute) exacerbation: J44.1

## 2022-06-16 HISTORY — DX: Acute respiratory failure with hypoxia: J96.01

## 2022-06-16 LAB — I-STAT VENOUS BLOOD GAS, ED
Acid-base deficit: 3 mmol/L — ABNORMAL HIGH (ref 0.0–2.0)
Bicarbonate: 24.7 mmol/L (ref 20.0–28.0)
Calcium, Ion: 1.18 mmol/L (ref 1.15–1.40)
HCT: 39 % (ref 39.0–52.0)
Hemoglobin: 13.3 g/dL (ref 13.0–17.0)
O2 Saturation: 96 %
Potassium: 6.2 mmol/L — ABNORMAL HIGH (ref 3.5–5.1)
Sodium: 130 mmol/L — ABNORMAL LOW (ref 135–145)
TCO2: 26 mmol/L (ref 22–32)
pCO2, Ven: 52.8 mmHg (ref 44–60)
pH, Ven: 7.278 (ref 7.25–7.43)
pO2, Ven: 91 mmHg — ABNORMAL HIGH (ref 32–45)

## 2022-06-16 LAB — CBC
HCT: 39.9 % (ref 39.0–52.0)
Hemoglobin: 13.7 g/dL (ref 13.0–17.0)
MCH: 35.7 pg — ABNORMAL HIGH (ref 26.0–34.0)
MCHC: 34.3 g/dL (ref 30.0–36.0)
MCV: 103.9 fL — ABNORMAL HIGH (ref 80.0–100.0)
Platelets: 240 10*3/uL (ref 150–400)
RBC: 3.84 MIL/uL — ABNORMAL LOW (ref 4.22–5.81)
RDW: 12.7 % (ref 11.5–15.5)
WBC: 7.6 10*3/uL (ref 4.0–10.5)
nRBC: 0 % (ref 0.0–0.2)

## 2022-06-16 LAB — ETHANOL: Alcohol, Ethyl (B): <10 mg/dL (ref <10)

## 2022-06-16 LAB — BASIC METABOLIC PANEL
Anion gap: 9 (ref 5–15)
BUN: 67 mg/dL — ABNORMAL HIGH (ref 8–23)
CO2: 23 mmol/L (ref 22–32)
Calcium: 8.6 mg/dL — ABNORMAL LOW (ref 8.9–10.3)
Chloride: 99 mmol/L (ref 98–111)
Creatinine, Ser: 3.5 mg/dL — ABNORMAL HIGH (ref 0.61–1.24)
GFR, Estimated: 17 mL/min — ABNORMAL LOW (ref >60)
Glucose, Bld: 246 mg/dL — ABNORMAL HIGH (ref 70–99)
Potassium: 6.2 mmol/L — ABNORMAL HIGH (ref 3.5–5.1)
Sodium: 131 mmol/L — ABNORMAL LOW (ref 135–145)

## 2022-06-16 LAB — URINALYSIS, COMPLETE (UACMP) WITH MICROSCOPIC
Bacteria, UA: NONE SEEN
Bilirubin Urine: NEGATIVE
Glucose, UA: NEGATIVE mg/dL
Hgb urine dipstick: NEGATIVE
Ketones, ur: NEGATIVE mg/dL
Leukocytes,Ua: NEGATIVE
Nitrite: NEGATIVE
Protein, ur: NEGATIVE mg/dL
Specific Gravity, Urine: 1.01 (ref 1.005–1.030)
pH: 5 (ref 5.0–8.0)

## 2022-06-16 LAB — LACTIC ACID, PLASMA: Lactic Acid, Venous: 1.1 mmol/L (ref 0.5–1.9)

## 2022-06-16 LAB — MAGNESIUM: Magnesium: 1.9 mg/dL (ref 1.7–2.4)

## 2022-06-16 LAB — RESP PANEL BY RT-PCR (FLU A&B, COVID) ARPGX2
Influenza A by PCR: NEGATIVE
Influenza B by PCR: NEGATIVE
SARS Coronavirus 2 by RT PCR: NEGATIVE

## 2022-06-16 LAB — PHOSPHORUS: Phosphorus: 6 mg/dL — ABNORMAL HIGH (ref 2.5–4.6)

## 2022-06-16 LAB — CK: Total CK: 43 U/L — ABNORMAL LOW (ref 49–397)

## 2022-06-16 LAB — PREALBUMIN: Prealbumin: 23 mg/dL (ref 18–38)

## 2022-06-16 LAB — CREATININE, URINE, RANDOM: Creatinine, Urine: 141 mg/dL

## 2022-06-16 LAB — SODIUM, URINE, RANDOM: Sodium, Ur: 16 mmol/L

## 2022-06-16 LAB — TSH: TSH: 1.22 u[IU]/mL (ref 0.350–4.500)

## 2022-06-16 LAB — POTASSIUM: Potassium: 5.2 mmol/L — ABNORMAL HIGH (ref 3.5–5.1)

## 2022-06-16 LAB — TROPONIN I (HIGH SENSITIVITY): Troponin I (High Sensitivity): 17 ng/L (ref <18)

## 2022-06-16 MED ORDER — THIAMINE MONONITRATE 100 MG PO TABS
100.0000 mg | ORAL_TABLET | Freq: Every day | ORAL | Status: DC
  Administered 2022-06-16 – 2022-06-20 (×5): 100 mg via ORAL
  Filled 2022-06-16 (×5): qty 1

## 2022-06-16 MED ORDER — THIAMINE MONONITRATE 100 MG PO TABS: 100.0000 mg | ORAL_TABLET | Freq: Every day | ORAL | Status: DC

## 2022-06-16 MED ORDER — MOMETASONE FURO-FORMOTEROL FUM 100-5 MCG/ACT IN AERO
2.0000 | INHALATION_SPRAY | Freq: Two times a day (BID) | RESPIRATORY_TRACT | Status: DC
  Administered 2022-06-17 – 2022-06-20 (×6): 2 via RESPIRATORY_TRACT
  Filled 2022-06-16: qty 8.8

## 2022-06-16 MED ORDER — SODIUM ZIRCONIUM CYCLOSILICATE 10 G PO PACK
10.0000 g | PACK | Freq: Once | ORAL | Status: AC
  Administered 2022-06-16: 10 g via ORAL
  Filled 2022-06-16: qty 1

## 2022-06-16 MED ORDER — ISOSORBIDE MONONITRATE ER 60 MG PO TB24
120.0000 mg | ORAL_TABLET | Freq: Every day | ORAL | Status: DC
  Administered 2022-06-16 – 2022-06-20 (×5): 120 mg via ORAL
  Filled 2022-06-16 (×5): qty 2

## 2022-06-16 MED ORDER — IPRATROPIUM-ALBUTEROL 0.5-2.5 (3) MG/3ML IN SOLN
3.0000 mL | Freq: Four times a day (QID) | RESPIRATORY_TRACT | Status: DC
  Administered 2022-06-16 – 2022-06-17 (×3): 3 mL via RESPIRATORY_TRACT
  Filled 2022-06-16 (×5): qty 3

## 2022-06-16 MED ORDER — LORAZEPAM 2 MG/ML IJ SOLN: 1.0000 mg | INTRAMUSCULAR | Status: AC | PRN

## 2022-06-16 MED ORDER — METHYLPREDNISOLONE SODIUM SUCC 40 MG IJ SOLR
80.0000 mg | INTRAMUSCULAR | Status: AC
  Administered 2022-06-16: 80 mg via INTRAVENOUS
  Filled 2022-06-16: qty 2

## 2022-06-16 MED ORDER — ALLOPURINOL 100 MG PO TABS
100.0000 mg | ORAL_TABLET | Freq: Every day | ORAL | Status: DC
  Administered 2022-06-16 – 2022-06-20 (×5): 100 mg via ORAL
  Filled 2022-06-16 (×5): qty 1

## 2022-06-16 MED ORDER — LORAZEPAM 1 MG PO TABS: 1.0000 mg | ORAL_TABLET | ORAL | Status: AC | PRN

## 2022-06-16 MED ORDER — ACETAMINOPHEN 650 MG RE SUPP: 650.0000 mg | Freq: Four times a day (QID) | RECTAL | Status: DC | PRN

## 2022-06-16 MED ORDER — FOLIC ACID 1 MG PO TABS
1.0000 mg | ORAL_TABLET | Freq: Every day | ORAL | Status: DC
  Administered 2022-06-16 – 2022-06-20 (×5): 1 mg via ORAL
  Filled 2022-06-16 (×5): qty 1

## 2022-06-16 MED ORDER — DOXYCYCLINE HYCLATE 100 MG PO TABS
100.0000 mg | ORAL_TABLET | Freq: Two times a day (BID) | ORAL | Status: DC
  Administered 2022-06-16 – 2022-06-20 (×8): 100 mg via ORAL
  Filled 2022-06-16 (×8): qty 1

## 2022-06-16 MED ORDER — ACETAMINOPHEN 325 MG PO TABS: 650.0000 mg | ORAL_TABLET | Freq: Four times a day (QID) | ORAL | Status: DC | PRN

## 2022-06-16 MED ORDER — ENSURE ENLIVE PO LIQD
237.0000 mL | Freq: Two times a day (BID) | ORAL | Status: DC
  Administered 2022-06-17 – 2022-06-18 (×3): 237 mL via ORAL

## 2022-06-16 MED ORDER — PREDNISONE 20 MG PO TABS: 40.0000 mg | ORAL_TABLET | Freq: Every day | ORAL | Status: DC

## 2022-06-16 MED ORDER — THIAMINE HCL 100 MG/ML IJ SOLN: 100.0000 mg | Freq: Every day | INTRAMUSCULAR | Status: DC

## 2022-06-16 MED ORDER — MONTELUKAST SODIUM 10 MG PO TABS
10.0000 mg | ORAL_TABLET | Freq: Every day | ORAL | Status: DC
  Administered 2022-06-16 – 2022-06-19 (×4): 10 mg via ORAL
  Filled 2022-06-16 (×4): qty 1

## 2022-06-16 MED ORDER — SODIUM CHLORIDE 0.9 % IV SOLN: 250.0000 mL | INTRAVENOUS | Status: DC | PRN

## 2022-06-16 MED ORDER — PREDNISONE 20 MG PO TABS
40.0000 mg | ORAL_TABLET | Freq: Every day | ORAL | Status: AC
  Administered 2022-06-17 – 2022-06-20 (×4): 40 mg via ORAL
  Filled 2022-06-16 (×4): qty 2

## 2022-06-16 MED ORDER — ALBUTEROL SULFATE (2.5 MG/3ML) 0.083% IN NEBU: 2.5000 mg | INHALATION_SOLUTION | RESPIRATORY_TRACT | Status: DC | PRN

## 2022-06-16 MED ORDER — SODIUM CHLORIDE 0.9 % IV BOLUS
500.0000 mL | Freq: Once | INTRAVENOUS | Status: AC
  Administered 2022-06-16: 500 mL via INTRAVENOUS

## 2022-06-16 MED ORDER — ATORVASTATIN CALCIUM 40 MG PO TABS
40.0000 mg | ORAL_TABLET | Freq: Every day | ORAL | Status: DC
  Administered 2022-06-16 – 2022-06-20 (×5): 40 mg via ORAL
  Filled 2022-06-16 (×5): qty 1

## 2022-06-16 MED ORDER — APIXABAN 5 MG PO TABS
5.0000 mg | ORAL_TABLET | Freq: Two times a day (BID) | ORAL | Status: DC
  Administered 2022-06-16 – 2022-06-20 (×8): 5 mg via ORAL
  Filled 2022-06-16 (×8): qty 1

## 2022-06-16 MED ORDER — ADULT MULTIVITAMIN W/MINERALS CH
1.0000 | ORAL_TABLET | Freq: Every day | ORAL | Status: DC
  Administered 2022-06-16 – 2022-06-20 (×5): 1 via ORAL
  Filled 2022-06-16 (×5): qty 1

## 2022-06-16 MED ORDER — SOTALOL HCL 80 MG PO TABS
40.0000 mg | ORAL_TABLET | Freq: Two times a day (BID) | ORAL | Status: DC
  Filled 2022-06-16: qty 0.5

## 2022-06-16 MED ORDER — SODIUM CHLORIDE 0.9% FLUSH: 3.0000 mL | INTRAVENOUS | Status: DC | PRN

## 2022-06-16 MED ORDER — DOXYCYCLINE HYCLATE 100 MG PO TABS: 100.0000 mg | ORAL_TABLET | Freq: Two times a day (BID) | ORAL | Status: DC

## 2022-06-16 MED ORDER — SODIUM CHLORIDE 0.9 % IV SOLN: INTRAVENOUS | Status: DC

## 2022-06-16 MED ORDER — PANTOPRAZOLE SODIUM 40 MG PO TBEC
40.0000 mg | DELAYED_RELEASE_TABLET | Freq: Every day | ORAL | Status: DC
  Administered 2022-06-17 – 2022-06-20 (×4): 40 mg via ORAL
  Filled 2022-06-16 (×4): qty 1

## 2022-06-16 MED ORDER — SODIUM CHLORIDE 0.9% FLUSH
3.0000 mL | Freq: Two times a day (BID) | INTRAVENOUS | Status: DC
  Administered 2022-06-16 – 2022-06-20 (×5): 3 mL via INTRAVENOUS

## 2022-06-16 NOTE — Assessment & Plan Note (Signed)
Monitor for any signs of withdrawal Cont thiamin CIWa protocol

## 2022-06-16 NOTE — Assessment & Plan Note (Signed)
-   Pt diagnosed with CHF based on presence of the following:  rales on exam,  and   bilateral leg edema   admit on telemetry,  cycle cardiac enzymes, Troponin 16    obtain serial ECG  to evaluate for ischemia as a cause of heart failure  monitor daily weight:  Filed Weights   06/15/22 2110  Weight: 115.2 kg   Last BNP BNP (last 3 results) Recent Labs    06/15/22 2217  BNP 621.4*    ProBNP (last 3 results) Recent Labs    11/29/21 1342 12/06/21 1357  [place]      cardiology emailed, will be difficult to manage in the setting of AKI

## 2022-06-16 NOTE — Assessment & Plan Note (Signed)
-  -   Will initiate: Steroid taper  -  Antibiotics   Doxycycline, - Albuterol  PRN, - scheduled duoneb,  -  Breo or Dulera at discharge   -  Mucinex.  Titrate O2 to saturation >90%. Follow patients respiratory status.  Order nfluenza PCR   VBG    Currently mentating well no evidence of symptomatic hypercarbia

## 2022-06-16 NOTE — Consult Note (Incomplete)
CARDIOLOGY CONSULT NOTE  Patient ID: Darren [name] MRN: [id number] DOB/AGE: Nov 07, 1940 81 y.o.  Admit date: 06/15/2022 Referring Physician  Murray Hodgkins, MD Primary Physician:  [name], MD Reason for Consultation  CHF  Patient ID: Darren [name], male    DOB: October 01, 1940, 81 y.o.   MRN: [id number]  Chief Complaint  Patient presents with   Shortness of Breath   HPI:    Darren [name]  is a 82 y.o. moderate obesity, paroxysmal atrial fibrillation and probably sinus node dysfunction with sinus bradycardia even on minimal dose of beta-blocker, OSA on CPAP, hyperlipidemia, reactive airway disease with bronchial asthma and history of excessive alcohol intake. Patient had developed severe LV systolic dysfunction, he was then started on sotalol on 12/18/2018 and echocardiogram in August 2020 following cardioversion revealed normalization of LVEF.   He was seen for an acute compensated diastolic heart failure visit on 06/07/2022 where he was started on daily dose of furosemide instead of as needed use, however he presented to the emergency room yesterday with hypoxemic respiratory failure and acute decompensated heart failure.  I was consulted to manage his heart failure.  Patient was seen about a week ago in our office for worsening dyspnea and leg edema and was told to increase his furosemide from 40 mg once a day to twice daily for about a week.  He lost about 7 pounds in weight.  But admits to eating poorly, went to [place] 3 days ago and last night went to a [place] and started noticing acute onset shortness of breath and wheezing and had near syncopal spell, EMS was called and brought to the emergency room.  It [birth year] and has family coming from various places.  He is feeling slightly better but still is short of breath and is wheezing.  Also has cough, no fever or chills.  Past Medical History:  Diagnosis Date   Arthritis    "maybe a little"    Asthma    "mild"   Atrial fibrillation ([place])    Atypical mole 08/16/2011   left upper back tx exc atypical prol.   Atypical mole 12/18/2012   solar lentigo atypical tx w/s   bilateral leg edema 09/11/2018   Cancer ([place])    prostate cancer   Dyspnea    Dyspnea on exertion 09/11/2018   Dysrhythmia    Afib with rapid ventricular   Essential hypertension 07/25/2018   Fatty liver    GERD (gastroesophageal reflux disease)    GI bleed 2018   Gout    History of blood transfusion 2018   Obstructive sleep apnea 09/11/2018   OSA (obstructive sleep apnea)    Paroxysmal atrial fibrillation ([place]) 09/11/2018   Pre-diabetes    Past Surgical History:  Procedure Laterality Date   CARDIOVERSION N/A 10/08/2018   Procedure: CARDIOVERSION;  Surgeon: [name], MD;  Location: [facility] ENDOSCOPY;  Service: Cardiovascular;  Laterality: N/A;   CARDIOVERSION N/A 01/17/2019   Procedure: CARDIOVERSION;  Surgeon: [name], MD;  Location: [place];  Service: Cardiovascular;  Laterality: N/A;   COLONOSCOPY Left 03/10/2017   Procedure: COLONOSCOPY;  Surgeon: Arta Silence, MD;  Location: [facility] ENDOSCOPY;  Service: Endoscopy;  Laterality: Left;   ESOPHAGOGASTRODUODENOSCOPY (EGD) WITH PROPOFOL Left 03/09/2017   Procedure: ESOPHAGOGASTRODUODENOSCOPY (EGD) WITH PROPOFOL;  Surgeon: Ronnette Juniper, MD;  Location: [place];  Service: Gastroenterology;  Laterality: Left;   EYE SURGERY Bilateral    cataract    FINGER SURGERY Right  5th   Prostate     DaVinci   Social History   Tobacco Use   Smoking status: Former    Packs/day: 1.00    Years: 25.00    Total pack years: 25.00    Types: Cigarettes    Start date: 9    Quit date: 07/10/1988    Years since quitting: 33.9   Smokeless tobacco: Never  Substance Use Topics   Alcohol use: Yes    Alcohol/week: 25.0 standard drinks of alcohol    Types: 25 Shots of liquor per week    Comment: Daily     Family History  Problem Relation Age of Onset   Emphysema  Father        smoked    Marital Status: Married  ROS  Review of Systems  Cardiovascular:  Positive for dyspnea on exertion, leg swelling, near-syncope and orthopnea. Negative for chest pain.  Respiratory:  Positive for cough and wheezing.    Objective      06/16/2022   10:00 AM 06/16/2022    9:00 AM 06/16/2022    8:00 AM  Vitals with BMI  Systolic 462 703 500  Diastolic 72 70 57  Pulse 57 56 56    Blood pressure 130/72, pulse (!) 57, temperature 98.1 F (36.7 C), temperature source Oral, resp. rate 19, height '5\' 11"'$  (1.803 m), weight 115.2 kg, SpO2 94 %.    Physical Exam Constitutional:      Appearance: He is morbidly obese.  Neck:     Vascular: No carotid bruit.     Comments: Short neck Cardiovascular:     Rate and Rhythm: Normal rate and regular rhythm.     Pulses: Intact distal pulses.     Heart sounds: Heart sounds are distant. No murmur heard.    No gallop.  Pulmonary:     Effort: Pulmonary effort is normal.     Breath sounds: Examination of the right-lower field reveals rales. Examination of the left-lower field reveals rales. Wheezing (diffuse) and rales present.  Abdominal:     General: Bowel sounds are normal.     Palpations: Abdomen is soft.  Musculoskeletal:     Right lower leg: Edema (2+ pitting) present.     Left lower leg: Edema (2+ pitting) present.    Laboratory examination:   Recent Labs    11/29/21 1342 12/06/21 1401 06/15/22 2217 06/16/22 0348  NA 142 140 134* 130*  K 4.3 4.3 4.9 6.2*  CL 104 100 100  --   CO2 '26 25 23  '$ --   GLUCOSE 121* 159* 125*  --   BUN 13 17 63*  --   CREATININE 0.97 1.18 3.83*  --   CALCIUM 9.5 9.2 8.8*  --   GFRNONAA  --   --  15*  --    estimated creatinine clearance is 20.2 mL/min (A) (by C-G formula based on SCr of 3.83 mg/dL (H)).     Latest Ref Rng & Units 06/16/2022    3:48 AM 06/15/2022   10:17 PM 12/06/2021    2:01 PM  CMP  Glucose 70 - 99 mg/dL  125  159   BUN 8 - 23 mg/dL  63  17   Creatinine 0.61  - 1.24 mg/dL  3.83  1.18   Sodium 135 - 145 mmol/L 130  134  140   Potassium 3.5 - 5.1 mmol/L 6.2  4.9  4.3   Chloride 98 - 111 mmol/L  100  100   CO2  22 - 32 mmol/L  23  25   Calcium 8.9 - 10.3 mg/dL  8.8  9.2   Total Protein 6.5 - 8.1 g/dL  6.4    Total Bilirubin 0.3 - 1.2 mg/dL  0.3    Alkaline Phos 38 - 126 U/L  92    AST 15 - 41 U/L  18    ALT 0 - 44 U/L  15        Latest Ref Rng & Units 06/16/2022    3:48 AM 06/15/2022   10:17 PM 01/17/2019    7:11 AM  CBC  WBC 4.0 - 10.5 K/uL  7.8    Hemoglobin 13.0 - 17.0 g/dL 13.3  12.9  14.3   Hematocrit 39.0 - 52.0 % 39.0  39.0  42.0   Platelets 150 - 400 K/uL  222     Lab Results  Component Value Date   CHOL 182 05/03/2021   HDL 96 05/03/2021   LDLCALC 65 05/03/2021   TRIG 125 05/03/2021     No results found for: "HGBA1C"  TSH Recent Labs    06/16/22 0025  TSH 1.220   BNP (last 3 results) Recent Labs    06/15/22 2217  BNP 621.4*   Cardiac Panel (last 3 results) Recent Labs    06/15/22 2217 06/16/22 0025  CKTOTAL  --  43*  TROPONINIHS 16 17     Medications and allergies   Allergies  Allergen Reactions   Amlodipine     Leg edema   Penicillins Hives    Has patient had a PCN reaction causing immediate rash, facial/tongue/throat swelling, SOB or lightheadedness with hypotension: No Has patient had a PCN reaction causing severe rash involving mucus membranes or skin necrosis: No Has patient had a PCN reaction that required hospitalization: No Has patient had a PCN reaction occurring within the last 10 years: No--childhood reaction If all of the above answers are "NO", then may proceed with Cephalosporin use.     Current Meds  Medication Sig   allopurinol (ZYLOPRIM) 100 MG tablet Take 100 mg by mouth daily.    apixaban (ELIQUIS) 5 MG TABS tablet Take 1 tablet (5 mg total) by mouth 2 (two) times daily.   atorvastatin (LIPITOR) 40 MG tablet Take 40 mg by mouth daily.    Cholecalciferol (VITAMIN D-3 PO) Take  2,000 Units by mouth daily.    colesevelam (WELCHOL) 625 MG tablet Take 1,875 mg by mouth 2 (two) times daily with a meal.    DULERA 100-5 MCG/ACT AERO USE 2 INHALATIONS TWICE A DAY (Patient taking differently: Inhale 2 puffs into the lungs in the morning and at bedtime.)   ENTRESTO 49-51 MG TAKE 1 TABLET BY MOUTH 2 TIMES DAILY. (Patient taking differently: Take 1/2 tablet by mouth twice a day)   furosemide (LASIX) 40 MG tablet TAKE 1 TABLET (40 MG TOTAL) BY MOUTH DAILY AS NEEDED FOR FLUID OR EDEMA. (Patient taking differently: Take 40 mg by mouth daily.)   gabapentin (NEURONTIN) 100 MG capsule TAKE 1 CAPSULE BY MOUTH 3 TO 4 TIMES A DAY (Patient taking differently: Take 100 mg by mouth 3 (three) times daily.)   hydrALAZINE (APRESOLINE) 50 MG tablet Take 1 tablet (50 mg total) by mouth 3 (three) times daily.   isosorbide mononitrate (IMDUR) 120 MG 24 hr tablet Take 120 mg by mouth daily.   L-Methylfolate-Algae-B12-B6 (METANX) 3-90.314-2-35 MG CAPS Take 1 tablet by mouth 2 (two) times daily.    Magnesium 400 MG TABS Take 400  mg by mouth daily at 12 noon.    metoprolol tartrate (LOPRESSOR) 25 MG tablet Take 1 tablet (25 mg total) by mouth 2 (two) times daily.   montelukast (SINGULAIR) 10 MG tablet TAKE 1 TABLET BY MOUTH AT BEDTIME (Patient taking differently: Take 10 mg by mouth at bedtime.)   omeprazole (PRILOSEC) 20 MG capsule Take 20 mg by mouth 2 (two) times daily before a meal.   Polyethyl Glycol-Propyl Glycol (SYSTANE OP) Place 1 drop into both eyes 3 (three) times daily as needed (dry eyes).   sotalol (BETAPACE) 80 MG tablet TAKE 1/2 TABLET (40 MG TOTAL) BY MOUTH 2 TIMES DAILY.   thiamine (VITAMIN B-1) 100 MG tablet Take 100 mg by mouth daily.    Scheduled Meds:  folic acid  1 mg Oral Daily   multivitamin with minerals  1 tablet Oral Daily   thiamine  100 mg Oral Daily   Or   thiamine  100 mg Intravenous Daily   Continuous Infusions:  sodium chloride 75 mL/hr at 06/16/22 0617   PRN  Meds:.LORazepam **OR** LORazepam   No intake/output data recorded. No intake/output data recorded.  Net IO Since Admission: No IO data has been entered for this period [06/16/22 1025]   Radiology:   DG Chest Portable 1 View  Result Date: 06/15/2022 CLINICAL DATA:  Hypoxia and shortness of breath EXAM: PORTABLE CHEST 1 VIEW COMPARISON:  Chest x-ray 03/30/2016 FINDINGS: The heart size and mediastinal contours are within normal limits. Both lungs are clear. The visualized skeletal structures are unremarkable. IMPRESSION: No active disease. Electronically Signed   By: Ronney Asters M.D.   On: 06/15/2022 22:27    Cardiac Studies:   Lexiscan myoview stress test 09/20/2018:  1. Lexiscan stress test was performed. Exercise capacity was not assessed. Stress symptoms included dizziness. Resting blood pressure was 164/108 mmHg and peak effect blood pressure was 174/116 mmHg. The resting and stress electrocardiogram demonstrated atrial fibrillation with rapid ventricular rate, occasional PVC, and normal rest repolarization.  Stress EKG is non diagnostic for ischemia as it is a pharmacologic stress. In addition, no ischemic changes seen at heart rate of 136 bpm.  2. The overall quality of the study is good.  Left ventricular cavity is noted to be enlarged on the rest and stress studies.  Gated SPECT images reveal moderate global decrease in myocardial thickening and wall motion.  The left ventricular ejection fraction was calculated or visually estimated to be 34%.  REST and STRESS images demonstrate mildly decreased tracer uptake in the basal inferior, mid inferior and apical inferior segments of the left ventricle, with no reversibility. Findings suggestive of dilated nonishcemic cardiomyopathy.  3. High risk study due to reduced LVED. Recommend clinical correlation.   Echocardiogram 04/26/2021: Normal LV systolic function with visual EF 50-55%. Left ventricle cavity is normal in size. Mild left  ventricular hypertrophy. Normal global wall motion. Doppler evidence of grade II  diastolic dysfunction, normal LAP. Left atrial cavity is moderately dilated. Mild (Grade I) mitral regurgitation. Mild tricuspid regurgitation. Mild pulmonary hypertension. RVSP measures 49 mmHg. Moderate pulmonic regurgitation. IVC is dilated with a respiratory response of >50%. Compared to study 78/29/5621 normal diastolic filling pattern is now G2DD, mild LAE is now moderate, mild PR is now moderate, PHTN is new finding, otherwise no significant change.  EKG:  EKG 06/16/2022: Normal sinus rhythm at rate of 54 bpm, left axis deviation, left anterior fascicular block.  Incomplete right bundle branch block.  No evidence of ischemia, normal QT interval.  Assessment   1.  Acute on chronic diastolic heart failure 2.  Acute COPD exacerbation, reactive airway disease complicated by acute heart failure. 3.  Acute renal failure secondary to excess diuresis and also component of acute heart failure, ATN. 4.  Paroxysmal atrial fibrillation, presently maintaining sinus rhythm on sotalol. 5.  Primary hypertension  Recommendations:   CALLIN ASHE  is a 81 y.o. moderate obesity, paroxysmal atrial fibrillation and probably sinus node dysfunction with sinus bradycardia even on minimal dose of beta-blocker, OSA on CPAP, hyperlipidemia, reactive airway disease with bronchial asthma and history of excessive alcohol intake. Patient had developed severe LV systolic dysfunction, he was then started on sotalol on 12/18/2018 and echocardiogram in August 2020 following cardioversion revealed normalization of LVEF.   Patient's acute decompensated heart failure precipitated by poor diet and acute exacerbation of his COPD.  Agree with holding nephrotoxic medications including low-dose Entresto, discontinue sotalol for now, hold diuretics for now, will treat his COPD exacerbation as that is the major complaint as he appears to be only  mildly in heart failure.  Continue anticoagulation with Eliquis.  Needs heart failure education again, his son is present at the bedside and I have discussed this with both of them.  Strict I's and O's and daily weights, permissive hypertension to be allowed until renal function starts to improve.  With regard to paroxysmal atrial fibrillation, he is maintaining sinus rhythm. Would not be surprised if he were to go into atrial fibrillation in view of COPD exacerbation and also acute heart failure.  We will see him as needed, will have to wait for renal function to improve to re-initiate Sotalol 40 mg BID and probably stop Entresto and start ARB when stable or as OP.  At present, his presentation is more consistent with acute COPD exacerbation and complication of excess diuresis with acute renal failure then acute decompensated diastolic heart failure.  Discussed with Murray Hodgkins, MD   [name], MD, [facility] 06/16/2022, 10:25 AM Office: [phone]

## 2022-06-16 NOTE — Progress Notes (Signed)
Pt admitted to [facility], VS wnL and as per flow. Pt oriented to 6E processes. NSR on telemetry. No c/o pain. Pt familiar with the [facility] system. Son and granddaughter bedside. All questions and concerns addressed. Call bell placed within reach, will continue to monitor and maintain safety.

## 2022-06-16 NOTE — Assessment & Plan Note (Signed)
Obtain urine electrolytes Obtain renal ultrasound given severity. Hold off on Entresto and Lasix for now Leg edema could be also secondary to Neurontin Shortness of breath could be in the setting of COPD exacerbation

## 2022-06-16 NOTE — Subjective & Objective (Signed)
Worsening shortness of breath he was walking to a restaurant been gotten so short of breath he had to sit down EMS was called satting 75% room air started on nonrebreather up to 15 L.  Had recent history of CHF diagnosed Was just seen by cardiology on 29 November.  Last echogram showing grade 2 diastolic dysfunction.  He has been taking his Lasix now every day Known history of atrial fibrillation on Eliquis

## 2022-06-16 NOTE — Progress Notes (Signed)
  Progress Note   Patient: Darren [name] LGX:[id number] DOB: 18-Jan-1941 DOA: 06/15/2022     0 DOS: the patient was seen and examined on 06/16/2022   Brief hospital course: 88 male PMH including COPD, asthma, diastolic CHF, atrial fibrillation presenting with shortness of breath and hypoxia.  Admitted for acute hypoxic respiratory failure, exacerbation, AKI, acute on chronic diastolic CHF.  Assessment and Plan: Acute hypoxic respiratory failure secondary to COPD exacerbation, possibly CHF exacerbation Clinically improved.  Continue bronchodilators, steroids.   Chronic diastolic (congestive) heart failure (HCC) Chest x-ray no acute disease, given AKI will treat as below.   AKI (acute kidney injury) ([place]) with hyperkalemia Hold Entresto and Lasix. Lokelma and fluids, follow potassium.  Improved this afternoon to 5.2. Trend BMP.   COPD with acute exacerbation (HCC) Continue bronchodilators and steroids.  Doxycycline.  Paroxysmal atrial fibrillation (HCC) Continue Eliquis at 5 mg p.o. daily Hold off on beta-blocker   ETOH abuse Monitor for any signs of withdrawal. CIWA      Subjective:  Feels ok Ate a lot salt lately  Physical Exam: Vitals:   06/16/22 1300 06/16/22 1311 06/16/22 1316 06/16/22 1430  BP: (!) 149/78 (!) 149/78  (!) 145/76  Pulse: 64 60 (!) 58 69  Resp: '20  18 18  '$ Temp:   98 F (36.7 C) 97.6 F (36.4 C)  TempSrc:   Oral Oral  SpO2: 94%  96% 96%  Weight:      Height:       Physical Exam Vitals reviewed.  Constitutional:      General: He is not in acute distress.    Appearance: He is not ill-appearing or toxic-appearing.  Cardiovascular:     Rate and Rhythm: Normal rate and regular rhythm.     Heart sounds: No murmur heard. Pulmonary:     Effort: No respiratory distress.     Breath sounds: Wheezing present. No rhonchi or rales.     Comments: Mildly increased respiratory effort Musculoskeletal:     Right lower leg: Edema present.     Left lower leg:  Edema present.  Neurological:     Mental Status: He is alert.  Psychiatric:        Mood and Affect: Mood normal.        Behavior: Behavior normal.     Data Reviewed: AKI noted on admission K+ 6.2 Creatinine 3.83 > 3.5  Family Communication: son at bedside  Disposition: Status is: Inpatient Remains inpatient appropriate because: AKI, CHF, SOB, COPD, hyperkalemia  Planned Discharge Destination: Home    Time spent: 35 minutes  Author: Murray Hodgkins, MD 06/16/2022 6:32 PM  For on call review [email or web address].

## 2022-06-16 NOTE — Assessment & Plan Note (Signed)
Continue Eliquis at 5 mg p.o. daily Hold off on beta-blocker

## 2022-06-16 NOTE — ED Notes (Signed)
Patient ambulated to restroom. When patient got back into work, O2 dropped down to 85% while on room air. Patient placed back on 3L  and O2 came up to 94%. Provider notified

## 2022-06-16 NOTE — H&P (Signed)
Darren [name] JJO:[id number] DOB: 1941/05/13 DOA: 06/15/2022     PCP: [name], MD   Outpatient Specialists:   CARDS: Dr. Shellia Carwin    Patient arrived to ER on 06/15/22 at 2105 Referred by Attending Tegeler, Gwenyth Allegra, *   Patient coming from:    home Lives   With family    Chief Complaint:   Chief Complaint  Patient presents with   Shortness of Breath    HPI: Darren [name] is a 81 y.o. male with medical history significant of  COPD/asthma/diastolic CHF/A-fib on Eliquis, prostate cancer, HLD, fatty liver, HTN, GERD, history of GI bleed, gout, OSA    Presented with shortness of breath Worsening shortness of breath he was walking to a restaurant been gotten so short of breath he had to sit down EMS was called satting 75% room air started on nonrebreather up to 15 L.  Had recent history of CHF diagnosed Was just seen by cardiology on 29 November.  Last echogram showing grade 2 diastolic dysfunction.  He has been taking his Lasix now every day Known history of atrial fibrillation on Eliquis Patient drinks 3 to 4 liquor drinks /day has been doing so for few years denies alcohol withdrawal But rarely does not drink.  Had last alcoholic drink to at night prior to go to a restaurant No fevers or chills no cough sick contacts but have been having increased wheezing and shortness of breath Does not smoke but used to Interested in quitting drinking Regarding pertinent Chronic problems:     Hyperlipidemia -  on statins Lipitor (atorvastatin)  Lipid Panel     Component Value Date/Time   CHOL 182 05/03/2021 1050   TRIG 125 05/03/2021 1050   HDL 96 05/03/2021 1050   LDLCALC 65 05/03/2021 1050   LABVLDL 21 05/03/2021 1050     HTN on hydralazine, imdur  Sotalol, metoprolol   chronic CHF diastolic  - last echo  On entresto      COPD - not  followed by pulmonology   not  on baseline oxygen       OSA -on nocturnal  CPAP  A. Fib -  - CHA2DS2 vas score  3      current  on anticoagulation with  Eliquis,           -  Rate control:  Currently controlled with  Metoprolol,    Chronic anemia - baseline hg Hemoglobin & Hematocrit  Recent Labs    06/15/22 2217  HGB 12.9*    While in ER:    CXR -  NON acute   Following Medications were ordered in ER: Medications  ipratropium-albuterol (DUONEB) 0.5-2.5 (3) MG/3ML nebulizer solution 3 mL (3 mLs Nebulization Given 06/15/22 2232)  methylPREDNISolone sodium succinate (SOLU-MEDROL) 125 mg/2 mL injection 125 mg (125 mg Intravenous Given 06/15/22 2232)       ED Triage Vitals  Enc Vitals Group     BP 06/15/22 2109 110/62     Pulse Rate 06/15/22 2109 (!) 58     Resp 06/15/22 2109 16     Temp 06/15/22 2109 98.1 F (36.7 C)     Temp Source 06/15/22 2109 Oral     SpO2 06/15/22 2108 100 %     Weight 06/15/22 2110 254 lb (115.2 kg)     Height 06/15/22 2110 '5\' 11"'$  (1.803 m)     Head Circumference --      Peak Flow --      Pain  Score --      Pain Loc --      Pain Edu? --      Excl. in [place]? --   TMAX(24)@     _________________________________________ Significant initial  Findings: Abnormal Labs Reviewed  CBC WITH DIFFERENTIAL/PLATELET - Abnormal; Notable for the following components:      Result Value   RBC 3.71 (*)    Hemoglobin 12.9 (*)    MCV 105.1 (*)    MCH 34.8 (*)    nRBC 0.3 (*)    Abs Immature Granulocytes 0.14 (*)    All other components within normal limits  COMPREHENSIVE METABOLIC PANEL - Abnormal; Notable for the following components:   Sodium 134 (*)    Glucose, Bld 125 (*)    BUN 63 (*)    Creatinine, Ser 3.83 (*)    Calcium 8.8 (*)    Total Protein 6.4 (*)    Albumin 3.1 (*)    GFR, Estimated 15 (*)    All other components within normal limits  BRAIN NATRIURETIC PEPTIDE - Abnormal; Notable for the following components:   B Natriuretic Peptide 621.4 (*)    All other components within normal limits     _________________________ Troponin  17 ECG: Ordered Personally  reviewed and interpreted by me showing: HR : 53 Rhythm:Sinus rhythm Nonspecific IVCD with LAD Inferior infarct, old QTC 457   The recent clinical data is shown below. Vitals:   06/15/22 2108 06/15/22 2109 06/15/22 2110 06/16/22 0000  BP:  110/62  112/68  Pulse:  (!) 58  (!) 52  Resp:  16  13  Temp:  98.1 F (36.7 C)    TempSrc:  Oral    SpO2: 100% 100%  93%  Weight:   115.2 kg   Height:   '5\' 11"'$  (1.803 m)         WBC     Component Value Date/Time   WBC 7.8 06/15/2022 2217   LYMPHSABS 0.9 06/15/2022 2217   MONOABS 0.6 06/15/2022 2217   EOSABS 0.1 06/15/2022 2217   BASOSABS 0.1 06/15/2022 2217         Lactic Acid, Venous    Component Value Date/Time   LATICACIDVEN 1.5 06/15/2022 2217     Procalcitonin   Ordered      UA   ordered     Results for orders placed or performed during the hospital encounter of 06/15/22  Resp Panel by RT-PCR (Flu A&B, Covid) Anterior Nasal Swab     Status: None   Collection Time: 06/15/22 10:31 PM   Specimen: Anterior Nasal Swab  Result Value Ref Range Status   SARS Coronavirus 2 by RT PCR NEGATIVE NEGATIVE Final         Influenza A by PCR NEGATIVE NEGATIVE Final   Influenza B by PCR NEGATIVE NEGATIVE Final          _______________________________________________ Hospitalist was called for admission for COPD exacerbation acute respiratory failure with hypoxia   The following Work up has been ordered so far:  Orders Placed This Encounter  Procedures   Resp Panel by RT-PCR (Flu A&B, Covid) Anterior Nasal Swab   DG Chest Portable 1 View   CBC with Differential   Comprehensive metabolic panel   Lactic acid, plasma   Brain natriuretic peptide   Cardiac monitoring   Consult for Unassigned Medical Admission   EKG 12-Lead     OTHER Significant initial  Findings:  labs showing:    Recent Labs  Lab 06/15/22  2217  NA 134*  K 4.9  CO2 23  GLUCOSE 125*  BUN 63*  CREATININE 3.83*  CALCIUM 8.8*    Cr    Up from  baseline see below Lab Results  Component Value Date   CREATININE 3.83 (H) 06/15/2022   CREATININE 1.18 12/06/2021   CREATININE 0.97 11/29/2021    Recent Labs  Lab 06/15/22 2217  AST 18  ALT 15  ALKPHOS 92  BILITOT 0.3  PROT 6.4*  ALBUMIN 3.1*   Lab Results  Component Value Date   CALCIUM 8.8 (L) 06/15/2022          Plt: Lab Results  Component Value Date   PLT 222 06/15/2022    VBG ordered     Recent Labs  Lab 06/15/22 2217  WBC 7.8  NEUTROABS 6.1  HGB 12.9*  HCT 39.0  MCV 105.1*  PLT 222    HG/HCT   stable,     Component Value Date/Time   HGB 12.9 (L) 06/15/2022 2217   HCT 39.0 06/15/2022 2217   MCV 105.1 (H) 06/15/2022 2217                        Cardiac Panel (last 3 results) Recent Labs    06/16/22 0025  CKTOTAL 43*     BNP (last 3 results) Recent Labs    06/15/22 2217  BNP 621.4*        Cultures:    Component Value Date/Time   SDES URINE, RANDOM 07/05/2008 1022   SPECREQUEST NONE 07/05/2008 1022   CULT ESCHERICHIA COLI 07/05/2008 1022   REPTSTATUS 07/07/2008 FINAL 07/05/2008 1022     Radiological Exams on Admission: DG Chest Portable 1 View  Result Date: 06/15/2022 CLINICAL DATA:  Hypoxia and shortness of breath EXAM: PORTABLE CHEST 1 VIEW COMPARISON:  Chest x-ray 03/30/2016 FINDINGS: The heart size and mediastinal contours are within normal limits. Both lungs are clear. The visualized skeletal structures are unremarkable. IMPRESSION: No active disease. Electronically Signed   By: Ronney Asters M.D.   On: 06/15/2022 22:27   _______________________________________________________________________________________________________ Latest  Blood pressure 112/68, pulse (!) 52, temperature 98.1 F (36.7 C), temperature source Oral, resp. rate 13, height '5\' 11"'$  (1.803 m), weight 115.2 kg, SpO2 93 %.   Vitals  labs and radiology finding personally reviewed  Review of Systems:    Pertinent positives include:  shortness of breath at rest.   dyspnea on exertion, non-productive cough wheezing.  Constitutional:  No weight loss, night sweats, Fevers, chills, fatigue, weight loss  HEENT:  No headaches, Difficulty swallowing,Tooth/dental problems,Sore throat,  No sneezing, itching, ear ache, nasal congestion, post nasal drip,  Cardio-vascular:  No chest pain, Orthopnea, PND, anasarca, dizziness, palpitations.no Bilateral lower extremity swelling  GI:  No heartburn, indigestion, abdominal pain, nausea, vomiting, diarrhea, change in bowel habits, loss of appetite, melena, blood in stool, hematemesis Resp:  no  No excess mucus, no productive cough, No, No coughing up of blood.No change in color of mucus.No  Skin:  no rash or lesions. No jaundice GU:  no dysuria, change in color of urine, no urgency or frequency. No straining to urinate.  No flank pain.  Musculoskeletal:  No joint pain or no joint swelling. No decreased range of motion. No back pain.  Psych:  No change in mood or affect. No depression or anxiety. No memory loss.  Neuro: no localizing neurological complaints, no tingling, no weakness, no double vision, no gait abnormality, no slurred speech, no  confusion  All systems reviewed and apart from [place] all are negative _______________________________________________________________________________________________ Past Medical History:   Past Medical History:  Diagnosis Date   Arthritis    "maybe a little"   Asthma    "mild"   Atrial fibrillation ([place])    Atypical mole 08/16/2011   left upper back tx exc atypical prol.   Atypical mole 12/18/2012   solar lentigo atypical tx w/s   bilateral leg edema 09/11/2018   Cancer ([place])    prostate cancer   Dyspnea    Dyspnea on exertion 09/11/2018   Dysrhythmia    Afib with rapid ventricular   Essential hypertension 07/25/2018   Fatty liver    GERD (gastroesophageal reflux disease)    GI bleed 2018   Gout    History of blood transfusion 2018   Obstructive sleep  apnea 09/11/2018   OSA (obstructive sleep apnea)    Paroxysmal atrial fibrillation ([place]) 09/11/2018   Pre-diabetes       Past Surgical History:  Procedure Laterality Date   CARDIOVERSION N/A 10/08/2018   Procedure: CARDIOVERSION;  Surgeon: [name], MD;  Location: [facility] ENDOSCOPY;  Service: Cardiovascular;  Laterality: N/A;   CARDIOVERSION N/A 01/17/2019   Procedure: CARDIOVERSION;  Surgeon: [name], MD;  Location: [place];  Service: Cardiovascular;  Laterality: N/A;   COLONOSCOPY Left 03/10/2017   Procedure: COLONOSCOPY;  Surgeon: Arta Silence, MD;  Location: [facility] ENDOSCOPY;  Service: Endoscopy;  Laterality: Left;   ESOPHAGOGASTRODUODENOSCOPY (EGD) WITH PROPOFOL Left 03/09/2017   Procedure: ESOPHAGOGASTRODUODENOSCOPY (EGD) WITH PROPOFOL;  Surgeon: Ronnette Juniper, MD;  Location: [place];  Service: Gastroenterology;  Laterality: Left;   EYE SURGERY Bilateral    cataract    FINGER SURGERY Right    5th   Prostate     DaVinci    Social History:  Ambulatory   independently       reports that he quit smoking about 33 years ago. His smoking use included cigarettes. He started smoking about 63 years ago. He has a 25.00 pack-year smoking history. He has never used smokeless tobacco. He reports current alcohol use of about 25.0 standard drinks of alcohol per week. He reports that he does not use drugs.     Family History:   Family History  Problem Relation Age of Onset   Emphysema Father        smoked   ______________________________________________________________________________________________ Allergies: Allergies  Allergen Reactions   Amlodipine     Leg edema   Penicillins Hives    Has patient had a PCN reaction causing immediate rash, facial/tongue/throat swelling, SOB or lightheadedness with hypotension: No Has patient had a PCN reaction causing severe rash involving mucus membranes or skin necrosis: No Has patient had a PCN reaction that required hospitalization:  No Has patient had a PCN reaction occurring within the last 10 years: No--childhood reaction If all of the above answers are "NO", then may proceed with Cephalosporin use.     Prior to Admission medications   Medication Sig Start Date End Date Taking? Authorizing Provider  allopurinol (ZYLOPRIM) 100 MG tablet Take 100 mg by mouth daily.     [provider]  ammonium lactate (AMLACTIN) 12 % cream Apply 1 g topically as needed for dry skin (scaly feet).    [provider]  apixaban (ELIQUIS) 5 MG TABS tablet Take 1 tablet (5 mg total) by mouth 2 (two) times daily. 05/29/22   Ernst Spell, NP  atorvastatin (LIPITOR) 40 MG tablet Take 40 mg by mouth  daily.  05/05/18   [provider]  Cholecalciferol (VITAMIN D-3 PO) Take 2,000 Units by mouth daily.     [provider]  colchicine 0.6 MG tablet Take 0.6 mg by mouth daily as needed (gout).     [provider]  colesevelam (WELCHOL) 625 MG tablet Take 1,875 mg by mouth 2 (two) times daily with a meal.     [provider]  DULERA 100-5 MCG/ACT AERO USE 2 INHALATIONS TWICE A DAY 03/20/22   Tanda Rockers, MD  ENTRESTO 49-51 MG TAKE 1 TABLET BY MOUTH 2 TIMES DAILY. 12/02/21   Cantwell, Celeste C, PA-C  furosemide (LASIX) 40 MG tablet TAKE 1 TABLET (40 MG TOTAL) BY MOUTH DAILY AS NEEDED FOR FLUID OR EDEMA. 05/25/22 05/25/23  [name], MD  gabapentin (NEURONTIN) 100 MG capsule TAKE 1 CAPSULE BY MOUTH 3 TO 4 TIMES A DAY 03/29/22   Tanda Rockers, MD  hydrALAZINE (APRESOLINE) 50 MG tablet Take 1 tablet (50 mg total) by mouth 3 (three) times daily. 11/07/21   Cantwell, Celeste C, PA-C  isosorbide mononitrate (IMDUR) 120 MG 24 hr tablet Take 120 mg by mouth daily. 07/26/21   [provider]  L-Methylfolate-Algae-B12-B6 [place]) 3-90.314-2-35 MG CAPS Take 1 tablet by mouth 2 (two) times daily.     [provider]  Magnesium 400 MG TABS Take 400 mg by mouth daily at 12 noon.      [provider]  metoprolol tartrate (LOPRESSOR) 25 MG tablet Take 1 tablet (25 mg total) by mouth 2 (two) times daily. 11/07/21   Cantwell, Celeste C, PA-C  montelukast (SINGULAIR) 10 MG tablet TAKE 1 TABLET BY MOUTH AT BEDTIME 01/27/22   Tanda Rockers, MD  omeprazole (PRILOSEC) 20 MG capsule Take 20 mg by mouth 2 (two) times daily before a meal.    [provider]  Polyethyl Glycol-Propyl Glycol (SYSTANE OP) Place 1 drop into both eyes 3 (three) times daily as needed (dry eyes).    [provider]  sotalol (BETAPACE) 80 MG tablet TAKE 1/2 TABLET (40 MG TOTAL) BY MOUTH 2 TIMES DAILY. 05/10/22   [name], MD  thiamine (VITAMIN B-1) 100 MG tablet Take 100 mg by mouth daily.    [provider]    ___________________________________________________________________________________________________ Physical Exam:    06/16/2022   12:00 AM 06/15/2022    9:10 PM 06/15/2022    9:09 PM  Vitals with BMI  Height  '5\' 11"'$    Weight  254 lbs   BMI  65.78   Systolic 469  629  Diastolic 68  62  Pulse 52  58    1. General:  in No  Acute distress    Chronically ill  -appearing 2. Psychological: Alert and   Oriented 3. Head/ENT:    Dry Mucous Membranes                          Head Non traumatic, neck supple                           Poor Dentition 4. SKIN:  decreased Skin turgor,  Skin clean Dry and intact no rash 5. Heart: Regular rate and rhythm no  Murmur, no Rub or gallop 6. Lungs:   wheezes bilaterally  no crackles   7. Abdomen: Soft,  non-tender, Non distended   obese  bowel sounds present 8. Lower extremities: no clubbing, cyanosis,  trace edema 9. Neurologically Grossly intact, moving all 4 extremities equally  no tremor 10. MSK: Normal range of motion    Chart has been reviewed ________  Assessment/Plan 81 y.o. male with medical history significant of  COPD/asthma/diastolic CHF/A-fib on Eliquis, prostate cancer, HLD, fatty liver, HTN, GERD, history of GI  bleed, gout, OSA    Admitted for   Hypoxia, COPD exacerbation and AKI     Present on Admission:  Acute on chronic diastolic (congestive) heart failure (HCC)  AKI (acute kidney injury) ([place])  COPD with acute exacerbation (HCC)  Paroxysmal atrial fibrillation (HCC)  ETOH abuse  Acute respiratory failure with hypoxia (HCC)     Acute on chronic diastolic (congestive) heart failure ([place]) - Pt diagnosed with CHF based on presence of the following:  rales on exam,  and   bilateral leg edema   admit on telemetry,  cycle cardiac enzymes, Troponin 16    obtain serial ECG  to evaluate for ischemia as a cause of heart failure  monitor daily weight:  Filed Weights   06/15/22 2110  Weight: 115.2 kg   Last BNP BNP (last 3 results) Recent Labs    06/15/22 2217  BNP 621.4*    ProBNP (last 3 results) Recent Labs    11/29/21 1342 12/06/21 1357  PROBNP 755* 449      cardiology emailed, will be difficult to manage in the setting of AKI   AKI (acute kidney injury) ([place]) Obtain urine electrolytes Obtain renal ultrasound given severity. Hold off on Entresto and Lasix for now Leg edema could be also secondary to Neurontin Shortness of breath could be in the setting of COPD exacerbation  COPD with acute exacerbation ([place])  -  - Will initiate: Steroid taper  -  Antibiotics   Doxycycline, - Albuterol  PRN, - scheduled duoneb,  -  Breo or Dulera at discharge   -  Mucinex.  Titrate O2 to saturation >90%. Follow patients respiratory status.  Order nfluenza PCR   VBG    Currently mentating well no evidence of symptomatic hypercarbia   Paroxysmal atrial fibrillation (HCC) Continue Eliquis at 5 mg p.o. daily Hold off on beta-blocker  ETOH abuse Monitor for any signs of withdrawal Cont thiamin CIWa protocol  Acute respiratory failure with hypoxia ([place])  this patient has acute respiratory failure with Hypoxia  as documented by the presence of following: O2 saturatio< 90% on RA    Likely due to:  COPD exacerbation,  Provide O2 therapy and titrate as needed  Continuous pulse ox   check Pulse ox with ambulation prior to discharge   may need  TC consult for home O2 set up    flutter valve ordered   Other plan as per orders.  DVT prophylaxis:  elliquis    Code Status:    Code Status: Prior FULL CODE  as per patient   I had personally discussed CODE STATUS with patient and family    Family Communication:   Family   at  Bedside  plan of care was discussed  with  Wife,   Disposition Plan:       To home once workup is complete and patient is stable   Following barriers for discharge:                            Electrolytes corrected  Will need consultants to evaluate patient prior to discharge                       Would benefit from PT/OT eval prior to DC  Ordered                                       Consults called: email [facility]ARDIOL[facility]   to let them know pt is here  Admission status:  ED Disposition     ED Disposition  [place]: [place] [[id number]]  Level of Care: Progressive [102]  Admit to Progressive based on following criteria: CARDIOVASCULAR & THORACIC of moderate stability with acute coronary syndrome symptoms/low risk myocardial infarction/hypertensive urgency/arrhythmias/heart failure potentially compromising stability and stable post cardiovascular intervention patients.  May admit patient to [facility] or [facility] if equivalent level of care is available:: No  Covid Evaluation: Symptomatic Person Under Investigation (PUI) or recent exposure (last 10 days) *Testing Required*  Diagnosis: COPD with acute exacerbation [facility]) [[id number]]  Admitting Physician: Toy Baker [[id number]]  Attending Physician: Toy Baker [[id number]]  Certification:: I certify this patient will need inpatient services for at least 2 midnights  Estimated  Length of Stay: 2          inpatient     I Expect 2 midnight stay secondary to severity of patient's current illness need for inpatient interventions justified by the following:  hemodynamic instability despite optimal treatment ( hypoxia,  )   and extensive comorbidities including:  substance abuse   CHF COPD/asthmaChronic anticoagulation  That are currently affecting medical management.   I expect  patient to be hospitalized for 2 midnights requiring inpatient medical care.  Patient is at high risk for adverse outcome (such as loss of life or disability) if not treated.  Indication for inpatient stay as follows:    New or worsening hypoxia   Need for IV antibiotics     Level of care         progressive tele indefinitely please discontinue once patient no longer qualifies COVID-19 Labs    Lab Results  Component Value Date   [place] NEGATIVE 06/15/2022     Precautions: admitted as   Covid Negative   Hakim Minniefield 06/16/2022, 2:01 AM    [facility]     after 2 AM please page floor coverage PA If 7AM-7PM, please contact the day team taking care of the patient using [email or web address]   Patient was evaluated in the context of the global COVID-19 pandemic, which necessitated consideration that the patient might be at risk for infection with the SARS-CoV-2 virus that causes COVID-19. Institutional protocols and algorithms that pertain to the evaluation of patients at risk for COVID-19 are in a state of rapid change based on information released by regulatory bodies including the CDC and federal and state organizations. These policies and algorithms were followed during the patient's care.

## 2022-06-16 NOTE — Assessment & Plan Note (Signed)
this patient has acute respiratory failure with Hypoxia   as documented by the presence of following: O2 saturatio< 90% on RA   Likely due to:  COPD exacerbation,  Provide O2 therapy and titrate as needed  Continuous pulse ox  check Pulse ox with ambulation prior to discharge   may need  TC consult for home O2 set up  flutter valve ordered  

## 2022-06-16 NOTE — Progress Notes (Signed)
Initial Nutrition Assessment  DOCUMENTATION CODES:   Obesity unspecified  INTERVENTION:  Encourage adequate PO intake Ensure Enlive po BID, each supplement provides 350 kcal and 20 grams of protein.  NUTRITION DIAGNOSIS:   Increased nutrient needs related to acute illness as evidenced by estimated needs.  GOAL:   Patient will meet greater than or equal to 90% of their needs  MONITOR:   Supplement acceptance, PO intake, Diet advancement, Labs, Weight trends  REASON FOR ASSESSMENT:     Assessment of nutrition requirement/status  ASSESSMENT:   Pt admitted with SOB r/t COPD exacerbation. PMH significant for COPD, asthma, diastolic CHF, afib, prostate cancer, HLD, fatty liver, HTN, GERD, GIB, gout, OSA.  Attempted to reach pt via phone call to room. Phone rang with busy tone. Unable to obtain detailed nutrition related history at this time.  Noted pt report of drinking 3-4 liquid drinks/day for many years.   No documented meal completions on file to review.    D/t inability to obtain nutrition history and no current meal documentation, will add nutrition supplements to optimize PO intake and adjust as appropriate throughout admission.   Reviewed weight history. Within the last year, his weight appears to have fluctuated up and down between 117-122 kg. Of note, pt was recently diagnosed with CHF and started on lasix which could be likely cause of weight fluctuations.   Current admit weight: 115.2 kg- unclear whether this is actual or stated Edema: deep pitting BLE  Medications: folvite, MVI, lokelma, thiamine, IV NaCl @ 33m/hr  Labs: sodium 131, potassium 6.2, BUN 67, Cr 3.50, Phos 6.0, GFR 17  NUTRITION - FOCUSED PHYSICAL EXAM: RD working remotely. Deferred to follow up.   Diet Order:   Diet Order             Diet Heart Room service appropriate? Yes; Fluid consistency: Thin  Diet effective now                   EDUCATION NEEDS:   No education needs have  been identified at this time  Skin:  Skin Assessment: Reviewed RN Assessment  Last BM:  PTA  Height:   Ht Readings from Last 1 Encounters:  06/15/22 '5\' 11"'$  (1.803 m)    Weight:   Wt Readings from Last 1 Encounters:  06/15/22 115.2 kg    Ideal Body Weight:  78.2 kg  BMI:  Body mass index is 35.43 kg/m.  Estimated Nutritional Needs:   Kcal:  1900-2100  Protein:  95-110g  Fluid:  >/=1.9L  AClayborne Dana RDN, LDN Clinical Nutrition

## 2022-06-16 NOTE — Hospital Course (Addendum)
42 male PMH including COPD, asthma, diastolic CHF, atrial fibrillation presenting with shortness of breath and hypoxia.  Admitted for acute hypoxic respiratory failure, exacerbation, AKI, acute on chronic diastolic CHF.

## 2022-06-17 DIAGNOSIS — N170 Acute kidney failure with tubular necrosis: Secondary | ICD-10-CM

## 2022-06-17 DIAGNOSIS — J441 Chronic obstructive pulmonary disease with (acute) exacerbation: Secondary | ICD-10-CM | POA: Diagnosis not present

## 2022-06-17 DIAGNOSIS — I480 Paroxysmal atrial fibrillation: Secondary | ICD-10-CM | POA: Diagnosis not present

## 2022-06-17 DIAGNOSIS — J9601 Acute respiratory failure with hypoxia: Secondary | ICD-10-CM | POA: Diagnosis not present

## 2022-06-17 DIAGNOSIS — I5033 Acute on chronic diastolic (congestive) heart failure: Secondary | ICD-10-CM

## 2022-06-17 DIAGNOSIS — N179 Acute kidney failure, unspecified: Secondary | ICD-10-CM | POA: Diagnosis not present

## 2022-06-17 LAB — COMPREHENSIVE METABOLIC PANEL
ALT: 14 U/L (ref 0–44)
AST: 22 U/L (ref 15–41)
Albumin: 3.3 g/dL — ABNORMAL LOW (ref 3.5–5.0)
Alkaline Phosphatase: 88 U/L (ref 38–126)
Anion gap: 10 (ref 5–15)
BUN: 66 mg/dL — ABNORMAL HIGH (ref 8–23)
CO2: 23 mmol/L (ref 22–32)
Calcium: 9.2 mg/dL (ref 8.9–10.3)
Chloride: 101 mmol/L (ref 98–111)
Creatinine, Ser: 3.61 mg/dL — ABNORMAL HIGH (ref 0.61–1.24)
GFR, Estimated: 16 mL/min — ABNORMAL LOW (ref >60)
Glucose, Bld: 189 mg/dL — ABNORMAL HIGH (ref 70–99)
Potassium: 5.6 mmol/L — ABNORMAL HIGH (ref 3.5–5.1)
Sodium: 134 mmol/L — ABNORMAL LOW (ref 135–145)
Total Bilirubin: 0.5 mg/dL (ref 0.3–1.2)
Total Protein: 6.5 g/dL (ref 6.5–8.1)

## 2022-06-17 LAB — BASIC METABOLIC PANEL
Anion gap: 9 (ref 5–15)
BUN: 69 mg/dL — ABNORMAL HIGH (ref 8–23)
CO2: 24 mmol/L (ref 22–32)
Calcium: 8.9 mg/dL (ref 8.9–10.3)
Chloride: 102 mmol/L (ref 98–111)
Creatinine, Ser: 3.43 mg/dL — ABNORMAL HIGH (ref 0.61–1.24)
GFR, Estimated: 17 mL/min — ABNORMAL LOW (ref >60)
Glucose, Bld: 172 mg/dL — ABNORMAL HIGH (ref 70–99)
Potassium: 6 mmol/L — ABNORMAL HIGH (ref 3.5–5.1)
Sodium: 135 mmol/L (ref 135–145)

## 2022-06-17 LAB — OSMOLALITY, URINE: Osmolality, Ur: 317 mOsm/kg (ref 300–900)

## 2022-06-17 LAB — POTASSIUM: Potassium: 5.1 mmol/L (ref 3.5–5.1)

## 2022-06-17 LAB — OSMOLALITY: Osmolality: 310 mOsm/kg — ABNORMAL HIGH (ref 275–295)

## 2022-06-17 MED ORDER — SODIUM ZIRCONIUM CYCLOSILICATE 10 G PO PACK
10.0000 g | PACK | Freq: Two times a day (BID) | ORAL | Status: AC
  Administered 2022-06-17 (×2): 10 g via ORAL
  Filled 2022-06-17 (×2): qty 1

## 2022-06-17 MED ORDER — SODIUM CHLORIDE 0.9 % IV BOLUS
500.0000 mL | Freq: Once | INTRAVENOUS | Status: AC
  Administered 2022-06-17: 500 mL via INTRAVENOUS

## 2022-06-17 NOTE — Progress Notes (Signed)
SATURATION QUALIFICATIONS: (This note is used to comply with regulatory documentation for home oxygen)  Patient Saturations on Room Air at Rest = 90%  Patient Saturations on Room Air while Ambulating = 86%  Patient Saturations on 3 Liters of oxygen while Ambulating = 90%  Please briefly explain why patient needs home oxygen: Pt is requiring 3L of supplemental O2 to maintain sats >/= 90% while ambulating.   Moishe Spice, PT, DPT Acute Rehabilitation Services  Office: [phone]

## 2022-06-17 NOTE — Progress Notes (Signed)
  Progress Note   Patient: Darren [name] TWS:[id number] DOB: November 04, 1940 DOA: 06/15/2022     1 DOS: the patient was seen and examined on 06/17/2022   Brief hospital course: 12 male PMH including COPD, asthma, diastolic CHF, atrial fibrillation presenting with shortness of breath and hypoxia.  Admitted for acute hypoxic respiratory failure, exacerbation, AKI, acute on chronic diastolic CHF. His presentation is more consistent with acute COPD exacerbation and complication of excess diuresis with acute renal failure then acute decompensated diastolic heart failure.   Assessment and Plan: Acute hypoxic respiratory failure secondary to COPD exacerbation, CHF exacerbation Clinically improved.  Continue bronchodilators, steroids.   AKI (acute kidney injury) ([place]) with hyperkalemia Modest improvement.  Urine output incompletely recorded.  Continue to hold Entresto and Lasix. Repeat Lokelma and fluids, follow potassium given recurrent hyperkalemia. Trend BMP.   COPD with acute exacerbation ([place]) Appears to be improved.  Continue bronchodilators and steroids.  Doxycycline.  Acute on chronic diastolic (congestive) heart failure (HCC) Chest x-ray no acute disease.  Now appears dry. Precipitated by poor diet and acute exacerbation of his COPD  Hold Entresto, diuretics. Sotalol discontined by Dr Einar Gip Will have to wait for renal function to improve to re-initiate Sotalol 40 mg BID and probably stop Entresto and start ARB when stable or as outpatient.   Paroxysmal atrial fibrillation (HCC) Continue Eliquis at 5 mg p.o. daily Hold off on beta-blocker   ETOH abuse No signs of withdrawal noted.  CIWA  Obesity unspecified Encourage adequate PO intake Ensure Enlive po BID, each supplement provides 350 kcal and 20 grams of protein  Overall improving.  Likely home when renal function significantly improved.  Subjective:  Feels better Breathing better  Physical Exam: Vitals:   06/17/22 0400  06/17/22 0451 06/17/22 0810 06/17/22 0925  BP:  (!) 140/76 136/70 139/80  Pulse:  60 66   Resp: '15 16 18   '$ Temp:  98 F (36.7 C) 97.7 F (36.5 C)   TempSrc:  Oral Oral   SpO2: 96% 93% 99% 93%  Weight:      Height:       Physical Exam Vitals reviewed.  Constitutional:      General: He is not in acute distress.    Appearance: He is not ill-appearing or toxic-appearing.  Cardiovascular:     Rate and Rhythm: Normal rate and regular rhythm.     Heart sounds: No murmur heard. Pulmonary:     Effort: Pulmonary effort is normal. No respiratory distress.     Breath sounds: Wheezing present. No rhonchi or rales.  Musculoskeletal:     Right lower leg: Edema present.     Left lower leg: Edema present.     Comments: Mild BLE edema  Neurological:     Mental Status: He is alert.  Psychiatric:        Mood and Affect: Mood normal.        Behavior: Behavior normal.     Data Reviewed: K+ 6.0 Creatinine down 3.61 > 3.43  Family Communication: 2 sons, 1 nephew, all at bedside  Disposition: Status is: Inpatient Remains inpatient appropriate because: COPD exacerbation, hypoxia, AKI  Planned Discharge Destination: Home    Time spent: 25 minutes  Author: Murray Hodgkins, MD 06/17/2022 3:39 PM  For on call review [email or web address].

## 2022-06-17 NOTE — Plan of Care (Signed)

## 2022-06-17 NOTE — Evaluation (Signed)
Occupational Therapy Evaluation Patient Details Name: Darren [name] MRN: [id number] DOB: 25-Aug-1940 Today's Date: 06/17/2022   History of Present Illness Pt is a 81 y/o M presenting to ED on 12/7 wtih SOB and BLE edema, newly diagnosed CHF last week. Admitted for COPD with acute exacerbation. PMH includes COPD, CHF, A fib on Eliquis, prostate CA, HLD, HTN, and OSA.   Clinical Impression   Pt needing assist for LB ADL at baseline, ind with mobility, lives with spouse who can assist at d/c. Pt currently needing set up -mod A for ADLs, min A for bed mobility, and min guard A for transfers without AD. Noted pt reaching out for external support on bedrails/counters with ambulation to bathroom. VSS on 3L O2 throughout session. Pt presenting with impairments listed below, will follow acutely. Recommend HHOT at d/c.   Recommendations for follow up therapy are one component of a multi-disciplinary discharge planning process, led by the attending physician.  Recommendations may be updated based on patient status, additional functional criteria and insurance authorization.   Follow Up Recommendations  Home health OT     Assistance Recommended at Discharge Set up Supervision/Assistance  Patient can return home with the following A little help with walking and/or transfers;A little help with bathing/dressing/bathroom;Assistance with cooking/housework;Assist for transportation;Help with stairs or ramp for entrance    Functional Status Assessment  Patient has had a recent decline in their functional status and demonstrates the ability to make significant improvements in function in a reasonable and predictable amount of time.  Equipment Recommendations  None recommended by OT (pt has all needed DME)    Recommendations for Other Services PT consult     Precautions / Restrictions Precautions Precautions: Fall Restrictions Weight Bearing Restrictions: No      Mobility Bed Mobility Overal bed  mobility: Needs Assistance Bed Mobility: Supine to Sit     Supine to sit: Min assist     General bed mobility comments: for trunk elevation    Transfers Overall transfer level: Needs assistance Equipment used: None Transfers: Sit to/from Stand Sit to Stand: Min guard                  Balance Overall balance assessment: Needs assistance Sitting-balance support: Feet supported Sitting balance-Leahy Scale: Good     Standing balance support: During functional activity Standing balance-Leahy Scale: Fair Standing balance comment: reaching out for external support (countertop)                           ADL either performed or assessed with clinical judgement   ADL Overall ADL's : Needs assistance/impaired Eating/Feeding: Set up   Grooming: Wash/dry hands;Standing;Min guard   Upper Body Bathing: Min guard   Lower Body Bathing: Moderate assistance   Upper Body Dressing : Min guard   Lower Body Dressing: Moderate assistance   Toilet Transfer: Min guard;Ambulation   Toileting- Clothing Manipulation and Hygiene: Min guard       Functional mobility during ADLs: Min guard       Vision   Vision Assessment?: No apparent visual deficits     Perception Perception Perception Tested?: No   Praxis Praxis Praxis tested?: Not tested    Pertinent Vitals/Pain Pain Assessment Pain Assessment: No/denies pain     Hand Dominance     Extremity/Trunk Assessment Upper Extremity Assessment Upper Extremity Assessment: Overall WFL for tasks assessed   Lower Extremity Assessment Lower Extremity Assessment: Defer to PT evaluation  Cervical / Trunk Assessment Cervical / Trunk Assessment: Normal   Communication Communication Communication: No difficulties   Cognition Arousal/Alertness: Awake/alert Behavior During Therapy: WFL for tasks assessed/performed Overall Cognitive Status: Within Functional Limits for tasks assessed                                        General Comments  VSS on 3L O2    Exercises     Shoulder Instructions      Home Living Family/patient expects to be discharged to:: Private residence Living Arrangements: Spouse/significant other Available Help at Discharge: Family;Available 24 hours/day Type of Home: House Home Access: Stairs to enter [employer] of Steps: 2   Home Layout: One level     Bathroom Shower/Tub: Occupational psychologist: Standard     Home Equipment: Grab bars - toilet;Grab bars - tub/shower;Cane - single Barista (2 wheels);Shower seat - built in;Hand held shower head   Additional Comments: CPAP at night, no O2 at baseline      Prior Functioning/Environment Prior Level of Function : Independent/Modified Independent             Mobility Comments: no AD use ADLs Comments: occasional assist for LB dressing        OT Problem List: Decreased strength;Decreased range of motion;Decreased activity tolerance;Impaired balance (sitting and/or standing);Cardiopulmonary status limiting activity      OT Treatment/Interventions: Self-care/ADL training;Therapeutic exercise;Energy conservation;DME and/or AE instruction;Therapeutic activities;Patient/family education;Balance training    OT Goals(Current goals can be found in the care plan section) Acute Rehab OT Goals OT Goal Formulation: With patient Time For Goal Achievement: 07/01/22 Potential to Achieve Goals: Good ADL Goals Pt Will Perform Upper Body Dressing: Independently;sitting Pt Will Perform Lower Body Dressing: with min assist;sit to/from stand;sitting/lateral leans Pt Will Transfer to Toilet: Independently;ambulating;regular height toilet Additional ADL Goal #1: pt will verbalize 3 energy conservation strategies in order to improve activity tolerance for ADLs  OT Frequency: Min 2X/week    Co-evaluation              AM-PAC OT "6 Clicks" Daily Activity     Outcome  Measure Help from another person eating meals?: None Help from another person taking care of personal grooming?: A Little Help from another person toileting, which includes using toliet, bedpan, or urinal?: A Little Help from another person bathing (including washing, rinsing, drying)?: A Lot Help from another person to put on and taking off regular upper body clothing?: A Little Help from another person to put on and taking off regular lower body clothing?: A Lot 6 Click Score: 17   End of Session Equipment Utilized During Treatment: Oxygen Nurse Communication: Mobility status  Activity Tolerance: Patient tolerated treatment well Patient left: in chair;with call bell/phone within reach;with chair alarm set  OT Visit Diagnosis: Unsteadiness on feet (R26.81);Other abnormalities of gait and mobility (R26.89);Muscle weakness (generalized) (M62.81)                Time: 5400-8676 OT Time Calculation (min): 32 min Charges:  OT General Charges $OT Visit: 1 Visit OT Evaluation $OT Eval Moderate Complexity: 1 Mod OT Treatments $Self Care/Home Management : 8-22 mins  Renaye Rakers, OTD, OTR/L [employer] Preferred Acute Rehab [phone]   Renaye Rakers [place] 06/17/2022, 12:57 PM

## 2022-06-17 NOTE — Evaluation (Signed)
Physical Therapy Evaluation Patient Details Name: Darren [name] MRN: [id number] DOB: 1941-06-27 Today's Date: 06/17/2022  History of Present Illness  Pt is a 81 y/o M presenting to ED on 12/7 wtih SOB and BLE edema, newly diagnosed CHF last week. Admitted for COPD with acute exacerbation. PMH includes COPD, CHF, A fib on Eliquis, prostate CA, HLD, HTN, and OSA.   Clinical Impression  Pt presents with condition above and deficits mentioned below, see PT Problem List. PTA, he was mod I for functional mobility, often furniture surfing due to a hx of instability. Pt endorses x2 falls in past 6 months when he turned too quickly. He lives with his wife in a 1-level house with 2 STE. Currently, pt appears to be functioning close to his baseline, demonstrating deficits in balance in which pt would often reach out to support himself on the furniture or walls. Pt verbalized understanding that he is at risk for falls and recommendation to use his cane or RW at d/c or at least hold onto furniture as he has been to reduce this risk. Pt is also displaying deficits in activity tolerance/aerobic endurance, currently needing 3L of O2 to maintain sats >/= 90% with mobility. He is able to maintain sats >/= 90% on RA at rest while awake though. Pt declining a desire to follow-up with HH/OPPT upon d/c, but agreeable to acute PT services to further address his deficits.       Recommendations for follow up therapy are one component of a multi-disciplinary discharge planning process, led by the attending physician.  Recommendations may be updated based on patient status, additional functional criteria and insurance authorization.  Follow Up Recommendations No PT follow up (pt declining desire for HH/OPPT follow-up)      Assistance Recommended at Discharge PRN  Patient can return home with the following  A little help with bathing/dressing/bathroom;Assistance with cooking/housework;Help with stairs or ramp for  entrance;Assist for transportation    Equipment Recommendations None recommended by PT  Recommendations for Other Services       Functional Status Assessment Patient has had a recent decline in their functional status and demonstrates the ability to make significant improvements in function in a reasonable and predictable amount of time.     Precautions / Restrictions Precautions Precautions: Fall Precaution Comments: watch SpO2 Restrictions Weight Bearing Restrictions: No      Mobility  Bed Mobility               General bed mobility comments: Pt sitting up in recliner upon arrival    Transfers Overall transfer level: Needs assistance Equipment used: None Transfers: Sit to/from Stand Sit to Stand: Min guard           General transfer comment: Min guard for safety, no LOB    Ambulation/Gait Ambulation/Gait assistance: Min guard Gait Distance (Feet): 280 Feet Assistive device: None, 1 person hand held assist Gait Pattern/deviations: Step-through pattern, Decreased stride length, Drifts right/left Gait velocity: reduced Gait velocity interpretation: 1.31 - 2.62 ft/sec, indicative of limited community ambulator   General Gait Details: Pt with mildly unsteady gait, drifiting/staggering laterally intermittently but pt able to regain balance without assistance. Pt reaching for wall or furniture intermittently for support throughout session, but reports he does this at baseline. Min guard for Scientist, research (medical)    Modified Rankin (Stroke Patients Only)       Balance Overall balance assessment: Needs assistance Sitting-balance  support: Feet supported Sitting balance-Leahy Scale: Good     Standing balance support: During functional activity, No upper extremity supported Standing balance-Leahy Scale: Fair Standing balance comment: reaching out for external support intermittently and unsteadiness noted                              Pertinent Vitals/Pain Pain Assessment Pain Assessment: No/denies pain    Home Living Family/patient expects to be discharged to:: Private residence Living Arrangements: Spouse/significant other Available Help at Discharge: Family;Available 24 hours/day Type of Home: House Home Access: Stairs to enter   [employer] of Steps: 2   Home Layout: One level Home Equipment: Grab bars - toilet;Grab bars - tub/shower;Cane - single Barista (2 wheels);Shower seat - built in;Hand held shower head Additional Comments: CPAP at night, no O2 at baseline    Prior Function Prior Level of Function : Independent/Modified Independent             Mobility Comments: no AD use, but often would furniture surf. x2 falls in past 6 months when turning too quickly ADLs Comments: occasional assist for LB dressing     Hand Dominance        Extremity/Trunk Assessment   Upper Extremity Assessment Upper Extremity Assessment: Defer to OT evaluation    Lower Extremity Assessment Lower Extremity Assessment: LLE deficits/detail;RLE deficits/detail RLE Deficits / Details: hx of peripheral neuropathy; WFL strength for tasks performed RLE Sensation: history of peripheral neuropathy LLE Deficits / Details: hx of peripheral neuropathy; WFL strength for tasks performed LLE Sensation: history of peripheral neuropathy    Cervical / Trunk Assessment Cervical / Trunk Assessment: Normal  Communication   Communication: No difficulties  Cognition Arousal/Alertness: Awake/alert Behavior During Therapy: WFL for tasks assessed/performed Overall Cognitive Status: Within Functional Limits for tasks assessed                                          General Comments General comments (skin integrity, edema, etc.): SpO2 >/= 90% on RA at rest, down to 86% on 2L with gait, >/= 90% on 3L with gait; encouraged pt to use his cane/RW at d/c for improved stability or  at least hold onto furniture as he is at risk for falls. He verbalized understanding    Exercises     Assessment/Plan    PT Assessment Patient needs continued PT services  PT Problem List Decreased activity tolerance;Decreased balance;Decreased mobility;Impaired sensation;Cardiopulmonary status limiting activity       PT Treatment Interventions DME instruction;Gait training;Stair training;Functional mobility training;Therapeutic activities;Therapeutic exercise;Balance training;Neuromuscular re-education;Patient/family education    PT Goals (Current goals can be found in the Care Plan section)  Acute Rehab PT Goals Patient Stated Goal: to improve his breathing PT Goal Formulation: With patient/family Time For Goal Achievement: 07/01/22 Potential to Achieve Goals: Good    Frequency Min 3X/week     Co-evaluation               AM-PAC PT "6 Clicks" Mobility  Outcome Measure Help needed turning from your back to your side while in a flat bed without using bedrails?: None Help needed moving from lying on your back to sitting on the side of a flat bed without using bedrails?: A Little Help needed moving to and from a bed to a chair (including a wheelchair)?: A Little Help needed  standing up from a chair using your arms (e.g., wheelchair or bedside chair)?: A Little Help needed to walk in hospital room?: A Little Help needed climbing 3-5 steps with a railing? : A Little 6 Click Score: 19    End of Session Equipment Utilized During Treatment: Gait belt;Oxygen Activity Tolerance: Patient tolerated treatment well Patient left: in chair;with call bell/phone within reach;with chair alarm set;with family/visitor present   PT Visit Diagnosis: Unsteadiness on feet (R26.81);Other abnormalities of gait and mobility (R26.89);Difficulty in walking, not elsewhere classified (R26.2)    Time: 4496-7591 PT Time Calculation (min) (ACUTE ONLY): 25 min   Charges:   PT Evaluation $PT Eval  Moderate Complexity: 1 Mod PT Treatments $Therapeutic Activity: 8-22 mins        Moishe Spice, PT, DPT Acute Rehabilitation Services  Office: [phone]   Orvan Falconer 06/17/2022, 2:50 PM

## 2022-06-18 DIAGNOSIS — J441 Chronic obstructive pulmonary disease with (acute) exacerbation: Secondary | ICD-10-CM | POA: Diagnosis not present

## 2022-06-18 DIAGNOSIS — N179 Acute kidney failure, unspecified: Secondary | ICD-10-CM | POA: Diagnosis not present

## 2022-06-18 DIAGNOSIS — J9601 Acute respiratory failure with hypoxia: Secondary | ICD-10-CM | POA: Diagnosis not present

## 2022-06-18 LAB — BASIC METABOLIC PANEL
Anion gap: 9 (ref 5–15)
BUN: 71 mg/dL — ABNORMAL HIGH (ref 8–23)
CO2: 24 mmol/L (ref 22–32)
Calcium: 8.4 mg/dL — ABNORMAL LOW (ref 8.9–10.3)
Chloride: 106 mmol/L (ref 98–111)
Creatinine, Ser: 2.81 mg/dL — ABNORMAL HIGH (ref 0.61–1.24)
GFR, Estimated: 22 mL/min — ABNORMAL LOW (ref >60)
Glucose, Bld: 172 mg/dL — ABNORMAL HIGH (ref 70–99)
Potassium: 4.7 mmol/L (ref 3.5–5.1)
Sodium: 139 mmol/L (ref 135–145)

## 2022-06-18 MED ORDER — IPRATROPIUM-ALBUTEROL 0.5-2.5 (3) MG/3ML IN SOLN
3.0000 mL | Freq: Three times a day (TID) | RESPIRATORY_TRACT | Status: DC
  Administered 2022-06-18: 3 mL via RESPIRATORY_TRACT
  Filled 2022-06-18: qty 3

## 2022-06-18 NOTE — Progress Notes (Signed)
Mobility Specialist Progress Note:   06/18/22 1057  Mobility  Activity Ambulated with assistance in hallway  Level of Assistance Standby assist, set-up cues, supervision of patient - no hands on  Assistive Device Front wheel walker  Distance Ambulated (ft) 280 ft  Activity Response Tolerated well  $Mobility charge 1 Mobility   Pt received in chair willing to participate in mobility. No complaints of pain. Left in chair with call bell in reach and all needs met. Encouraged use of flutter valve and IS.   Gareth Eagle Harald Quevedo Mobility Specialist Please contact via [employer] or  Rehab Office at [phone]

## 2022-06-18 NOTE — Progress Notes (Signed)
  Progress Note   Patient: Darren [name] QAS:[id number] DOB: May 09, 1941 DOA: 06/15/2022     2 DOS: the patient was seen and examined on 06/18/2022   Brief hospital course: 18 male PMH including COPD, asthma, diastolic CHF, atrial fibrillation presenting with shortness of breath and hypoxia.  Admitted for acute hypoxic respiratory failure, exacerbation, AKI, acute on chronic diastolic CHF. His presentation is more consistent with acute COPD exacerbation and complication of excess diuresis with acute renal failure then acute decompensated diastolic heart failure.   Assessment and Plan: Acute hypoxic respiratory failure secondary to COPD exacerbation, CHF exacerbation Clinically improved.  Continue bronchodilators, steroids. Pt is requiring 3L of supplemental O2 to maintain sats >/= 90% while ambulating.    AKI (acute kidney injury) ([place]) with hyperkalemia Improved today, now on activity.  Potassium has normalized.  Continue to hold Entresto and Lasix. Trend BMP. Creatinine 3.43 > 2.81   COPD with acute exacerbation ([place]) Still has some wheezing but clinically appears better.  Continue bronchodilators and steroids.  Doxycycline.   Acute on chronic diastolic (congestive) heart failure (HCC) Chest x-ray no acute disease.   Precipitated by poor diet and acute exacerbation of his COPD  Hold Entresto, diuretics. Sotalol discontined by Dr Einar Gip Will have to wait for renal function to improve to re-initiate Sotalol 40 mg BID and probably stop Entresto and start ARB when stable or as outpatient.   Paroxysmal atrial fibrillation (HCC) Continue Eliquis at 5 mg p.o. daily Hold off on beta-blocker   ETOH abuse No signs of withdrawal noted.  CIWA   Obesity unspecified Encourage adequate PO intake Ensure Enlive po BID, each supplement provides 350 kcal and 20 grams of protein      Subjective:  Patient Saturations on Room Air while Ambulating = 86%  Pt is requiring 3L of supplemental O2 to  maintain sats >/= 90% while ambulating.   Feels better today  Physical Exam: Vitals:   06/18/22 0404 06/18/22 0801 06/18/22 0804 06/18/22 0837  BP: (!) 148/76   (!) 162/92  Pulse: 63     Resp: 17   16  Temp: (!) 97.2 F (36.2 C)   97.7 F (36.5 C)  TempSrc: Axillary   Oral  SpO2: 96% 95% 95% 97%  Weight: 123.7 kg     Height:       Physical Exam Vitals reviewed.  Constitutional:      General: He is not in acute distress.    Appearance: He is not ill-appearing or toxic-appearing.  Cardiovascular:     Rate and Rhythm: Normal rate and regular rhythm.     Heart sounds: No murmur heard. Pulmonary:     Effort: Pulmonary effort is normal. No respiratory distress.     Breath sounds: Wheezing present. No rhonchi or rales.  Neurological:     Mental Status: He is alert.  Psychiatric:        Mood and Affect: Mood normal.        Behavior: Behavior normal.     Data Reviewed: Creatinine 3.43 > 2.81  Family Communication: none present  Disposition: Status is: Inpatient Remains inpatient appropriate because: AKI   Planned Discharge Destination: Home    Time spent: 20 minutes  Author: Murray Hodgkins, MD 06/18/2022 8:47 AM  For on call review [email or web address].

## 2022-06-19 DIAGNOSIS — N179 Acute kidney failure, unspecified: Secondary | ICD-10-CM | POA: Diagnosis not present

## 2022-06-19 DIAGNOSIS — J441 Chronic obstructive pulmonary disease with (acute) exacerbation: Secondary | ICD-10-CM | POA: Diagnosis not present

## 2022-06-19 DIAGNOSIS — J9601 Acute respiratory failure with hypoxia: Secondary | ICD-10-CM | POA: Diagnosis not present

## 2022-06-19 LAB — BASIC METABOLIC PANEL
Anion gap: 8 (ref 5–15)
BUN: 64 mg/dL — ABNORMAL HIGH (ref 8–23)
CO2: 21 mmol/L — ABNORMAL LOW (ref 22–32)
Calcium: 8.1 mg/dL — ABNORMAL LOW (ref 8.9–10.3)
Chloride: 109 mmol/L (ref 98–111)
Creatinine, Ser: 2.38 mg/dL — ABNORMAL HIGH (ref 0.61–1.24)
GFR, Estimated: 27 mL/min — ABNORMAL LOW (ref >60)
Glucose, Bld: 133 mg/dL — ABNORMAL HIGH (ref 70–99)
Potassium: 5.3 mmol/L — ABNORMAL HIGH (ref 3.5–5.1)
Sodium: 138 mmol/L (ref 135–145)

## 2022-06-19 MED ORDER — SODIUM ZIRCONIUM CYCLOSILICATE 10 G PO PACK
10.0000 g | PACK | Freq: Once | ORAL | Status: AC
  Administered 2022-06-19: 10 g via ORAL
  Filled 2022-06-19: qty 1

## 2022-06-19 NOTE — Plan of Care (Signed)

## 2022-06-19 NOTE — Progress Notes (Signed)
Occupational Therapy Treatment Patient Details Name: Darren [name] MRN: [id number] DOB: February 01, 1941 Today's Date: 06/19/2022   History of present illness Pt is a 81 y/o M presenting to ED on 12/7 wtih SOB and BLE edema, newly diagnosed CHF last week. Admitted for COPD with acute exacerbation. PMH includes COPD, CHF, A fib on Eliquis, prostate CA, HLD, HTN, and OSA.   OT comments  Patient making good progress with OT treatment with patient performing mobility and tranfers with RW and min guard due to lines and safety. Patient able to be on RA with O2 at 96% and HR at 79. Patient was provided handout for energy conservation strategies and reviewed with patient. Patient would benefit from [facility] to address safety and independence with self care. Acute OT to continue to follow in this setting.    Recommendations for follow up therapy are one component of a multi-disciplinary discharge planning process, led by the attending physician.  Recommendations may be updated based on patient status, additional functional criteria and insurance authorization.    Follow Up Recommendations  Home health OT     Assistance Recommended at Discharge Set up Supervision/Assistance  Patient can return home with the following  A little help with walking and/or transfers;A little help with bathing/dressing/bathroom;Assistance with cooking/housework;Assist for transportation;Help with stairs or ramp for entrance   Equipment Recommendations  None recommended by OT (Patient has all needed DME)    Recommendations for Other Services      Precautions / Restrictions Precautions Precautions: Fall Precaution Comments: watch SpO2 Restrictions Weight Bearing Restrictions: No       Mobility Bed Mobility Overal bed mobility: Needs Assistance             General bed mobility comments: OOB in recliner upon entry and at end of session    Transfers Overall transfer level: Needs assistance Equipment used: Rolling  walker (2 wheels) Transfers: Sit to/from Stand, Bed to chair/wheelchair/BSC Sit to Stand: Min guard           General transfer comment: Min guard for safety, no LOB     Balance Overall balance assessment: Needs assistance Sitting-balance support: Feet supported Sitting balance-Leahy Scale: Good     Standing balance support: Single extremity supported, Bilateral upper extremity supported, No upper extremity supported, During functional activity Standing balance-Leahy Scale: Fair Standing balance comment: able to stand at sink with no UE support for balance during self care                           ADL either performed or assessed with clinical judgement   ADL Overall ADL's : Needs assistance/impaired     Grooming: Wash/dry hands;Wash/dry face;Oral care;Supervision/safety;Standing Grooming Details (indicate cue type and reason): at sink                 Toilet Transfer: Min guard;Ambulation Toilet Transfer Details (indicate cue type and reason): min guard due to lines and safety. Simulated to chair                Extremity/Trunk Assessment              Vision       Perception     Praxis      Cognition Arousal/Alertness: Awake/alert Behavior During Therapy: WFL for tasks assessed/performed Overall Cognitive Status: Within Functional Limits for tasks assessed  Exercises      Shoulder Instructions       General Comments SpO2 on RA 96 HR 79.  Provided patient with handout for energy conservation strategies and reviewed with patient    Pertinent Vitals/ Pain       Pain Assessment Pain Assessment: No/denies pain  Home Living                                          Prior Functioning/Environment              Frequency  Min 2X/week        Progress Toward Goals  OT Goals(current goals can now be found in the care plan section)  Progress towards  OT goals: Progressing toward goals  Acute Rehab OT Goals Patient Stated Goal: go home OT Goal Formulation: With patient Time For Goal Achievement: 07/01/22 Potential to Achieve Goals: Good ADL Goals Pt Will Perform Upper Body Dressing: Independently;sitting Pt Will Perform Lower Body Dressing: with min assist;sit to/from stand;sitting/lateral leans Pt Will Transfer to Toilet: Independently;ambulating;regular height toilet Additional ADL Goal #1: pt will verbalize 3 energy conservation strategies in order to improve activity tolerance for ADLs  Plan Discharge plan remains appropriate    Co-evaluation                 AM-PAC OT "6 Clicks" Daily Activity     Outcome Measure   Help from another person eating meals?: None Help from another person taking care of personal grooming?: A Little Help from another person toileting, which includes using toliet, bedpan, or urinal?: A Little Help from another person bathing (including washing, rinsing, drying)?: A Little Help from another person to put on and taking off regular upper body clothing?: A Little Help from another person to put on and taking off regular lower body clothing?: A Little 6 Click Score: 19    End of Session Equipment Utilized During Treatment: Rolling walker (2 wheels)  OT Visit Diagnosis: Unsteadiness on feet (R26.81);Other abnormalities of gait and mobility (R26.89);Muscle weakness (generalized) (M62.81)   Activity Tolerance Patient tolerated treatment well   Patient Left in chair;with call bell/phone within reach;with chair alarm set   Nurse Communication Mobility status        Time: 9371-6967 OT Time Calculation (min): 23 min  Charges: OT General Charges $OT Visit: 1 Visit OT Treatments $Self Care/Home Management : 8-22 mins $Therapeutic Activity: 8-22 mins  Lodema Hong, [place]  Office [phone]   Trixie Dredge 06/19/2022, 11:25 AM

## 2022-06-19 NOTE — Progress Notes (Signed)
Physical Therapy Treatment Patient Details Name: Darren [name] MRN: [id number] DOB: October 24, 1940 Today's Date: 06/19/2022   History of Present Illness Pt is a 81 y/o M presenting to ED on 12/7 wtih SOB and BLE edema, newly diagnosed CHF last week. Admitted for COPD with acute exacerbation. PMH includes COPD, CHF, A fib on Eliquis, prostate CA, HLD, HTN, and OSA.    PT Comments    Patient progressing well towards PT goals. Session focused on ambulation progression, functional strengthening and energy conservation. Pt tolerated short distance walking in room with Min guard needing furniture support at times for balance. Trialed rollator in hallway for support needing supervision for safety. 1 standing rest break needed due to SOB. Pt reports not feeling like he needs a rollator at this time but possibly in the future. Instructed pt in functional sit to stands to do at home for strengthening and cardiovascular endurance. Sp02 remained >87% on RA throughout activity, however not the most reliable pleth with only mild SOB. Will continue to follow and progress as tolerated.   Recommendations for follow up therapy are one component of a multi-disciplinary discharge planning process, led by the attending physician.  Recommendations may be updated based on patient status, additional functional criteria and insurance authorization.  Follow Up Recommendations  No PT follow up (declines follow up HHPT services)     Assistance Recommended at Discharge PRN  Patient can return home with the following A little help with bathing/dressing/bathroom;Assistance with cooking/housework;Help with stairs or ramp for entrance;Assist for transportation   Equipment Recommendations  None recommended by PT    Recommendations for Other Services       Precautions / Restrictions Precautions Precautions: Fall;Other (comment) Precaution Comments: watch SpO2 Restrictions Weight Bearing Restrictions: No      Mobility  Bed Mobility               General bed mobility comments: OOB in recliner upon entry and at end of session    Transfers Overall transfer level: Needs assistance Equipment used: None, Rollator (4 wheels) Transfers: Sit to/from Stand Sit to Stand: Min guard           General transfer comment: Min guard for safety, no LOB, slow torise using UEs to push up. Stood from chair x6.    Ambulation/Gait Ambulation/Gait assistance: Min guard, Supervision Gait Distance (Feet): 350 Feet Assistive device: Rollator (4 wheels), None, IV Pole Gait Pattern/deviations: Step-through pattern, Decreased stride length Gait velocity: reduced Gait velocity interpretation: 1.31 - 2.62 ft/sec, indicative of limited community ambulator   General Gait Details: Slow, mildly unsteady gait without use of DME, progressing to supervision with use of rollator. Needing UE support within room furniture walking. 1 standing rest break due to 2-3/4 DOE. Sp02 remained >87% on RA, not the most reliable pleth and recovered quickly.   Stairs             Wheelchair Mobility    Modified Rankin (Stroke Patients Only)       Balance Overall balance assessment: Needs assistance Sitting-balance support: Feet supported, No upper extremity supported Sitting balance-Leahy Scale: Good     Standing balance support: During functional activity Standing balance-Leahy Scale: Fair Standing balance comment: Able to stand without Ue support for short periods but does better wtih UE support for hallway walking.                            Cognition Arousal/Alertness: Awake/alert Behavior During Therapy:  WFL for tasks assessed/performed Overall Cognitive Status: Within Functional Limits for tasks assessed                                          Exercises Other Exercises Other Exercises: sit to stand x5 from chair for strengthening and cardiovascular endurance     General Comments General comments (skin integrity, edema, etc.): Sp02 remained >87% on RA throughout activity, however not the most reliable pleth.      Pertinent Vitals/Pain Pain Assessment Pain Assessment: No/denies pain    Home Living                          Prior Function            PT Goals (current goals can now be found in the care plan section) Progress towards PT goals: Progressing toward goals    Frequency    Min 3X/week      PT Plan Current plan remains appropriate    Co-evaluation              AM-PAC PT "6 Clicks" Mobility   Outcome Measure  Help needed turning from your back to your side while in a flat bed without using bedrails?: None Help needed moving from lying on your back to sitting on the side of a flat bed without using bedrails?: A Little Help needed moving to and from a bed to a chair (including a wheelchair)?: A Little Help needed standing up from a chair using your arms (e.g., wheelchair or bedside chair)?: A Little Help needed to walk in hospital room?: A Little Help needed climbing 3-5 steps with a railing? : A Little 6 Click Score: 19    End of Session Equipment Utilized During Treatment: Gait belt Activity Tolerance: Patient tolerated treatment well Patient left: in chair;with call bell/phone within reach Nurse Communication: Mobility status PT Visit Diagnosis: Unsteadiness on feet (R26.81);Other abnormalities of gait and mobility (R26.89);Difficulty in walking, not elsewhere classified (R26.2)     Time: 2482-5003 PT Time Calculation (min) (ACUTE ONLY): 26 min  Charges:  $Gait Training: 8-22 mins $Therapeutic Activity: 8-22 mins                     Marisa Severin, PT, DPT Acute Rehabilitation Services Secure chat preferred Office [place] 06/19/2022, 12:51 PM

## 2022-06-19 NOTE — Progress Notes (Signed)
  Progress Note   Patient: Darren [name] GGY:[id number] DOB: 06-Dec-1940 DOA: 06/15/2022     3 DOS: the patient was seen and examined on 06/19/2022   Brief hospital course: 6 male PMH including COPD, asthma, diastolic CHF, atrial fibrillation presenting with shortness of breath and hypoxia.  Admitted for acute hypoxic respiratory failure, exacerbation, AKI, acute on chronic diastolic CHF. His presentation is more consistent with acute COPD exacerbation and complication of excess diuresis with acute renal failure then acute decompensated diastolic heart failure.  Improving, respiratory status stabilizing, likely home 12/12.  Assessment and Plan: Acute hypoxic respiratory failure secondary to COPD exacerbation, CHF exacerbation Clinically improved.  Continue bronchodilators, steroids. Now weaned off oxygen.  Hypoxia resolved.   AKI (acute kidney injury) ([place]) with hyperkalemia Renal function continues to improve.  Modest hyperkalemia.  Continue to hold Entresto and Lasix. Trend BMP.  Lokelma x 1. UOP 1450 K+ up to 5.3 BUN 71 > 64 Creatinine 2.81 > 2.38   COPD with acute exacerbation (HCC) Much better.  Wheezing nearly resolved.  Continue bronchodilators and steroids.  Doxycycline.   Acute on chronic diastolic (congestive) heart failure (HCC) Chest x-ray no acute disease.   Precipitated by poor diet and acute exacerbation of his COPD  Hold Entresto, diuretics. Sotalol discontined by Dr Einar Gip Will have to wait for renal function to improve to re-initiate Sotalol 40 mg BID and probably stop Entresto and start ARB when stable or as outpatient.   Paroxysmal atrial fibrillation (HCC) Stable.  Continue Eliquis at 5 mg p.o. daily Hold off on beta-blocker   ETOH abuse No signs of withdrawal noted.  CIWA   Obesity unspecified Encourage adequate PO intake Ensure Enlive po BID, each supplement provides 350 kcal and 20 grams of protein   Likely home tomorrow.     Subjective:  Feels  better Breathing better Walked today Off oxygen  Physical Exam: Vitals:   06/19/22 0533 06/19/22 0730 06/19/22 0757 06/19/22 1413  BP: (!) 164/91   (!) 171/92  Pulse: 60 72  68  Resp: 19   18  Temp: 98.1 F (36.7 C)   98.4 F (36.9 C)  TempSrc: Oral   Oral  SpO2: 98% 92% 94% 93%  Weight: 122.2 kg     Height:       Physical Exam Vitals reviewed.  Constitutional:      General: He is not in acute distress.    Appearance: He is not ill-appearing or toxic-appearing.  Cardiovascular:     Rate and Rhythm: Normal rate and regular rhythm.     Heart sounds: No murmur heard. Pulmonary:     Effort: Pulmonary effort is normal. No respiratory distress.     Breath sounds: No wheezing, rhonchi or rales.  Musculoskeletal:     Right lower leg: Edema present.     Left lower leg: Edema present.     Comments: 1+ BLE edem  Neurological:     Mental Status: He is alert.  Psychiatric:        Mood and Affect: Mood normal.        Behavior: Behavior normal.     Data Reviewed: K+ up to 5.3 BUN 71 > 64 Creatinien 2.81 > 2.38  Family Communication: wife at bedside  Disposition: Status is: Inpatient Remains inpatient appropriate because: AKI  Planned Discharge Destination: Home    Time spent: 20 minutes  Author: Murray Hodgkins, MD 06/19/2022 5:29 PM  For on call review [email or web address]. 12/12.

## 2022-06-19 NOTE — Progress Notes (Signed)
CSW received consult for substance use resources. CSW spoke with patient at bedside. CSW offered patient outpatient substance use treatment services resources. Patient politely declined. All questions answered. No further questions reported at this time.

## 2022-06-19 NOTE — Care Management Important Message (Signed)
Important Message  Patient Details  Name: CORTAVIUS MONTESINOS MRN: [id number] Date of Birth: September 04, 1940   Medicare Important Message Given:  Yes     Shelda Altes 06/19/2022, 10:40 AM

## 2022-06-20 ENCOUNTER — Other Ambulatory Visit: Payer: Self-pay | Admitting: Cardiology

## 2022-06-20 DIAGNOSIS — I480 Paroxysmal atrial fibrillation: Secondary | ICD-10-CM | POA: Diagnosis not present

## 2022-06-20 DIAGNOSIS — J441 Chronic obstructive pulmonary disease with (acute) exacerbation: Secondary | ICD-10-CM | POA: Diagnosis not present

## 2022-06-20 DIAGNOSIS — N179 Acute kidney failure, unspecified: Secondary | ICD-10-CM | POA: Diagnosis not present

## 2022-06-20 DIAGNOSIS — N170 Acute kidney failure with tubular necrosis: Secondary | ICD-10-CM

## 2022-06-20 DIAGNOSIS — I5032 Chronic diastolic (congestive) heart failure: Secondary | ICD-10-CM

## 2022-06-20 LAB — BASIC METABOLIC PANEL
Anion gap: 7 (ref 5–15)
BUN: 52 mg/dL — ABNORMAL HIGH (ref 8–23)
CO2: 23 mmol/L (ref 22–32)
Calcium: 8.4 mg/dL — ABNORMAL LOW (ref 8.9–10.3)
Chloride: 109 mmol/L (ref 98–111)
Creatinine, Ser: 2.06 mg/dL — ABNORMAL HIGH (ref 0.61–1.24)
GFR, Estimated: 32 mL/min — ABNORMAL LOW (ref >60)
Glucose, Bld: 133 mg/dL — ABNORMAL HIGH (ref 70–99)
Potassium: 4.1 mmol/L (ref 3.5–5.1)
Sodium: 139 mmol/L (ref 135–145)

## 2022-06-20 MED ORDER — ADULT MULTIVITAMIN W/MINERALS CH: 1.0000 | ORAL_TABLET | Freq: Every day | ORAL | Status: DC

## 2022-06-20 MED ORDER — VITAMIN B-1 100 MG PO TABS: 100.0000 mg | ORAL_TABLET | Freq: Every day | ORAL | Status: DC

## 2022-06-20 MED ORDER — DOXYCYCLINE HYCLATE 100 MG PO TABS: 100.0000 mg | ORAL_TABLET | Freq: Two times a day (BID) | ORAL | 0 refills | Status: DC

## 2022-06-20 MED ORDER — FOLIC ACID 1 MG PO TABS: 1.0000 mg | ORAL_TABLET | Freq: Every day | ORAL | Status: DC

## 2022-06-20 NOTE — Discharge Summary (Addendum)
Physician Discharge Summary   Patient: Darren [name] MRN: [id number] DOB: Jan 23, 1941  Admit date:     06/15/2022  Discharge date: 06/20/22  Discharge Physician: Murray Hodgkins   PCP: [name], MD   Recommendations at discharge:     AKI (acute kidney injury) [facility]) with hyperkalemia Renal function continues to improve.  Modest hyperkalemia. Continue to hold Entresto and Lasix on discharge, discussed with Dr. Einar Gip   Paroxysmal atrial fibrillation [facility]) Will have to wait for renal function to improve to re-initiate Sotalol 40 mg BID Consider decreasing dose of apixaban to 2.5 BUD   [facility] OT  Discharge Diagnoses: Principal Problem:   COPD with acute exacerbation ([place]) Active Problems:   Primary hypertension   Paroxysmal atrial fibrillation (HCC)   Acute on chronic diastolic (congestive) heart failure (HCC)   AKI (acute kidney injury) ([place])   ETOH abuse   Acute respiratory failure with hypoxia (HCC)   Acute renal failure with tubular necrosis (HCC)   Acute on chronic diastolic heart failure ([place])  Resolved Problems:   * No resolved hospital problems. *  Hospital Course: 58 male PMH including COPD, asthma, diastolic CHF, atrial fibrillation presenting with shortness of breath and hypoxia.  Admitted for acute hypoxic respiratory failure, exacerbation, AKI, acute on chronic diastolic CHF. His presentation ws more consistent with acute COPD exacerbation and complication of excess diuresis with acute renal failure then acute decompensated diastolic heart failure.  Gradually improved with treatment for COPD and IV fluids for AKI.  Acute hypoxic respiratory failure secondary to COPD exacerbation, CHF exacerbation Clinically improved.  Continue bronchodilators, steroids. Now weaned off oxygen.  Hypoxia resolved.   AKI (acute kidney injury) ([place]) with hyperkalemia Renal function continues to improve.  Modest hyperkalemia. Continue to hold Entresto and Lasix on discharge, discussed  with Dr. Einar Gip UOP 1700 K+ down to 4.1 BUN 71 > 64 > 52 Creatinine 2.81 > 2.38 > 2.06   COPD with acute exacerbation (HCC) Much better.  Wheezing nearly resolved.  Continue bronchodilators. FInished steroids.  Finish doxycycline.   Acute on chronic diastolic (congestive) heart failure (HCC) Chest x-ray no acute disease.   Precipitated by poor diet and acute exacerbation of his COPD  Hold Entresto, diuretic   Paroxysmal atrial fibrillation (HCC) Stable.  Continue Eliquis at 5 mg p.o. daily Resume beta-blocker Will have to wait for renal function to improve to re-initiate Sotalol 40 mg BID   ETOH abuse No signs of withdrawal noted.  CIWA   Obesity unspecified Encourage adequate PO intake Ensure Enlive po BID, each supplement provides 350 kcal and 20 grams of protein      Consultants:  Cardiology  Procedures performed:  None    Disposition: Home health Diet recommendation:  Cardiac diet DISCHARGE MEDICATION: Allergies as of 06/20/2022       Reactions   Amlodipine    Leg edema   Penicillins Hives   Has patient had a PCN reaction causing immediate rash, facial/tongue/throat swelling, SOB or lightheadedness with hypotension: No Has patient had a PCN reaction causing severe rash involving mucus membranes or skin necrosis: No Has patient had a PCN reaction that required hospitalization: No Has patient had a PCN reaction occurring within the last 10 years: No--childhood reaction If all of the above answers are "NO", then may proceed with Cephalosporin use.        Medication List     STOP taking these medications    Entresto 49-51 MG Generic drug: sacubitril-valsartan   furosemide 40 MG tablet  Commonly known as: LASIX   sotalol 80 MG tablet Commonly known as: BETAPACE       TAKE these medications    allopurinol 100 MG tablet Commonly known as: ZYLOPRIM Take 100 mg by mouth daily.   apixaban 5 MG Tabs tablet Commonly known as: Eliquis Take 1 tablet  (5 mg total) by mouth 2 (two) times daily.   atorvastatin 40 MG tablet Commonly known as: LIPITOR Take 40 mg by mouth daily.   colesevelam 625 MG tablet Commonly known as: WELCHOL Take 1,875 mg by mouth 2 (two) times daily with a meal.   doxycycline 100 MG tablet Commonly known as: VIBRA-TABS Take 1 tablet (100 mg total) by mouth every 12 (twelve) hours.   Dulera 100-5 MCG/ACT Aero Generic drug: mometasone-formoterol USE 2 INHALATIONS TWICE A DAY What changed: See the new instructions.   folic acid 1 MG tablet Commonly known as: FOLVITE Take 1 tablet (1 mg total) by mouth daily. Start taking on: June 21, 2022   gabapentin 100 MG capsule Commonly known as: NEURONTIN TAKE 1 CAPSULE BY MOUTH 3 TO 4 TIMES A DAY What changed: See the new instructions.   hydrALAZINE 50 MG tablet Commonly known as: APRESOLINE Take 1 tablet (50 mg total) by mouth 3 (three) times daily.   isosorbide mononitrate 120 MG 24 hr tablet Commonly known as: IMDUR Take 120 mg by mouth daily.   Magnesium 400 MG Tabs Take 400 mg by mouth daily at 12 noon.   Metanx 3-90.314-2-35 MG Caps Take 1 tablet by mouth 2 (two) times daily.   metoprolol tartrate 25 MG tablet Commonly known as: LOPRESSOR Take 1 tablet (25 mg total) by mouth 2 (two) times daily.   montelukast 10 MG tablet Commonly known as: SINGULAIR TAKE 1 TABLET BY MOUTH AT BEDTIME   multivitamin with minerals Tabs tablet Take 1 tablet by mouth daily. Start taking on: June 21, 2022   omeprazole 20 MG capsule Commonly known as: PRILOSEC Take 20 mg by mouth 2 (two) times daily before a meal.   SYSTANE OP Place 1 drop into both eyes 3 (three) times daily as needed (dry eyes).   thiamine 100 MG tablet Commonly known as: Vitamin B-1 Take 100 mg by mouth daily. What changed: Another medication with the same name was added. Make sure you understand how and when to take each.   thiamine 100 MG tablet Commonly known as: Vitamin  B-1 Take 1 tablet (100 mg total) by mouth daily. Start taking on: June 21, 2022 What changed: You were already taking a medication with the same name, and this prescription was added. Make sure you understand how and when to take each.   VITAMIN D-3 PO Take 2,000 Units by mouth daily.        Follow-up Information     [facility] Follow up on 06/28/2022.   Why: 06/28/22 '@11'$ :45 Contact information: [address]               Discharge Exam: Danley Danker Weights   06/18/22 0404 06/19/22 0533 06/20/22 0359  Weight: 123.7 kg 122.2 kg 121.9 kg   Physical Exam Vitals reviewed.  Cardiovascular:     Rate and Rhythm: Normal rate and regular rhythm.     Heart sounds: No murmur heard. Pulmonary:     Effort: Pulmonary effort is normal. No respiratory distress.     Breath sounds: No wheezing, rhonchi or rales.  Musculoskeletal:  Right lower leg: Edema present.     Left lower leg: Edema present.  Neurological:     Mental Status: He is alert.  Psychiatric:        Mood and Affect: Mood normal.        Behavior: Behavior normal.      Condition at discharge: good  The results of significant diagnostics from this hospitalization (including imaging, microbiology, ancillary and laboratory) are listed below for reference.   Imaging Studies: DG Chest Portable 1 View  Result Date: 06/15/2022 CLINICAL DATA:  Hypoxia and shortness of breath EXAM: PORTABLE CHEST 1 VIEW COMPARISON:  Chest x-ray 03/30/2016 FINDINGS: The heart size and mediastinal contours are within normal limits. Both lungs are clear. The visualized skeletal structures are unremarkable. IMPRESSION: No active disease. Electronically Signed   By: Ronney Asters M.D.   On: 06/15/2022 22:27   DG Chest 2 View  Result Date: 06/08/2022 CLINICAL DATA:  Short of breath.  Cough and congestion. EXAM: CHEST - 2 VIEW COMPARISON:  03/30/2016. FINDINGS: Cardiac  silhouette is normal in size and configuration. Normal mediastinal and hilar contours. Clear lungs.  No pleural effusion or pneumothorax. Skeletal structures are intact. IMPRESSION: No active cardiopulmonary disease. Electronically Signed   By: Lajean Manes M.D.   On: 06/08/2022 15:48    Microbiology: Results for orders placed or performed during the hospital encounter of 06/15/22  Resp Panel by RT-PCR (Flu A&B, Covid) Anterior Nasal Swab     Status: None   Collection Time: 06/15/22 10:31 PM   Specimen: Anterior Nasal Swab  Result Value Ref Range Status   SARS Coronavirus 2 by RT PCR NEGATIVE NEGATIVE Final    Comment: (NOTE) SARS-CoV-2 target nucleic acids are NOT DETECTED.  The SARS-CoV-2 RNA is generally detectable in upper respiratory specimens during the acute phase of infection. The lowest concentration of SARS-CoV-2 viral copies this assay can detect is 138 copies/mL. A negative result does not preclude SARS-Cov-2 infection and should not be used as the sole basis for treatment or other patient management decisions. A negative result may occur with  improper specimen collection/handling, submission of specimen other than nasopharyngeal swab, presence of viral mutation(s) within the areas targeted by this assay, and inadequate number of viral copies(<138 copies/mL). A negative result must be combined with clinical observations, patient history, and epidemiological information. The expected result is Negative.  Fact Sheet for Patients:  EntrepreneurPulse.com.au  Fact Sheet for Healthcare Providers:  IncredibleEmployment.be  This test is no t yet approved or cleared by the [place] FDA and  has been authorized for detection and/or diagnosis of SARS-CoV-2 by FDA under an Emergency Use Authorization (EUA). This EUA will remain  in effect (meaning this test can be used) for the duration of the COVID-19 declaration under Section 564(b)(1) of  the Act, 21 U.S.C.section 360bbb-3(b)(1), unless the authorization is terminated  or revoked sooner.       Influenza A by PCR NEGATIVE NEGATIVE Final   Influenza B by PCR NEGATIVE NEGATIVE Final    Comment: (NOTE) The Xpert Xpress SARS-CoV-2/FLU/RSV plus assay is intended as an aid in the diagnosis of influenza from Nasopharyngeal swab specimens and should not be used as a sole basis for treatment. Nasal washings and aspirates are unacceptable for Xpert Xpress SARS-CoV-2/FLU/RSV testing.  Fact Sheet for Patients: EntrepreneurPulse.com.au  Fact Sheet for Healthcare Providers: IncredibleEmployment.be  This test is not yet approved or cleared by the [place] FDA and has been authorized for detection and/or diagnosis of SARS-CoV-2  by FDA under an Emergency Use Authorization (EUA). This EUA will remain in effect (meaning this test can be used) for the duration of the COVID-19 declaration under Section 564(b)(1) of the Act, 21 U.S.C. section 360bbb-3(b)(1), unless the authorization is terminated or revoked.  Performed at [facility], [place] [address], [place], [place] [address]     Labs: CBC: Recent Labs  Lab 06/15/22 2217 06/16/22 0348 06/16/22 2031  WBC 7.8  --  7.6  NEUTROABS 6.1  --   --   HGB 12.9* 13.3 13.7  HCT 39.0 39.0 39.9  MCV 105.1*  --  103.9*  PLT 222  --  628   Basic Metabolic Panel: Recent Labs  Lab 06/16/22 0025 06/16/22 0348 06/16/22 2031 06/17/22 0117 06/17/22 1536 06/18/22 0127 06/19/22 0244 06/20/22 0133  NA  --    < > 134* 135  --  139 138 139  K  --    < > 5.6* 6.0* 5.1 4.7 5.3* 4.1  CL  --    < > 101 102  --  106 109 109  CO2  --    < > 23 24  --  24 21* 23  GLUCOSE  --    < > 189* 172*  --  172* 133* 133*  BUN  --    < > 66* 69*  --  71* 64* 52*  CREATININE  --    < > 3.61* 3.43*  --  2.81* 2.38* 2.06*  CALCIUM  --    < > 9.2 8.9  --  8.4* 8.1* 8.4*  MG 1.9  --   --   --   --   --   --    --   PHOS 6.0*  --   --   --   --   --   --   --    < > = values in this interval not displayed.   Liver Function Tests: Recent Labs  Lab 06/15/22 2217 06/16/22 2031  AST 18 22  ALT 15 14  ALKPHOS 92 88  BILITOT 0.3 0.5  PROT 6.4* 6.5  ALBUMIN 3.1* 3.3*   CBG: No results for input(s): "GLUCAP" in the last 168 hours.  Discharge time spent: less than 30 minutes.  Signed: Murray Hodgkins, MD [facility] 06/20/2022

## 2022-06-20 NOTE — Progress Notes (Signed)
Discharge instructions provided to patient. All medications, discharge instructions, and follow up appointments discussed. IV out. Monitor off. Discharging home with family.

## 2022-06-20 NOTE — Progress Notes (Signed)
     ICD-10-CM   1. Chronic diastolic heart failure (HCC)  I50.32 Brain natriuretic peptide    Brain natriuretic peptide    2. Acute renal failure with tubular necrosis (HCC)  N27.6 Basic metabolic panel    Basic metabolic panel     Orders Placed This Encounter  Procedures   Basic metabolic panel    Standing Status:   Future    Number of Occurrences:   1    Standing Expiration Date:   06/21/2023   Brain natriuretic peptide    Standing Status:   Future    Number of Occurrences:   1    Standing Expiration Date:   06/21/2023

## 2022-06-28 ENCOUNTER — Ambulatory Visit: Payer: Medicare Other | Admitting: Internal Medicine

## 2022-06-28 VITALS — BP 145/82 | HR 67 | Ht 71.0 in | Wt 263.8 lb

## 2022-06-28 DIAGNOSIS — I10 Essential (primary) hypertension: Secondary | ICD-10-CM

## 2022-06-28 DIAGNOSIS — I5032 Chronic diastolic (congestive) heart failure: Secondary | ICD-10-CM

## 2022-06-28 DIAGNOSIS — I480 Paroxysmal atrial fibrillation: Secondary | ICD-10-CM

## 2022-06-28 MED ORDER — LOSARTAN POTASSIUM 25 MG PO TABS: 25.0000 mg | ORAL_TABLET | Freq: Every day | ORAL | 3 refills | Status: DC

## 2022-06-28 NOTE — Progress Notes (Signed)
 Primary Physician/Referring:  Moreira, Roy, MD  Patient ID: Darren [name], male    DOB: 09/08/1940, 81 y.o.   MRN: [id number]  Chief Complaint  Patient presents with   Atrial Fibrillation   Follow-up   Hospitalization Follow-up        HPI:    Darren [name]  is a 81 y.o. male  with moderate obesity, paroxysmal atrial fibrillation and probably sinus node dysfunction with  sinus bradycardia even on minimal dose of beta-blocker, OSA on CPAP, hyperlipidemia, bronchial asthma and history of excessive alcohol intake.  Patient had developed severe LV systolic dysfunction, he was then started on sotalol on 12/18/2018 and echocardiogram in August 2020 following cardioversion revealed normalization of LVEF.  Patient presents for hospital follow-up. He was initially doing well with increased lasix dose but then he went to a [place] steakhouse and a [place] restaurant for his birthday and he ended up in the hospital with heart failure exacerbation. Patient has lost an additional 6 pounds since leaving the hospital. He is really watching his salt intake and has cut alcohol out entirely. Patient is feeling significantly better. Denies chest pain, shortness of breath, palpitations, diaphoresis, syncope, edema, PND, orthopnea.   Past Medical History:  Diagnosis Date   Arthritis    "maybe a little"   Asthma    "mild"   Atrial fibrillation (HCC)    Atypical mole 08/16/2011   left upper back tx exc atypical prol.   Atypical mole 12/18/2012   solar lentigo atypical tx w/s   bilateral leg edema 09/11/2018   Cancer (HCC)    prostate cancer   Dyspnea    Dyspnea on exertion 09/11/2018   Dysrhythmia    Afib with rapid ventricular   Essential hypertension 07/25/2018   Fatty liver    GERD (gastroesophageal reflux disease)    GI bleed 2018   Gout    History of blood transfusion 2018   Obstructive sleep apnea 09/11/2018   OSA (obstructive sleep apnea)    Paroxysmal atrial fibrillation (HCC)  09/11/2018   Pre-diabetes    Social History   Tobacco Use   Smoking status: Former    Packs/day: 1.00    Years: 25.00    Total pack years: 25.00    Types: Cigarettes    Start date: 1960    Quit date: 07/10/1988    Years since quitting: 33.9   Smokeless tobacco: Never  Substance Use Topics   Alcohol use: Yes    Alcohol/week: 25.0 standard drinks of alcohol    Types: 25 Shots of liquor per week    Comment: Daily    Marital Status: Married  ROS  Review of Systems  Cardiovascular:  Negative for chest pain, claudication, dyspnea on exertion, leg swelling, near-syncope, orthopnea, palpitations, paroxysmal nocturnal dyspnea and syncope.  Respiratory:  Positive for shortness of breath.   Neurological:  Negative for dizziness.    Objective  Blood pressure (!) 145/82, pulse 67, height 5' 11" (1.803 m), weight 263 lb 12.8 oz (119.7 kg), SpO2 97 %.     06/28/2022   11:18 AM 06/20/2022    8:16 AM 06/20/2022    3:59 AM  Vitals with BMI  Height 5' 11"    Weight 263 lbs 13 oz  268 lbs 11 oz  BMI 36.81  37.49  Systolic 145 160 179  Diastolic 82 93 100  Pulse 67 70 74     Physical Exam Vitals reviewed.  Constitutional:      Appearance: He   is well-developed. He is obese.  Neck:     Vascular: No JVD.  Cardiovascular:     Rate and Rhythm: Normal rate and regular rhythm.     Pulses:          Carotid pulses are 2+ on the right side and 2+ on the left side.      Dorsalis pedis pulses are 2+ on the right side and 2+ on the left side.       Posterior tibial pulses are 2+ on the right side and 2+ on the left side.     Heart sounds: Normal heart sounds. No murmur heard.    No gallop.  Pulmonary:     Effort: Pulmonary effort is normal.     Breath sounds: No wheezing.  Musculoskeletal:     Right lower leg: 1+ Edema present.     Left lower leg: 1+ Edema present.    Laboratory examination:   Recent Labs    06/18/22 0127 06/19/22 0244 06/20/22 0133  NA 139 138 139  K 4.7 5.3*  4.1  CL 106 109 109  CO2 24 21* 23  GLUCOSE 172* 133* 133*  BUN 71* 64* 52*  CREATININE 2.81* 2.38* 2.06*  CALCIUM 8.4* 8.1* 8.4*  GFRNONAA 22* 27* 32*   estimated creatinine clearance is 37.7 mL/min (A) (by C-G formula based on SCr of 2.06 mg/dL (H)).     Latest Ref Rng & Units 06/20/2022    1:33 AM 06/19/2022    2:44 AM 06/18/2022    1:27 AM  CMP  Glucose 70 - 99 mg/dL 133  133  172   BUN 8 - 23 mg/dL 52  64  71   Creatinine 0.61 - 1.24 mg/dL 2.06  2.38  2.81   Sodium 135 - 145 mmol/L 139  138  139   Potassium 3.5 - 5.1 mmol/L 4.1  5.3  4.7   Chloride 98 - 111 mmol/L 109  109  106   CO2 22 - 32 mmol/L 23  21  24   Calcium 8.9 - 10.3 mg/dL 8.4  8.1  8.4       Latest Ref Rng & Units 06/16/2022    8:31 PM 06/16/2022    3:48 AM 06/15/2022   10:17 PM  CBC  WBC 4.0 - 10.5 K/uL 7.6   7.8   Hemoglobin 13.0 - 17.0 g/dL 13.7  13.3  12.9   Hematocrit 39.0 - 52.0 % 39.9  39.0  39.0   Platelets 150 - 400 K/uL 240   222    Lipid Panel     Component Value Date/Time   CHOL 182 05/03/2021 1050   TRIG 125 05/03/2021 1050   HDL 96 05/03/2021 1050   LDLCALC 65 05/03/2021 1050   HEMOGLOBIN A1C No results found for: "HGBA1C", "MPG" TSH Recent Labs    06/16/22 0025  TSH 1.220    BNP    Component Value Date/Time   BNP 621.4 (H) 06/15/2022 2217    ProBNP    Component Value Date/Time   PROBNP 449 12/06/2021 1357    External labs:  03/11/2020: Total cholesterol 165, triglycerides 104, HDL 81, LDL 63. Hb 14.4/HCT 45.0, platelets 178, normal indicis. Sodium 141, potassium 104, BUN 16, creatinine 0.90, EGFR >60 mL.  A1c 6.1%. 09/10/2018: HB 15.8/HCT 46.3, mild macrocytosis present with MCV of 100.  Platelet count 154.  Potassium 4.9, BUN 24, creatinine 1.1, eGFR greater than 60 mL.  ALT and AST elevated at 64 and 57   respectively.  Total cholesterol 184, triglycerides 199, HDL 85, LDL 59.  07/12/2017: HB 15.6, HCT 47.8, platelets 155.  Normal indicis.  Potassium 4.8, serum  glucose 160 mg, BUN 23, creatinine 1.1.  CMP normal.  Total cholesterol 232, triglycerides 138, HDL 62, LDL 07/10/40.  Non-HDL cholesterol 170.  Uric acid is elevated at 7.8, vitamin D 25 x 1.  Allergies   Allergies  Allergen Reactions   Amlodipine     Leg edema   Penicillins Hives    Has patient had a PCN reaction causing immediate rash, facial/tongue/throat swelling, SOB or lightheadedness with hypotension: No Has patient had a PCN reaction causing severe rash involving mucus membranes or skin necrosis: No Has patient had a PCN reaction that required hospitalization: No Has patient had a PCN reaction occurring within the last 10 years: No--childhood reaction If all of the above answers are "NO", then may proceed with Cephalosporin use.    Medications Prior to Visit:   Outpatient Medications Prior to Visit  Medication Sig Dispense Refill   allopurinol (ZYLOPRIM) 100 MG tablet Take 100 mg by mouth daily.      apixaban (ELIQUIS) 5 MG TABS tablet Take 1 tablet (5 mg total) by mouth 2 (two) times daily. 60 tablet 11   atorvastatin (LIPITOR) 40 MG tablet Take 40 mg by mouth daily.      Cholecalciferol (VITAMIN D-3 PO) Take 2,000 Units by mouth daily.      colesevelam (WELCHOL) 625 MG tablet Take 1,875 mg by mouth 2 (two) times daily with a meal.      DULERA 100-5 MCG/ACT AERO USE 2 INHALATIONS TWICE A DAY (Patient taking differently: Inhale 2 puffs into the lungs in the morning and at bedtime.) 39 g 3   folic acid (FOLVITE) 1 MG tablet Take 1 tablet (1 mg total) by mouth daily.     hydrALAZINE (APRESOLINE) 50 MG tablet Take 1 tablet (50 mg total) by mouth 3 (three) times daily. 270 tablet 3   isosorbide mononitrate (IMDUR) 120 MG 24 hr tablet Take 120 mg by mouth daily.     L-Methylfolate-Algae-B12-B6 (METANX) 3-90.314-2-35 MG CAPS Take 1 tablet by mouth 2 (two) times daily.      Magnesium 400 MG TABS Take 400 mg by mouth daily at 12 noon.      metoprolol tartrate (LOPRESSOR) 25 MG tablet  Take 1 tablet (25 mg total) by mouth 2 (two) times daily. 180 tablet 3   montelukast (SINGULAIR) 10 MG tablet TAKE 1 TABLET BY MOUTH AT BEDTIME (Patient taking differently: Take 10 mg by mouth at bedtime.) 30 tablet 11   Multiple Vitamin (MULTIVITAMIN WITH MINERALS) TABS tablet Take 1 tablet by mouth daily.     omeprazole (PRILOSEC) 20 MG capsule Take 20 mg by mouth 2 (two) times daily before a meal.     Polyethyl Glycol-Propyl Glycol (SYSTANE OP) Place 1 drop into both eyes 3 (three) times daily as needed (dry eyes).     thiamine (VITAMIN B-1) 100 MG tablet Take 100 mg by mouth daily.     thiamine (VITAMIN B-1) 100 MG tablet Take 1 tablet (100 mg total) by mouth daily.     gabapentin (NEURONTIN) 100 MG capsule TAKE 1 CAPSULE BY MOUTH 3 TO 4 TIMES A DAY (Patient not taking: Reported on 06/28/2022) 120 capsule 2   doxycycline (VIBRA-TABS) 100 MG tablet Take 1 tablet (100 mg total) by mouth every 12 (twelve) hours. (Patient not taking: Reported on 06/28/2022) 4 tablet 0   No  facility-administered medications prior to visit.   Final Medications at End of Visit    Current Meds  Medication Sig   allopurinol (ZYLOPRIM) 100 MG tablet Take 100 mg by mouth daily.    apixaban (ELIQUIS) 5 MG TABS tablet Take 1 tablet (5 mg total) by mouth 2 (two) times daily.   atorvastatin (LIPITOR) 40 MG tablet Take 40 mg by mouth daily.    Cholecalciferol (VITAMIN D-3 PO) Take 2,000 Units by mouth daily.    colesevelam (WELCHOL) 625 MG tablet Take 1,875 mg by mouth 2 (two) times daily with a meal.    DULERA 100-5 MCG/ACT AERO USE 2 INHALATIONS TWICE A DAY (Patient taking differently: Inhale 2 puffs into the lungs in the morning and at bedtime.)   folic acid (FOLVITE) 1 MG tablet Take 1 tablet (1 mg total) by mouth daily.   hydrALAZINE (APRESOLINE) 50 MG tablet Take 1 tablet (50 mg total) by mouth 3 (three) times daily.   isosorbide mononitrate (IMDUR) 120 MG 24 hr tablet Take 120 mg by mouth daily.    L-Methylfolate-Algae-B12-B6 (METANX) 3-90.314-2-35 MG CAPS Take 1 tablet by mouth 2 (two) times daily.    losartan (COZAAR) 25 MG tablet Take 1 tablet (25 mg total) by mouth at bedtime.   Magnesium 400 MG TABS Take 400 mg by mouth daily at 12 noon.    metoprolol tartrate (LOPRESSOR) 25 MG tablet Take 1 tablet (25 mg total) by mouth 2 (two) times daily.   montelukast (SINGULAIR) 10 MG tablet TAKE 1 TABLET BY MOUTH AT BEDTIME (Patient taking differently: Take 10 mg by mouth at bedtime.)   Multiple Vitamin (MULTIVITAMIN WITH MINERALS) TABS tablet Take 1 tablet by mouth daily.   omeprazole (PRILOSEC) 20 MG capsule Take 20 mg by mouth 2 (two) times daily before a meal.   Polyethyl Glycol-Propyl Glycol (SYSTANE OP) Place 1 drop into both eyes 3 (three) times daily as needed (dry eyes).   thiamine (VITAMIN B-1) 100 MG tablet Take 100 mg by mouth daily.   thiamine (VITAMIN B-1) 100 MG tablet Take 1 tablet (100 mg total) by mouth daily.    Radiology:  No results found.  Cardiac Studies:   Lexiscan myoview stress test 09/20/2018:  1. Lexiscan stress test was performed. Exercise capacity was not assessed. Stress symptoms included dizziness. Resting blood pressure was 164/108 mmHg and peak effect blood pressure was 174/116 mmHg. The resting and stress electrocardiogram demonstrated atrial fibrillation with rapid ventricular rate, occasional PVC, and normal rest repolarization.  Stress EKG is non diagnostic for ischemia as it is a pharmacologic stress. In addition, no ischemic changes seen at heart rate of 136 bpm.  2. The overall quality of the study is good.  Left ventricular cavity is noted to be enlarged on the rest and stress studies.  Gated SPECT images reveal moderate global decrease in myocardial thickening and wall motion.  The left ventricular ejection fraction was calculated or visually estimated to be 34%.  REST and STRESS images demonstrate mildly decreased tracer uptake in the basal inferior, mid  inferior and apical inferior segments of the left ventricle, with no reversibility. Findings suggestive of dilated nonishcemic cardiomyopathy.  3. High risk study due to reduced LVED. Recommend clinical correlation.   PCV ECHOCARDIOGRAM COMPLETE 49/44/9675 Normal LV systolic function with visual EF 50-55%. Left ventricle cavity is normal in size. Mild left ventricular hypertrophy. Normal global wall motion. Doppler evidence of grade II  diastolic dysfunction, normal LAP. Left atrial cavity is moderately dilated. Mild (Grade  I) mitral regurgitation. Mild tricuspid regurgitation. Mild pulmonary hypertension. RVSP measures 49 mmHg. Moderate pulmonic regurgitation. IVC is dilated with a respiratory response of >50%. Compared to study 33/82/5053 normal diastolic filling pattern is now G2DD, mild LAE is now moderate, mild PR is now moderate, PHTN is new finding, otherwise no significant change.  EKG:   EKG 05/11/2022: Sinus bradycardia at rate of 59 bpm.  Left axis deviation.  Left anterior fascicular block.  IVCD.  Compared to previous EKG on 08/10/2021, no significant change.  06/07/2022: Sinus tachycardia, ventricular bigeminy pattern. Left axis with LAFB. -Diffuse nonspecific T-abnormality  06/28/2022: sinus rhythm. Left axis with LAFB. T wave abnormality in precordial leads unchanged from prior.  Assessment     ICD-10-CM   1. Paroxysmal atrial fibrillation (HCC)  I48.0 EKG 97-QBHA    Basic metabolic panel    2. Essential hypertension  L93 Basic metabolic panel    3. Chronic diastolic (congestive) heart failure (HCC)  X90.24 Basic metabolic panel       Meds ordered this encounter  Medications   losartan (COZAAR) 25 MG tablet    Sig: Take 1 tablet (25 mg total) by mouth at bedtime.    Dispense:  90 tablet    Refill:  3     Medications Discontinued During This Encounter  Medication Reason   doxycycline (VIBRA-TABS) 100 MG tablet Completed Course      This patients CHA2DS2-VASc  Score 3 (Age, HTN) and yearly risk of stroke 3.2%.   Recommendations:   VINT POLA  is a 81 y.o. male  with moderate obesity, paroxysmal atrial fibrillation and probably sinus node dysfunction with  sinus bradycardia even on minimal dose of beta-blocker, OSA on CPAP, hyperlipidemia, bronchial asthma and history of excessive alcohol intake.  Patient had developed severe LV systolic dysfunction, he was then started on sotalol on 12/18/2018 and echocardiogram in August 2020 following cardioversion revealed normalization of LVEF.  Chronic diastolic heart failure (HCC) S/P hospital admission for AECHF and AECOPD Lasix, Entresto, and Sotalol stopped during hospital admission due to AKI BMP ordered, will start low dose Losartan instead of Entresto Will restart sotalol if kidney function improved - continue holding lasix Patient weighing himself daily and is continuing to diurese without lasix and maintaining no salt diet If creatinine improved and patient retaining fluid, will restart lasix PRN   Essential hypertension Blood pressures well controlled.   He does bring a extensive list of home blood pressure readings which are also well controlled. Adding losartan 76m since entresto was discontinued Advised low-sodium diet less than 1500 mg daily.   Paroxysmal atrial fibrillation (HCC) Remains in sinus rhythm with no recurrence of A-fib.  Tolerating Eliquis without bleeding diathesis. Will restart sotalol if BMP improved   Follow up in 3 months or sooner if needed.      SFloydene Flock DO, [facility]12/20/2023, 12:38 PM Office: [phone]

## 2022-06-30 LAB — BASIC METABOLIC PANEL
BUN/Creatinine Ratio: 11 (ref 10–24)
BUN: 16 mg/dL (ref 8–27)
CO2: 23 mmol/L (ref 20–29)
Calcium: 8.6 mg/dL (ref 8.6–10.2)
Chloride: 103 mmol/L (ref 96–106)
Creatinine, Ser: 1.4 mg/dL — ABNORMAL HIGH (ref 0.76–1.27)
Glucose: 115 mg/dL — ABNORMAL HIGH (ref 70–99)
Potassium: 4.1 mmol/L (ref 3.5–5.2)
Sodium: 140 mmol/L (ref 134–144)
eGFR: 51 mL/min/{1.73_m2} — ABNORMAL LOW (ref >59)

## 2022-07-31 ENCOUNTER — Other Ambulatory Visit: Payer: Self-pay

## 2022-07-31 DIAGNOSIS — I480 Paroxysmal atrial fibrillation: Secondary | ICD-10-CM

## 2022-07-31 MED ORDER — APIXABAN 5 MG PO TABS: 5.0000 mg | ORAL_TABLET | Freq: Two times a day (BID) | ORAL | 3 refills | Status: DC

## 2022-09-01 ENCOUNTER — Ambulatory Visit (INDEPENDENT_AMBULATORY_CARE_PROVIDER_SITE_OTHER): Payer: Medicare Other | Admitting: Internal Medicine

## 2022-09-01 ENCOUNTER — Encounter: Payer: Self-pay | Admitting: Internal Medicine

## 2022-09-01 VITALS — BP 136/68 | HR 62 | Temp 98.2°F | Ht 71.0 in | Wt 234.8 lb

## 2022-09-01 DIAGNOSIS — J449 Chronic obstructive pulmonary disease, unspecified: Secondary | ICD-10-CM | POA: Diagnosis not present

## 2022-09-01 DIAGNOSIS — J45991 Cough variant asthma: Secondary | ICD-10-CM | POA: Diagnosis not present

## 2022-09-01 NOTE — Progress Notes (Unsigned)
Subjective:   Patient ID: Darren [name], male    DOB: April 10, 1941   MRN: [id number]    Brief patient profile:  63   yowm quit smoking 1990 with cough that resolved and pattern of recurrent rhinitis since childhood spring > fall with onset of recurrent flares of cough / wheezing around 2005 resolves from days to weeks p prednisone cycle needing up to 4 x on avg  referred to pulmonary clinic 03/30/2016 by Dr   Dr Mellody Drown    History of Present Illness  03/30/2016 1st [facility] office visit/ Darren [name]   Chief Complaint  Patient presents with   Pulmonary Consult    Referred by Dr. [name]. Pt c/o cough and wheezing off and on "for years".    last prednisone was 15 days one week prior to OV  Back to baseline = doe x MMRC1 = can walk nl pace, flat grade, can't hurry or go uphills or steps s sob  On proair once each am  rec Plan A = Automatic = Symbicort 80 Take 2 puffs first thing in am and then another 2 puffs about 12 hours later.  Plan B = Backup Only use your albuterol as a rescue medication       02/10/2021  f/u ov/Darren [name] re: AB on dulera 100 2bid and singulair  Chief Complaint  Patient presents with   Follow-up    F/u on asthma. Breathing well, using cpap while sleeping.   Dyspnea:  goes to gym 2-4 x per week s sob, not checking wts  Cough: some pnds/ clear mucus constant daytime throat clearing worse  Sleeping: on left side bed is flat with wedge pillow and cpap  tol fine s noct resp cc  SABA use: none 02: none Covid status:   vax x  3  Rec Gabapetin 100 mg one at bedtime for a week, then twice daily for a week then 3 x daily x for a week then 4 x daily    11/28/2021  f/u ov/Darren [name] re: AB  maint on dulera 100 / singulair gabapentin 100 mg three times daily   Chief Complaint  Patient presents with   Follow-up    SOB with extreme activity    Dyspnea:  still going to gym 3 x per week not much treadmill ex-  can walk to stop sign 100 yards sev times a week s sob.  Cough:  sense of pnds / globus sensation > clear mucus minimal  Sleeping: on cpap / lots of sneezing  SABA use: none  02: none  Rec Omeprazole 20 mg Take 30- 60 min before your first and last meals of the day  GERD diet  Your entresto is likely contributing to your cough - may need to consider alternative  In meantime you can increase the gabapentin to 4 x daily     03/01/2022  f/u ov/Darren [name] re: AB maint on dulera 200 2bid/ singulair   Chief Complaint  Patient presents with   Follow-up    Cough improved but still has post-nasal drip, no c/o with C-Pap  Dyspnea:  still going to gym 3 x week  - able to do treadmill up flat x  2.5-3 lpm limited more by feet legs tired before breathing gives out and sats in 90s Cough: assoc with sense of pnds p stirring min mucoid  Sleeping: cpap does fine s cough  SABA use: none  02: none  Covid status:   vax 2  / infected x  one Rec The sacubitrilat component of entresto is not and ACEi but it does lead to higher levels of bradykinin (the culprit in ACEi related cough) because it reduces Neprilysin based clearance of bradykinin.The typical symptoms are dry daytime cough (9% per PI) or complaints of a new sensation of globus or excess PNDS.  No change in medications for now as your heart is a more important part of your problem than the cough and sense of drainage.   Off entresto since Dec 2023 > cough improved    09/01/2022  f/u ov/Darren [name] re: AB   maint on dulera 100 /montelukast   Chief Complaint  Patient presents with   Follow-up    Weight loss helping GERD.  Occasional cough with clear mucus.   Dyspnea:  still going to gym - tolerance is improving but not really doing aerobics  Cough: better and not even using gabapentin anymore for cough suppression  Sleeping: cpap s cough 6 in wedge mattress  SABA use: none  02: none     No obvious day to day or daytime variability or assoc excess/ purulent sputum or mucus plugs or hemoptysis or cp or chest tightness,  subjective wheeze or overt sinus or hb symptoms.   Sleeping ok as above without nocturnal  or early am exacerbation  of respiratory  c/o's or need for noct saba. Also denies any obvious fluctuation of symptoms with weather or environmental changes or other aggravating or alleviating factors except as outlined above   No unusual exposure hx or h/o childhood pna/ asthma or knowledge of premature birth.  Current Allergies, Complete Past Medical History, Past Surgical History, Family History, and Social History were reviewed in [employer] record.  ROS  The following are not active complaints unless bolded Hoarseness, sore throat, dysphagia, dental problems, itching, sneezing,  nasal congestion or discharge of excess mucus or purulent secretions, ear ache,   fever, chills, sweats, unintended wt loss or wt gain, classically pleuritic or exertional cp,  orthopnea pnd or arm/hand swelling  or leg swelling, presyncope, palpitations, abdominal pain, anorexia, nausea, vomiting, diarrhea  or change in bowel habits or change in bladder habits, change in stools or change in urine, dysuria, hematuria,  rash, arthralgias, visual complaints, headache, numbness, weakness or ataxia or problems with walking or coordination,  change in mood or  memory.        Current Meds  Medication Sig   allopurinol (ZYLOPRIM) 100 MG tablet Take 100 mg by mouth daily.    apixaban (ELIQUIS) 5 MG TABS tablet Take 1 tablet (5 mg total) by mouth 2 (two) times daily.   atorvastatin (LIPITOR) 40 MG tablet Take 40 mg by mouth daily.    Cholecalciferol (VITAMIN D-3 PO) Take 2,000 Units by mouth daily.    colesevelam (WELCHOL) 625 MG tablet Take 1,875 mg by mouth 2 (two) times daily with a meal.    DULERA 100-5 MCG/ACT AERO USE 2 INHALATIONS TWICE A DAY (Patient taking differently: Inhale 2 puffs into the lungs in the morning and at bedtime.)   folic acid (FOLVITE) 1 MG tablet Take 1 tablet (1 mg total) by mouth  daily.   hydrALAZINE (APRESOLINE) 50 MG tablet Take 1 tablet (50 mg total) by mouth 3 (three) times daily.   isosorbide mononitrate (IMDUR) 120 MG 24 hr tablet Take 120 mg by mouth daily.   L-Methylfolate-Algae-B12-B6 (METANX) 3-90.314-2-35 MG CAPS Take 1 tablet by mouth 2 (two) times daily.    losartan (COZAAR) 25 MG tablet  Take 1 tablet (25 mg total) by mouth at bedtime.   Magnesium 400 MG TABS Take 400 mg by mouth daily at 12 noon.    metoprolol tartrate (LOPRESSOR) 25 MG tablet Take 1 tablet (25 mg total) by mouth 2 (two) times daily.   montelukast (SINGULAIR) 10 MG tablet TAKE 1 TABLET BY MOUTH AT BEDTIME (Patient taking differently: Take 10 mg by mouth at bedtime.)   Multiple Vitamin (MULTIVITAMIN WITH MINERALS) TABS tablet Take 1 tablet by mouth daily.   omeprazole (PRILOSEC) 20 MG capsule Take 20 mg by mouth 2 (two) times daily before a meal.   Polyethyl Glycol-Propyl Glycol (SYSTANE OP) Place 1 drop into both eyes 3 (three) times daily as needed (dry eyes).   thiamine (VITAMIN B-1) 100 MG tablet Take 100 mg by mouth daily.   thiamine (VITAMIN B-1) 100 MG tablet Take 1 tablet (100 mg total) by mouth daily.                 Objective:   Physical Exam  Wts  09/01/2022       234  03/01/2022       262  11/28/2021       268   02/10/2021         263  02/11/2020         262  07/24/2018       262  01/21/2018       265  07/17/2017          258  03/29/2017        241 01/09/2017          263  11/28/2016        267  09/06/16         273   06/07/16 267 lb (121.1 kg)  05/10/16 264 lb (119.7 kg)  03/30/16 261 lb (118.4 kg)     Vital signs reviewed  09/01/2022  - Note at rest 02 sats  95% on RA   General appearance:    amb obese wm, much more robust than prior ov's   HEENT : Oropharynx  clear      NECK :  without  apparent JVD/ palpable Nodes/TM    LUNGS: no acc muscle use,  Mild barrel  contour chest wall with bilateral  Distant bs s audible wheeze and  without cough on insp or exp  maneuvers  and mild  Hyperresonant  to  percussion bilaterally     CV:  slt irreg  no s3 or murmur or increase in P2, and trace bilateral LE pitting edema   ABD: obese   soft and nontender   MS:  Nl gait/ ext warm without deformities Or obvious joint restrictions  calf tenderness, cyanosis or clubbing     SKIN: warm and dry without lesions    NEURO:  alert, approp, nl sensorium with  no motor or cerebellar deficits apparent.    I personally reviewed images and agree with radiology impression as follows:  CXR:   06/15/22 pa and latera No active dz       Assessment & Plan:

## 2022-09-01 NOTE — Patient Instructions (Signed)
Keep up the good work  No change in your medications    Please schedule a follow up visit in 6 months but call sooner if needed

## 2022-09-01 NOTE — Assessment & Plan Note (Signed)
Quit smoking 1990 Spirometry 03/30/2016  FEV1 1.47 (45%)  Ratio 62   - PFT's  01/21/2019  FEV1 1.48 (46 % ) ratio 0.58  p 23 % improvement from saba p dulera prior to study with DLCO  27 (104%) and corrects to 5.21 (132%)  for alv volume and FV curve typical airflow obst    At this point cough is more of an issue than limited breathing so rec continue dulera 100 2bid unless limited by breathing (not hips) and then add spiriva 2.5  2 pffs each am

## 2022-09-01 NOTE — Assessment & Plan Note (Signed)
Onset around 2005  FENO 03/30/2016  =   47 on just saba  - 03/30/2016  After extensive coaching HFA effectiveness =    90% :  Try symbicort 80 2bid x 6 weeks then return with repeat spirometry >  04/06/2016 notified insurance prefers advair > try 115 hfa 2 bid   - Allergy profile 05/10/2016 >  Eos 0.4 /  IgE 60 Pos Grass/ cedar trees   - 05/10/2016 changed back to 4 weeks of symb 80 p flared on advair - FENO 06/07/2016  =   10 on symb 80 2bid  - Spirometry 06/07/2016  FEV1 1.89 (58%)  Ratio  71   - 09/06/2016   try dulera 200 2bid - 11/28/2016 excess shaking on duelra 200 2bid so try one bid and off gerd rx since is not convinced worked - Sinus CT 11/29/2016 >>> Mucosal edema in the maxillary and ethmoid sinuses bilaterally. No air-fluid level. - Singulair 10 mg started first week in Sept 2018   - 07/17/2017   rechallenge with the dulera 100  - 07/24/2018  After extensive coaching inhaler device,  effectiveness =    90% but some "congestion" since started labetolol  - 02/10/2021 trial of gabapentin titrate up to 100 mg qid for incessant daytime throat clearing-  - still clearing throat on tid gabpentin so rec 100 qid, max gerd rx and consider trial off entresto   >>>   06/2022 stopped entresto > cough improved as of 09/01/2022 and chf no worse - Although even in retrospect it may not be clear the this contributed to the pt's symptoms,  Pt improved off  and adding  back at this point or in the future would risk confusion in interpretation of non-specific respiratory symptoms to which this patient is prone  ie  Better not to muddy the waters here.   F/u  6 m,call sooner if needed         Each maintenance medication was reviewed in detail including emphasizing most importantly the difference between maintenance and prns and under what circumstances the prns are to be triggered using an action plan format where appropriate.  Total time for H and P, chart review, counseling, reviewing hfa device(s) and  generating customized AVS unique to this office visit / same day charting = 25 min

## 2022-09-04 ENCOUNTER — Other Ambulatory Visit: Payer: Self-pay | Admitting: Cardiology

## 2022-09-04 DIAGNOSIS — I5042 Chronic combined systolic (congestive) and diastolic (congestive) heart failure: Secondary | ICD-10-CM

## 2022-09-11 ENCOUNTER — Ambulatory Visit (INDEPENDENT_AMBULATORY_CARE_PROVIDER_SITE_OTHER): Payer: Medicare Other | Admitting: Pulmonary Disease

## 2022-09-11 ENCOUNTER — Ambulatory Visit: Payer: Medicare Other | Admitting: Pulmonary Disease

## 2022-09-11 ENCOUNTER — Encounter: Payer: Self-pay | Admitting: Pulmonary Disease

## 2022-09-11 VITALS — BP 112/62 | HR 61 | Ht 71.0 in | Wt 238.0 lb

## 2022-09-11 DIAGNOSIS — G4733 Obstructive sleep apnea (adult) (pediatric): Secondary | ICD-10-CM | POA: Diagnosis not present

## 2022-09-11 NOTE — Progress Notes (Signed)
Darren [name]    [id number]    11-20-1940  Primary Care Physician:Moreira, Carloyn Manner, MD  Referring Physician: [name], MD [id number] [place],  [place] [address]  Chief complaint:   Patient being seen for obstructive sleep apnea Doing well with CPAP  HPI:  On auto titrating CPAP Feeling well  Uses a CPAP nightly  Wakes up feeling like he is at a good nights rest  Was hospitalized in December for heart failure  It was at a pressure of 13 previously, now using an auto titrating CPAP  Usually goes to bed between 9 and 10 PM Gets about 8 to 9 hours of sleep Wakes up a couple of times during the night  Usually cannot sleep without his CPAP  He has hypertension, atrial fibrillation-  Has a history of prostate cancer, history of asthma, prediabetes   Outpatient Encounter Medications as of 09/11/2022  Medication Sig   allopurinol (ZYLOPRIM) 100 MG tablet Take 100 mg by mouth daily.    atorvastatin (LIPITOR) 40 MG tablet Take 40 mg by mouth daily.    Cholecalciferol (VITAMIN D-3 PO) Take 2,000 Units by mouth daily.    colesevelam (WELCHOL) 625 MG tablet Take 1,875 mg by mouth 2 (two) times daily with a meal.    DULERA 100-5 MCG/ACT AERO USE 2 INHALATIONS TWICE A DAY (Patient taking differently: Inhale 2 puffs into the lungs in the morning and at bedtime.)   folic acid (FOLVITE) 1 MG tablet Take 1 tablet (1 mg total) by mouth daily.   hydrALAZINE (APRESOLINE) 50 MG tablet TAKE 1 TABLET BY MOUTH 3 TIMES DAILY.   isosorbide mononitrate (IMDUR) 120 MG 24 hr tablet Take 120 mg by mouth daily.   L-Methylfolate-Algae-B12-B6 (METANX) 3-90.314-2-35 MG CAPS Take 1 tablet by mouth 2 (two) times daily.    losartan (COZAAR) 25 MG tablet Take 1 tablet (25 mg total) by mouth at bedtime.   Magnesium 400 MG TABS Take 400 mg by mouth daily at 12 noon.    metoprolol tartrate (LOPRESSOR) 25 MG tablet Take 1 tablet (25 mg total) by mouth 2 (two) times daily.   Multiple Vitamin  (MULTIVITAMIN WITH MINERALS) TABS tablet Take 1 tablet by mouth daily.   omeprazole (PRILOSEC) 20 MG capsule Take 20 mg by mouth 2 (two) times daily before a meal.   Polyethyl Glycol-Propyl Glycol (SYSTANE OP) Place 1 drop into both eyes 3 (three) times daily as needed (dry eyes).   thiamine (VITAMIN B-1) 100 MG tablet Take 100 mg by mouth daily.   montelukast (SINGULAIR) 10 MG tablet TAKE 1 TABLET BY MOUTH AT BEDTIME (Patient not taking: Reported on 09/11/2022)   [DISCONTINUED] apixaban (ELIQUIS) 5 MG TABS tablet Take 1 tablet (5 mg total) by mouth 2 (two) times daily.   [DISCONTINUED] thiamine (VITAMIN B-1) 100 MG tablet Take 1 tablet (100 mg total) by mouth daily.   No facility-administered encounter medications on file as of 09/11/2022.    Allergies as of 09/11/2022 - Review Complete 09/11/2022  Allergen Reaction Noted   Amlodipine  10/17/2018   Penicillins Hives 03/30/2016    Past Medical History:  Diagnosis Date   Arthritis    "maybe a little"   Asthma    "mild"   Atrial fibrillation ([place])    Atypical mole 08/16/2011   left upper back tx exc atypical prol.   Atypical mole 12/18/2012   solar lentigo atypical tx w/s   bilateral leg edema 09/11/2018  Cancer [facility])    prostate cancer   Dyspnea    Dyspnea on exertion 09/11/2018   Dysrhythmia    Afib with rapid ventricular   Essential hypertension 07/25/2018   Fatty liver    GERD (gastroesophageal reflux disease)    GI bleed 2018   Gout    History of blood transfusion 2018   Obstructive sleep apnea 09/11/2018   OSA (obstructive sleep apnea)    Paroxysmal atrial fibrillation ([place]) 09/11/2018   Pre-diabetes     Past Surgical History:  Procedure Laterality Date   CARDIOVERSION N/A 10/08/2018   Procedure: CARDIOVERSION;  Surgeon: [name], MD;  Location[facility] ENDOSCOPY;  Service: Cardiovascular;  Laterality: N/A;   CARDIOVERSION N/A 01/17/2019   Procedure: CARDIOVERSION;  Surgeon: [name], MD;  Location: [place];   Service: Cardiovascular;  Laterality: N/A;   COLONOSCOPY Left 03/10/2017   Procedure: COLONOSCOPY;  Surgeon: Arta Silence, MD;  Location: [facility] ENDOSCOPY;  Service: Endoscopy;  Laterality: Left;   ESOPHAGOGASTRODUODENOSCOPY (EGD) WITH PROPOFOL Left 03/09/2017   Procedure: ESOPHAGOGASTRODUODENOSCOPY (EGD) WITH PROPOFOL;  Surgeon: Ronnette Juniper, MD;  Location: [place];  Service: Gastroenterology;  Laterality: Left;   EYE SURGERY Bilateral    cataract    FINGER SURGERY Right    5th   Prostate     DaVinci    Family History  Problem Relation Age of Onset   Emphysema Father        smoked    Social History   Socioeconomic History   Marital status: Married    Spouse name: Not on file   Number of children: 4   Years of education: Not on file   Highest education level: Not on file  Occupational History   Not on file  Tobacco Use   Smoking status: Former    Packs/day: 1.00    Years: 25.00    Total pack years: 25.00    Types: Cigarettes    Start date: 30    Quit date: 07/10/1988    Years since quitting: 34.1   Smokeless tobacco: Never  Vaping Use   Vaping Use: Never used  Substance and Sexual Activity   Alcohol use: Yes    Alcohol/week: 25.0 standard drinks of alcohol    Types: 25 Shots of liquor per week    Comment: Daily    Drug use: No   Sexual activity: Not on file  Other Topics Concern   Not on file  Social History Narrative   Not on file   Social Determinants of Health   Financial Resource Strain: Not on file  Food Insecurity: Not on file  Transportation Needs: Not on file  Physical Activity: Not on file  Stress: Not on file  Social Connections: Not on file  Intimate Partner Violence: Not on file    Review of Systems  HENT: Negative.    Eyes: Negative.   Respiratory:  Positive for apnea, cough and shortness of breath.   Cardiovascular:  Positive for leg swelling.  Gastrointestinal: Negative.   Endocrine: Negative.   Genitourinary: Negative.    Psychiatric/Behavioral:  Positive for sleep disturbance.   All other systems reviewed and are negative.   Vitals:   09/11/22 1529  BP: 112/62  Pulse: 61  SpO2: 96%     Physical Exam Constitutional:      Appearance: He is well-developed.  HENT:     Head: Normocephalic and atraumatic.  Eyes:     General:        Right eye: No discharge.  Left eye: No discharge.     Conjunctiva/sclera: Conjunctivae normal.  Neck:     Thyroid: No thyromegaly.     Trachea: No tracheal deviation.  Cardiovascular:     Rate and Rhythm: Normal rate.  Pulmonary:     Effort: Pulmonary effort is normal. No respiratory distress.     Breath sounds: No wheezing or rales.  Abdominal:     General: Bowel sounds are normal.     Palpations: Abdomen is soft.     Tenderness: There is no abdominal tenderness. There is no rebound.  Musculoskeletal:     Cervical back: Normal range of motion and neck supple.   Data Reviewed: Compliance data reveals 100% compliance Residual AHI 1.3  Assessment:  History of obstructive sleep apnea -Compliant with CPAP use -Currently on auto titrating CPAP -Tolerating CPAP well -Cannot sleep without CPAP use  Class I obesity -Continue to work on weight loss  Atrial fibrillation -Continue current management   Plan/Recommendations: Continue CPAP nightly  No changes to CPAP therapy  Follow-up a year from now  Encouraged to call with significant concerns  Sherrilyn Rist MD [facility] 09/11/2022, 3:44 PM  CC: [name], MD

## 2022-09-11 NOTE — Patient Instructions (Signed)
I will see you back in about 1 year  Continue using your CPAP on a nightly basis  Call us with significant concerns  Your compliance data shows the CPAP is working very well

## 2022-09-26 ENCOUNTER — Ambulatory Visit: Payer: Medicare Other | Admitting: Internal Medicine

## 2022-09-26 ENCOUNTER — Encounter: Payer: Self-pay | Admitting: Internal Medicine

## 2022-09-26 VITALS — BP 131/63 | HR 56 | Resp 18 | Ht 71.0 in | Wt 237.0 lb

## 2022-09-26 DIAGNOSIS — I480 Paroxysmal atrial fibrillation: Secondary | ICD-10-CM

## 2022-09-26 DIAGNOSIS — I10 Essential (primary) hypertension: Secondary | ICD-10-CM

## 2022-09-26 DIAGNOSIS — I5032 Chronic diastolic (congestive) heart failure: Secondary | ICD-10-CM

## 2022-09-26 NOTE — Progress Notes (Signed)
Primary Physician/Referring:  [name], MD  Patient ID: Darren [name], male    DOB: 1941-01-24, 82 y.o.   MRN: [id number]  Chief Complaint  Patient presents with  . Atrial Fibrillation  . Hypertension  . Congestive Heart Failure  . Follow-up    3 months   HPI:    Darren [name]  is a 82 y.o. male  with moderate obesity, paroxysmal atrial fibrillation and probably sinus node dysfunction with  sinus bradycardia even on minimal dose of beta-blocker, OSA on CPAP, hyperlipidemia, bronchial asthma and history of excessive alcohol intake.  Patient had developed severe LV systolic dysfunction, he was then started on sotalol on 12/18/2018 and echocardiogram in August 2020 following cardioversion revealed normalization of LVEF.  Patient presents for hospital follow-up. He was initially doing well with increased lasix dose but then he went to a [place] steakhouse and a [place] for his birthday and he ended up in the hospital with heart failure exacerbation. Patient has lost an additional 6 pounds since leaving the hospital. He is really watching his salt intake and has cut alcohol out entirely. Patient is feeling significantly better. Denies chest pain, shortness of breath, palpitations, diaphoresis, syncope, edema, PND, orthopnea.   Past Medical History:  Diagnosis Date  . Arthritis    "maybe a little"  . Asthma    "mild"  . Atrial fibrillation ([place])   . Atypical mole 08/16/2011   left upper back tx exc atypical prol.  . Atypical mole 12/18/2012   solar lentigo atypical tx w/s  . bilateral leg edema 09/11/2018  . Cancer [facility])    prostate cancer  . Dyspnea   . Dyspnea on exertion 09/11/2018  . Dysrhythmia    Afib with rapid ventricular  . Essential hypertension 07/25/2018  . Fatty liver   . GERD (gastroesophageal reflux disease)   . GI bleed 2018  . Gout   . History of blood transfusion 2018  . Obstructive sleep apnea 09/11/2018  . OSA (obstructive sleep apnea)    . Paroxysmal atrial fibrillation ([place]) 09/11/2018  . Pre-diabetes    Social History   Tobacco Use  . Smoking status: Former    Packs/day: 1.00    Years: 25.00    Additional pack years: 0.00    Total pack years: 25.00    Types: Cigarettes    Start date: 79    Quit date: 07/10/1988    Years since quitting: 34.2  . Smokeless tobacco: Never  Substance Use Topics  . Alcohol use: Yes    Alcohol/week: 25.0 standard drinks of alcohol    Types: 25 Shots of liquor per week    Comment: Daily    Marital Status: Married  ROS  Review of Systems  Cardiovascular:  Negative for chest pain, claudication, dyspnea on exertion, leg swelling, near-syncope, orthopnea, palpitations, paroxysmal nocturnal dyspnea and syncope.  Respiratory:  Positive for shortness of breath.   Neurological:  Negative for dizziness.    Objective  Blood pressure 131/63, pulse (!) 56, resp. rate 18, height 5\' 11"  (1.803 m), weight 237 lb (107.5 kg), SpO2 98 %.     09/26/2022    9:29 AM 09/11/2022    3:29 PM 09/01/2022   10:39 AM  Vitals with BMI  Height 5\' 11"  5\' 11"  5\' 11"   Weight 237 lbs 238 lbs 234 lbs 13 oz  BMI 33.07 [id number]  Systolic [id number]  Diastolic 63 62 68  Pulse 56 61 62  Physical Exam Vitals reviewed.  Constitutional:      Appearance: He is well-developed. He is obese.  Neck:     Vascular: No JVD.  Cardiovascular:     Rate and Rhythm: Normal rate and regular rhythm.     Pulses:          Carotid pulses are 2+ on the right side and 2+ on the left side.      Dorsalis pedis pulses are 2+ on the right side and 2+ on the left side.       Posterior tibial pulses are 2+ on the right side and 2+ on the left side.     Heart sounds: Normal heart sounds. No murmur heard.    No gallop.  Pulmonary:     Effort: Pulmonary effort is normal.     Breath sounds: No wheezing.  Musculoskeletal:     Right lower leg: 1+ Edema present.     Left lower leg: 1+ Edema present.   Laboratory examination:    Recent Labs    06/18/22 0127 06/19/22 0244 06/20/22 0133 06/29/22 1039  NA 139 138 139 140  K 4.7 5.3* 4.1 4.1  CL 106 109 109 103  CO2 24 21* 23 23  GLUCOSE 172* 133* 133* 115*  BUN 71* 64* 52* 16  CREATININE 2.81* 2.38* 2.06* 1.40*  CALCIUM 8.4* 8.1* 8.4* 8.6  GFRNONAA 22* 27* 32*  --     CrCl cannot be calculated (Patient's most recent lab result is older than the maximum 21 days allowed.).     Latest Ref Rng & Units 06/29/2022   10:39 AM 06/20/2022    1:33 AM 06/19/2022    2:44 AM  CMP  Glucose 70 - 99 mg/dL 115  133  133   BUN 8 - 27 mg/dL 16  52  64   Creatinine 0.76 - 1.27 mg/dL 1.40  2.06  2.38   Sodium 134 - 144 mmol/L 140  139  138   Potassium 3.5 - 5.2 mmol/L 4.1  4.1  5.3   Chloride 96 - 106 mmol/L 103  109  109   CO2 20 - 29 mmol/L 23  23  21    Calcium 8.6 - 10.2 mg/dL 8.6  8.4  8.1       Latest Ref Rng & Units 06/16/2022    8:31 PM 06/16/2022    3:48 AM 06/15/2022   10:17 PM  CBC  WBC 4.0 - 10.5 K/uL 7.6   7.8   Hemoglobin 13.0 - 17.0 g/dL 13.7  13.3  12.9   Hematocrit 39.0 - 52.0 % 39.9  39.0  39.0   Platelets 150 - 400 K/uL 240   222    Lipid Panel     Component Value Date/Time   CHOL 182 05/03/2021 1050   TRIG 125 05/03/2021 1050   HDL 96 05/03/2021 1050   LDLCALC 65 05/03/2021 1050   HEMOGLOBIN A1C No results found for: "HGBA1C", "MPG" TSH Recent Labs    06/16/22 0025  TSH 1.220     BNP    Component Value Date/Time   BNP 621.4 (H) 06/15/2022 2217    ProBNP    Component Value Date/Time   PROBNP 449 12/06/2021 1357    External labs:  03/11/2020: Total cholesterol 165, triglycerides 104, HDL 81, LDL 63. Hb 14.4/HCT 45.0, platelets 178, normal indicis. Sodium 141, potassium 104, BUN 16, creatinine 0.90, EGFR >60 mL.  A1c 6.1%. 09/10/2018: HB 15.8/HCT 46.3, mild macrocytosis present with MCV of  100.  Platelet count 154.  Potassium 4.9, BUN 24, creatinine 1.1, eGFR greater than 60 mL.  ALT and AST elevated at 64 and 57  respectively.  Total cholesterol 184, triglycerides 199, HDL 85, LDL 59.  07/12/2017: HB 15.6, HCT 47.8, platelets 155.  Normal indicis.  Potassium 4.8, serum glucose 160 mg, BUN 23, creatinine 1.1.  CMP normal.  Total cholesterol 232, triglycerides 138, HDL 62, LDL 07/10/40.  Non-HDL cholesterol 170.  Uric acid is elevated at 7.8, vitamin D 25 x 1.  Allergies   Allergies  Allergen Reactions  . Amlodipine     Leg edema  . Penicillins Hives    Has patient had a PCN reaction causing immediate rash, facial/tongue/throat swelling, SOB or lightheadedness with hypotension: No Has patient had a PCN reaction causing severe rash involving mucus membranes or skin necrosis: No Has patient had a PCN reaction that required hospitalization: No Has patient had a PCN reaction occurring within the last 10 years: No--childhood reaction If all of the above answers are "NO", then may proceed with Cephalosporin use.    Medications Prior to Visit:   Outpatient Medications Prior to Visit  Medication Sig Dispense Refill  . allopurinol (ZYLOPRIM) 100 MG tablet Take 100 mg by mouth daily.     [name] apixaban (ELIQUIS) 5 MG TABS tablet Take 5 mg by mouth 2 (two) times daily.    [name] atorvastatin (LIPITOR) 40 MG tablet Take 40 mg by mouth daily.     . Cholecalciferol (VITAMIN D-3 PO) Take 2,000 Units by mouth daily.     . colesevelam (WELCHOL) 625 MG tablet Take 1,875 mg by mouth 2 (two) times daily with a meal.     . DULERA 100-5 MCG/ACT AERO USE 2 INHALATIONS TWICE A DAY (Patient taking differently: Inhale 2 puffs into the lungs in the morning and at bedtime.) 39 g 3  . folic acid (FOLVITE) 1 MG tablet Take 1 tablet (1 mg total) by mouth daily.    . hydrALAZINE (APRESOLINE) 50 MG tablet TAKE 1 TABLET BY MOUTH 3 TIMES DAILY. 270 tablet 0  . isosorbide mononitrate (IMDUR) 120 MG 24 hr tablet Take 120 mg by mouth daily.    [name] L-Methylfolate-Algae-B12-B6 (METANX) 3-90.314-2-35 MG CAPS Take 1 tablet by mouth 2 (two) times  daily.     [name] losartan (COZAAR) 25 MG tablet Take 1 tablet (25 mg total) by mouth at bedtime. 90 tablet 3  . Magnesium 400 MG TABS Take 400 mg by mouth daily at 12 noon.     . metoprolol tartrate (LOPRESSOR) 25 MG tablet Take 1 tablet (25 mg total) by mouth 2 (two) times daily. 180 tablet 3  . montelukast (SINGULAIR) 10 MG tablet TAKE 1 TABLET BY MOUTH AT BEDTIME 30 tablet 11  . Multiple Vitamin (MULTIVITAMIN WITH MINERALS) TABS tablet Take 1 tablet by mouth daily.    [name] omeprazole (PRILOSEC) 20 MG capsule Take 20 mg by mouth 2 (two) times daily before a meal.    . Polyethyl Glycol-Propyl Glycol (SYSTANE OP) Place 1 drop into both eyes 3 (three) times daily as needed (dry eyes).    . thiamine (VITAMIN B-1) 100 MG tablet Take 100 mg by mouth daily.     No facility-administered medications prior to visit.   Final Medications at End of Visit    Current Meds  Medication Sig  . allopurinol (ZYLOPRIM) 100 MG tablet Take 100 mg by mouth daily.   [name] apixaban (ELIQUIS) 5 MG TABS tablet Take 5 mg  by mouth 2 (two) times daily.  [name] atorvastatin (LIPITOR) 40 MG tablet Take 40 mg by mouth daily.   . Cholecalciferol (VITAMIN D-3 PO) Take 2,000 Units by mouth daily.   . colesevelam (WELCHOL) 625 MG tablet Take 1,875 mg by mouth 2 (two) times daily with a meal.   . DULERA 100-5 MCG/ACT AERO USE 2 INHALATIONS TWICE A DAY (Patient taking differently: Inhale 2 puffs into the lungs in the morning and at bedtime.)  . folic acid (FOLVITE) 1 MG tablet Take 1 tablet (1 mg total) by mouth daily.  . hydrALAZINE (APRESOLINE) 50 MG tablet TAKE 1 TABLET BY MOUTH 3 TIMES DAILY.  . isosorbide mononitrate (IMDUR) 120 MG 24 hr tablet Take 120 mg by mouth daily.  [name] L-Methylfolate-Algae-B12-B6 (METANX) 3-90.314-2-35 MG CAPS Take 1 tablet by mouth 2 (two) times daily.   [name] losartan (COZAAR) 25 MG tablet Take 1 tablet (25 mg total) by mouth at bedtime.  . Magnesium 400 MG TABS Take 400 mg by mouth daily at 12 noon.   . metoprolol  tartrate (LOPRESSOR) 25 MG tablet Take 1 tablet (25 mg total) by mouth 2 (two) times daily.  . montelukast (SINGULAIR) 10 MG tablet TAKE 1 TABLET BY MOUTH AT BEDTIME  . Multiple Vitamin (MULTIVITAMIN WITH MINERALS) TABS tablet Take 1 tablet by mouth daily.  [name] omeprazole (PRILOSEC) 20 MG capsule Take 20 mg by mouth 2 (two) times daily before a meal.  . Polyethyl Glycol-Propyl Glycol (SYSTANE OP) Place 1 drop into both eyes 3 (three) times daily as needed (dry eyes).  . thiamine (VITAMIN B-1) 100 MG tablet Take 100 mg by mouth daily.    Radiology:  No results found.  Cardiac Studies:   Lexiscan myoview stress test 09/20/2018:  1. Lexiscan stress test was performed. Exercise capacity was not assessed. Stress symptoms included dizziness. Resting blood pressure was 164/108 mmHg and peak effect blood pressure was 174/116 mmHg. The resting and stress electrocardiogram demonstrated atrial fibrillation with rapid ventricular rate, occasional PVC, and normal rest repolarization.  Stress EKG is non diagnostic for ischemia as it is a pharmacologic stress. In addition, no ischemic changes seen at heart rate of 136 bpm.  2. The overall quality of the study is good.  Left ventricular cavity is noted to be enlarged on the rest and stress studies.  Gated SPECT images reveal moderate global decrease in myocardial thickening and wall motion.  The left ventricular ejection fraction was calculated or visually estimated to be 34%.  REST and STRESS images demonstrate mildly decreased tracer uptake in the basal inferior, mid inferior and apical inferior segments of the left ventricle, with no reversibility. Findings suggestive of dilated nonishcemic cardiomyopathy.  3. High risk study due to reduced LVED. Recommend clinical correlation.   PCV ECHOCARDIOGRAM COMPLETE [id number] Normal LV systolic function with visual EF 50-55%. Left ventricle cavity is normal in size. Mild left ventricular hypertrophy. Normal global wall  motion. Doppler evidence of grade II  diastolic dysfunction, normal LAP. Left atrial cavity is moderately dilated. Mild (Grade I) mitral regurgitation. Mild tricuspid regurgitation. Mild pulmonary hypertension. RVSP measures 49 mmHg. Moderate pulmonic regurgitation. IVC is dilated with a respiratory response of >50%. Compared to study [id number] normal diastolic filling pattern is now G2DD, mild LAE is now moderate, mild PR is now moderate, PHTN is new finding, otherwise no significant change.  EKG:   EKG 05/11/2022: Sinus bradycardia at rate of 59 bpm.  Left axis deviation.  Left anterior fascicular block.  IVCD.  Compared  to previous EKG on 08/10/2021, no significant change.  06/07/2022: Sinus tachycardia, ventricular bigeminy pattern. Left axis with LAFB. -Diffuse nonspecific T-abnormality  06/28/2022: sinus rhythm. Left axis with LAFB. T wave abnormality in precordial leads unchanged from prior.  Assessment     ICD-10-CM   1. Paroxysmal atrial fibrillation (HCC)  I48.0 EKG 12-Lead       No orders of the defined types were placed in this encounter.    There are no discontinued medications.     This patients CHA2DS2-VASc Score 3 (Age, HTN) and yearly risk of stroke 3.2%.   Recommendations:   Darren [name]  is a 82 y.o. male  with moderate obesity, paroxysmal atrial fibrillation and probably sinus node dysfunction with  sinus bradycardia even on minimal dose of beta-blocker, OSA on CPAP, hyperlipidemia, bronchial asthma and history of excessive alcohol intake.  Patient had developed severe LV systolic dysfunction, he was then started on sotalol on 12/18/2018 and echocardiogram in August 2020 following cardioversion revealed normalization of LVEF.  Chronic diastolic heart failure (HCC) S/P hospital admission for AECHF and AECOPD Lasix, Entresto, and Sotalol stopped during hospital admission due to AKI BMP ordered, will start low dose Losartan instead of Entresto Will restart  sotalol if kidney function improved - continue holding lasix Patient weighing himself daily and is continuing to diurese without lasix and maintaining no salt diet If creatinine improved and patient retaining fluid, will restart lasix PRN   Essential hypertension Blood pressures well controlled.   He does bring a extensive list of home blood pressure readings which are also well controlled. Adding losartan 25mg  since entresto was discontinued Advised low-sodium diet less than 1500 mg daily.   Paroxysmal atrial fibrillation (HCC) Remains in sinus rhythm with no recurrence of A-fib.  Tolerating Eliquis without bleeding diathesis. Will restart sotalol if BMP improved   Follow up in 3 months or sooner if needed.      Floydene Flock, DO, [facility] 09/26/2022, 9:52 AM Office: [phone]

## 2022-09-27 ENCOUNTER — Ambulatory Visit: Payer: Medicare Other | Admitting: Internal Medicine

## 2022-11-09 ENCOUNTER — Ambulatory Visit: Payer: Medicare Other | Admitting: Internal Medicine

## 2022-11-09 ENCOUNTER — Ambulatory Visit: Payer: Medicare Other

## 2022-11-09 VITALS — BP 139/73 | HR 62 | Ht 71.0 in | Wt 243.0 lb

## 2022-11-09 DIAGNOSIS — I480 Paroxysmal atrial fibrillation: Secondary | ICD-10-CM

## 2022-11-09 DIAGNOSIS — I10 Essential (primary) hypertension: Secondary | ICD-10-CM

## 2022-11-09 DIAGNOSIS — I5032 Chronic diastolic (congestive) heart failure: Secondary | ICD-10-CM

## 2022-11-09 NOTE — Progress Notes (Signed)
Primary Physician/Referring:  Ralene Ok, MD  Patient ID: Darren [name], male    DOB: 1940-11-06, 82 y.o.   MRN: [id number]  Chief Complaint  Patient presents with   Atrial Fibrillation   Follow-up   HPI:    Darren [name]  is a 82 y.o. male  with moderate obesity, paroxysmal atrial fibrillation and probably sinus node dysfunction with  sinus bradycardia even on minimal dose of beta-blocker, OSA on CPAP, hyperlipidemia, bronchial asthma and history of excessive alcohol intake.  Patient had developed severe LV systolic dysfunction, he was then started on sotalol on 12/18/2018 and echocardiogram in August 2020 following cardioversion revealed normalization of LVEF.  Patient presents for follow-up visit.  He has been doing great since I saw him last. He keeps a log of his weight, BP, and HR daily and brings it to every office visit. He continues to lose weight and he feels much better, he is trying to get down to 200 pounds. He is following a cardiac diet and not drinking alcohol. Denies chest pain, shortness of breath, palpitations, diaphoresis, syncope, edema, PND, orthopnea.   Past Medical History:  Diagnosis Date   Arthritis    "maybe a little"   Asthma    "mild"   Atrial fibrillation (HCC)    Atypical mole 08/16/2011   left upper back tx exc atypical prol.   Atypical mole 12/18/2012   solar lentigo atypical tx w/s   bilateral leg edema 09/11/2018   Cancer (HCC)    prostate cancer   Dyspnea    Dyspnea on exertion 09/11/2018   Dysrhythmia    Afib with rapid ventricular   Essential hypertension 07/25/2018   Fatty liver    GERD (gastroesophageal reflux disease)    GI bleed 2018   Gout    History of blood transfusion 2018   Obstructive sleep apnea 09/11/2018   OSA (obstructive sleep apnea)    Paroxysmal atrial fibrillation (HCC) 09/11/2018   Pre-diabetes    Social History   Tobacco Use   Smoking status: Former    Packs/day: 1.00    Years: 25.00    Additional pack  years: 0.00    Total pack years: 25.00    Types: Cigarettes    Start date: 71    Quit date: 07/10/1988    Years since quitting: 34.3   Smokeless tobacco: Never  Substance Use Topics   Alcohol use: Yes    Alcohol/week: 25.0 standard drinks of alcohol    Types: 25 Shots of liquor per week    Comment: Daily    Marital Status: Married  ROS  Review of Systems  Cardiovascular:  Negative for chest pain, claudication, dyspnea on exertion, leg swelling, near-syncope, orthopnea, palpitations, paroxysmal nocturnal dyspnea and syncope.  Respiratory:  Negative for shortness of breath.   Neurological:  Negative for dizziness.    Objective  Blood pressure 139/73, pulse 62, height 5\' 11"  (1.803 m), weight 243 lb (110.2 kg), SpO2 96 %.     11/09/2022   10:58 AM 09/26/2022    9:29 AM 09/11/2022    3:29 PM  Vitals with BMI  Height 5\' 11"  5\' 11"  5\' 11"   Weight 243 lbs 237 lbs 238 lbs  BMI 33.91 33.07 33.21  Systolic 139 131 086  Diastolic 73 63 62  Pulse 62 56 61     Physical Exam Vitals reviewed.  Constitutional:      Appearance: He is well-developed. He is obese.  Neck:     Vascular:  No JVD.  Cardiovascular:     Rate and Rhythm: Normal rate and regular rhythm.     Pulses:          Carotid pulses are 2+ on the right side and 2+ on the left side.      Dorsalis pedis pulses are 2+ on the right side and 2+ on the left side.       Posterior tibial pulses are 2+ on the right side and 2+ on the left side.     Heart sounds: Normal heart sounds. No murmur heard.    No gallop.  Pulmonary:     Effort: Pulmonary effort is normal.     Breath sounds: No wheezing.  Musculoskeletal:     Right lower leg: No edema.     Left lower leg: No edema.    Laboratory examination:   Recent Labs    06/18/22 0127 06/19/22 0244 06/20/22 0133 06/29/22 1039  NA 139 138 139 140  K 4.7 5.3* 4.1 4.1  CL 106 109 109 103  CO2 24 21* 23 23  GLUCOSE 172* 133* 133* 115*  BUN 71* 64* 52* 16  CREATININE 2.81*  2.38* 2.06* 1.40*  CALCIUM 8.4* 8.1* 8.4* 8.6  GFRNONAA 22* 27* 32*  --    CrCl cannot be calculated (Patient's most recent lab result is older than the maximum 21 days allowed.).     Latest Ref Rng & Units 06/29/2022   10:39 AM 06/20/2022    1:33 AM 06/19/2022    2:44 AM  CMP  Glucose 70 - 99 mg/dL 782  956  213   BUN 8 - 27 mg/dL 16  52  64   Creatinine 0.76 - 1.27 mg/dL 0.86  5.78  4.69   Sodium 134 - 144 mmol/L 140  139  138   Potassium 3.5 - 5.2 mmol/L 4.1  4.1  5.3   Chloride 96 - 106 mmol/L 103  109  109   CO2 20 - 29 mmol/L 23  23  21    Calcium 8.6 - 10.2 mg/dL 8.6  8.4  8.1       Latest Ref Rng & Units 06/16/2022    8:31 PM 06/16/2022    3:48 AM 06/15/2022   10:17 PM  CBC  WBC 4.0 - 10.5 K/uL 7.6   7.8   Hemoglobin 13.0 - 17.0 g/dL 62.9  52.8  41.3   Hematocrit 39.0 - 52.0 % 39.9  39.0  39.0   Platelets 150 - 400 K/uL 240   222    Lipid Panel     Component Value Date/Time   CHOL 182 05/03/2021 1050   TRIG 125 05/03/2021 1050   HDL 96 05/03/2021 1050   LDLCALC 65 05/03/2021 1050   HEMOGLOBIN A1C No results found for: "HGBA1C", "MPG" TSH Recent Labs    06/16/22 0025  TSH 1.220    BNP    Component Value Date/Time   BNP 621.4 (H) 06/15/2022 2217    ProBNP    Component Value Date/Time   PROBNP 449 12/06/2021 1357    External labs:  03/11/2020: Total cholesterol 165, triglycerides 104, HDL 81, LDL 63. Hb 14.4/HCT 45.0, platelets 178, normal indicis. Sodium 141, potassium 104, BUN 16, creatinine 0.90, EGFR >60 mL.  A1c 6.1%. 09/10/2018: HB 15.8/HCT 46.3, mild macrocytosis present with MCV of 100.  Platelet count 154.  Potassium 4.9, BUN 24, creatinine 1.1, eGFR greater than 60 mL.  ALT and AST elevated at 64 and 57 respectively.  Total cholesterol 184, triglycerides 199, HDL 85, LDL 59.  07/12/2017: HB 15.6, HCT 47.8, platelets 155.  Normal indicis.  Potassium 4.8, serum glucose 160 mg, BUN 23, creatinine 1.1.  CMP normal.  Total cholesterol 232,  triglycerides 138, HDL 62, LDL 07/10/40.  Non-HDL cholesterol 170.  Uric acid is elevated at 7.8, vitamin D 25 x 1.  Allergies   Allergies  Allergen Reactions   Amlodipine     Leg edema   Penicillins Hives    Has patient had a PCN reaction causing immediate rash, facial/tongue/throat swelling, SOB or lightheadedness with hypotension: No Has patient had a PCN reaction causing severe rash involving mucus membranes or skin necrosis: No Has patient had a PCN reaction that required hospitalization: No Has patient had a PCN reaction occurring within the last 10 years: No--childhood reaction If all of the above answers are "NO", then may proceed with Cephalosporin use.    Medications Prior to Visit:   Outpatient Medications Prior to Visit  Medication Sig Dispense Refill   allopurinol (ZYLOPRIM) 100 MG tablet Take 100 mg by mouth daily.      apixaban (ELIQUIS) 5 MG TABS tablet Take 5 mg by mouth 2 (two) times daily.     atorvastatin (LIPITOR) 40 MG tablet Take 40 mg by mouth daily.      Cholecalciferol (VITAMIN D-3 PO) Take 2,000 Units by mouth daily.      colesevelam (WELCHOL) 625 MG tablet Take 1,875 mg by mouth 2 (two) times daily with a meal.      folic acid (FOLVITE) 1 MG tablet Take 1 tablet (1 mg total) by mouth daily.     furosemide (LASIX) 40 MG tablet Take 40 mg by mouth.     hydrALAZINE (APRESOLINE) 50 MG tablet TAKE 1 TABLET BY MOUTH 3 TIMES DAILY. (Patient taking differently: Take 50 mg by mouth in the morning and at bedtime.) 270 tablet 0   isosorbide mononitrate (IMDUR) 120 MG 24 hr tablet Take 120 mg by mouth daily.     L-Methylfolate-Algae-B12-B6 (METANX) 3-90.314-2-35 MG CAPS Take 1 tablet by mouth 2 (two) times daily.      losartan (COZAAR) 25 MG tablet Take 1 tablet (25 mg total) by mouth at bedtime. 90 tablet 3   Magnesium 400 MG TABS Take 400 mg by mouth daily at 12 noon.      metoprolol tartrate (LOPRESSOR) 25 MG tablet Take 1 tablet (25 mg total) by mouth 2 (two) times  daily. 180 tablet 3   Multiple Vitamin (MULTIVITAMIN WITH MINERALS) TABS tablet Take 1 tablet by mouth daily.     omeprazole (PRILOSEC) 20 MG capsule Take 20 mg by mouth 2 (two) times daily before a meal.     Polyethyl Glycol-Propyl Glycol (SYSTANE OP) Place 1 drop into both eyes 3 (three) times daily as needed (dry eyes).     thiamine (VITAMIN B-1) 100 MG tablet Take 100 mg by mouth daily.     DULERA 100-5 MCG/ACT AERO USE 2 INHALATIONS TWICE A DAY (Patient taking differently: Inhale 2 puffs into the lungs in the morning and at bedtime.) 39 g 3   montelukast (SINGULAIR) 10 MG tablet TAKE 1 TABLET BY MOUTH AT BEDTIME (Patient not taking: Reported on 11/09/2022) 30 tablet 11   No facility-administered medications prior to visit.   Final Medications at End of Visit    Current Meds  Medication Sig   allopurinol (ZYLOPRIM) 100 MG tablet Take 100 mg by mouth daily.    apixaban (ELIQUIS) 5  MG TABS tablet Take 5 mg by mouth 2 (two) times daily.   atorvastatin (LIPITOR) 40 MG tablet Take 40 mg by mouth daily.    Cholecalciferol (VITAMIN D-3 PO) Take 2,000 Units by mouth daily.    colesevelam (WELCHOL) 625 MG tablet Take 1,875 mg by mouth 2 (two) times daily with a meal.    folic acid (FOLVITE) 1 MG tablet Take 1 tablet (1 mg total) by mouth daily.   furosemide (LASIX) 40 MG tablet Take 40 mg by mouth.   hydrALAZINE (APRESOLINE) 50 MG tablet TAKE 1 TABLET BY MOUTH 3 TIMES DAILY. (Patient taking differently: Take 50 mg by mouth in the morning and at bedtime.)   isosorbide mononitrate (IMDUR) 120 MG 24 hr tablet Take 120 mg by mouth daily.   L-Methylfolate-Algae-B12-B6 (METANX) 3-90.314-2-35 MG CAPS Take 1 tablet by mouth 2 (two) times daily.    losartan (COZAAR) 25 MG tablet Take 1 tablet (25 mg total) by mouth at bedtime.   Magnesium 400 MG TABS Take 400 mg by mouth daily at 12 noon.    metoprolol tartrate (LOPRESSOR) 25 MG tablet Take 1 tablet (25 mg total) by mouth 2 (two) times daily.   Multiple  Vitamin (MULTIVITAMIN WITH MINERALS) TABS tablet Take 1 tablet by mouth daily.   omeprazole (PRILOSEC) 20 MG capsule Take 20 mg by mouth 2 (two) times daily before a meal.   Polyethyl Glycol-Propyl Glycol (SYSTANE OP) Place 1 drop into both eyes 3 (three) times daily as needed (dry eyes).   thiamine (VITAMIN B-1) 100 MG tablet Take 100 mg by mouth daily.    Radiology:  No results found.  Cardiac Studies:   Lexiscan myoview stress test 09/20/2018:  1. Lexiscan stress test was performed. Exercise capacity was not assessed. Stress symptoms included dizziness. Resting blood pressure was 164/108 mmHg and peak effect blood pressure was 174/116 mmHg. The resting and stress electrocardiogram demonstrated atrial fibrillation with rapid ventricular rate, occasional PVC, and normal rest repolarization.  Stress EKG is non diagnostic for ischemia as it is a pharmacologic stress. In addition, no ischemic changes seen at heart rate of 136 bpm.  2. The overall quality of the study is good.  Left ventricular cavity is noted to be enlarged on the rest and stress studies.  Gated SPECT images reveal moderate global decrease in myocardial thickening and wall motion.  The left ventricular ejection fraction was calculated or visually estimated to be 34%.  REST and STRESS images demonstrate mildly decreased tracer uptake in the basal inferior, mid inferior and apical inferior segments of the left ventricle, with no reversibility. Findings suggestive of dilated nonishcemic cardiomyopathy.  3. High risk study due to reduced LVED. Recommend clinical correlation.   PCV ECHOCARDIOGRAM COMPLETE 04/26/2021 Normal LV systolic function with visual EF 50-55%. Left ventricle cavity is normal in size. Mild left ventricular hypertrophy. Normal global wall motion. Doppler evidence of grade II  diastolic dysfunction, normal LAP. Left atrial cavity is moderately dilated. Mild (Grade I) mitral regurgitation. Mild tricuspid regurgitation.  Mild pulmonary hypertension. RVSP measures 49 mmHg. Moderate pulmonic regurgitation. IVC is dilated with a respiratory response of >50%. Compared to study 02/11/2019 normal diastolic filling pattern is now G2DD, mild LAE is now moderate, mild PR is now moderate, PHTN is new finding, otherwise no significant change.  EKG:   EKG 05/11/2022: Sinus bradycardia at rate of 59 bpm.  Left axis deviation.  Left anterior fascicular block.  IVCD.  Compared to previous EKG on 08/10/2021, no significant change.  06/07/2022: Sinus tachycardia, ventricular bigeminy pattern. Left axis with LAFB. -Diffuse nonspecific T-abnormality  06/28/2022: sinus rhythm. Left axis with LAFB. T wave abnormality in precordial leads unchanged from prior.  Assessment     ICD-10-CM   1. Paroxysmal atrial fibrillation (HCC)  I48.0 EKG 12-Lead    2. Essential hypertension  I10     3. Chronic diastolic (congestive) heart failure (HCC)  I50.32        No orders of the defined types were placed in this encounter.    Medications Discontinued During This Encounter  Medication Reason   montelukast (SINGULAIR) 10 MG tablet Completed Course       This patients CHA2DS2-VASc Score 3 (Age, HTN) and yearly risk of stroke 3.2%.   Recommendations:   Darren [name]  is a 82 y.o. male  with moderate obesity, paroxysmal atrial fibrillation and probably sinus node dysfunction with  sinus bradycardia even on minimal dose of beta-blocker, OSA on CPAP, hyperlipidemia, bronchial asthma and history of excessive alcohol intake.  Patient had developed severe LV systolic dysfunction, he was then started on sotalol on 12/18/2018 and echocardiogram in August 2020 following cardioversion revealed normalization of LVEF.  Chronic diastolic heart failure (HCC) Patient weighing himself daily and is continuing on Lasix daily No exacerbation of heart failure He is working hard to lose weight and follow a healthy diet   Essential  hypertension Blood pressures well controlled.   He does bring a extensive list of home blood pressure readings which are also well controlled. Advised low-sodium diet less than 1500 mg daily.   Paroxysmal atrial fibrillation (HCC) Remains in sinus rhythm with no recurrence of A-fib.  Tolerating Eliquis without bleeding diathesis.   Follow up in 6 months or sooner if needed.      Clotilde Dieter, DO, [facility] 11/10/2022, 9:12 AM Office: [phone]

## 2023-01-16 ENCOUNTER — Other Ambulatory Visit: Payer: Self-pay | Admitting: Internal Medicine

## 2023-01-23 DIAGNOSIS — Z7901 Long term (current) use of anticoagulants: Secondary | ICD-10-CM | POA: Insufficient documentation

## 2023-01-31 ENCOUNTER — Encounter: Payer: Self-pay | Admitting: Cardiology

## 2023-03-05 ENCOUNTER — Ambulatory Visit: Payer: Medicare Other | Admitting: Internal Medicine

## 2023-03-08 ENCOUNTER — Other Ambulatory Visit: Payer: Self-pay | Admitting: Otolaryngology

## 2023-03-13 ENCOUNTER — Ambulatory Visit: Payer: Medicare Other | Admitting: Internal Medicine

## 2023-03-13 NOTE — Progress Notes (Signed)
Surgical Instructions   Your procedure is scheduled on Wednesday March 21, 2023. Report to [facility] Main Entrance "A" at 6:30 A.M., then check in with the Admitting office. Any questions or running late day of surgery: call [phone]  Questions prior to your surgery date: call [phone], Monday-Friday, 8am-4pm. If you experience any cold or flu symptoms such as cough, fever, chills, shortness of breath, etc. between now and your scheduled surgery, please notify us at the above number.     Remember:  Do not eat after midnight the night before your surgery   You may drink clear liquids until 5:30 the morning of your surgery.   Clear liquids allowed are: Water, Non-Citrus Juices (without pulp), Carbonated Beverages, Clear Tea, Black Coffee Only (NO MILK, CREAM OR POWDERED CREAMER of any kind), and Gatorade.    Take these medicines the morning of surgery with A SIP OF WATER  allopurinol (ZYLOPRIM)  atorvastatin (LIPITOR)  colesevelam (WELCHOL)  DULERA  hydrALAZINE (APRESOLINE)  isosorbide mononitrate (IMDUR)  metoprolol tartrate (LOPRESSOR)  omeprazole (PRILOSEC)    HOLD YOUR apixaban (ELIQUIS) THREE DAYS PRIOR TO YOUR SURGERY, WITH THE LAST DOSE BEING 03/17/2023.   One week prior to surgery, STOP taking any Aspirin (unless otherwise instructed by your surgeon) Aleve, Naproxen, Ibuprofen, Motrin, Advil, Goody's, BC's, all herbal medications, fish oil, and non-prescription vitamins.                     Do NOT Smoke (Tobacco/Vaping) for 24 hours prior to your procedure.  If you use a CPAP at night, you may bring your mask/headgear for your overnight stay.   You will be asked to remove any contacts, glasses, piercing's, hearing aid's, dentures/partials prior to surgery. Please bring cases for these items if needed.    Patients discharged the day of surgery will not be allowed to drive home, and someone needs to stay with them for 24 hours.  SURGICAL WAITING ROOM  VISITATION Patients may have no more than 2 support people in the waiting area - these visitors may rotate.   Pre-op nurse will coordinate an appropriate time for 1 ADULT support person, who may not rotate, to accompany patient in pre-op.  Children under the age of 93 must have an adult with them who is not the patient and must remain in the main waiting area with an adult.  If the patient needs to stay at the hospital during part of their recovery, the visitor guidelines for inpatient rooms apply.  Please refer to the [facility] website for the visitor guidelines for any additional information.   If you received a COVID test during your pre-op visit  it is requested that you wear a mask when out in public, stay away from anyone that may not be feeling well and notify your surgeon if you develop symptoms. If you have been in contact with anyone that has tested positive in the last 10 days please notify you surgeon.      Pre-operative CHG Bathing Instructions   You can play a key role in reducing the risk of infection after surgery. Your skin needs to be as free of germs as possible. You can reduce the number of germs on your skin by washing with CHG (chlorhexidine gluconate) soap before surgery. CHG is an antiseptic soap that kills germs and continues to kill germs even after washing.   DO NOT use if you have an allergy to chlorhexidine/CHG or antibacterial soaps. If your skin becomes reddened  or irritated, stop using the CHG and notify one of our RNs at [phone].              TAKE A SHOWER THE NIGHT BEFORE SURGERY AND THE DAY OF SURGERY    Please keep in mind the following:  DO NOT shave, including legs and underarms, 48 hours prior to surgery.   You may shave your face before/day of surgery.  Place clean sheets on your bed the night before surgery Use a clean washcloth (not used since being washed) for each shower. DO NOT sleep with pet's night before surgery.  CHG Shower  Instructions:  If you choose to wash your hair and private area, wash first with your normal shampoo/soap.  After you use shampoo/soap, rinse your hair and body thoroughly to remove shampoo/soap residue.  Turn the water OFF and apply half the bottle of CHG soap to a CLEAN washcloth.  Apply CHG soap ONLY FROM YOUR NECK DOWN TO YOUR TOES (washing for 3-5 minutes)  DO NOT use CHG soap on face, private areas, open wounds, or sores.  Pay special attention to the area where your surgery is being performed.  If you are having back surgery, having someone wash your back for you may be helpful. Wait 2 minutes after CHG soap is applied, then you may rinse off the CHG soap.  Pat dry with a clean towel  Put on clean pajamas    Additional instructions for the day of surgery: DO NOT APPLY any lotions, deodorants or cologne.   Do not wear jewelry Do not bring valuables to the hospital. [facility] is not responsible for valuables/personal belongings. Put on clean/comfortable clothes.  Please brush your teeth.  Ask your nurse before applying any prescription medications to the skin.

## 2023-03-14 ENCOUNTER — Other Ambulatory Visit: Payer: Self-pay

## 2023-03-14 ENCOUNTER — Other Ambulatory Visit: Payer: Self-pay | Admitting: Internal Medicine

## 2023-03-14 ENCOUNTER — Encounter (HOSPITAL_COMMUNITY)
Admission: RE | Admit: 2023-03-14 | Discharge: 2023-03-14 | Disposition: A | Discharge status: Home / Self Care | Payer: Medicare Other | Source: Ambulatory Visit | Attending: Otolaryngology | Admitting: Otolaryngology

## 2023-03-14 ENCOUNTER — Encounter (HOSPITAL_COMMUNITY): Payer: Self-pay

## 2023-03-14 ENCOUNTER — Other Ambulatory Visit: Payer: Self-pay | Admitting: Otolaryngology

## 2023-03-14 VITALS — BP 122/63 | HR 58 | Temp 98.5°F | Resp 18 | Ht 71.0 in | Wt 248.2 lb

## 2023-03-14 DIAGNOSIS — Z01818 Encounter for other preprocedural examination: Secondary | ICD-10-CM | POA: Diagnosis present

## 2023-03-14 DIAGNOSIS — Z01812 Encounter for preprocedural laboratory examination: Secondary | ICD-10-CM | POA: Insufficient documentation

## 2023-03-14 DIAGNOSIS — Z87891 Personal history of nicotine dependence: Secondary | ICD-10-CM | POA: Diagnosis not present

## 2023-03-14 DIAGNOSIS — I480 Paroxysmal atrial fibrillation: Secondary | ICD-10-CM | POA: Diagnosis not present

## 2023-03-14 DIAGNOSIS — K219 Gastro-esophageal reflux disease without esophagitis: Secondary | ICD-10-CM | POA: Diagnosis not present

## 2023-03-14 DIAGNOSIS — I5032 Chronic diastolic (congestive) heart failure: Secondary | ICD-10-CM | POA: Diagnosis not present

## 2023-03-14 HISTORY — DX: Heart failure, unspecified: I50.9

## 2023-03-14 LAB — COMPREHENSIVE METABOLIC PANEL
ALT: 14 U/L (ref 0–44)
AST: 20 U/L (ref 15–41)
Albumin: 2.8 g/dL — ABNORMAL LOW (ref 3.5–5.0)
Alkaline Phosphatase: 89 U/L (ref 38–126)
Anion gap: 7 (ref 5–15)
BUN: 27 mg/dL — ABNORMAL HIGH (ref 8–23)
CO2: 24 mmol/L (ref 22–32)
Calcium: 8.7 mg/dL — ABNORMAL LOW (ref 8.9–10.3)
Chloride: 104 mmol/L (ref 98–111)
Creatinine, Ser: 1.3 mg/dL — ABNORMAL HIGH (ref 0.61–1.24)
GFR, Estimated: 56 mL/min — ABNORMAL LOW (ref >60)
Glucose, Bld: 112 mg/dL — ABNORMAL HIGH (ref 70–99)
Potassium: 4.6 mmol/L (ref 3.5–5.1)
Sodium: 135 mmol/L (ref 135–145)
Total Bilirubin: 0.5 mg/dL (ref 0.3–1.2)
Total Protein: 6.2 g/dL — ABNORMAL LOW (ref 6.5–8.1)

## 2023-03-14 LAB — CBC
HCT: 34.5 % — ABNORMAL LOW (ref 39.0–52.0)
Hemoglobin: 11.2 g/dL — ABNORMAL LOW (ref 13.0–17.0)
MCH: 32.7 pg (ref 26.0–34.0)
MCHC: 32.5 g/dL (ref 30.0–36.0)
MCV: 100.6 fL — ABNORMAL HIGH (ref 80.0–100.0)
Platelets: 246 10*3/uL (ref 150–400)
RBC: 3.43 MIL/uL — ABNORMAL LOW (ref 4.22–5.81)
RDW: 13.3 % (ref 11.5–15.5)
WBC: 6.3 10*3/uL (ref 4.0–10.5)
nRBC: 0 % (ref 0.0–0.2)

## 2023-03-14 NOTE — Progress Notes (Signed)
PCP - Dr. Ludwig Clarks Cardiologist - Dr. Jacinto Halim  Chest x-ray - 06/08/22 EKG - 11/28/22 Stress Test - 09-20-2018 ECHO - 04/26/2021 Cardiac Cath - denies  Sleep Study - 03/25/2019 CPAP - wears nightly   Blood Thinner Instructions: HOLD ELIQUIS 3 days Aspirin Instructions:  ERAS Protcol - clears until 5:30 PRE-SURGERY Ensure or G2-   COVID TEST- n/a   Anesthesia review: Yes, Heart History   Patient denies shortness of breath, fever, cough and chest pain at PAT appointment  Office called, spoke with surgery scheduler, Albin Felling. Current consent does not have a laterality. OR posting states RIGHT side. New consent pending.

## 2023-03-15 NOTE — Progress Notes (Signed)
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  Previously developed severe LV systolic dysfunction and was started on sotalol 12/18/2018, follow-up echo in August 2020 following cardioversion revealed normalization of LVEF.  Last seen by Dr. Melton Alar on 11/09/2022.  He was noted to be doing well at that time, weighing himself daily and making efforts to lose weight, blood pressure well-controlled, maintaining sinus rhythm.  Patient was cleared for surgery by Dr. Jacinto Halim in letter dated 01/31/2023 stating, "[name] is at low risk, from a cardiac standpoint, for his upcoming procedure: Repair of Mohs defect right nose.  It is ok to proceed without further cardiac testing. If applicable can hold Eliquis for 3 day(s) prior to procedure and re-start next day if possible otherwise 3-5 days post procedure."  Former smoker, 30 pack years, quit 1990.  Follows with pulmonology for history of OSA on CPAP, cough variant asthma, and GERD.  He is maintained on Dulera and omeprazole.  Preop labs reviewed, creatinine mildly elevated 1.30, mild anemia hemoglobin 11.2, otherwise unremarkable.  EKG 11/09/2022: Sinus rhythm with occasional PAC.  Rate 66.  Consider old anterior infarct.  Echocardiogram 04/26/2021: Normal LV systolic function with visual EF 50-55%. Left ventricle cavity is normal in size. Mild left ventricular hypertrophy. Normal global wall motion. Doppler evidence of grade II  diastolic dysfunction, normal LAP. Left atrial cavity is moderately dilated. Mild (Grade I) mitral regurgitation. Mild tricuspid regurgitation. Mild pulmonary hypertension. RVSP measures 49 mmHg. Moderate pulmonic regurgitation. IVC is dilated with a respiratory response of >50%. Compared to study 02/11/2019 normal diastolic filling  pattern is now G2DD, mild LAE is now moderate, mild PR is now moderate, PHTN is new finding, otherwise no significant change.  Lexiscan myoview stress test 09/20/2018:  1. Lexiscan stress test was performed. Exercise capacity was not assessed. Stress symptoms included dizziness. Resting blood pressure was 164/108 mmHg and peak effect blood pressure was 174/116 mmHg. The resting and stress electrocardiogram demonstrated atrial fibrillation with rapid ventricular rate, occasional PVC, and normal rest repolarization.  Stress EKG is non diagnostic for ischemia as it is a pharmacologic stress. In addition, no ischemic changes seen at heart rate of 136 bpm.  2. The overall quality of the study is good.  Left ventricular cavity is noted to be enlarged on the rest and stress studies.  Gated SPECT images reveal moderate global decrease in myocardial thickening and wall motion.  The left ventricular ejection fraction was calculated or visually estimated to be 34%.  REST and STRESS images demonstrate mildly decreased tracer uptake in the basal inferior, mid inferior and apical inferior segments of the left ventricle, with no reversibility. Findings suggestive of dilated nonishcemic cardiomyopathy.  3. High risk study due to reduced LVED. Recommend clinical correlation.    Zannie Cove [facility] Short Stay Center/Anesthesiology Phone [phone] 03/15/2023 11:14 AM

## 2023-03-15 NOTE — Progress Notes (Unsigned)
Subjective:   Patient ID: Darren [name], male    DOB: Nov 24, 1940   MRN: [id number]    Brief patient profile:  81   yowm quit smoking 1990 with cough that resolved and pattern of recurrent rhinitis since childhood spring > fall with onset of recurrent flares of cough / wheezing around 2005 resolves from days to weeks p prednisone cycle needing up to 4 x on avg  referred to pulmonary clinic 03/30/2016 by Dr   Dr Ludwig Clarks    History of Present Illness  03/30/2016 1st [facility] office visit/ Darren [name]   Chief Complaint  Patient presents with   Pulmonary Consult    Referred by Dr. Ralene Ok. Pt c/o cough and wheezing off and on "for years".    last prednisone was 15 days one week prior to OV  Back to baseline = doe x MMRC1 = can walk nl pace, flat grade, can't hurry or go uphills or steps s sob  On proair once each am  rec Plan A = Automatic = Symbicort 80 Take 2 puffs first thing in am and then another 2 puffs about 12 hours later.  Plan B = Backup Only use your albuterol as a rescue medication       02/10/2021  f/u ov/Darren [name] re: AB on dulera 100 2bid and singulair  Chief Complaint  Patient presents with   Follow-up    F/u on asthma. Breathing well, using cpap while sleeping.   Dyspnea:  goes to gym 2-4 x per week s sob, not checking wts  Cough: some pnds/ clear mucus constant daytime throat clearing worse  Sleeping: on left side bed is flat with wedge pillow and cpap  tol fine s noct resp cc  SABA use: none 02: none Covid status:   vax x  3  Rec Gabapetin 100 mg one at bedtime for a week, then twice daily for a week then 3 x daily x for a week then 4 x daily    11/28/2021  f/u ov/Darren [name] re: AB  maint on dulera 100 / singulair gabapentin 100 mg three times daily   Chief Complaint  Patient presents with   Follow-up    SOB with extreme activity    Dyspnea:  still going to gym 3 x per week not much treadmill ex-  can walk to stop sign 100 yards sev times a week s sob.  Cough:  sense of pnds / globus sensation > clear mucus minimal  Sleeping: on cpap / lots of sneezing  SABA use: none  02: none  Rec Omeprazole 20 mg Take 30- 60 min before your first and last meals of the day  GERD diet  Your entresto is likely contributing to your cough - may need to consider alternative  In meantime you can increase the gabapentin to 4 x daily     03/01/2022  f/u ov/Darren [name] re: AB maint on dulera 200 2bid/ singulair   Chief Complaint  Patient presents with   Follow-up    Cough improved but still has post-nasal drip, no c/o with C-Pap  Dyspnea:  still going to gym 3 x week  - able to do treadmill up flat x  2.5-3 lpm limited more by feet legs tired before breathing gives out and sats in 90s Cough: assoc with sense of pnds p stirring min mucoid  Sleeping: cpap does fine s cough  SABA use: none  02: none  Covid status:   vax 2  / infected x  one Rec The sacubitrilat component of entresto is not and ACEi but it does lead to higher levels of bradykinin (the culprit in ACEi related cough) because it reduces Neprilysin based clearance of bradykinin.The typical symptoms are dry daytime cough (9% per PI) or complaints of a new sensation of globus or excess PNDS.  No change in medications for now as your heart is a more important part of your problem than the cough and sense of drainage.  Off entresto since Dec 2023 > cough improved    09/01/2022  f/u ov/Darren [name] re: AB   maint on dulera 100 /montelukast   Chief Complaint  Patient presents with   Follow-up    Weight loss helping GERD.  Occasional cough with clear mucus.  Dyspnea:  still going to gym - tolerance is improving but not really doing aerobics  Cough: better and not even using gabapentin anymore for cough suppression  Sleeping: cpap s cough 6 in wedge mattress  SABA use: none  02: none  Rec No change rx Cpap per Darren [name]     03/16/2023  f/u ov/Darren [name] re: AB   maint on dulera 100   Chief Complaint  Patient presents with    Follow-up    Some throat clearing with coughing up mucus.  Dyspnea:  still going to gym but no aerobics/ very inactive  Cough: daytime/ clear mucus/ throat clearing / better p swallow water / assoc with ear fullness  Sleeping: cpap s  resp cc  SABA use: none  02: none    No obvious day to day or daytime variability or assoc excess/ purulent sputum or mucus plugs or hemoptysis or cp or chest tightness, subjective wheeze or overt sinus or hb symptoms.    Also denies any obvious fluctuation of symptoms with weather or environmental changes or other aggravating or alleviating factors except as outlined above   No unusual exposure hx or h/o childhood pna/ asthma or knowledge of premature birth.  Current Allergies, Complete Past Medical History, Past Surgical History, Family History, and Social History were reviewed in [employer] record.  ROS  The following are not active complaints unless bolded Hoarseness, sore throat, dysphagia, dental problems, itching, sneezing,  nasal congestion or discharge of excess mucus or purulent secretions, R ear ache,   fever, chills, sweats, unintended wt loss or wt gain, classically pleuritic or exertional cp,  orthopnea pnd or arm/hand swelling  or leg swelling, presyncope, palpitations, abdominal pain, anorexia, nausea, vomiting, diarrhea  or change in bowel habits or change in bladder habits, change in stools or change in urine, dysuria, hematuria,  rash, arthralgias, visual complaints, headache, numbness, weakness or ataxia or problems with walking or coordination,  change in mood or  memory.        Current Meds  Medication Sig   allopurinol (ZYLOPRIM) 100 MG tablet Take 100 mg by mouth daily.    apixaban (ELIQUIS) 5 MG TABS tablet Take 5 mg by mouth 2 (two) times daily.   atorvastatin (LIPITOR) 40 MG tablet Take 40 mg by mouth daily.    Cholecalciferol (VITAMIN D) 50 MCG (2000 UT) tablet Take 2,000 Units by mouth daily.   colesevelam  (WELCHOL) 625 MG tablet Take 1,875 mg by mouth 2 (two) times daily with a meal.    DULERA 100-5 MCG/ACT AERO USE 2 INHALATIONS TWICE A DAY   folic acid (FOLVITE) 1 MG tablet Take 1 tablet (1 mg total) by mouth daily.   furosemide (LASIX) 40 MG tablet Take  40 mg by mouth daily as needed for edema.   hydrALAZINE (APRESOLINE) 50 MG tablet TAKE 1 TABLET BY MOUTH 3 TIMES DAILY. (Patient taking differently: Take 50 mg by mouth in the morning and at bedtime.)   ibuprofen (ADVIL) 200 MG tablet Take 400-600 mg by mouth every 6 (six) hours as needed for moderate pain.   isosorbide mononitrate (IMDUR) 120 MG 24 hr tablet Take 120 mg by mouth daily.   L-Methylfolate-Algae-B12-B6 (METANX) 3-90.314-2-35 MG CAPS Take 1 tablet by mouth 2 (two) times daily.    Magnesium 400 MG TABS Take 400 mg by mouth daily at 12 noon.    metoprolol tartrate (LOPRESSOR) 25 MG tablet TAKE 1 TABLET BY MOUTH 2 TIMES DAILY.   Multiple Vitamin (MULTIVITAMIN WITH MINERALS) TABS tablet Take 1 tablet by mouth daily.   omeprazole (PRILOSEC) 20 MG capsule Take 20 mg by mouth 2 (two) times daily before a meal.   Polyethyl Glycol-Propyl Glycol (SYSTANE OP) Place 1 drop into both eyes 3 (three) times daily as needed (dry eyes).   thiamine (VITAMIN B-1) 100 MG tablet Take 100 mg by mouth daily.                    Objective:   Physical Exam  Wts  03/16/2023         246  09/01/2022       234  03/01/2022       262  11/28/2021       268   02/10/2021         263  02/11/2020         262  07/24/2018       262  01/21/2018       265  07/17/2017          258  03/29/2017        241 01/09/2017          263  11/28/2016        267  09/06/16         273   06/07/16 267 lb (121.1 kg)  05/10/16 264 lb (119.7 kg)  03/30/16 261 lb (118.4 kg)     Vital signs reviewed  03/16/2023  - Note at rest 02 sats  97% on RA   General appearance:    hoarse amb mod obese wm nad   HEENT : Oropharynx  clear  Nasal turbinates mod turbinate edema R ear impacted with  dried wax    NECK :  without  apparent JVD/ palpable Nodes/TM    LUNGS: no acc muscle use,  Mild barrel  contour chest wall with bilateral  Distant bs s audible wheeze and  without cough on insp or exp maneuvers  and mild  Hyperresonant  to  percussion bilaterally     CV:  RRR  no s3 or murmur or increase in P2, and  mild edema L > R LE    ABD: obese  soft and nontender    MS:  Nl gait/ ext warm without deformities Or obvious joint restrictions  calf tenderness, cyanosis or clubbing     SKIN: warm and dry without lesions    NEURO:  alert, approp, nl sensorium with  no motor or cerebellar deficits apparent.                Assessment & Plan:

## 2023-03-15 NOTE — Anesthesia Preprocedure Evaluation (Addendum)
Anesthesia Evaluation  Patient identified by MRN, date of birth, ID band Patient awake    Reviewed: Allergy & Precautions, H&P , NPO status , Patient's Chart, lab work & pertinent test results  Airway Mallampati: II  TM Distance: >3 FB Neck ROM: Full    Dental no notable dental hx.    Pulmonary sleep apnea , COPD, former smoker   Pulmonary exam normal breath sounds clear to auscultation       Cardiovascular hypertension, Normal cardiovascular exam+ dysrhythmias Atrial Fibrillation  Rhythm:Regular Rate:Normal     Neuro/Psych negative neurological ROS  negative psych ROS   GI/Hepatic negative GI ROS,,,(+)     substance abuse  alcohol use  Endo/Other  negative endocrine ROS    Renal/GU negative Renal ROS  negative genitourinary   Musculoskeletal negative musculoskeletal ROS (+)    Abdominal   Peds negative pediatric ROS (+)  Hematology negative hematology ROS (+)   Anesthesia Other Findings   Reproductive/Obstetrics negative OB ROS                             Anesthesia Physical Anesthesia Plan  ASA: 3  Anesthesia Plan: General   Post-op Pain Management:    Induction: Intravenous  PONV Risk Score and Plan: 2 and Ondansetron, Dexamethasone and Treatment may vary due to age or medical condition  Airway Management Planned: Oral ETT  Additional Equipment:   Intra-op Plan:   Post-operative Plan: Extubation in OR  Informed Consent: I have reviewed the patients History and Physical, chart, labs and discussed the procedure including the risks, benefits and alternatives for the proposed anesthesia with the patient or authorized representative who has indicated his/her understanding and acceptance.     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  Previously developed severe LV systolic dysfunction and was started on sotalol 12/18/2018, follow-up echo in August 2020 following cardioversion revealed normalization of LVEF.  Last seen by Dr. Melton Alar on 11/09/2022.  He was noted to be doing well at that time, weighing himself daily and making efforts to lose weight, blood pressure well-controlled, maintaining sinus rhythm.  Patient was cleared for surgery by Dr. Jacinto Halim in letter dated 01/31/2023 stating, "[name] is at low risk, from a cardiac standpoint, for his upcoming procedure: Repair of Mohs defect right nose.  It is ok to proceed without further cardiac testing. If applicable can hold Eliquis for 3 day(s) prior to procedure and re-start next day if possible otherwise 3-5 days post procedure."  Former smoker, 30 pack years, quit 1990.  Follows with pulmonology for history of OSA on CPAP, cough variant asthma, and GERD.  He is maintained on Dulera and omeprazole.  Preop labs reviewed, creatinine mildly elevated 1.30, mild anemia hemoglobin 11.2, otherwise unremarkable.  EKG 11/09/2022: Sinus rhythm with occasional PAC.  Rate 66.  Consider old anterior infarct.  Echocardiogram 04/26/2021: Normal LV systolic function with visual EF 50-55%. Left ventricle cavity is normal in size. Mild left ventricular hypertrophy. Normal global wall motion. Doppler evidence of grade II  diastolic dysfunction, normal LAP. Left atrial cavity is moderately dilated. Mild (Grade I) mitral regurgitation. Mild tricuspid regurgitation. Mild pulmonary hypertension. RVSP measures 49 mmHg. Moderate pulmonic regurgitation. IVC is  dilated with a respiratory response of >50%. Compared to study 02/11/2019 normal diastolic filling pattern is now G2DD, mild LAE is now moderate, mild PR is now moderate, PHTN is new  finding, otherwise no significant change.  Lexiscan myoview stress test 09/20/2018:  1. Lexiscan stress test was performed. Exercise capacity was not assessed. Stress symptoms included dizziness. Resting blood pressure was 164/108 mmHg and peak effect blood pressure was 174/116 mmHg. The resting and stress electrocardiogram demonstrated atrial fibrillation with rapid ventricular rate, occasional PVC, and normal rest repolarization.  Stress EKG is non diagnostic for ischemia as it is a pharmacologic stress. In addition, no ischemic changes seen at heart rate of 136 bpm.  2. The overall quality of the study is good.  Left ventricular cavity is noted to be enlarged on the rest and stress studies.  Gated SPECT images reveal moderate global decrease in myocardial thickening and wall motion.  The left ventricular ejection fraction was calculated or visually estimated to be 34%.  REST and STRESS images demonstrate mildly decreased tracer uptake in the basal inferior, mid inferior and apical inferior segments of the left ventricle, with no reversibility. Findings suggestive of dilated nonishcemic cardiomyopathy.  3. High risk study due to reduced LVED. Recommend clinical correlation.    )        Anesthesia Quick Evaluation

## 2023-03-16 ENCOUNTER — Ambulatory Visit (INDEPENDENT_AMBULATORY_CARE_PROVIDER_SITE_OTHER): Payer: Medicare Other | Admitting: Internal Medicine

## 2023-03-16 ENCOUNTER — Encounter: Payer: Self-pay | Admitting: Internal Medicine

## 2023-03-16 VITALS — BP 122/66 | HR 56 | Temp 99.1°F | Ht 71.0 in | Wt 246.6 lb

## 2023-03-16 DIAGNOSIS — J45991 Cough variant asthma: Secondary | ICD-10-CM | POA: Diagnosis not present

## 2023-03-16 DIAGNOSIS — J310 Chronic rhinitis: Secondary | ICD-10-CM | POA: Diagnosis not present

## 2023-03-16 DIAGNOSIS — J449 Chronic obstructive pulmonary disease, unspecified: Secondary | ICD-10-CM

## 2023-03-16 NOTE — Telephone Encounter (Signed)
Per rx alert Elwin Sleight is no longer on patient's insurance formulary.   Please choose: Advair Symibort Retta Mac  Thank you!

## 2023-03-16 NOTE — Patient Instructions (Addendum)
My office will be contacting you by phone for referral to ENT   - if you don't hear back from my office within one week please call us back or notify us thru MyChart and we'll address it right away.    Try zyrtec 10 mg daily as needed for throat clearing/ nasal congestion    Please schedule a follow up visit in 6  months but call sooner if needed - bring inhaler

## 2023-03-17 ENCOUNTER — Encounter: Payer: Self-pay | Admitting: Internal Medicine

## 2023-03-17 NOTE — Assessment & Plan Note (Signed)
Onset around 2005  FENO 03/30/2016  =   47 on just saba  - 03/30/2016  After extensive coaching HFA effectiveness =    90% :  Try symbicort 80 2bid x 6 weeks then return with repeat spirometry >  04/06/2016 notified insurance prefers advair > try 115 hfa 2 bid   - Allergy profile 05/10/2016 >  Eos 0.4 /  IgE 60 Pos Grass/ cedar trees   - 05/10/2016 changed back to 4 weeks of symb 80 p flared on advair - FENO 06/07/2016  =   10 on symb 80 2bid  - Spirometry 06/07/2016  FEV1 1.89 (58%)  Ratio  71   - 09/06/2016   try dulera 200 2bid - 11/28/2016 excess shaking on duelra 200 2bid so try one bid and off gerd rx since is not convinced worked - Sinus CT 11/29/2016 >>> Mucosal edema in the maxillary and ethmoid sinuses bilaterally. No air-fluid level. - Singulair 10 mg started first week in Sept 2018   - 07/17/2017   rechallenge with the dulera 100  - 07/24/2018  After extensive coaching inhaler device,  effectiveness =    90% but some "congestion" since started labetolol  - 02/10/2021 trial of gabapentin titrate up to 100 mg qid for incessant daytime throat clearing-  - still clearing throat on tid gabpentin so rec 100 qid, max gerd rx and consider trial off entresto  - 06/2022 stopped entresto > cough improved   Referred to ENT 03/16/2023

## 2023-03-17 NOTE — Assessment & Plan Note (Addendum)
Referred to ENT 03/16/2023 for rhinitis with R ear wax impaction as well   Rhinitis has been historically poorly controlled assoc with Upper airway cough syndrome (previously labeled PNDS),  is so named because it's frequently impossible to sort out how much is  CR/sinusitis with freq throat clearing (which can be related to primary GERD)   vs  causing  secondary (" extra esophageal")  GERD from wide swings in gastric pressure that occur with throat clearing, often  promoting self use of mint and menthol lozenges that reduce the lower esophageal sphincter tone and exacerbate the problem further in a cyclical fashion.   These are the same pts (now being labeled as having "irritable larynx syndrome" by some cough centers) who not infrequently have a history of having failed to tolerate ace inhibitors,  dry powder inhalers or biphosphonates or report having atypical/extraesophageal reflux symptoms that don't respond to standard doses of PPI  and are easily confused as having aecopd or asthma flares by even experienced allergists/ pulmonologists (myself included).   Rec:  ENT eval next  Try zyrtec 10 mg q d prn

## 2023-03-17 NOTE — Assessment & Plan Note (Signed)
Quit smoking 1990 Spirometry 03/30/2016  FEV1 1.47 (45%)  Ratio 62   - PFT's  01/21/2019  FEV1 1.48 (46 % ) ratio 0.58  p 23 % improvement from saba p dulera prior to study with DLCO  27 (104%) and corrects to 5.21 (132%)  for alv volume and FV curve typical airflow obst    Continues with cough > doe since he is very inactive so using dulera 100 for AB component and prn saba which he rarely needs so no change in rx needed but strongly advised to increase activity as tolerated

## 2023-03-17 NOTE — Assessment & Plan Note (Signed)
Complicated by hbp/ osa /hyperlipidemia   Body mass index is 34.39 kg/m.  -  trending up  Lab Results  Component Value Date   TSH 1.220 06/16/2022      Contributing to doe and risk of GERD/dvt/PE  >>>   reviewed the need and the process to achieve and maintain neg calorie balance > defer f/u primary care including intermittently monitoring thyroid status      F/u  q 6 m prn          Each maintenance medication was reviewed in detail including emphasizing most importantly the difference between maintenance and prns and under what circumstances the prns are to be triggered using an action plan format where appropriate.  Total time for H and P, chart review, counseling, reviewing hfa device(s) and generating customized AVS unique to this office visit / same day charting > 30 min for  refractory respiratory  symptoms of uncertain etiology

## 2023-03-21 ENCOUNTER — Encounter (HOSPITAL_COMMUNITY): Payer: Self-pay | Admitting: Otolaryngology

## 2023-03-21 ENCOUNTER — Ambulatory Visit (HOSPITAL_COMMUNITY)
Admission: RE | Admit: 2023-03-21 | Discharge: 2023-03-21 | Disposition: A | Discharge status: Home / Self Care | Payer: Medicare Other | Attending: Otolaryngology | Admitting: Otolaryngology

## 2023-03-21 ENCOUNTER — Other Ambulatory Visit: Payer: Self-pay

## 2023-03-21 ENCOUNTER — Encounter (HOSPITAL_COMMUNITY)
Admission: RE | Disposition: A | Discharge status: Home / Self Care | Payer: Self-pay | Source: Home / Self Care | Attending: Otolaryngology

## 2023-03-21 ENCOUNTER — Ambulatory Visit (HOSPITAL_COMMUNITY): Payer: Medicare Other | Admitting: Physician Assistant

## 2023-03-21 ENCOUNTER — Ambulatory Visit (HOSPITAL_BASED_OUTPATIENT_CLINIC_OR_DEPARTMENT_OTHER): Payer: Medicare Other | Admitting: Anesthesiology

## 2023-03-21 DIAGNOSIS — Z428 Encounter for other plastic and reconstructive surgery following medical procedure or healed injury: Secondary | ICD-10-CM | POA: Insufficient documentation

## 2023-03-21 DIAGNOSIS — Z87891 Personal history of nicotine dependence: Secondary | ICD-10-CM | POA: Insufficient documentation

## 2023-03-21 DIAGNOSIS — I110 Hypertensive heart disease with heart failure: Secondary | ICD-10-CM | POA: Diagnosis not present

## 2023-03-21 DIAGNOSIS — M950 Acquired deformity of nose: Secondary | ICD-10-CM | POA: Insufficient documentation

## 2023-03-21 DIAGNOSIS — C44311 Basal cell carcinoma of skin of nose: Secondary | ICD-10-CM | POA: Diagnosis not present

## 2023-03-21 DIAGNOSIS — Z08 Encounter for follow-up examination after completed treatment for malignant neoplasm: Secondary | ICD-10-CM | POA: Insufficient documentation

## 2023-03-21 DIAGNOSIS — G4730 Sleep apnea, unspecified: Secondary | ICD-10-CM | POA: Diagnosis not present

## 2023-03-21 DIAGNOSIS — I5031 Acute diastolic (congestive) heart failure: Secondary | ICD-10-CM

## 2023-03-21 DIAGNOSIS — J449 Chronic obstructive pulmonary disease, unspecified: Secondary | ICD-10-CM | POA: Insufficient documentation

## 2023-03-21 DIAGNOSIS — Z85828 Personal history of other malignant neoplasm of skin: Secondary | ICD-10-CM | POA: Diagnosis not present

## 2023-03-21 DIAGNOSIS — G4733 Obstructive sleep apnea (adult) (pediatric): Secondary | ICD-10-CM | POA: Insufficient documentation

## 2023-03-21 DIAGNOSIS — I4891 Unspecified atrial fibrillation: Secondary | ICD-10-CM | POA: Diagnosis not present

## 2023-03-21 DIAGNOSIS — I480 Paroxysmal atrial fibrillation: Secondary | ICD-10-CM | POA: Insufficient documentation

## 2023-03-21 DIAGNOSIS — I10 Essential (primary) hypertension: Secondary | ICD-10-CM | POA: Insufficient documentation

## 2023-03-21 DIAGNOSIS — Z7901 Long term (current) use of anticoagulants: Secondary | ICD-10-CM | POA: Insufficient documentation

## 2023-03-21 HISTORY — PX: EXCISION MASS HEAD: SHX6702

## 2023-03-21 HISTORY — PX: SKIN FULL THICKNESS GRAFT: SHX442

## 2023-03-21 SURGERY — EXCISION, MASS, HEAD
Anesthesia: General | Site: Nose | Laterality: Right

## 2023-03-21 MED ORDER — ROCURONIUM BROMIDE 10 MG/ML (PF) SYRINGE
PREFILLED_SYRINGE | INTRAVENOUS | Status: AC
  Filled 2023-03-21: qty 10

## 2023-03-21 MED ORDER — ONDANSETRON HCL 4 MG/2ML IJ SOLN
INTRAMUSCULAR | Status: DC | PRN
  Administered 2023-03-21: 4 mg via INTRAVENOUS

## 2023-03-21 MED ORDER — DEXAMETHASONE SODIUM PHOSPHATE 10 MG/ML IJ SOLN
INTRAMUSCULAR | Status: AC
  Filled 2023-03-21: qty 1

## 2023-03-21 MED ORDER — LIDOCAINE-EPINEPHRINE 1 %-1:100000 IJ SOLN
INTRAMUSCULAR | Status: AC
  Filled 2023-03-21: qty 1

## 2023-03-21 MED ORDER — LIDOCAINE 2% (20 MG/ML) 5 ML SYRINGE
INTRAMUSCULAR | Status: AC
  Filled 2023-03-21: qty 5

## 2023-03-21 MED ORDER — PROPOFOL 10 MG/ML IV BOLUS
INTRAVENOUS | Status: AC
  Filled 2023-03-21: qty 20

## 2023-03-21 MED ORDER — CHLORHEXIDINE GLUCONATE 0.12 % MT SOLN
15.0000 mL | Freq: Once | OROMUCOSAL | Status: AC
  Administered 2023-03-21: 15 mL via OROMUCOSAL
  Filled 2023-03-21: qty 15

## 2023-03-21 MED ORDER — VANCOMYCIN HCL 1500 MG/300ML IV SOLN
1500.0000 mg | INTRAVENOUS | Status: AC
  Administered 2023-03-21 (×2): 1500 mg via INTRAVENOUS
  Filled 2023-03-21: qty 300

## 2023-03-21 MED ORDER — LIDOCAINE-EPINEPHRINE 1 %-1:100000 IJ SOLN: INTRAMUSCULAR | Status: DC | PRN

## 2023-03-21 MED ORDER — LIDOCAINE-EPINEPHRINE 1 %-1:100000 IJ SOLN
INTRAMUSCULAR | Status: DC | PRN
Start: 2023-03-21 — End: 2023-03-21
  Administered 2023-03-21: 7 mL

## 2023-03-21 MED ORDER — ONDANSETRON HCL 4 MG/2ML IJ SOLN
INTRAMUSCULAR | Status: AC
  Filled 2023-03-21: qty 2

## 2023-03-21 MED ORDER — FENTANYL CITRATE (PF) 250 MCG/5ML IJ SOLN
INTRAMUSCULAR | Status: DC | PRN
  Administered 2023-03-21: 50 ug via INTRAVENOUS

## 2023-03-21 MED ORDER — PHENYLEPHRINE 80 MCG/ML (10ML) SYRINGE FOR IV PUSH (FOR BLOOD PRESSURE SUPPORT)
PREFILLED_SYRINGE | INTRAVENOUS | Status: AC
  Filled 2023-03-21: qty 10

## 2023-03-21 MED ORDER — LACTATED RINGERS IV SOLN: INTRAVENOUS | Status: DC

## 2023-03-21 MED ORDER — BACITRACIN ZINC 500 UNIT/GM EX OINT
TOPICAL_OINTMENT | CUTANEOUS | Status: DC | PRN
  Administered 2023-03-21: 1 via TOPICAL

## 2023-03-21 MED ORDER — BSS IO SOLN
INTRAOCULAR | Status: DC | PRN
  Administered 2023-03-21: 15 mL

## 2023-03-21 MED ORDER — LIDOCAINE 2% (20 MG/ML) 5 ML SYRINGE
INTRAMUSCULAR | Status: DC | PRN
  Administered 2023-03-21: 100 mg via INTRAVENOUS

## 2023-03-21 MED ORDER — PROPOFOL 500 MG/50ML IV EMUL
INTRAVENOUS | Status: DC | PRN
  Administered 2023-03-21: 25 ug/kg/min via INTRAVENOUS

## 2023-03-21 MED ORDER — BSS IO SOLN
INTRAOCULAR | Status: AC
  Filled 2023-03-21: qty 15

## 2023-03-21 MED ORDER — SODIUM BICARBONATE 8.4 % IV SOLN
INTRAVENOUS | Status: AC
  Filled 2023-03-21: qty 50

## 2023-03-21 MED ORDER — EPHEDRINE 5 MG/ML INJ
INTRAVENOUS | Status: AC
  Filled 2023-03-21: qty 5

## 2023-03-21 MED ORDER — SODIUM BICARBONATE 8.4 % IV SOLN
INTRAVENOUS | Status: DC | PRN
Start: 2023-03-21 — End: 2023-03-21
  Administered 2023-03-21: 1 mL

## 2023-03-21 MED ORDER — PROPOFOL 10 MG/ML IV BOLUS
INTRAVENOUS | Status: DC | PRN
  Administered 2023-03-21 (×2): 20 mg via INTRAVENOUS

## 2023-03-21 MED ORDER — ORAL CARE MOUTH RINSE: 15.0000 mL | Freq: Once | OROMUCOSAL | Status: AC

## 2023-03-21 MED ORDER — 0.9 % SODIUM CHLORIDE (POUR BTL) OPTIME
TOPICAL | Status: DC | PRN
  Administered 2023-03-21: 1000 mL

## 2023-03-21 MED ORDER — BACITRACIN ZINC 500 UNIT/GM EX OINT
TOPICAL_OINTMENT | CUTANEOUS | Status: AC
  Filled 2023-03-21: qty 28.35

## 2023-03-21 MED ORDER — FENTANYL CITRATE (PF) 250 MCG/5ML IJ SOLN
INTRAMUSCULAR | Status: AC
  Filled 2023-03-21: qty 5

## 2023-03-21 SURGICAL SUPPLY — 58 items
APL SRG 3 HI ABS STRL LF PLS (MISCELLANEOUS) ×4
APPLICATOR DR MATTHEWS STRL (MISCELLANEOUS) ×3 IMPLANT
BAG COUNTER SPONGE SURGICOUNT (BAG) ×2 IMPLANT
BAG SPNG CNTER NS LX DISP (BAG) ×2
BETADINE 5% OPHTHALMIC (OPHTHALMIC) ×4 IMPLANT
BLADE SURG 15 STRL LF DISP TIS (BLADE) ×2 IMPLANT
BLADE SURG 15 STRL SS (BLADE) ×2
CANISTER SUCT 3000ML PPV (MISCELLANEOUS) ×2 IMPLANT
CLEANER TIP ELECTROSURG 2X2 (MISCELLANEOUS) ×2 IMPLANT
CNTNR URN SCR LID CUP LEK RST (MISCELLANEOUS) ×2 IMPLANT
CONT SPEC 4OZ STRL OR WHT (MISCELLANEOUS) ×2
CORD BIPOLAR FORCEPS 12FT (ELECTRODE) IMPLANT
COVER SURGICAL LIGHT HANDLE (MISCELLANEOUS) ×2 IMPLANT
DRAPE HALF SHEET 40X57 (DRAPES) ×2 IMPLANT
DRSG TEGADERM 2-3/8X2-3/4 SM (GAUZE/BANDAGES/DRESSINGS) ×2 IMPLANT
DRSG TELFA 3X8 NADH STRL (GAUZE/BANDAGES/DRESSINGS) ×2 IMPLANT
ELECT COATED BLADE 2.86 ST (ELECTRODE) ×2 IMPLANT
ELECT NDL BLADE 2-5/6 (NEEDLE) ×1 IMPLANT
ELECT NEEDLE BLADE 2-5/6 (NEEDLE) ×2
ELECT REM PT RETURN 9FT ADLT (ELECTROSURGICAL) ×2
ELECTRODE REM PT RTRN 9FT ADLT (ELECTROSURGICAL) ×1 IMPLANT
GAUZE 4X4 16PLY ~~LOC~~+RFID DBL (SPONGE) ×2 IMPLANT
GAUZE XEROFORM 1X8 LF (GAUZE/BANDAGES/DRESSINGS) ×2 IMPLANT
GLOVE BIO SURGEON STRL SZ7.5 (GLOVE) ×2 IMPLANT
GLOVE BIOGEL PI IND STRL 8 (GLOVE) ×2 IMPLANT
GOWN STRL REUS W/ TWL LRG LVL3 (GOWN DISPOSABLE) ×2 IMPLANT
GOWN STRL REUS W/ TWL XL LVL3 (GOWN DISPOSABLE) ×2 IMPLANT
GOWN STRL REUS W/TWL LRG LVL3 (GOWN DISPOSABLE) ×2
GOWN STRL REUS W/TWL XL LVL3 (GOWN DISPOSABLE) ×2
KIT BASIN OR (CUSTOM PROCEDURE TRAY) ×2 IMPLANT
KIT TURNOVER KIT B (KITS) ×2 IMPLANT
MARKER SKIN DUAL TIP RULER LAB (MISCELLANEOUS) ×2 IMPLANT
NDL HYPO 30X.5 LL (NEEDLE) ×1 IMPLANT
NDL PRECISIONGLIDE 27X1.5 (NEEDLE) ×1 IMPLANT
NEEDLE HYPO 30X.5 LL (NEEDLE) ×2
NEEDLE PRECISIONGLIDE 27X1.5 (NEEDLE) ×2
NS IRRIG 1000ML POUR BTL (IV SOLUTION) ×2 IMPLANT
OPHTHALMIC BETADINE 5% (OPHTHALMIC) ×4
PAD ARMBOARD 7.5X6 YLW CONV (MISCELLANEOUS) ×4 IMPLANT
PATTIES SURGICAL .5 X3 (DISPOSABLE) IMPLANT
PENCIL SMOKE EVACUATOR (MISCELLANEOUS) ×2 IMPLANT
POSITIONER HEAD DONUT 9IN (MISCELLANEOUS) ×2 IMPLANT
SPONGE T-LAP 18X18 ~~LOC~~+RFID (SPONGE) ×2 IMPLANT
STAPLER VISISTAT 35W (STAPLE) ×2 IMPLANT
SUT CHROMIC 4 0 PS 2 18 (SUTURE) IMPLANT
SUT CHROMIC 5 0 P 3 (SUTURE) IMPLANT
SUT ETHILON 5 0 PS 2 18 (SUTURE) ×1 IMPLANT
SUT ETHILON 6 0 9-3 1X18 BLK (SUTURE) ×1 IMPLANT
SUT MON AB 5-0 P3 18 (SUTURE) ×1 IMPLANT
SUT SILK 4 0 CR 8 RB 1 (SUTURE) IMPLANT
SUT VIC AB 3-0 X1 27 (SUTURE) IMPLANT
SUT VIC AB 4-0 PS2 18 (SUTURE) ×1 IMPLANT
SUT VIC AB 4-0 PS2 27 (SUTURE) IMPLANT
SYR CONTROL 10ML LL (SYRINGE) ×2 IMPLANT
TOWEL GREEN STERILE FF (TOWEL DISPOSABLE) ×2 IMPLANT
TRAY ENT MC OR (CUSTOM PROCEDURE TRAY) ×2 IMPLANT
TUBE CONNECTING 12X1/4 (SUCTIONS) ×2 IMPLANT
WATER STERILE IRR 1000ML POUR (IV SOLUTION) ×2 IMPLANT

## 2023-03-21 NOTE — Transfer of Care (Signed)
Immediate Anesthesia Transfer of Care Note  Patient: Darren [name]  Procedure(s) Performed: EXCISION OF NOSE MOHS DEFECT WITH REPAIR (Right: Nose) ADJACENT TISSUE TRANSFER; POSSIBLE SKIN GRAFT FROM EAR OR FOREHEAD (Right) POSSIBLE NASOLABIAL FLAP; POSSIBLE EAR CARTILAGE GRAFT (Right)  Patient Location: PACU  Anesthesia Type:MAC  Level of Consciousness: awake, alert , oriented, and patient cooperative  Airway & Oxygen Therapy: Patient Spontanous Breathing  Post-op Assessment: Report given to RN and Post -op Vital signs reviewed and stable  Post vital signs: Reviewed and stable  Last Vitals:  Vitals Value Taken Time  BP 127/73 03/21/23 0939  Temp    Pulse 51 03/21/23 0940  Resp 16 03/21/23 0940  SpO2 96 % 03/21/23 0940  Vitals shown include unfiled device data.  Last Pain:  Vitals:   03/21/23 0930  TempSrc:   PainSc: 0-No pain         Complications: No notable events documented.

## 2023-03-21 NOTE — Op Note (Signed)
OPERATIVE NOTE  Darren [name] Date/Time of Admission: 03/21/2023  6:04 AM  CSN: [id number];MRN:[id number] Attending Provider: Scarlette Ar, MD Room/Bed: MCPO/NONE DOB: Sep 16, 1940 Age: 82 y.o.   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; Acquired nasal deformity  Procedure: Adjacent tissue transfer right nose with 12x4mm superiorly based transposition flap (CPT 14060) Excision wound head in preparation for flap <100cm2 (CPT 15004)  Anesthesia: General  Surgeon(s): Mervin Kung, MD  Staff: Circulator: Tawny Hopping, RN Scrub Person: Pietro Cassis, RN  Implants: * No implants in log *  Specimens: * No specimens in log *  Complications: none  EBL: 5 ML  IVF: Per anesthesia ML  Condition: stable  Operative Findings:  12x43mm mohs defect right nasal ala partial thickness repaired with transposition flap  Description of Operation: The patient was identified in the preoperative area and consent confirmed in the chart.  He was brought to the operating room by the anesthetist and a preoperative huddle was performed confirming patient identity and procedure to be performed.  Once all were in agreement we proceed with surgery.  MAC anesthesia was induced.   The patient's nasal dressings were removed demonstrating the right nasal alar defect 12x97mm soft tissue partial thickness down to SMAS.  A right sided superiorly based cheek to nose transposition flap 12x20mm was marked out and local anesthesia infiltrated (1% lidocaine with 1:100k epi, with 8.4% sodium bicarb). The patient was prepped and draped in standard fashion.   A final preoperative pause was performed and we proceeded with surgery.  We began with excision and debridement of the nasal wound for inset of the flap the wound edges were sharply cleansed and a sub-SMAS plane was gently developed several  millimeters circumferentially to allow for tension-free closure.  Hemostasis was achieved with the Bovie.    Next the 20 x 12 mm superiorly based cheek and nose transposition flap was marked out with the inferior limb extending towards the nasolabial crease and preservation of the nasal labial dizziness.  15 blade was used to raise the flap in the subcutaneous plane.  This was transposed with the nasal defect.  It was inset with buried interrupted 5-0 Monocryl sutures and interrupted 6-0 nylon sutures.  The cheek and upper lip donor site was dissected in a subcutaneous plane sharply to create tension-free closure of the donor site.  Donor site was closed with buried interrupted 4-0 Vicryl sutures and interrupted 5-0 nylon sutures.   The wounds were cleansed.  The flap was reassessed demonstrating good capillary refill and perfusion.  The wound's were then dressed with a count of bacitracin, Telfa and brown paper tape.  The patient was then turned back to the anesthetist who awakened him brought to the recovery room in stable condition.  All counts were correct and final.   Mervin Kung, MD [facility]  03/21/2023

## 2023-03-21 NOTE — H&P (Signed)
Darren [name] is an 82 y.o. male.    Chief Complaint:  Mohs defect of nose  HPI: Patient presents today for planned elective procedure.  He/she denies any interval change in history since office visit on 01/23/23.   Past Medical History:  Diagnosis Date   Arthritis    "maybe a little"   Asthma    "mild"   Atrial fibrillation (HCC)    Atypical mole 08/16/2011   left upper back tx exc atypical prol.   Atypical mole 12/18/2012   solar lentigo atypical tx w/s   bilateral leg edema 09/11/2018   Cancer (HCC)    prostate cancer   CHF (congestive heart failure) (HCC)    resolved with low salt diet   Dyspnea    Dyspnea on exertion 09/11/2018   Dysrhythmia    Afib with rapid ventricular   Essential hypertension 07/25/2018   Fatty liver    GERD (gastroesophageal reflux disease)    GI bleed 2018   Gout    History of blood transfusion 2018   Obstructive sleep apnea 09/11/2018   OSA (obstructive sleep apnea)    Paroxysmal atrial fibrillation (HCC) 09/11/2018   Pre-diabetes     Past Surgical History:  Procedure Laterality Date   CARDIOVERSION N/A 10/08/2018   Procedure: CARDIOVERSION;  Surgeon: Yates Decamp, MD;  Location: [facility] ENDOSCOPY;  Service: Cardiovascular;  Laterality: N/A;   CARDIOVERSION N/A 01/17/2019   Procedure: CARDIOVERSION;  Surgeon: Yates Decamp, MD;  Location: [facility] ENDOSCOPY;  Service: Cardiovascular;  Laterality: N/A;   COLONOSCOPY Left 03/10/2017   Procedure: COLONOSCOPY;  Surgeon: Willis Modena, MD;  Location: [facility] ENDOSCOPY;  Service: Endoscopy;  Laterality: Left;   ESOPHAGOGASTRODUODENOSCOPY (EGD) WITH PROPOFOL Left 03/09/2017   Procedure: ESOPHAGOGASTRODUODENOSCOPY (EGD) WITH PROPOFOL;  Surgeon: Kerin Salen, MD;  Location: [facility] ENDOSCOPY;  Service: Gastroenterology;  Laterality: Left;   EYE SURGERY Bilateral    cataract    FINGER SURGERY Right    5th   Prostate     DaVinci    Family History  Problem Relation Age of Onset   Emphysema Father        smoked     Social History:  reports that he quit smoking about 34 years ago. His smoking use included cigarettes. He started smoking about 64 years ago. He has a 30 pack-year smoking history. He has never used smokeless tobacco. He reports current alcohol use of about 25.0 standard drinks of alcohol per week. He reports that he does not use drugs.  Allergies:  Allergies  Allergen Reactions   Amlodipine     Leg edema   Penicillins Hives    Medications Prior to Admission  Medication Sig Dispense Refill   allopurinol (ZYLOPRIM) 100 MG tablet Take 100 mg by mouth daily.      apixaban (ELIQUIS) 5 MG TABS tablet Take 5 mg by mouth 2 (two) times daily.     atorvastatin (LIPITOR) 40 MG tablet Take 40 mg by mouth daily.      Cholecalciferol (VITAMIN D) 50 MCG (2000 UT) tablet Take 2,000 Units by mouth daily.     colesevelam (WELCHOL) 625 MG tablet Take 1,875 mg by mouth 2 (two) times daily with a meal.      DULERA 100-5 MCG/ACT AERO USE 2 INHALATIONS TWICE A DAY 39 g 3   folic acid (FOLVITE) 1 MG tablet Take 1 tablet (1 mg total) by mouth daily.     hydrALAZINE (APRESOLINE) 50 MG tablet TAKE 1 TABLET BY MOUTH 3 TIMES DAILY. (  Patient taking differently: Take 50 mg by mouth in the morning and at bedtime.) 270 tablet 0   isosorbide mononitrate (IMDUR) 120 MG 24 hr tablet Take 120 mg by mouth daily.     L-Methylfolate-Algae-B12-B6 (METANX) 3-90.314-2-35 MG CAPS Take 1 tablet by mouth 2 (two) times daily.      losartan (COZAAR) 25 MG tablet Take 1 tablet (25 mg total) by mouth at bedtime. 90 tablet 3   Magnesium 400 MG TABS Take 400 mg by mouth daily at 12 noon.      metoprolol tartrate (LOPRESSOR) 25 MG tablet TAKE 1 TABLET BY MOUTH 2 TIMES DAILY. 180 tablet 3   Multiple Vitamin (MULTIVITAMIN WITH MINERALS) TABS tablet Take 1 tablet by mouth daily.     omeprazole (PRILOSEC) 20 MG capsule Take 20 mg by mouth 2 (two) times daily before a meal.     Polyethyl Glycol-Propyl Glycol (SYSTANE OP) Place 1 drop into  both eyes 3 (three) times daily as needed (dry eyes).     thiamine (VITAMIN B-1) 100 MG tablet Take 100 mg by mouth daily.     furosemide (LASIX) 40 MG tablet Take 40 mg by mouth daily as needed for edema.     ibuprofen (ADVIL) 200 MG tablet Take 400-600 mg by mouth every 6 (six) hours as needed for moderate pain.      No results found for this or any previous visit (from the past 48 hour(s)). No results found.  ROS: negative other than stated in HPI  Blood pressure (!) 154/87, pulse (!) 57, temperature 97.8 F (36.6 C), temperature source Oral, resp. rate 18, height 5\' 11"  (1.803 m), weight 108.9 kg, SpO2 96%.  PHYSICAL EXAM: General: Resting comfortably in NAD  Lungs: Non-labored respiratinos  Studies Reviewed: None   Assessment/Plan Mohs defect of right nose Basal cell carcinoma skin of right nose Acquired nasal deformity  Proceed with repair mohs defect of right nose, adjacent tissue transfer under MAC. Informed consent obtained. RBA discussed.    Electronically signed by:  Scarlette Ar, MD  Staff Physician Facial Plastic & Reconstructive Surgery Otolaryngology - Head and Neck Surgery [facility] - [facility]  03/21/2023, 8:24 AM

## 2023-03-21 NOTE — Anesthesia Postprocedure Evaluation (Signed)
Anesthesia Post Note  Patient: Darren [name]  Procedure(s) Performed: EXCISION OF NOSE MOHS DEFECT WITH REPAIR (Right: Nose) ADJACENT TISSUE TRANSFER; POSSIBLE SKIN GRAFT FROM EAR OR FOREHEAD (Right) POSSIBLE NASOLABIAL FLAP; POSSIBLE EAR CARTILAGE GRAFT (Right)     Patient location during evaluation: PACU Anesthesia Type: General Level of consciousness: awake and alert Pain management: pain level controlled Vital Signs Assessment: post-procedure vital signs reviewed and stable Respiratory status: spontaneous breathing, nonlabored ventilation, respiratory function stable and patient connected to nasal cannula oxygen Cardiovascular status: stable and blood pressure returned to baseline Postop Assessment: no apparent nausea or vomiting Anesthetic complications: no  No notable events documented.  Last Vitals:  Vitals:   03/21/23 0940 03/21/23 0955  BP: 127/73 133/68  Pulse: (!) 54 (!) 54  Resp: 18 17  Temp: 36.9 C 36.9 C  SpO2: 96% 96%    Last Pain:  Vitals:   03/21/23 0940  TempSrc:   PainSc: 0-No pain                 Alnisa Hasley S

## 2023-03-21 NOTE — Discharge Instructions (Signed)

## 2023-03-22 ENCOUNTER — Encounter (HOSPITAL_COMMUNITY): Payer: Self-pay | Admitting: Otolaryngology

## 2023-03-26 ENCOUNTER — Other Ambulatory Visit: Payer: Self-pay

## 2023-03-26 MED ORDER — LOSARTAN POTASSIUM 25 MG PO TABS: 25.0000 mg | ORAL_TABLET | Freq: Every day | ORAL | 3 refills | Status: DC

## 2023-03-29 ENCOUNTER — Ambulatory Visit: Payer: Medicare Other | Admitting: Internal Medicine

## 2023-04-27 ENCOUNTER — Other Ambulatory Visit: Payer: Self-pay | Admitting: Internal Medicine

## 2023-04-27 ENCOUNTER — Ambulatory Visit
Admission: RE | Admit: 2023-04-27 | Discharge: 2023-04-27 | Disposition: A | Discharge status: Home / Self Care | Payer: Medicare Other | Source: Ambulatory Visit | Attending: Internal Medicine | Admitting: Internal Medicine

## 2023-04-27 DIAGNOSIS — K5909 Other constipation: Secondary | ICD-10-CM

## 2023-05-08 ENCOUNTER — Encounter (INDEPENDENT_AMBULATORY_CARE_PROVIDER_SITE_OTHER): Payer: Self-pay

## 2023-05-08 ENCOUNTER — Ambulatory Visit (INDEPENDENT_AMBULATORY_CARE_PROVIDER_SITE_OTHER): Payer: Medicare Other | Admitting: Otolaryngology

## 2023-05-08 VITALS — Ht 71.0 in | Wt 231.0 lb

## 2023-05-08 DIAGNOSIS — R0982 Postnasal drip: Secondary | ICD-10-CM | POA: Diagnosis not present

## 2023-05-08 DIAGNOSIS — R053 Chronic cough: Secondary | ICD-10-CM | POA: Diagnosis not present

## 2023-05-08 DIAGNOSIS — R0989 Other specified symptoms and signs involving the circulatory and respiratory systems: Secondary | ICD-10-CM

## 2023-05-08 DIAGNOSIS — J3089 Other allergic rhinitis: Secondary | ICD-10-CM

## 2023-05-08 DIAGNOSIS — R490 Dysphonia: Secondary | ICD-10-CM

## 2023-05-08 DIAGNOSIS — R058 Other specified cough: Secondary | ICD-10-CM

## 2023-05-08 MED ORDER — FLUTICASONE PROPIONATE 50 MCG/ACT NA SUSP: 2.0000 | Freq: Every day | NASAL | 6 refills | Status: DC

## 2023-05-08 MED ORDER — GABAPENTIN 100 MG PO CAPS: 100.0000 mg | ORAL_CAPSULE | Freq: Every day | ORAL | 3 refills | Status: DC

## 2023-05-08 NOTE — Progress Notes (Unsigned)
Dear Dr. Sherene Sires, Here is my assessment for our mutual patient, Darren [name]. Thank you for allowing me the opportunity to care for your patient. Please do not hesitate to contact me should you have any other questions. Sincerely, Dr. Jovita Kussmaul  Otolaryngology Clinic Note Referring provider: Dr. Sherene Sires HPI:  Darren [name] is a 82 y.o. male kindly referred by Dr. Sherene Sires for evaluation of right ear fullness and chronic cough.  Followed by Dr. Sherene Sires for several years, and he reports that the cough is intermittent and clearing his throat. Some PND. No significant sinonasal history/CRS symptoms or sinus infections.   He reports that the cough is annoying, and he also has some intermittent dysphonia. He clears his throat often. He reports that he does not know a trigger for the cough. Nothing seems to make things worse.   He has tried medication changes Sherryll Burger), Omeprazole, prior trial of Gabapentin, and Dulera. He has not used any nasal sprays - has used flonase, but not astelin.   Reports no prior Abx/steroid recently, but has had prednisone prior. No trouble swallowing, no odynophagia, dysphagia, ear pain, SOB.  Right ear fullness: intermittent, can pop his ears, no subjective HL, no significant vertigo or other otologic history. When his ears pop, he feels much better.  PMHx: COPD, HTN, A-fib on eliquis, HLD, prior AKI. No strokes or CAD  H&N Surgery: recent MOHS surgery - nasal Personal or FHx of bleeding dz or anesthesia difficulty: no  Independent Review of Additional Tests or Records:  Dr. Thurston Hole notes and PFTs and other results independently reviewed: with cough that resolved and pattern of recurrent rhinitis since childhood spring > fall with onset of recurrent flares of cough / wheezing around 2005 resolves from days to weeks p prednisone cycle needing up to 4 x on avg  referred to pulmonary clinic 03/30/2016 by Dr   Dr Ludwig Clarks  Dyspnea:  still going to gym 3 x per week not much  treadmill ex-  can walk to stop sign 100 yards sev times a week s sob.  Cough: sense of pnds / globus sensation > clear mucus minimal  Sleeping: on cpap / lots of sneezing  Takes omeprazole; prior gabapentin use  Referred to ENT 03/16/2023 for rhinitis with R ear wax impaction as well    Rhinitis has been historically poorly controlled assoc with Upper airway cough syndrome (previously labeled PNDS),  is so named because it's frequently impossible to sort out how much is  CR/sinusitis with freq throat clearing (which can be related to primary GERD)   vs  causing  secondary (" extra esophageal")  GERD from wide swings in gastric pressure that occur with throat clearing, often  promoting self use of mint and menthol lozenges that reduce the lower esophageal sphincter tone and exacerbate the problem further in a cyclical fashion.    These are the same pts (now being labeled as having "irritable larynx syndrome" by some cough centers) who not infrequently have a history of having failed to tolerate ace inhibitors,  dry powder inhalers or biphosphonates or report having atypical/extraesophageal reflux symptoms that don't respond to standard doses of PPI  and are easily confused as having aecopd or asthma flares by even experienced allergists/ pulmonologists (myself included).       - Allergy profile 05/10/2016 >  Eos 0.4 /  IgE 60 Pos Grass/ cedar trees   - 05/10/2016 changed back to 4 weeks of symb 80 p flared on advair - FENO 06/07/2016  =  10 on symb 80 2bid  - Spirometry 06/07/2016  FEV1 1.89 (58%)  Ratio  71   - 09/06/2016   try dulera 200 2bid - 11/28/2016 excess shaking on duelra 200 2bid so try one bid and off gerd rx since is not convinced worked - Sinus CT 11/29/2016 >>> Mucosal edema in the maxillary and ethmoid sinuses bilaterally. No air-fluid level. - Singulair 10 mg started first week in Sept 2018   - 06/2022 stopped entresto > cough improved   CT Sinus (2019): limited; some max and ethmoid  disease    PMH/Meds/All/SocHx/FamHx/ROS:   Past Medical History:  Diagnosis Date   Arthritis    "maybe a little"   Asthma    "mild"   Atrial fibrillation (HCC)    Atypical mole 08/16/2011   left upper back tx exc atypical prol.   Atypical mole 12/18/2012   solar lentigo atypical tx w/s   bilateral leg edema 09/11/2018   Cancer (HCC)    prostate cancer   CHF (congestive heart failure) (HCC)    resolved with low salt diet   Dyspnea    Dyspnea on exertion 09/11/2018   Dysrhythmia    Afib with rapid ventricular   Essential hypertension 07/25/2018   Fatty liver    GERD (gastroesophageal reflux disease)    GI bleed 2018   Gout    History of blood transfusion 2018   Obstructive sleep apnea 09/11/2018   OSA (obstructive sleep apnea)    Paroxysmal atrial fibrillation (HCC) 09/11/2018   Pre-diabetes      Past Surgical History:  Procedure Laterality Date   CARDIOVERSION N/A 10/08/2018   Procedure: CARDIOVERSION;  Surgeon: Yates Decamp, MD;  Location: [facility] ENDOSCOPY;  Service: Cardiovascular;  Laterality: N/A;   CARDIOVERSION N/A 01/17/2019   Procedure: CARDIOVERSION;  Surgeon: Yates Decamp, MD;  Location: [facility] ENDOSCOPY;  Service: Cardiovascular;  Laterality: N/A;   COLONOSCOPY Left 03/10/2017   Procedure: COLONOSCOPY;  Surgeon: Willis Modena, MD;  Location: [facility] ENDOSCOPY;  Service: Endoscopy;  Laterality: Left;   ESOPHAGOGASTRODUODENOSCOPY (EGD) WITH PROPOFOL Left 03/09/2017   Procedure: ESOPHAGOGASTRODUODENOSCOPY (EGD) WITH PROPOFOL;  Surgeon: Kerin Salen, MD;  Location: [facility] ENDOSCOPY;  Service: Gastroenterology;  Laterality: Left;   EXCISION MASS HEAD Right 03/21/2023   Procedure: EXCISION OF NOSE MOHS DEFECT WITH REPAIR;  Surgeon: Scarlette Ar, MD;  Location: [facility] OR;  Service: ENT;  Laterality: Right;   EYE SURGERY Bilateral    cataract    FINGER SURGERY Right    5th   Prostate     DaVinci   SKIN FULL THICKNESS GRAFT Right 03/21/2023   Procedure: ADJACENT TISSUE TRANSFER;   Surgeon: Scarlette Ar, MD;  Location: [facility] OR;  Service: ENT;  Laterality: Right;    Family History  Problem Relation Age of Onset   Emphysema Father        smoked     Social Connections: Not on file      Current Outpatient Medications:    allopurinol (ZYLOPRIM) 100 MG tablet, Take 100 mg by mouth daily. , Disp: , Rfl:    atorvastatin (LIPITOR) 40 MG tablet, Take 40 mg by mouth daily. , Disp: , Rfl:    Cholecalciferol (VITAMIN D) 50 MCG (2000 UT) tablet, Take 2,000 Units by mouth daily., Disp: , Rfl:    colesevelam (WELCHOL) 625 MG tablet, Take 1,875 mg by mouth 2 (two) times daily with a meal. , Disp: , Rfl:    DULERA 100-5 MCG/ACT AERO, USE 2 INHALATIONS TWICE A DAY, Disp: 39  g, Rfl: 3   folic acid (FOLVITE) 1 MG tablet, Take 1 tablet (1 mg total) by mouth daily., Disp: , Rfl:    furosemide (LASIX) 40 MG tablet, Take 40 mg by mouth daily as needed for edema., Disp: , Rfl:    hydrALAZINE (APRESOLINE) 50 MG tablet, TAKE 1 TABLET BY MOUTH 3 TIMES DAILY. (Patient taking differently: Take 50 mg by mouth in the morning and at bedtime.), Disp: 270 tablet, Rfl: 0   ibuprofen (ADVIL) 200 MG tablet, Take 400-600 mg by mouth every 6 (six) hours as needed for moderate pain., Disp: , Rfl:    isosorbide mononitrate (IMDUR) 120 MG 24 hr tablet, Take 120 mg by mouth daily., Disp: , Rfl:    L-Methylfolate-Algae-B12-B6 (METANX) 3-90.314-2-35 MG CAPS, Take 1 tablet by mouth 2 (two) times daily. , Disp: , Rfl:    losartan (COZAAR) 25 MG tablet, Take 1 tablet (25 mg total) by mouth at bedtime., Disp: 90 tablet, Rfl: 3   Magnesium 400 MG TABS, Take 400 mg by mouth daily at 12 noon. , Disp: , Rfl:    metoprolol tartrate (LOPRESSOR) 25 MG tablet, TAKE 1 TABLET BY MOUTH 2 TIMES DAILY., Disp: 180 tablet, Rfl: 3   Multiple Vitamin (MULTIVITAMIN WITH MINERALS) TABS tablet, Take 1 tablet by mouth daily., Disp: , Rfl:    omeprazole (PRILOSEC) 20 MG capsule, Take 20 mg by mouth 2 (two) times daily before a meal.,  Disp: , Rfl:    Polyethyl Glycol-Propyl Glycol (SYSTANE OP), Place 1 drop into both eyes 3 (three) times daily as needed (dry eyes)., Disp: , Rfl:    thiamine (VITAMIN B-1) 100 MG tablet, Take 100 mg by mouth daily., Disp: , Rfl:    apixaban (ELIQUIS) 5 MG TABS tablet, Take 5 mg by mouth 2 (two) times daily. (Patient not taking: Reported on 05/08/2023), Disp: , Rfl:    Physical Exam:   Ht 5\' 11"  (1.803 m)   Wt 231 lb (104.8 kg)   BMI 32.22 kg/m    Salient findings:  CN II-XII intact  Bilateral EAC clear and TM intact with well pneumatized middle ear spaces Weber 512: midline Rinne 512: AC > BC b/l  Rine 1024: AC > BC b/l  Anterior rhinoscopy: Septum relatively mildline; bilateral inferior turbinates with mild hypertrophy; right nasal alar subunit reconstruction well healed No lesions of oral cavity/oropharynx No obviously palpable neck masses/lymphadenopathy/thyromegaly No respiratory distress or stridor; voice quality class 2.5 - frequent throat clearing; given complaints regarding chronic cough and dysphonia and throat clearing, flexible laryngoscopy indicated for further evaluation  Procedures:  Procedure Note Pre-procedure diagnosis:  Dysphonia, throat clearing, chronic cough Post-procedure diagnosis: Same Procedure: Transnasal Fiberoptic Laryngoscopy, CPT 16109 - Mod 25 Indication: see above Complications: None apparent EBL: 0 mL Date: 05/09/23   The procedure was undertaken to further evaluate the patient's complaint of Dysphonia, throat clearing, chronic cough, with mirror exam inadequate for appropriate examination due to gag reflex and poor patient tolerance  Procedure:  Patient was identified as correct patient. Verbal consent was obtained. The nose was sprayed with oxymetazoline and 4% lidocaine. The The flexible laryngoscope was passed through the nose to view the nasal cavity, pharynx (oropharynx, hypopharynx) and larynx.  The larynx was examined at rest and during  multiple phonatory tasks. Documentation was obtained and reviewed with patient. The scope was removed. The patient tolerated the procedure well.  Findings: The nasal cavity and nasopharynx did not reveal any masses or lesions, mucosa appeared to be without obvious  lesions. The tongue base, pharyngeal walls, piriform sinuses, vallecula, epiglottis and postcricoid region are normal in appearance. The visualized portion of the subglottis and proximal trachea is widely patent. The vocal folds are mobile bilaterally. There are no lesions on the free edge of the vocal folds nor elsewhere in the larynx worrisome for malignancy.  No significant retained secretions bilateral pyriform. Age appropriate b/l VF atrophy    Electronically signed by: Read Drivers, MD 05/09/2023 4:49 PM   Impression & Plans:  Darren [name] is a 82 y.o. male with h/o  COPD, HTN, A-fib on eliquis, HLD, CKD now with: Chronic cough and throat clearing - appears to be consistent with upper airway cough syndrome given nature of complaint and improvement with prior gabapentin. TFL reassuring. He's already on PPI for GERD - We discussed options. He is not interested in therapy for throat clearing or dysphonia; He wishes to try gabapentin again, so will start with 100mg  at bedtime and can go up to 200mg  at bedtime if needed for now 2.  Post nasal drip and some allergic rhinitis symptoms: will begin flonase two puffs BID and eval  He does not wish to do anything invasive or extensive. This may be reasonable place to start. Not having any sinonasal infections so will avoid any further intervention from that standpoint esp with prior CT  Meds ordered this encounter  Medications   fluticasone (FLONASE) 50 MCG/ACT nasal spray    Sig: Place 2 sprays into both nostrils daily. Use for 6-8 weeks    Dispense:  16 g    Refill:  6   gabapentin (NEURONTIN) 100 MG capsule    Sig: Take 1 capsule (100 mg total) by mouth at bedtime.     Dispense:  90 capsule    Refill:  3    - f/u 6 months  MDM:  Level 4 Data complexity: independent test and CT interpretation - Morbidity: mod  - Prescription Drug prescribed or managed: yes     Thank you for allowing me the opportunity to care for your patient. Please do not hesitate to contact me should you have any other questions.  Sincerely, Jovita Kussmaul, MD Otolarynoglogist (ENT), [facility] Phone: [phone] Fax: [phone]  05/08/2023, 10:04 AM

## 2023-05-14 ENCOUNTER — Ambulatory Visit: Payer: Self-pay | Admitting: Cardiology

## 2023-05-22 ENCOUNTER — Telehealth: Payer: Self-pay | Admitting: Cardiology

## 2023-05-22 DIAGNOSIS — I5042 Chronic combined systolic (congestive) and diastolic (congestive) heart failure: Secondary | ICD-10-CM

## 2023-05-22 NOTE — Telephone Encounter (Signed)
*  STAT* If patient is at the pharmacy, call can be transferred to refill team.   1. Which medications need to be refilled? (please list name of each medication and dose if known) hydrALAZINE (APRESOLINE) 50 MG tablet   metoprolol tartrate (LOPRESSOR) 25 MG tablet   2. Which pharmacy/location (including street and city if local pharmacy) is medication to be sent to? [facility] - [place], [place] - [address] Phone: [phone]  Fax: [phone]      3. Do they need a 30 day or 90 day supply? 90

## 2023-05-23 MED ORDER — METOPROLOL TARTRATE 25 MG PO TABS: 25.0000 mg | ORAL_TABLET | Freq: Two times a day (BID) | ORAL | 3 refills | Status: DC

## 2023-05-23 MED ORDER — HYDRALAZINE HCL 50 MG PO TABS: 50.0000 mg | ORAL_TABLET | Freq: Three times a day (TID) | ORAL | 3 refills | Status: DC

## 2023-07-26 ENCOUNTER — Encounter: Payer: Self-pay | Admitting: Cardiology

## 2023-07-26 ENCOUNTER — Other Ambulatory Visit: Payer: Self-pay | Admitting: Internal Medicine

## 2023-07-26 ENCOUNTER — Ambulatory Visit: Payer: Medicare Other | Attending: Cardiology | Admitting: Cardiology

## 2023-07-26 VITALS — BP 142/72 | HR 72 | Resp 16 | Ht 71.0 in | Wt 248.0 lb

## 2023-07-26 DIAGNOSIS — I5042 Chronic combined systolic (congestive) and diastolic (congestive) heart failure: Secondary | ICD-10-CM | POA: Diagnosis present

## 2023-07-26 DIAGNOSIS — I10 Essential (primary) hypertension: Secondary | ICD-10-CM | POA: Insufficient documentation

## 2023-07-26 DIAGNOSIS — I5032 Chronic diastolic (congestive) heart failure: Secondary | ICD-10-CM | POA: Diagnosis not present

## 2023-07-26 DIAGNOSIS — I480 Paroxysmal atrial fibrillation: Secondary | ICD-10-CM | POA: Diagnosis present

## 2023-07-26 MED ORDER — ISOSORBIDE DINITRATE 30 MG PO TABS
30.0000 mg | ORAL_TABLET | Freq: Three times a day (TID) | ORAL | 3 refills | Status: DC
Start: 2023-07-26 — End: 2024-04-02

## 2023-07-26 NOTE — Progress Notes (Signed)
Cardiology Office Note:  .   Date:  07/26/2023  ID:  Darren [name], DOB Jun 29, 1941, MRN [id number] PCP: Ralene Ok, MD  [facility] Providers Cardiologist:  Yates Decamp, MD   History of Present Illness: Darren [name]   Darren [name] is a 83 y.o. Caucasian male patient with moderate obesity with OSA and compliant with CPAP, A-fib induced LV systolic dysfunction which is resolved and was on sotalol, which was eventually discontinued due to bradycardia hypertension, hypercholesterolemia presents here for 40-month office visit.  Discussed the use of AI scribe software for clinical note transcription with the patient, who gave verbal consent to proceed.  History of Present Illness   The patient, with a history of hypertension, presents for a routine follow-up. He reports a recent weight gain of 10 pounds, which he attributes to dietary changes and a decrease in physical activity due to joint pain and general laziness. He notes that his blood pressure, which he monitors at home, has increased slightly with the weight gain. He has been managing his hypertension with hydralazine and isosorbide mononitrate, and has been making dietary modifications to reduce sodium intake.  In addition to his hypertension, the patient has been experiencing constipation for the first time in his life. He has been managing this with Miralax, but notes that it has been an ongoing issue. 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      Labs   Lab Results  Component Value Date   CHOL 182 05/03/2021   HDL 96 05/03/2021   LDLCALC 65 05/03/2021   TRIG 125 05/03/2021   Lab Results  Component Value Date   NA 135 03/14/2023   K 4.6 03/14/2023   CO2 24 03/14/2023   GLUCOSE 112 (H) 03/14/2023   BUN 27 (H) 03/14/2023   CREATININE 1.30 (H) 03/14/2023   CALCIUM 8.7 (L) 03/14/2023   EGFR 51 (L) 06/29/2022   GFRNONAA 56 (L) 03/14/2023      Latest Ref Rng & Units  03/14/2023   10:33 AM 06/29/2022   10:39 AM 06/20/2022    1:33 AM  BMP  Glucose 70 - 99 mg/dL 324  401  027   BUN 8 - 23 mg/dL 27  16  52   Creatinine 0.61 - 1.24 mg/dL 2.53  6.64  4.03   BUN/Creat Ratio 10 - 24  11    Sodium 135 - 145 mmol/L 135  140  139   Potassium 3.5 - 5.1 mmol/L 4.6  4.1  4.1   Chloride 98 - 111 mmol/L 104  103  109   CO2 22 - 32 mmol/L 24  23  23    Calcium 8.9 - 10.3 mg/dL 8.7  8.6  8.4       Latest Ref Rng & Units 03/14/2023   10:33 AM 06/16/2022    8:31 PM 06/16/2022    3:48 AM  CBC  WBC 4.0 - 10.5 K/uL 6.3  7.6    Hemoglobin 13.0 - 17.0 g/dL 47.4  25.9  56.3   Hematocrit 39.0 - 52.0 % 34.5  39.9  39.0   Platelets 150 - 400 K/uL 246  240     External Labs:  NA  Review of Systems  Cardiovascular:  Positive for dyspnea on exertion (stable) and leg swelling (stable). Negative for chest pain.    Physical Exam:   VS:  BP (!) 142/72 (BP Location: Left Arm, Patient Position: Sitting, Cuff Size: Large)  Pulse 72   Resp 16   Ht 5\' 11"  (1.803 m)   Wt 248 lb (112.5 kg)   SpO2 96%   BMI 34.59 kg/m    Wt Readings from Last 3 Encounters:  07/26/23 248 lb (112.5 kg)  05/08/23 231 lb (104.8 kg)  03/21/23 240 lb (108.9 kg)     Physical Exam Constitutional:      Comments: Morbidly obese in no acute distress.  Neck:     Vascular: No carotid bruit or JVD.  Cardiovascular:     Rate and Rhythm: Normal rate and regular rhythm.     Pulses: Intact distal pulses.          Carotid pulses are 2+ on the right side and 2+ on the left side.    Heart sounds: Normal heart sounds. No murmur heard.    No gallop.  Pulmonary:     Effort: Pulmonary effort is normal.     Breath sounds: Normal breath sounds.  Abdominal:     General: Abdomen is protuberant. Bowel sounds are normal.     Palpations: Abdomen is soft.  Musculoskeletal:     Right lower leg: Edema (2+ pitting ankle) present.     Left lower leg: Edema (2+ pitting ankle) present.     Studies Reviewed: Darren [name]     ECHOCARDIOGRAM COMPLETE 04/26/2021 Normal LV systolic function with visual EF 50-55%. Left ventricle cavity is normal in size. Mild left ventricular hypertrophy. Normal global wall motion. Doppler evidence of grade II  diastolic dysfunction, normal LAP. Left atrial cavity is moderately dilated. Mild (Grade I) mitral regurgitation. Mild tricuspid regurgitation. Mild pulmonary hypertension. RVSP measures 49 mmHg. Moderate pulmonic regurgitation. IVC is dilated with a respiratory response of >50%. Compared to study 02/11/2019 normal diastolic filling pattern is now G2DD, mild LAE is now moderate, mild PR is now moderate, PHTN is new finding, otherwise no significant change. EKG:    EKG Interpretation Date/Time:  Thursday July 26 2023 08:13:20 EST Ventricular Rate:  67 PR Interval:    QRS Duration:  110 QT Interval:  430 QTC Calculation: 454 R Axis:   -52  Text Interpretation: EKG 07/26/2023: Normal sinus rhythm at rate of 67 bpm, left anterior fascicular block.  Incomplete right bundle branch block.  Low-voltage complexes. Compared to 06/16/2022, no significant change. Reconfirmed by Delrae Rend [phone]) on 07/26/2023 8:38:36 AM    EKG 05/11/2022: Sinus bradycardia at rate of 59 bpm. Left axis deviation. Left anterior fascicular block. IVCD. Compared to previous EKG on 08/10/2021, no significant change   Medications and allergies    Allergies  Allergen Reactions   Amlodipine     Leg edema   Penicillins Hives     Current Outpatient Medications:    allopurinol (ZYLOPRIM) 100 MG tablet, Take 100 mg by mouth daily. , Disp: , Rfl:    apixaban (ELIQUIS) 5 MG TABS tablet, Take 5 mg by mouth 2 (two) times daily., Disp: , Rfl:    atorvastatin (LIPITOR) 40 MG tablet, Take 40 mg by mouth daily. , Disp: , Rfl:    Cholecalciferol (VITAMIN D) 50 MCG (2000 UT) tablet, Take 2,000 Units by mouth daily., Disp: , Rfl:    colesevelam (WELCHOL) 625 MG tablet, Take 1,875 mg by mouth 2 (two) times  daily with a meal. , Disp: , Rfl:    DULERA 100-5 MCG/ACT AERO, USE 2 INHALATIONS TWICE A DAY, Disp: 39 g, Rfl: 3   fluticasone (FLONASE) 50 MCG/ACT nasal spray, Place 2 sprays into both nostrils  daily. Use for 6-8 weeks, Disp: 16 g, Rfl: 6   folic acid (FOLVITE) 1 MG tablet, Take 1 tablet (1 mg total) by mouth daily., Disp: , Rfl:    furosemide (LASIX) 40 MG tablet, Take 40 mg by mouth daily as needed for edema., Disp: , Rfl:    hydrALAZINE (APRESOLINE) 50 MG tablet, Take 1 tablet (50 mg total) by mouth 3 (three) times daily., Disp: 270 tablet, Rfl: 3   ibuprofen (ADVIL) 200 MG tablet, Take 400-600 mg by mouth every 6 (six) hours as needed for moderate pain., Disp: , Rfl:    isosorbide dinitrate (ISORDIL) 30 MG tablet, Take 1 tablet (30 mg total) by mouth 3 (three) times daily., Disp: 270 tablet, Rfl: 3   L-Methylfolate-Algae-B12-B6 (METANX) 3-90.314-2-35 MG CAPS, Take 1 tablet by mouth 2 (two) times daily. , Disp: , Rfl:    Magnesium 400 MG TABS, Take 400 mg by mouth daily at 12 noon. , Disp: , Rfl:    metoprolol tartrate (LOPRESSOR) 25 MG tablet, Take 1 tablet (25 mg total) by mouth 2 (two) times daily., Disp: 180 tablet, Rfl: 3   Multiple Vitamin (MULTIVITAMIN WITH MINERALS) TABS tablet, Take 1 tablet by mouth daily., Disp: , Rfl:    omeprazole (PRILOSEC) 20 MG capsule, Take 20 mg by mouth 2 (two) times daily before a meal., Disp: , Rfl:    Polyethyl Glycol-Propyl Glycol (SYSTANE OP), Place 1 drop into both eyes 3 (three) times daily as needed (dry eyes)., Disp: , Rfl:    thiamine (VITAMIN B-1) 100 MG tablet, Take 100 mg by mouth daily., Disp: , Rfl:    losartan (COZAAR) 25 MG tablet, Take 1 tablet (25 mg total) by mouth at bedtime., Disp: 90 tablet, Rfl: 3   ASSESSMENT AND PLAN: .      ICD-10-CM   1. Paroxysmal atrial fibrillation (HCC)  I48.0 EKG 12-Lead    2. Essential hypertension  I10 isosorbide dinitrate (ISORDIL) 30 MG tablet    3. Chronic diastolic heart failure (HCC)  Y40.34  isosorbide dinitrate (ISORDIL) 30 MG tablet    4. Chronic combined systolic and diastolic heart failure (HCC)  V42.59       1. Paroxysmal atrial fibrillation (HCC) Patient is presently maintaining sinus rhythm, he is tolerating Eliquis without bleeding diathesis.  Click Here to Calculate/Change CHADS2VASc Score The patient's CHADS2-VASc score is  , indicating a  % annual risk of stroke.  Therefore, anticoagulation is recommended.       2. Essential hypertension Blood pressure was previously well-controlled when he had lost weight.  Is gained about 10 pounds in weight over the past 6 months.  He is still trying to be careful with his calorie count and also watching salt intake.  He has not used Lasix in quite a while although does have chronic leg edema states that it is very stable and mild.  Both for chronic diastolic heart failure and also for hypertension, which is clearly related to his weight, will change isosorbide mononitrate to isosorbide dinitrate 3 times daily and presently taking hydralazine 50 mg twice daily will increase it to 3 times daily as well.  Previously we had to reduce the dose due to low blood pressure after he had lost the weight.  He would again try to lose weight.  3. Chronic diastolic heart failure (HCC) He has not had any recent hospitalizations, no clinical evidence of acute decompensated heart failure, he does have chronic leg edema.  No JVD.  Continue present medical management,  he is on an ARB losartan 25 mg daily and also on low-dose of metoprolol to tartrate 25 mg twice daily, continue the same.  Otherwise stable from cardiac standpoint I will see him back in 6 months or sooner if problems.  He has an appointment to see his PCP and will follow-up with his hypertension with him.  Signed,  Yates Decamp, MD, [facility] 07/26/2023, 8:42 AM [facility] [address] [place], [place] [address] Phone: [phone]. Fax:  [phone]

## 2023-07-26 NOTE — Patient Instructions (Addendum)
Medication Instructions:  Your physician has recommended you make the following change in your medication:  Stop Isosorbide mononitrate. Start Isosorbide dinitrate 30 mg by mouth three times daily  *If you need a refill on your cardiac medications before your next appointment, please call your pharmacy*   Lab Work: none If you have labs (blood work) drawn today and your tests are completely normal, you will receive your results only by: MyChart Message (if you have MyChart) OR A paper copy in the mail If you have any lab test that is abnormal or we need to change your treatment, we will call you to review the results.   Testing/Procedures: none   Follow-Up: At [facility], you and your health needs are our priority.  As part of our continuing mission to provide you with exceptional heart care, we have created designated Provider Care Teams.  These Care Teams include your primary Cardiologist (physician) and Advanced Practice Providers (APPs -  Physician Assistants and Nurse Practitioners) who all work together to provide you with the care you need, when you need it.  We recommend signing up for the patient portal called "MyChart".  Sign up information is provided on this After Visit Summary.  MyChart is used to connect with patients for Virtual Visits (Telemedicine).  Patients are able to view lab/test results, encounter notes, upcoming appointments, etc.  Non-urgent messages can be sent to your provider as well.   To learn more about what you can do with MyChart, go to [email or web address].    Your next appointment:   6 month(s)  Provider:   Yates Decamp, MD     Other Instructions

## 2023-08-17 ENCOUNTER — Telehealth: Payer: Self-pay | Admitting: Cardiology

## 2023-08-17 NOTE — Telephone Encounter (Signed)
*  STAT* If patient is at the pharmacy, call can be transferred to refill team.   1. Which medications need to be refilled? (please list name of each medication and dose if known)   apixaban  (ELIQUIS ) 5 MG TABS tablet   2. Would you like to learn more about the convenience, safety, & potential cost savings by using the [facility]?   3. Are you open to using the [facility] (Type [facility]. ).  4. Which pharmacy/location (including street and city if local pharmacy) is medication to be sent to?  [facility] - [place], [place] - [address]   5. Do they need a 30 day or 90 day supply?   90 day  Patient stated he still has some medication.

## 2023-08-20 ENCOUNTER — Other Ambulatory Visit: Payer: Self-pay

## 2023-08-20 MED ORDER — APIXABAN 5 MG PO TABS: 5.0000 mg | ORAL_TABLET | Freq: Two times a day (BID) | ORAL | 1 refills | Status: DC

## 2023-08-20 NOTE — Telephone Encounter (Signed)
 Prescription refill request for Eliquis  received. Indication:AFIB Last office visit:1/25 Scr:1.30  9/24 Age: 83 Weight:112.5  kg  Prescription refilled

## 2023-11-06 ENCOUNTER — Ambulatory Visit (INDEPENDENT_AMBULATORY_CARE_PROVIDER_SITE_OTHER): Payer: Medicare Other | Admitting: Otolaryngology

## 2023-11-06 ENCOUNTER — Encounter (INDEPENDENT_AMBULATORY_CARE_PROVIDER_SITE_OTHER): Payer: Self-pay

## 2023-11-06 VITALS — BP 154/81 | HR 80 | Ht 71.0 in | Wt 248.0 lb

## 2023-11-06 DIAGNOSIS — R058 Other specified cough: Secondary | ICD-10-CM | POA: Diagnosis not present

## 2023-11-06 DIAGNOSIS — R490 Dysphonia: Secondary | ICD-10-CM | POA: Diagnosis not present

## 2023-11-06 DIAGNOSIS — R0982 Postnasal drip: Secondary | ICD-10-CM

## 2023-11-06 DIAGNOSIS — R0989 Other specified symptoms and signs involving the circulatory and respiratory systems: Secondary | ICD-10-CM

## 2023-11-06 DIAGNOSIS — R0981 Nasal congestion: Secondary | ICD-10-CM | POA: Diagnosis not present

## 2023-11-06 MED ORDER — AZELASTINE HCL 0.1 % NA SOLN: 2.0000 | Freq: Two times a day (BID) | NASAL | 12 refills | Status: AC

## 2023-11-06 NOTE — Patient Instructions (Signed)
 Use flonase  two sprays each nostril twice per day; right after, use astelin nasal spray two sprays each nostril twice per day. Back to back.

## 2023-11-06 NOTE — Progress Notes (Signed)
 Dear Dr. Cricket Doll, Here is my assessment for our mutual patient, Darren [name]. Thank you for allowing me the opportunity to care for your patient. Please do not hesitate to contact me should you have any other questions. Sincerely, Dr. Milon Aloe  Otolaryngology Clinic Note Referring provider: Dr. Cricket Doll HPI:  Darren [name] is a 83 y.o. male kindly referred by Dr. Cricket Doll for evaluation of right ear fullness and chronic cough.  Initial visit (04/2023): Followed by Dr. Waymond Hailey for several years, and he reports that the cough is intermittent and clearing his throat. Some PND. No significant sinonasal history/CRS symptoms or sinus infections.   He reports that the cough is annoying, and he also has some intermittent dysphonia. He clears his throat often. He reports that he does not know a trigger for the cough. Nothing seems to make things worse.   He has tried medication changes (Entresto ), Omeprazole , prior trial of Gabapentin , and Dulera . He has not used any nasal sprays - has used flonase , but not astelin.   Reports no prior Abx/steroid recently, but has had prednisone  prior. No trouble swallowing, no odynophagia, dysphagia, ear pain, SOB.  Right ear fullness: intermittent, can pop his ears, no subjective HL, no significant vertigo or other otologic history. When his ears pop, he feels much better --------------------------------------------------------- 11/06/2023 He reports that he has been doing better from nasal standpoint and cough. He used flonase  and then stopped it, but noticed his congestion and throat clearing and cough became worse, so he started to use it again. He does report that he continues to have some PND and some intermittent cough with a tickle. Dysphonia is better. Trying to use breathing strategies for this. He reports that his CPAP use has helped reduce morning-time cough. No frequent sinus infections or cardinal CRS sx. He has not used astelin. He tried gabapentin  but  reports it did not help his cough.  ---------------------------------------------------------  PMHx: COPD, HTN, A-fib on eliquis , HLD, prior AKI. No strokes or CAD  H&N Surgery: recent MOHS surgery - nasal Personal or FHx of bleeding dz or anesthesia difficulty: no  Independent Review of Additional Tests or Records:  Dr. Jacqui Mau notes and PFTs and other results independently reviewed: with cough that resolved and pattern of recurrent rhinitis since childhood spring > fall with onset of recurrent flares of cough / wheezing around 2005 resolves from days to weeks p prednisone  cycle needing up to 4 x on avg  referred to pulmonary clinic 03/30/2016 by Dr   Dr Cricket Doll  Dyspnea:  still going to gym 3 x per week not much treadmill ex-  can walk to stop sign 100 yards sev times a week s sob.  Cough: sense of pnds / globus sensation > clear mucus minimal  Sleeping: on cpap / lots of sneezing  Takes omeprazole ; prior gabapentin  use  Referred to ENT 03/16/2023 for rhinitis with R ear wax impaction as well    Rhinitis has been historically poorly controlled assoc with Upper airway cough syndrome (previously labeled PNDS),  is so named because it's frequently impossible to sort out how much is  CR/sinusitis with freq throat clearing (which can be related to primary GERD)   vs  causing  secondary (" extra esophageal")  GERD from wide swings in gastric pressure that occur with throat clearing, often  promoting self use of mint and menthol lozenges that reduce the lower esophageal sphincter tone and exacerbate the problem further in a cyclical fashion.    These are the same  pts (now being labeled as having "irritable larynx syndrome" by some cough centers) who not infrequently have a history of having failed to tolerate ace inhibitors,  dry powder inhalers or biphosphonates or report having atypical/extraesophageal reflux symptoms that don't respond to standard doses of PPI  and are easily confused as having aecopd  or asthma flares by even experienced allergists/ pulmonologists (myself included).       - Allergy  profile 05/10/2016 >  Eos 0.4 /  IgE 60 Pos Grass/ cedar trees   - 05/10/2016 changed back to 4 weeks of symb 80 p flared on advair - FENO 06/07/2016  =   10 on symb 80 2bid  - Spirometry 06/07/2016  FEV1 1.89 (58%)  Ratio  71   - 09/06/2016   try dulera  200 2bid - 11/28/2016 excess shaking on duelra 200 2bid so try one bid and off gerd rx since is not convinced worked - Sinus CT 11/29/2016 >>> Mucosal edema in the maxillary and ethmoid sinuses bilaterally. No air-fluid level. - Singulair  10 mg started first week in Sept 2018   - 06/2022 stopped entresto  > cough improved   CT Sinus (2019): limited; some max and ethmoid disease    PMH/Meds/All/SocHx/FamHx/ROS:   Past Medical History:  Diagnosis Date   Arthritis    "maybe a little"   Asthma    "mild"   Atrial fibrillation (HCC)    Atypical mole 08/16/2011   left upper back tx exc atypical prol.   Atypical mole 12/18/2012   solar lentigo atypical tx w/s   bilateral leg edema 09/11/2018   Cancer (HCC)    prostate cancer   CHF (congestive heart failure) (HCC)    resolved with low salt diet   Dyspnea    Dyspnea on exertion 09/11/2018   Dysrhythmia    Afib with rapid ventricular   Essential hypertension 07/25/2018   Fatty liver    GERD (gastroesophageal reflux disease)    GI bleed 2018   Gout    History of blood transfusion 2018   Obstructive sleep apnea 09/11/2018   OSA (obstructive sleep apnea)    Paroxysmal atrial fibrillation (HCC) 09/11/2018   Pre-diabetes      Past Surgical History:  Procedure Laterality Date   CARDIOVERSION N/A 10/08/2018   Procedure: CARDIOVERSION;  Surgeon: Knox Perl, MD;  Location: [facility] ENDOSCOPY;  Service: Cardiovascular;  Laterality: N/A;   CARDIOVERSION N/A 01/17/2019   Procedure: CARDIOVERSION;  Surgeon: Knox Perl, MD;  Location: [facility] ENDOSCOPY;  Service: Cardiovascular;  Laterality: N/A;    COLONOSCOPY Left 03/10/2017   Procedure: COLONOSCOPY;  Surgeon: Evangeline Hilts, MD;  Location: [facility] ENDOSCOPY;  Service: Endoscopy;  Laterality: Left;   ESOPHAGOGASTRODUODENOSCOPY (EGD) WITH PROPOFOL  Left 03/09/2017   Procedure: ESOPHAGOGASTRODUODENOSCOPY (EGD) WITH PROPOFOL ;  Surgeon: Genell Ken, MD;  Location: [facility] ENDOSCOPY;  Service: Gastroenterology;  Laterality: Left;   EXCISION MASS HEAD Right 03/21/2023   Procedure: EXCISION OF NOSE MOHS DEFECT WITH REPAIR;  Surgeon: Rush Coupe, MD;  Location: [facility] OR;  Service: ENT;  Laterality: Right;   EYE SURGERY Bilateral    cataract    FINGER SURGERY Right    5th   Prostate     DaVinci   SKIN FULL THICKNESS GRAFT Right 03/21/2023   Procedure: ADJACENT TISSUE TRANSFER;  Surgeon: Rush Coupe, MD;  Location: [facility] OR;  Service: ENT;  Laterality: Right;    Family History  Problem Relation Age of Onset   Emphysema Father        smoked  Social Connections: Not on file      Current Outpatient Medications:    allopurinol  (ZYLOPRIM ) 100 MG tablet, Take 100 mg by mouth daily. , Disp: , Rfl:    apixaban  (ELIQUIS ) 5 MG TABS tablet, Take 1 tablet (5 mg total) by mouth 2 (two) times daily., Disp: 180 tablet, Rfl: 1   atorvastatin  (LIPITOR) 40 MG tablet, Take 40 mg by mouth daily. , Disp: , Rfl:    azelastine (ASTELIN) 0.1 % nasal spray, Place 2 sprays into both nostrils 2 (two) times daily. Use in each nostril as directed, Disp: 30 mL, Rfl: 12   Cholecalciferol (VITAMIN D) 50 MCG (2000 UT) tablet, Take 2,000 Units by mouth daily., Disp: , Rfl:    colesevelam (WELCHOL) 625 MG tablet, Take 1,875 mg by mouth 2 (two) times daily with a meal. , Disp: , Rfl:    DULERA  100-5 MCG/ACT AERO, USE 2 INHALATIONS TWICE A DAY, Disp: 39 g, Rfl: 3   fluticasone  (FLONASE ) 50 MCG/ACT nasal spray, Place 2 sprays into both nostrils daily. Use for 6-8 weeks, Disp: 16 g, Rfl: 6   folic acid  (FOLVITE ) 1 MG tablet, Take 1 tablet (1 mg total) by mouth daily., Disp: , Rfl:     furosemide  (LASIX ) 40 MG tablet, Take 40 mg by mouth daily as needed for edema., Disp: , Rfl:    hydrALAZINE  (APRESOLINE ) 50 MG tablet, Take 1 tablet (50 mg total) by mouth 3 (three) times daily., Disp: 270 tablet, Rfl: 3   ibuprofen (ADVIL) 200 MG tablet, Take 400-600 mg by mouth every 6 (six) hours as needed for moderate pain., Disp: , Rfl:    isosorbide  dinitrate (ISORDIL ) 30 MG tablet, Take 1 tablet (30 mg total) by mouth 3 (three) times daily., Disp: 270 tablet, Rfl: 3   L-Methylfolate-Algae-B12-B6 (METANX) 3-90.314-2-35 MG CAPS, Take 1 tablet by mouth 2 (two) times daily. , Disp: , Rfl:    Magnesium 400 MG TABS, Take 400 mg by mouth daily at 12 noon. , Disp: , Rfl:    metoprolol  tartrate (LOPRESSOR ) 25 MG tablet, Take 1 tablet (25 mg total) by mouth 2 (two) times daily., Disp: 180 tablet, Rfl: 3   Multiple Vitamin (MULTIVITAMIN WITH MINERALS) TABS tablet, Take 1 tablet by mouth daily., Disp: , Rfl:    omeprazole  (PRILOSEC) 20 MG capsule, Take 20 mg by mouth 2 (two) times daily before a meal., Disp: , Rfl:    Polyethyl Glycol-Propyl Glycol (SYSTANE OP), Place 1 drop into both eyes 3 (three) times daily as needed (dry eyes)., Disp: , Rfl:    thiamine  (VITAMIN B-1) 100 MG tablet, Take 100 mg by mouth daily., Disp: , Rfl:    losartan  (COZAAR ) 25 MG tablet, Take 1 tablet (25 mg total) by mouth at bedtime., Disp: 90 tablet, Rfl: 3   Physical Exam:   BP (!) 154/81 (BP Location: Right Arm, Patient Position: Sitting, Cuff Size: Large)   Pulse 80   Ht 5\' 11"  (1.803 m)   Wt 248 lb (112.5 kg)   SpO2 90%   BMI 34.59 kg/m    Salient findings:  CN II-XII intact  Bilateral EAC clear and TM intact with well pneumatized middle ear spaces Weber 512: midline Anterior rhinoscopy: Septum relatively mildline; bilateral inferior turbinates with mild hypertrophy; right nasal alar subunit reconstruction well healed No lesions of oral cavity/oropharynx No obviously palpable neck  masses/lymphadenopathy/thyromegaly No respiratory distress or stridor; voice quality class 2 - very infrequent throat clearing; given complaints regarding persistent chronic cough and  dysphonia and throat clearing, flexible laryngoscopy indicated for further evaluation  Procedures:  Procedure Note Pre-procedure diagnosis:  Dysphonia, throat clearing, chronic cough Post-procedure diagnosis: Same Procedure: Transnasal Fiberoptic Laryngoscopy, CPT 69629 - Mod 25 Indication: see above Complications: None apparent EBL: 0 mL Date: 11/06/23   The procedure was undertaken to further evaluate the patient's complaint of Dysphonia, throat clearing, chronic cough, with mirror exam inadequate for appropriate examination due to gag reflex and poor patient tolerance  Procedure:  Patient was identified as correct patient. Verbal consent was obtained. The nose was sprayed with oxymetazoline and 4% lidocaine . The The flexible laryngoscope was passed through the nose to view the nasal cavity, pharynx (oropharynx, hypopharynx) and larynx.  The larynx was examined at rest and during multiple phonatory tasks. Documentation was obtained and reviewed with patient. The scope was removed. The patient tolerated the procedure well.  Findings: The nasal cavity and nasopharynx did not reveal any masses or lesions, mucosa appeared to be without obvious lesions. The tongue base, pharyngeal walls, piriform sinuses, vallecula, epiglottis and postcricoid region are normal in appearance. The visualized portion of the subglottis and proximal trachea is widely patent. The vocal folds are mobile bilaterally. There are no lesions on the free edge of the vocal folds nor elsewhere in the larynx worrisome for malignancy.  No significant retained secretions bilateral pyriform. Age appropriate b/l VF atrophy     Electronically signed by: Evelina Hippo, MD 11/06/2023 11:04 AM   Impression & Plans:  Tye Brenneman is a 83 y.o. male with  h/o  COPD, HTN, A-fib on eliquis , HLD, CKD now with:  1. Throat clearing   2. Upper airway cough syndrome   3. Dysphonia   4. Post-nasal drip   5. Nasal congestion    Appears to be consistent with upper airway cough syndrome given nature of complaint and improvement with prior gabapentin .He's already on PPI for GERD. TFL again reassuring. Of note, he did not tolerate gabapentin  well this time so will discontinue that. Fortunately, he has improved with flonase  and cough suppression strategies. We again discussed therapy but he declined.  For Post nasal drip and some allergic rhinitis symptoms, improved with flonase . He is having some intermittent cough and PND and given improvement with flonase  prior, will start astelin BID as well to see if it improves further.   He does not wish to do anything invasive or extensive. This may be reasonable place to start. Not having any sinonasal infections so will avoid any further intervention from that standpoint.  Meds ordered this encounter  Medications   azelastine (ASTELIN) 0.1 % nasal spray    Sig: Place 2 sprays into both nostrils 2 (two) times daily. Use in each nostril as directed    Dispense:  30 mL    Refill:  12    - f/u 1 year   Thank you for allowing me the opportunity to care for your patient. Please do not hesitate to contact me should you have any other questions.  Sincerely, Milon Aloe, MD Otolarynoglogist (ENT), [facility] Phone: [phone] Fax: [phone]  11/06/2023, 11:04 AM    MDM:  Level 4: 99214 Complexity/Problems addressed: mod - chronic problems, stable or improving Data complexity: low - Morbidity: mod  - Prescription Drug prescribed or managed: yes

## 2023-11-15 ENCOUNTER — Other Ambulatory Visit: Payer: Self-pay | Admitting: Internal Medicine

## 2023-11-15 DIAGNOSIS — R188 Other ascites: Secondary | ICD-10-CM

## 2023-11-15 DIAGNOSIS — R600 Localized edema: Secondary | ICD-10-CM

## 2023-11-22 ENCOUNTER — Ambulatory Visit
Admission: RE | Admit: 2023-11-22 | Discharge: 2023-11-22 | Disposition: A | Discharge status: Home / Self Care | Source: Ambulatory Visit | Attending: Internal Medicine | Admitting: Internal Medicine

## 2023-11-22 DIAGNOSIS — R600 Localized edema: Secondary | ICD-10-CM

## 2023-11-22 DIAGNOSIS — R188 Other ascites: Secondary | ICD-10-CM

## 2023-12-14 ENCOUNTER — Other Ambulatory Visit (HOSPITAL_COMMUNITY): Payer: Self-pay | Admitting: Internal Medicine

## 2023-12-14 DIAGNOSIS — K7031 Alcoholic cirrhosis of liver with ascites: Secondary | ICD-10-CM

## 2023-12-14 MED ORDER — ALBUMIN HUMAN 25 % IV SOLN: 25.0000 g | Freq: Once | INTRAVENOUS | Status: DC

## 2023-12-18 ENCOUNTER — Ambulatory Visit (HOSPITAL_COMMUNITY)
Admission: RE | Admit: 2023-12-18 | Discharge: 2023-12-18 | Disposition: A | Discharge status: Home / Self Care | Source: Ambulatory Visit | Attending: Internal Medicine | Admitting: Internal Medicine

## 2023-12-18 DIAGNOSIS — K7460 Unspecified cirrhosis of liver: Secondary | ICD-10-CM | POA: Insufficient documentation

## 2023-12-18 DIAGNOSIS — R188 Other ascites: Secondary | ICD-10-CM | POA: Diagnosis not present

## 2023-12-18 DIAGNOSIS — K7031 Alcoholic cirrhosis of liver with ascites: Secondary | ICD-10-CM

## 2023-12-18 HISTORY — PX: IR PARACENTESIS: IMG2679

## 2023-12-18 LAB — ALBUMIN, PLEURAL OR PERITONEAL FLUID: Albumin, Fluid: <1.5 g/dL

## 2023-12-18 LAB — BODY FLUID CELL COUNT WITH DIFFERENTIAL
Eos, Fluid: 1 %
Lymphs, Fluid: 6 %
Monocyte-Macrophage-Serous Fluid: 92 % — ABNORMAL HIGH (ref 50–90)
Neutrophil Count, Fluid: 1 % (ref 0–25)
Total Nucleated Cell Count, Fluid: 499 uL (ref 0–1000)

## 2023-12-18 LAB — GLUCOSE, PLEURAL OR PERITONEAL FLUID: Glucose, Fluid: 92 mg/dL

## 2023-12-18 LAB — PROTEIN, PLEURAL OR PERITONEAL FLUID: Total protein, fluid: 3.6 g/dL

## 2023-12-18 MED ORDER — LIDOCAINE HCL 1 % IJ SOLN: 20.0000 mL | Freq: Once | INTRAMUSCULAR | Status: DC

## 2023-12-19 LAB — PATHOLOGIST SMEAR REVIEW

## 2024-01-07 ENCOUNTER — Other Ambulatory Visit (HOSPITAL_COMMUNITY): Payer: Self-pay | Admitting: Gastroenterology

## 2024-01-07 DIAGNOSIS — K7460 Unspecified cirrhosis of liver: Secondary | ICD-10-CM

## 2024-01-07 DIAGNOSIS — R188 Other ascites: Secondary | ICD-10-CM

## 2024-01-08 ENCOUNTER — Ambulatory Visit (HOSPITAL_COMMUNITY)
Admission: RE | Admit: 2024-01-08 | Discharge: 2024-01-08 | Disposition: A | Discharge status: Home / Self Care | Source: Ambulatory Visit | Attending: Gastroenterology | Admitting: Gastroenterology

## 2024-01-08 DIAGNOSIS — R188 Other ascites: Secondary | ICD-10-CM | POA: Insufficient documentation

## 2024-01-08 DIAGNOSIS — K7460 Unspecified cirrhosis of liver: Secondary | ICD-10-CM | POA: Diagnosis present

## 2024-01-08 HISTORY — PX: IR PARACENTESIS: IMG2679

## 2024-01-08 MED ORDER — LIDOCAINE HCL 1 % IJ SOLN
INTRAMUSCULAR | Status: AC
Start: 2024-01-08 — End: 2024-01-08
  Filled 2024-01-08: qty 20

## 2024-01-08 MED ORDER — ALBUMIN HUMAN 25 % IV SOLN
INTRAVENOUS | Status: AC
  Filled 2024-01-08: qty 200

## 2024-01-08 MED ORDER — ALBUMIN HUMAN 25 % IV SOLN
50.0000 g | Freq: Once | INTRAVENOUS | Status: AC
  Administered 2024-01-08: 50 g via INTRAVENOUS

## 2024-01-10 ENCOUNTER — Other Ambulatory Visit (INDEPENDENT_AMBULATORY_CARE_PROVIDER_SITE_OTHER): Payer: Self-pay | Admitting: Otolaryngology

## 2024-01-30 ENCOUNTER — Other Ambulatory Visit: Payer: Self-pay | Admitting: Cardiology

## 2024-01-30 NOTE — Telephone Encounter (Signed)
 Prescription refill request for Eliquis  received. Indication: PAF Last office visit: 07/26/23  JINNY Bergamo MD Scr: 1.30 on 03/14/23  Epic Age: 83 Weight: 112.5kg  Based on above findings Eliquis  5mg  twice daily is the appropriate dose.  Refill approved.

## 2024-02-07 ENCOUNTER — Other Ambulatory Visit (HOSPITAL_COMMUNITY): Payer: Self-pay | Admitting: Gastroenterology

## 2024-02-07 DIAGNOSIS — R188 Other ascites: Secondary | ICD-10-CM

## 2024-02-07 DIAGNOSIS — K7460 Unspecified cirrhosis of liver: Secondary | ICD-10-CM

## 2024-02-11 ENCOUNTER — Ambulatory Visit (HOSPITAL_COMMUNITY)
Admission: RE | Admit: 2024-02-11 | Discharge: 2024-02-11 | Disposition: A | Discharge status: Home / Self Care | Source: Ambulatory Visit | Attending: Gastroenterology | Admitting: Gastroenterology

## 2024-02-11 DIAGNOSIS — K7460 Unspecified cirrhosis of liver: Secondary | ICD-10-CM | POA: Insufficient documentation

## 2024-02-11 DIAGNOSIS — R188 Other ascites: Secondary | ICD-10-CM | POA: Diagnosis not present

## 2024-02-11 HISTORY — PX: IR PARACENTESIS: IMG2679

## 2024-02-11 MED ORDER — ALBUMIN HUMAN 25 % IV SOLN
INTRAVENOUS | Status: AC
  Filled 2024-02-11: qty 50

## 2024-02-11 MED ORDER — ALBUMIN HUMAN 25 % IV SOLN
50.0000 g | Freq: Once | INTRAVENOUS | Status: AC
  Administered 2024-02-11: 50 g via INTRAVENOUS

## 2024-02-11 MED ORDER — LIDOCAINE HCL 1 % IJ SOLN
20.0000 mL | Freq: Once | INTRAMUSCULAR | Status: AC
  Administered 2024-02-11: 10 mL

## 2024-02-11 MED ORDER — LIDOCAINE-EPINEPHRINE 1 %-1:100000 IJ SOLN
INTRAMUSCULAR | Status: AC
Start: 2024-02-11 — End: 2024-02-11
  Filled 2024-02-11: qty 1

## 2024-02-11 NOTE — Procedures (Signed)
 PROCEDURE SUMMARY:  Successful image-guided paracentesis from the left abdomen.  Yielded 4.9 liters of amber fluid.  No immediate complications.  EBL: zero Patient tolerated well.   Specimen not sent for labs.  Please see imaging section of Epic for full dictation.  Laverle Pillard NP 02/11/2024 9:29 AM

## 2024-02-14 ENCOUNTER — Ambulatory Visit (HOSPITAL_COMMUNITY)
Admission: RE | Admit: 2024-02-14 | Discharge: 2024-02-14 | Disposition: A | Discharge status: Home / Self Care | Source: Ambulatory Visit | Attending: Gastroenterology | Admitting: Gastroenterology

## 2024-02-14 DIAGNOSIS — K7460 Unspecified cirrhosis of liver: Secondary | ICD-10-CM | POA: Insufficient documentation

## 2024-03-06 ENCOUNTER — Other Ambulatory Visit: Payer: Self-pay | Admitting: Cardiology

## 2024-03-10 ENCOUNTER — Other Ambulatory Visit: Payer: Self-pay | Admitting: Internal Medicine

## 2024-03-11 ENCOUNTER — Other Ambulatory Visit (HOSPITAL_COMMUNITY): Payer: Self-pay | Admitting: Gastroenterology

## 2024-03-11 DIAGNOSIS — R188 Other ascites: Secondary | ICD-10-CM

## 2024-03-11 DIAGNOSIS — K7460 Unspecified cirrhosis of liver: Secondary | ICD-10-CM

## 2024-03-13 ENCOUNTER — Ambulatory Visit (HOSPITAL_COMMUNITY)
Admission: RE | Admit: 2024-03-13 | Discharge: 2024-03-13 | Disposition: A | Discharge status: Home / Self Care | Source: Ambulatory Visit | Attending: Gastroenterology | Admitting: Gastroenterology

## 2024-03-13 DIAGNOSIS — R188 Other ascites: Secondary | ICD-10-CM | POA: Insufficient documentation

## 2024-03-13 DIAGNOSIS — K7460 Unspecified cirrhosis of liver: Secondary | ICD-10-CM | POA: Diagnosis present

## 2024-03-13 HISTORY — PX: IR PARACENTESIS: IMG2679

## 2024-03-13 MED ORDER — LIDOCAINE-EPINEPHRINE 1 %-1:100000 IJ SOLN
20.0000 mL | Freq: Once | INTRAMUSCULAR | Status: AC
  Administered 2024-03-13: 10 mL

## 2024-03-13 MED ORDER — ALBUMIN HUMAN 25 % IV SOLN
50.0000 g | Freq: Once | INTRAVENOUS | Status: AC
  Administered 2024-03-13: 50 g via INTRAVENOUS

## 2024-03-13 MED ORDER — ALBUMIN HUMAN 25 % IV SOLN
INTRAVENOUS | Status: AC
  Filled 2024-03-13: qty 200

## 2024-03-13 MED ORDER — LIDOCAINE-EPINEPHRINE 1 %-1:100000 IJ SOLN
INTRAMUSCULAR | Status: AC
  Filled 2024-03-13: qty 1

## 2024-03-13 NOTE — Procedures (Signed)
 PROCEDURE SUMMARY:  Successful image-guided paracentesis from the left abdomen.  Yielded 5.3 liters of yellow fluid.  No immediate complications.  EBL: zero Patient tolerated well.   Specimen not sent for labs.  Please see imaging section of Epic for full dictation.  Ashmi Blas NP 03/13/2024 3:19 PM

## 2024-03-22 ENCOUNTER — Inpatient Hospital Stay (HOSPITAL_COMMUNITY)

## 2024-03-22 ENCOUNTER — Emergency Department (HOSPITAL_COMMUNITY)

## 2024-03-22 ENCOUNTER — Other Ambulatory Visit (HOSPITAL_COMMUNITY)

## 2024-03-22 ENCOUNTER — Encounter (HOSPITAL_COMMUNITY): Payer: Self-pay | Admitting: Internal Medicine

## 2024-03-22 ENCOUNTER — Inpatient Hospital Stay (HOSPITAL_COMMUNITY)
Admission: EM | Admit: 2024-03-22 | Discharge: 2024-04-02 | DRG: 871 | Disposition: A | Discharge status: Hospice | Attending: Internal Medicine | Admitting: Internal Medicine

## 2024-03-22 DIAGNOSIS — I4891 Unspecified atrial fibrillation: Secondary | ICD-10-CM | POA: Diagnosis not present

## 2024-03-22 DIAGNOSIS — I10 Essential (primary) hypertension: Secondary | ICD-10-CM | POA: Diagnosis present

## 2024-03-22 DIAGNOSIS — E860 Dehydration: Secondary | ICD-10-CM | POA: Diagnosis present

## 2024-03-22 DIAGNOSIS — L89152 Pressure ulcer of sacral region, stage 2: Secondary | ICD-10-CM | POA: Diagnosis present

## 2024-03-22 DIAGNOSIS — E8809 Other disorders of plasma-protein metabolism, not elsewhere classified: Secondary | ICD-10-CM | POA: Diagnosis present

## 2024-03-22 DIAGNOSIS — E43 Unspecified severe protein-calorie malnutrition: Secondary | ICD-10-CM | POA: Diagnosis present

## 2024-03-22 DIAGNOSIS — R6510 Systemic inflammatory response syndrome (SIRS) of non-infectious origin without acute organ dysfunction: Secondary | ICD-10-CM | POA: Diagnosis not present

## 2024-03-22 DIAGNOSIS — R54 Age-related physical debility: Secondary | ICD-10-CM | POA: Diagnosis present

## 2024-03-22 DIAGNOSIS — G9340 Encephalopathy, unspecified: Secondary | ICD-10-CM | POA: Diagnosis not present

## 2024-03-22 DIAGNOSIS — I480 Paroxysmal atrial fibrillation: Secondary | ICD-10-CM | POA: Diagnosis present

## 2024-03-22 DIAGNOSIS — Z880 Allergy status to penicillin: Secondary | ICD-10-CM

## 2024-03-22 DIAGNOSIS — E872 Acidosis, unspecified: Secondary | ICD-10-CM | POA: Diagnosis present

## 2024-03-22 DIAGNOSIS — Z888 Allergy status to other drugs, medicaments and biological substances status: Secondary | ICD-10-CM

## 2024-03-22 DIAGNOSIS — J4489 Other specified chronic obstructive pulmonary disease: Secondary | ICD-10-CM | POA: Diagnosis present

## 2024-03-22 DIAGNOSIS — R7303 Prediabetes: Secondary | ICD-10-CM | POA: Diagnosis present

## 2024-03-22 DIAGNOSIS — Z6824 Body mass index (BMI) 24.0-24.9, adult: Secondary | ICD-10-CM

## 2024-03-22 DIAGNOSIS — M109 Gout, unspecified: Secondary | ICD-10-CM | POA: Diagnosis present

## 2024-03-22 DIAGNOSIS — N189 Chronic kidney disease, unspecified: Secondary | ICD-10-CM | POA: Diagnosis not present

## 2024-03-22 DIAGNOSIS — J9601 Acute respiratory failure with hypoxia: Secondary | ICD-10-CM | POA: Diagnosis not present

## 2024-03-22 DIAGNOSIS — R188 Other ascites: Secondary | ICD-10-CM | POA: Diagnosis not present

## 2024-03-22 DIAGNOSIS — E875 Hyperkalemia: Secondary | ICD-10-CM | POA: Diagnosis present

## 2024-03-22 DIAGNOSIS — Z79899 Other long term (current) drug therapy: Secondary | ICD-10-CM

## 2024-03-22 DIAGNOSIS — E871 Hypo-osmolality and hyponatremia: Secondary | ICD-10-CM | POA: Diagnosis present

## 2024-03-22 DIAGNOSIS — I130 Hypertensive heart and chronic kidney disease with heart failure and stage 1 through stage 4 chronic kidney disease, or unspecified chronic kidney disease: Secondary | ICD-10-CM | POA: Diagnosis present

## 2024-03-22 DIAGNOSIS — Z992 Dependence on renal dialysis: Secondary | ICD-10-CM | POA: Diagnosis not present

## 2024-03-22 DIAGNOSIS — R4182 Altered mental status, unspecified: Secondary | ICD-10-CM | POA: Diagnosis not present

## 2024-03-22 DIAGNOSIS — K7031 Alcoholic cirrhosis of liver with ascites: Secondary | ICD-10-CM | POA: Diagnosis present

## 2024-03-22 DIAGNOSIS — Z8546 Personal history of malignant neoplasm of prostate: Secondary | ICD-10-CM

## 2024-03-22 DIAGNOSIS — L89150 Pressure ulcer of sacral region, unstageable: Secondary | ICD-10-CM | POA: Diagnosis present

## 2024-03-22 DIAGNOSIS — Z87891 Personal history of nicotine dependence: Secondary | ICD-10-CM

## 2024-03-22 DIAGNOSIS — R6520 Severe sepsis without septic shock: Secondary | ICD-10-CM | POA: Diagnosis present

## 2024-03-22 DIAGNOSIS — E861 Hypovolemia: Secondary | ICD-10-CM | POA: Diagnosis present

## 2024-03-22 DIAGNOSIS — G4733 Obstructive sleep apnea (adult) (pediatric): Secondary | ICD-10-CM | POA: Diagnosis not present

## 2024-03-22 DIAGNOSIS — J449 Chronic obstructive pulmonary disease, unspecified: Secondary | ICD-10-CM | POA: Diagnosis not present

## 2024-03-22 DIAGNOSIS — R197 Diarrhea, unspecified: Secondary | ICD-10-CM | POA: Diagnosis not present

## 2024-03-22 DIAGNOSIS — Z7951 Long term (current) use of inhaled steroids: Secondary | ICD-10-CM

## 2024-03-22 DIAGNOSIS — K767 Hepatorenal syndrome: Secondary | ICD-10-CM | POA: Diagnosis present

## 2024-03-22 DIAGNOSIS — Z515 Encounter for palliative care: Secondary | ICD-10-CM

## 2024-03-22 DIAGNOSIS — D500 Iron deficiency anemia secondary to blood loss (chronic): Secondary | ICD-10-CM | POA: Insufficient documentation

## 2024-03-22 DIAGNOSIS — K652 Spontaneous bacterial peritonitis: Secondary | ICD-10-CM | POA: Diagnosis present

## 2024-03-22 DIAGNOSIS — K7581 Nonalcoholic steatohepatitis (NASH): Secondary | ICD-10-CM | POA: Diagnosis present

## 2024-03-22 DIAGNOSIS — D509 Iron deficiency anemia, unspecified: Secondary | ICD-10-CM | POA: Diagnosis present

## 2024-03-22 DIAGNOSIS — D75839 Thrombocytosis, unspecified: Secondary | ICD-10-CM | POA: Diagnosis present

## 2024-03-22 DIAGNOSIS — A419 Sepsis, unspecified organism: Secondary | ICD-10-CM | POA: Diagnosis present

## 2024-03-22 DIAGNOSIS — N179 Acute kidney failure, unspecified: Secondary | ICD-10-CM | POA: Diagnosis present

## 2024-03-22 DIAGNOSIS — Z66 Do not resuscitate: Secondary | ICD-10-CM | POA: Diagnosis present

## 2024-03-22 DIAGNOSIS — G9349 Other encephalopathy: Secondary | ICD-10-CM | POA: Diagnosis present

## 2024-03-22 DIAGNOSIS — Z7901 Long term (current) use of anticoagulants: Secondary | ICD-10-CM

## 2024-03-22 DIAGNOSIS — I5032 Chronic diastolic (congestive) heart failure: Secondary | ICD-10-CM | POA: Diagnosis present

## 2024-03-22 DIAGNOSIS — I371 Nonrheumatic pulmonary valve insufficiency: Secondary | ICD-10-CM | POA: Diagnosis present

## 2024-03-22 DIAGNOSIS — N19 Unspecified kidney failure: Secondary | ICD-10-CM

## 2024-03-22 DIAGNOSIS — K7460 Unspecified cirrhosis of liver: Secondary | ICD-10-CM

## 2024-03-22 DIAGNOSIS — D631 Anemia in chronic kidney disease: Secondary | ICD-10-CM | POA: Diagnosis present

## 2024-03-22 DIAGNOSIS — Z1152 Encounter for screening for COVID-19: Secondary | ICD-10-CM

## 2024-03-22 DIAGNOSIS — K219 Gastro-esophageal reflux disease without esophagitis: Secondary | ICD-10-CM | POA: Diagnosis present

## 2024-03-22 DIAGNOSIS — Z7189 Other specified counseling: Secondary | ICD-10-CM | POA: Diagnosis not present

## 2024-03-22 DIAGNOSIS — R627 Adult failure to thrive: Secondary | ICD-10-CM | POA: Diagnosis present

## 2024-03-22 DIAGNOSIS — L8990 Pressure ulcer of unspecified site, unspecified stage: Secondary | ICD-10-CM | POA: Insufficient documentation

## 2024-03-22 DIAGNOSIS — Z711 Person with feared health complaint in whom no diagnosis is made: Secondary | ICD-10-CM | POA: Diagnosis not present

## 2024-03-22 DIAGNOSIS — Z825 Family history of asthma and other chronic lower respiratory diseases: Secondary | ICD-10-CM

## 2024-03-22 DIAGNOSIS — N1832 Chronic kidney disease, stage 3b: Secondary | ICD-10-CM | POA: Diagnosis present

## 2024-03-22 DIAGNOSIS — R791 Abnormal coagulation profile: Secondary | ICD-10-CM | POA: Diagnosis present

## 2024-03-22 DIAGNOSIS — E7800 Pure hypercholesterolemia, unspecified: Secondary | ICD-10-CM | POA: Diagnosis present

## 2024-03-22 LAB — CBC WITH DIFFERENTIAL/PLATELET
Abs Immature Granulocytes: 0.16 K/uL — ABNORMAL HIGH (ref 0.00–0.07)
Basophils Absolute: 0 K/uL (ref 0.0–0.1)
Basophils Relative: 0 %
Eosinophils Absolute: 0 K/uL (ref 0.0–0.5)
Eosinophils Relative: 0 %
HCT: 35.8 % — ABNORMAL LOW (ref 39.0–52.0)
Hemoglobin: 11 g/dL — ABNORMAL LOW (ref 13.0–17.0)
Immature Granulocytes: 1 %
Lymphocytes Relative: 1 %
Lymphs Abs: 0.2 K/uL — ABNORMAL LOW (ref 0.7–4.0)
MCH: 27.6 pg (ref 26.0–34.0)
MCHC: 30.7 g/dL (ref 30.0–36.0)
MCV: 89.9 fL (ref 80.0–100.0)
Monocytes Absolute: 0.7 K/uL (ref 0.1–1.0)
Monocytes Relative: 4 %
Neutro Abs: 17.5 K/uL — ABNORMAL HIGH (ref 1.7–7.7)
Neutrophils Relative %: 94 %
Platelets: 720 K/uL — ABNORMAL HIGH (ref 150–400)
RBC: 3.98 MIL/uL — ABNORMAL LOW (ref 4.22–5.81)
RDW: 17.1 % — ABNORMAL HIGH (ref 11.5–15.5)
WBC: 18.6 K/uL — ABNORMAL HIGH (ref 4.0–10.5)
nRBC: 0.3 % — ABNORMAL HIGH (ref 0.0–0.2)

## 2024-03-22 LAB — COMPREHENSIVE METABOLIC PANEL WITH GFR
ALT: 24 U/L (ref 0–44)
AST: 32 U/L (ref 15–41)
Albumin: 1.7 g/dL — ABNORMAL LOW (ref 3.5–5.0)
Alkaline Phosphatase: 142 U/L — ABNORMAL HIGH (ref 38–126)
Anion gap: 12 (ref 5–15)
BUN: 162 mg/dL — ABNORMAL HIGH (ref 8–23)
CO2: 11 mmol/L — ABNORMAL LOW (ref 22–32)
Calcium: 9.2 mg/dL (ref 8.9–10.3)
Chloride: 108 mmol/L (ref 98–111)
Creatinine, Ser: 3.34 mg/dL — ABNORMAL HIGH (ref 0.61–1.24)
GFR, Estimated: 18 mL/min — ABNORMAL LOW (ref >60)
Glucose, Bld: 149 mg/dL — ABNORMAL HIGH (ref 70–99)
Potassium: 6.4 mmol/L (ref 3.5–5.1)
Sodium: 131 mmol/L — ABNORMAL LOW (ref 135–145)
Total Bilirubin: 0.3 mg/dL (ref 0.0–1.2)
Total Protein: 6.5 g/dL (ref 6.5–8.1)

## 2024-03-22 LAB — PROTIME-INR
INR: 2.4 — ABNORMAL HIGH (ref 0.8–1.2)
Prothrombin Time: 27.7 s — ABNORMAL HIGH (ref 11.4–15.2)

## 2024-03-22 LAB — I-STAT CHEM 8, ED
BUN: >130 mg/dL — ABNORMAL HIGH (ref 8–23)
Calcium, Ion: 1.2 mmol/L (ref 1.15–1.40)
Chloride: 114 mmol/L — ABNORMAL HIGH (ref 98–111)
Creatinine, Ser: 3.8 mg/dL — ABNORMAL HIGH (ref 0.61–1.24)
Glucose, Bld: 143 mg/dL — ABNORMAL HIGH (ref 70–99)
HCT: 35 % — ABNORMAL LOW (ref 39.0–52.0)
Hemoglobin: 11.9 g/dL — ABNORMAL LOW (ref 13.0–17.0)
Potassium: 6.4 mmol/L (ref 3.5–5.1)
Sodium: 132 mmol/L — ABNORMAL LOW (ref 135–145)
TCO2: 12 mmol/L — ABNORMAL LOW (ref 22–32)

## 2024-03-22 LAB — BODY FLUID CELL COUNT WITH DIFFERENTIAL
Eos, Fluid: 0 %
Lymphs, Fluid: 13 %
Monocyte-Macrophage-Serous Fluid: 39 % — ABNORMAL LOW (ref 50–90)
Neutrophil Count, Fluid: 48 % — ABNORMAL HIGH (ref 0–25)
Total Nucleated Cell Count, Fluid: 2074 uL — ABNORMAL HIGH (ref 0–1000)

## 2024-03-22 LAB — RESP PANEL BY RT-PCR (RSV, FLU A&B, COVID)  RVPGX2
Influenza A by PCR: NEGATIVE
Influenza B by PCR: NEGATIVE
Resp Syncytial Virus by PCR: NEGATIVE
SARS Coronavirus 2 by RT PCR: NEGATIVE

## 2024-03-22 LAB — CBG MONITORING, ED: Glucose-Capillary: 186 mg/dL — ABNORMAL HIGH (ref 70–99)

## 2024-03-22 LAB — HEPATITIS B SURFACE ANTIGEN: Hepatitis B Surface Ag: NONREACTIVE

## 2024-03-22 LAB — LIPASE, BLOOD: Lipase: 86 U/L — ABNORMAL HIGH (ref 11–51)

## 2024-03-22 LAB — GLUCOSE, CAPILLARY: Glucose-Capillary: 120 mg/dL — ABNORMAL HIGH (ref 70–99)

## 2024-03-22 LAB — MRSA NEXT GEN BY PCR, NASAL: MRSA by PCR Next Gen: NOT DETECTED

## 2024-03-22 LAB — AMMONIA: Ammonia: 28 umol/L (ref 9–35)

## 2024-03-22 LAB — I-STAT CG4 LACTIC ACID, ED: Lactic Acid, Venous: 0.9 mmol/L (ref 0.5–1.9)

## 2024-03-22 MED ORDER — CALCIUM GLUCONATE-NACL 1-0.675 GM/50ML-% IV SOLN
1.0000 g | Freq: Once | INTRAVENOUS | Status: AC
  Administered 2024-03-22: 1000 mg via INTRAVENOUS
  Filled 2024-03-22: qty 50

## 2024-03-22 MED ORDER — INFLUENZA VAC SPLIT HIGH-DOSE 0.5 ML IM SUSY: 0.5000 mL | PREFILLED_SYRINGE | INTRAMUSCULAR | Status: DC | PRN

## 2024-03-22 MED ORDER — POLYVINYL ALCOHOL 1.4 % OP SOLN
Freq: Three times a day (TID) | OPHTHALMIC | Status: DC | PRN
  Filled 2024-03-22: qty 15

## 2024-03-22 MED ORDER — METRONIDAZOLE 500 MG/100ML IV SOLN
500.0000 mg | Freq: Once | INTRAVENOUS | Status: AC
  Administered 2024-03-22: 500 mg via INTRAVENOUS
  Filled 2024-03-22: qty 100

## 2024-03-22 MED ORDER — DEXTROSE 50 % IV SOLN
1.0000 | Freq: Once | INTRAVENOUS | Status: AC
  Administered 2024-03-22: 50 mL via INTRAVENOUS
  Filled 2024-03-22: qty 50

## 2024-03-22 MED ORDER — SODIUM ZIRCONIUM CYCLOSILICATE 10 G PO PACK
10.0000 g | PACK | Freq: Once | ORAL | Status: AC
  Administered 2024-03-22: 10 g via ORAL
  Filled 2024-03-22: qty 1

## 2024-03-22 MED ORDER — VANCOMYCIN HCL 1750 MG/350ML IV SOLN
1750.0000 mg | Freq: Once | INTRAVENOUS | Status: AC
  Administered 2024-03-22: 1750 mg via INTRAVENOUS
  Filled 2024-03-22: qty 350

## 2024-03-22 MED ORDER — APIXABAN 2.5 MG PO TABS
2.5000 mg | ORAL_TABLET | Freq: Two times a day (BID) | ORAL | Status: DC
Start: 2024-03-22 — End: 2024-03-24
  Administered 2024-03-22 – 2024-03-23 (×3): 2.5 mg via ORAL
  Filled 2024-03-22 (×3): qty 1

## 2024-03-22 MED ORDER — LIDOCAINE HCL (PF) 1 % IJ SOLN: 5.0000 mL | INTRAMUSCULAR | Status: DC | PRN

## 2024-03-22 MED ORDER — CHLORHEXIDINE GLUCONATE CLOTH 2 % EX PADS
6.0000 | MEDICATED_PAD | Freq: Every day | CUTANEOUS | Status: DC
  Administered 2024-03-23 – 2024-03-28 (×4): 6 via TOPICAL

## 2024-03-22 MED ORDER — LIDOCAINE-PRILOCAINE 2.5-2.5 % EX CREA: 1.0000 | TOPICAL_CREAM | CUTANEOUS | Status: DC | PRN

## 2024-03-22 MED ORDER — ACETAMINOPHEN 325 MG PO TABS
650.0000 mg | ORAL_TABLET | Freq: Four times a day (QID) | ORAL | Status: DC | PRN
  Administered 2024-03-23 – 2024-03-31 (×3): 650 mg via ORAL
  Filled 2024-03-22 (×3): qty 2

## 2024-03-22 MED ORDER — METRONIDAZOLE 500 MG/100ML IV SOLN
500.0000 mg | Freq: Two times a day (BID) | INTRAVENOUS | Status: DC
  Administered 2024-03-22: 500 mg via INTRAVENOUS
  Filled 2024-03-22: qty 100

## 2024-03-22 MED ORDER — FLUTICASONE FUROATE-VILANTEROL 100-25 MCG/ACT IN AEPB
1.0000 | INHALATION_SPRAY | Freq: Every day | RESPIRATORY_TRACT | Status: DC
  Administered 2024-03-23 – 2024-04-02 (×11): 1 via RESPIRATORY_TRACT
  Filled 2024-03-22: qty 28

## 2024-03-22 MED ORDER — MIDODRINE HCL 5 MG PO TABS
10.0000 mg | ORAL_TABLET | Freq: Three times a day (TID) | ORAL | Status: DC
  Administered 2024-03-22 – 2024-03-30 (×22): 10 mg via ORAL
  Filled 2024-03-22 (×21): qty 2

## 2024-03-22 MED ORDER — ACETAMINOPHEN 650 MG RE SUPP: 650.0000 mg | Freq: Four times a day (QID) | RECTAL | Status: DC | PRN

## 2024-03-22 MED ORDER — OCTREOTIDE ACETATE 100 MCG/ML IJ SOLN
100.0000 ug | Freq: Three times a day (TID) | INTRAMUSCULAR | Status: DC
  Administered 2024-03-22 – 2024-03-30 (×23): 100 ug via SUBCUTANEOUS
  Filled 2024-03-22 (×28): qty 1

## 2024-03-22 MED ORDER — HEPARIN SODIUM (PORCINE) 1000 UNIT/ML DIALYSIS
1000.0000 [IU] | INTRAMUSCULAR | Status: DC | PRN
  Administered 2024-03-22: 3000 [IU]
  Filled 2024-03-22: qty 1

## 2024-03-22 MED ORDER — INSULIN ASPART 100 UNIT/ML IV SOLN
5.0000 [IU] | Freq: Once | INTRAVENOUS | Status: AC
  Administered 2024-03-22: 5 [IU] via INTRAVENOUS

## 2024-03-22 MED ORDER — SODIUM CHLORIDE 0.9% FLUSH
3.0000 mL | Freq: Two times a day (BID) | INTRAVENOUS | Status: DC
  Administered 2024-03-22 – 2024-04-01 (×20): 3 mL via INTRAVENOUS

## 2024-03-22 MED ORDER — POLYETHYLENE GLYCOL 3350 17 G PO PACK
17.0000 g | PACK | Freq: Every day | ORAL | Status: DC | PRN
  Administered 2024-03-28: 17 g via ORAL
  Filled 2024-03-22: qty 1

## 2024-03-22 MED ORDER — ALBUMIN HUMAN 25 % IV SOLN
25.0000 g | Freq: Four times a day (QID) | INTRAVENOUS | Status: AC
  Administered 2024-03-22: 12.5 g via INTRAVENOUS
  Administered 2024-03-22 – 2024-03-23 (×3): 25 g via INTRAVENOUS
  Filled 2024-03-22 (×4): qty 100

## 2024-03-22 MED ORDER — VANCOMYCIN VARIABLE DOSE PER UNSTABLE RENAL FUNCTION (PHARMACIST DOSING): Status: DC

## 2024-03-22 MED ORDER — PANTOPRAZOLE SODIUM 40 MG PO TBEC
40.0000 mg | DELAYED_RELEASE_TABLET | Freq: Two times a day (BID) | ORAL | Status: DC
Start: 2024-03-22 — End: 2024-04-02
  Administered 2024-03-22 – 2024-04-02 (×22): 40 mg via ORAL
  Filled 2024-03-22 (×22): qty 1

## 2024-03-22 MED ORDER — ANTICOAGULANT SODIUM CITRATE 4% (200MG/5ML) IV SOLN
5.0000 mL | Status: DC | PRN
  Filled 2024-03-22: qty 5

## 2024-03-22 MED ORDER — THIAMINE MONONITRATE 100 MG PO TABS
100.0000 mg | ORAL_TABLET | Freq: Every day | ORAL | Status: DC
Start: 2024-03-23 — End: 2024-04-02
  Administered 2024-03-23 – 2024-04-01 (×10): 100 mg via ORAL
  Filled 2024-03-22 (×11): qty 1

## 2024-03-22 MED ORDER — SODIUM CHLORIDE 0.9 % IV SOLN: INTRAVENOUS | Status: AC | PRN

## 2024-03-22 MED ORDER — SODIUM CHLORIDE 0.9 % IV SOLN
2.0000 g | INTRAVENOUS | Status: AC
  Administered 2024-03-22 – 2024-03-29 (×8): 2 g via INTRAVENOUS
  Filled 2024-03-22 (×8): qty 20

## 2024-03-22 MED ORDER — HEPARIN SODIUM (PORCINE) 1000 UNIT/ML IJ SOLN
INTRAMUSCULAR | Status: AC
  Filled 2024-03-22: qty 3

## 2024-03-22 MED ORDER — LACTATED RINGERS IV BOLUS
500.0000 mL | Freq: Once | INTRAVENOUS | Status: AC
  Administered 2024-03-22: 500 mL via INTRAVENOUS

## 2024-03-22 MED ORDER — ATORVASTATIN CALCIUM 40 MG PO TABS
40.0000 mg | ORAL_TABLET | Freq: Every day | ORAL | Status: DC
Start: 2024-03-23 — End: 2024-04-02
  Administered 2024-03-23 – 2024-04-01 (×10): 40 mg via ORAL
  Filled 2024-03-22 (×11): qty 1

## 2024-03-22 MED ORDER — SODIUM BICARBONATE 8.4 % IV SOLN
50.0000 meq | Freq: Once | INTRAVENOUS | Status: AC
  Administered 2024-03-22: 50 meq via INTRAVENOUS
  Filled 2024-03-22: qty 50

## 2024-03-22 MED ORDER — SODIUM CHLORIDE 0.9 % IV BOLUS
1000.0000 mL | Freq: Once | INTRAVENOUS | Status: AC
  Administered 2024-03-22: 1000 mL via INTRAVENOUS

## 2024-03-22 MED ORDER — FOLIC ACID 1 MG PO TABS
1.0000 mg | ORAL_TABLET | Freq: Every day | ORAL | Status: DC
Start: 2024-03-23 — End: 2024-04-02
  Administered 2024-03-23 – 2024-04-01 (×10): 1 mg via ORAL
  Filled 2024-03-22 (×11): qty 1

## 2024-03-22 MED ORDER — COLESEVELAM HCL 625 MG PO TABS
1875.0000 mg | ORAL_TABLET | Freq: Two times a day (BID) | ORAL | Status: DC
Start: 2024-03-22 — End: 2024-04-02
  Administered 2024-03-22 – 2024-04-01 (×19): 1875 mg via ORAL
  Filled 2024-03-22 (×22): qty 3

## 2024-03-22 MED ORDER — CEFEPIME HCL 2 G IV SOLR
2.0000 g | Freq: Once | INTRAVENOUS | Status: DC
  Administered 2024-03-22: 2 g via INTRAVENOUS
  Filled 2024-03-22: qty 12.5

## 2024-03-22 MED ORDER — ALTEPLASE 2 MG IJ SOLR: 2.0000 mg | Freq: Once | INTRAMUSCULAR | Status: DC | PRN

## 2024-03-22 MED ORDER — PENTAFLUOROPROP-TETRAFLUOROETH EX AERO: 1.0000 | INHALATION_SPRAY | CUTANEOUS | Status: DC | PRN

## 2024-03-22 NOTE — H&P (Addendum)
 History and Physical   Darren [name] FMW:[id number] DOB: [birth year] DOA: 03/22/2024  PCP: [name], MD   Patient coming from: Home  Chief Complaint: Altered status  HPI: Darren [name] is a 83 y.o. male with medical history significant of hypertension, CKD, atrial fibrillation, anemia, diastolic CHF, OSA, obesity, COPD, cirrhosis with ascites presenting with worsening mental status.  History obtained with assistance of chart review and family.  Patient has had 5 days of worsening cognitive functioning and now has difficulty ambulating with significant confusion.  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  Has history of cirrhosis and has been getting paracentesis.  Last paracentesis was a week ago on 9/4 and had 5.3 L removed.  Did see a nephrologist yesterday per chart review and there was concern about his worsening renal function and he was started on increased dose of spironolactone  with increased frequency of Lasix .  Continue to have worsening status and so present to the ED today.  Unable to obtain full review of systems due to patient's encephalopathy.  ED Course: Vital signs in the ED notable for temperature 96, heart rate in the 80s-100s with some spurious low readings in the setting of A-fib, blood pressure in the 100s-110 systolic.  Lab workup included CMP with sodium 131, potassium 6.4, bicarb 11, BUN 162, creatinine elevated 3.34 from baseline of 1.4, glucose 149, albumin  1.7, alk phos 142.  CBC with leukocytosis to 18.6, hemoglobin near baseline 11, platelets 720.  PT and INR elevated at 27.7 and 2.4 respectively.  Lactic acid normal with repeat pending.  Lipase 86.  Respiratory panel for flu COVID RSV negative.  Urinalysis pending.  Ammonia level normal.  Blood culture pending.  Urine sodium pending.  Hepatitis B panel pending.  Body fluid cell count and culture pending.  Chest x-ray showed no acute abnormality.  Repeat chest x-ray  showed patient status post right IJ CVC placement.  CT head showed no acute abnormality.  Renal ultrasound is pending.  Patient received vancomycin , ceftriaxone , Flagyl  in the ED.  Also received midodrine , octreotide , albumin .  Received 1.5 L IV fluid.  For hyperkalemia received bicarb, Lokelma , calcium  gluconate, insulin  and dextrose .  Nephrology was consulted and plan to initiate dialysis today.  Critical care was consulted and evaluated patient.  They placed catheter for dialysis but feel patient is stable for stepdown unit at this time.  Reconsult as needed.  Review of Systems: Unable to obtain full review of systems due to patient's encephalopathy.  Past Medical History:  Diagnosis Date   Acute respiratory failure with hypoxia (HCC) 06/16/2022   Arthritis    maybe a little   Asthma    mild   Atrial fibrillation (HCC)    Atypical mole 08/16/2011   left upper back tx exc atypical prol.   Atypical mole 12/18/2012   solar lentigo atypical tx w/s   bilateral leg edema 09/11/2018   Cancer (HCC)    prostate cancer   CHF (congestive heart failure) (HCC)    resolved with low salt diet   COPD with acute exacerbation (HCC) 06/16/2022   Dyspnea    Dyspnea on exertion 09/11/2018   Dysrhythmia    Afib with rapid ventricular   Essential hypertension 07/25/2018   Fatty liver    GERD (gastroesophageal reflux disease)    GI bleed 2018   Gout    History of blood transfusion 2018   Obstructive sleep apnea 09/11/2018   OSA (obstructive sleep apnea)  Paroxysmal atrial fibrillation (HCC) 09/11/2018   Pre-diabetes     Past Surgical History:  Procedure Laterality Date   CARDIOVERSION N/A 10/08/2018   Procedure: CARDIOVERSION;  Surgeon: Ladona Heinz, MD;  Location: [facility] ENDOSCOPY;  Service: Cardiovascular;  Laterality: N/A;   CARDIOVERSION N/A 01/17/2019   Procedure: CARDIOVERSION;  Surgeon: Ladona Heinz, MD;  Location: [facility] ENDOSCOPY;  Service: Cardiovascular;  Laterality: N/A;   COLONOSCOPY  Left 03/10/2017   Procedure: COLONOSCOPY;  Surgeon: Burnette Fallow, MD;  Location: [facility] ENDOSCOPY;  Service: Endoscopy;  Laterality: Left;   ESOPHAGOGASTRODUODENOSCOPY (EGD) WITH PROPOFOL  Left 03/09/2017   Procedure: ESOPHAGOGASTRODUODENOSCOPY (EGD) WITH PROPOFOL ;  Surgeon: Saintclair Jasper, MD;  Location: [facility] ENDOSCOPY;  Service: Gastroenterology;  Laterality: Left;   EXCISION MASS HEAD Right 03/21/2023   Procedure: EXCISION OF NOSE MOHS DEFECT WITH REPAIR;  Surgeon: Luciano Standing, MD;  Location: [facility] OR;  Service: ENT;  Laterality: Right;   EYE SURGERY Bilateral    cataract    FINGER SURGERY Right    5th   IR PARACENTESIS  12/18/2023   IR PARACENTESIS  01/08/2024   IR PARACENTESIS  02/11/2024   IR PARACENTESIS  03/13/2024   Prostate     DaVinci   SKIN FULL THICKNESS GRAFT Right 03/21/2023   Procedure: ADJACENT TISSUE TRANSFER;  Surgeon: Luciano Standing, MD;  Location: [facility] OR;  Service: ENT;  Laterality: Right;    Social History  reports that he quit smoking about 35 years ago. His smoking use included cigarettes. He started smoking about 65 years ago. He has a 30 pack-year smoking history. He has never used smokeless tobacco. He reports current alcohol  use of about 25.0 standard drinks of alcohol  per week. He reports that he does not use drugs.  Allergies  Allergen Reactions   Amlodipine      Leg edema   Penicillins Hives    Family History  Problem Relation Age of Onset   Emphysema Father        smoked  Reviewed on admission  Prior to Admission medications   Medication Sig Start Date End Date Taking? Authorizing Provider  allopurinol  (ZYLOPRIM ) 100 MG tablet Take 100 mg by mouth daily.    Yes [provider]  apixaban  (ELIQUIS ) 5 MG TABS tablet TAKE 1 TABLET TWICE A DAY 01/30/24  Yes Ladona Heinz, MD  atorvastatin  (LIPITOR) 40 MG tablet Take 40 mg by mouth daily.  05/05/18  Yes [provider]  azelastine  (ASTELIN ) 0.1 % nasal spray Place 2 sprays into both nostrils 2 (two) times  daily. Use in each nostril as directed 11/06/23  Yes Tobie Eldora NOVAK, MD  Cholecalciferol (VITAMIN D) 50 MCG (2000 UT) tablet Take 2,000 Units by mouth daily.   Yes [provider]  colesevelam  (WELCHOL ) 625 MG tablet Take 1,875 mg by mouth 2 (two) times daily with a meal.    Yes [provider]  fluticasone  (FLONASE ) 50 MCG/ACT nasal spray PLACE 2 SPRAYS INTO BOTH NOSTRILS DAILY. USE FOR 6-8 WEEKS 01/14/24  Yes Tobie Eldora B, MD  folic acid  (FOLVITE ) 1 MG tablet Take 1 tablet (1 mg total) by mouth daily. 06/21/22  Yes Jadine Toribio SQUIBB, MD  furosemide  (LASIX ) 40 MG tablet Take 40 mg by mouth daily as needed for edema.   Yes [provider]  ibuprofen (ADVIL) 200 MG tablet Take 400-600 mg by mouth every 6 (six) hours as needed for moderate pain.   Yes [provider]  isosorbide  dinitrate (ISORDIL ) 30 MG tablet Take 1 tablet (30 mg total)  by mouth 3 (three) times daily. 07/26/23  Yes Ladona Heinz, MD  L-Methylfolate-Algae-B12-B6 [facility]) 3-90.314-2-35 MG CAPS Take 1 tablet by mouth 2 (two) times daily.    Yes [provider]  Magnesium 400 MG TABS Take 400 mg by mouth daily at 12 noon.    Yes [provider]  metoprolol  tartrate (LOPRESSOR ) 25 MG tablet Take 1 tablet (25 mg total) by mouth 2 (two) times daily. 05/23/23  Yes Ladona Heinz, MD  mometasone -formoterol  (DULERA ) 100-5 MCG/ACT AERO USE 2 INHALATIONS TWICE A DAY Patient taking differently: Inhale 2 puffs into the lungs in the morning and at bedtime. 03/11/24  Yes Darlean Ozell NOVAK, MD  Multiple Vitamin (MULTIVITAMIN WITH MINERALS) TABS tablet Take 1 tablet by mouth daily. 06/21/22  Yes Jadine Toribio SQUIBB, MD  omeprazole  (PRILOSEC) 20 MG capsule Take 20 mg by mouth 2 (two) times daily before a meal.   Yes [provider]  Polyethyl Glycol-Propyl Glycol (SYSTANE OP) Place 1 drop into both eyes 3 (three) times daily as needed (dry eyes).   Yes [provider]  spironolactone   (ALDACTONE ) 50 MG tablet Take 50 mg by mouth daily. 03/21/24  Yes [provider]  thiamine  (VITAMIN B-1) 100 MG tablet Take 100 mg by mouth daily.   Yes [provider]    Physical Exam: Vitals:   03/22/24 1505 03/22/24 1515 03/22/24 1516 03/22/24 1530  BP: (!) 119/59 106/80 106/80   Pulse: (!) 110 (!) 110 (!) 108 (!) 107  Resp: (!) 23 (!) 21 16 20   Temp: 98 F (36.7 C)     TempSrc:      SpO2:  94% 95%     Physical Exam Constitutional:      General: He is not in acute distress.    Appearance: Normal appearance.  HENT:     Head: Normocephalic and atraumatic.     Mouth/Throat:     Mouth: Mucous membranes are dry.     Pharynx: Oropharynx is clear.  Eyes:     Extraocular Movements: Extraocular movements intact.     Pupils: Pupils are equal, round, and reactive to light.  Cardiovascular:     Rate and Rhythm: Normal rate. Rhythm irregular.     Pulses: Normal pulses.     Heart sounds: Normal heart sounds.  Pulmonary:     Effort: Pulmonary effort is normal. No respiratory distress.     Breath sounds: Normal breath sounds.  Abdominal:     General: Bowel sounds are normal. There is no distension.     Palpations: Abdomen is soft.     Tenderness: There is no abdominal tenderness.  Musculoskeletal:        General: No swelling or deformity.  Skin:    General: Skin is warm and dry.  Neurological:     General: No focal deficit present.     Mental Status: He is disoriented.     Comments: Oriented to person and [facility] only    Labs on Admission: I have personally reviewed following labs and imaging studies  CBC: Recent Labs  Lab 03/22/24 0852 03/22/24 0929  WBC 18.6*  --   NEUTROABS 17.5*  --   HGB 11.0* 11.9*  HCT 35.8* 35.0*  MCV 89.9  --   PLT 720*  --     Basic Metabolic Panel: Recent Labs  Lab 03/22/24 0852 03/22/24 0929  NA 131* 132*  K 6.4* 6.4*  CL 108 114*  CO2 11*  --   GLUCOSE 149* 143*  BUN 162* >130*  CREATININE 3.34* 3.80*   CALCIUM  9.2  --     GFR: CrCl cannot be calculated (Unknown ideal weight.).  Liver Function Tests: Recent Labs  Lab 03/22/24 0852  AST 32  ALT 24  ALKPHOS 142*  BILITOT 0.3  PROT 6.5  ALBUMIN  1.7*    Urine analysis:    Component Value Date/Time   COLORURINE YELLOW 06/16/2022 0350   APPEARANCEUR CLEAR 06/16/2022 0350   LABSPEC 1.010 06/16/2022 0350   PHURINE 5.0 06/16/2022 0350   GLUCOSEU NEGATIVE 06/16/2022 0350   HGBUR NEGATIVE 06/16/2022 0350   BILIRUBINUR NEGATIVE 06/16/2022 0350   KETONESUR NEGATIVE 06/16/2022 0350   PROTEINUR NEGATIVE 06/16/2022 0350   UROBILINOGEN 0.2 09/07/2008 1037   NITRITE NEGATIVE 06/16/2022 0350   LEUKOCYTESUR NEGATIVE 06/16/2022 0350    Radiological Exams on Admission: US  RENAL Result Date: 03/22/2024 EXAM: US  Retroperitoneum Complete, Renal. CLINICAL HISTORY: 409830 AKI (acute kidney injury) (HCC) 409830. AKI (acute kidney injury) (HCC) TECHNIQUE: Real-time ultrasound of the retroperitoneum (complete) with image documentation. COMPARISON: None provided. FINDINGS: RIGHT KIDNEY: Measures 11.9 x 6.6 x 7.7 cm (319.4 cc). 7.2 cm upper pole right renal cyst. No hydronephrosis or mass visualized. LEFT KIDNEY: Not well visualized. BLADDER: Unremarkable as visualized. OTHER FINDINGS: Small - moderate ascites. IMPRESSION: 1. Right upper pole renal cyst measuring 7.2 cm. 2. Small to moderate ascites. Electronically signed by: Dayne Hassell MD 03/22/2024 03:35 PM EDT RP Workstation: [id number]   DG Chest Port 1 View Result Date: 03/22/2024 EXAM: 1 VIEW XRAY OF THE CHEST 03/22/2024 01:07:00 PM COMPARISON: 03/22/2024 CLINICAL HISTORY: Encounter for central line placement FINDINGS: LINES, TUBES AND DEVICES: Right IJ CVC in place with tip in mid SVC. LUNGS AND PLEURA: No focal pulmonary opacity. No pulmonary edema. No pleural effusion. No pneumothorax. HEART AND MEDIASTINUM: No acute abnormality of the cardiac and mediastinal silhouettes. Aortic calcification.  BONES AND SOFT TISSUES: No acute osseous abnormality. IMPRESSION: 1. Right IJ CVC in place with tip in mid SVC. No pneumothorax after catheter placement. Electronically signed by: Waddell Calk MD 03/22/2024 01:36 PM EDT RP Workstation: [id number]   CT Head Wo Contrast Result Date: 03/22/2024 CLINICAL DATA:  83 year old male with altered mental status, declining mentally over the past week. EXAM: CT HEAD WITHOUT CONTRAST TECHNIQUE: Contiguous axial images were obtained from the base of the skull through the vertex without intravenous contrast. RADIATION DOSE REDUCTION: This exam was performed according to the departmental dose-optimization program which includes automated exposure control, adjustment of the mA and/or kV according to patient size and/or use of iterative reconstruction technique. COMPARISON:  Paranasal sinus CT 11/29/2016. FINDINGS: Brain: Cerebral volume is within normal limits for age. No midline shift, ventriculomegaly, mass effect, evidence of mass lesion, intracranial hemorrhage or evidence of cortically based acute infarction. Gray-white differentiation within normal limits for age. Vascular: Calcified atherosclerosis at the skull base. No suspicious intracranial vascular hyperdensity. Skull: Intact, negative. Sinuses/Orbits: Visualized paranasal sinuses and mastoids are clear. Other: No acute orbit or scalp soft tissue finding. IMPRESSION: Normal for age noncontrast Head CT. Electronically Signed   By: VEAR Hurst M.D.   On: 03/22/2024 11:56   DG Chest Portable 1 View Result Date: 03/22/2024 EXAM: 1 VIEW XRAY OF THE CHEST 03/22/2024 09:29:00 AM COMPARISON: 06/15/2022 CLINICAL HISTORY: Infection evaluation - aspiration. Pt BIB [facility] from home d/t [facility]MS. Baseline A\T\O X4. Since Monday, pt started slowly declining mentally and physically. Pt is usually able to walk around w a walker, but is no longer to do so. Hx  liver failure and diabetes. BP 98/62; HR 80S; SPO2 95% RA; CBG 152. FINDINGS: LUNGS  AND PLEURA: No focal pulmonary opacity. No pulmonary edema. No pleural effusion. No pneumothorax. HEART AND MEDIASTINUM: No acute abnormality of the cardiac and mediastinal silhouettes. Mildly elevated left hemidiaphragm. BONES AND SOFT TISSUES: No acute osseous abnormality. IMPRESSION: 1. No acute findings. 2. Mildly elevated left hemidiaphragm. Electronically signed by: Waddell Calk MD 03/22/2024 09:55 AM EDT RP Workstation: [id number]   EKG: Independently reviewed.  Sinus rhythm at 93 bpm.  Nonspecific T wave changes.  Low voltage in multiple leads.  Nonspecific intraventricular conduction delay  Assessment/Plan Principal Problem:   Acute renal failure superimposed on chronic kidney disease (HCC) Active Problems:   COPD  GOLD III   Morbid obesity due to excess calories (HCC)   Essential hypertension   Obstructive sleep apnea   Paroxysmal atrial fibrillation (HCC)   Chronic diastolic CHF (congestive heart failure) (HCC)   Cirrhosis (HCC)   SIRS (systemic inflammatory response syndrome) (HCC)   Acute encephalopathy > Progressive decline in mental status for past 5 days now with decreased ability to ambulate and significant confusion. 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 > Presumed etiology is uremic encephalopathy in the setting of worsening renal failure and uremia with BUN of 162 as below. > There is also concern for infection which is being worked up as below. - Monitor on progressive unit - Treatment for AKI/uremia via initiation of HD as below - Workup and treatment for infection as below - Supportive care  AKI Uremia Hyperkalemia Hyponatremia > Creatinine elevated to 3.34 from baseline at 1.4.  BUN elevated to 162 with bicarb 11.  Potassium 6.4.  Sodium 131. > Received temporizing measures for hyperkalemia with Lokelma , calcium  gluconate, insulin , dextrose . > Received bicarb for hyperkalemia and metabolic acidosis. > Nephrology  was consulted and plan is to initiate dialysis today for uremic encephalopathy and other laboratory derangements. > Critical care consulted and have placed a dialysis catheter, feel patient is currently stable for the floor. > AKI likely secondary to commendation of being intravascularly dry from paracentesis and diuretic use as well as a component of hepatorenal syndrome. - Monitor in progressive unit for now - Proceed nephrology recommendations and assistance - Plan for trial of intermittent HD, may require ICU/CRRT if he does not tolerate pending goals of care discussions - Continue to trend renal function and electrolytes - Continue with midodrine  and-received octreotide  and albumin   SIRS > Patient presenting with confusion as above.  In addition to his uremia noted to have leukocytosis to 18.6.  Possibly hemoconcentration component however also has borderline hypothermia at 96 degrees. > Possible sources include SBP with recent paracentesis, UTI, blood culture. - Urinalysis is pending, will follow - Blood cultures pending - Paracentesis with labs have been ordered, follow-up - Continue with vancomycin , ceftriaxone , Flagyl  for now; likely will be able to narrow soon  Decompensated cirrhosis > Presenting with altered mental status in the setting of AKI with uremia with component of hepatorenal syndrome. > Current MELD NA score is around 31 which is increased from baseline. > LFTs are stable other than mild alk phos elevation 142.  INR 2.4, platelets elevated at 729.  Creatinine 3.34.  Sodium 131. - Nephrology following for renal failure component and plan for HD as above - Consult to gastroenterology, follows with them outpatient. - Holding Lasix  and spironolactone  - Midodrine , octreotide , PPI  Hypertension - Holding antihypertensives in the setting of  low blood pressure and initiation of dialysis  Atrial fibrillation - Holding metoprolol  with low blood pressure - Continue reduced  dose Eliquis   Anemia - Hemoglobin stable - Trend CBC  Diastolic CHF > Echo in 2022 with EF 50-55%, G2 DD, moderate pulmonary regurgitation. - Holding diuretics in the setting of dehydration/AKI/initiation of HD.  OSA - Continue home CPAP  Obesity - Noted  COPD - Replace home Dulera  with formulary Breo  DVT prophylaxis: Eliquis  Code Status:   Full Family Communication:  Wife updated by phone  Disposition Plan:   Patient is from:  Home  Anticipated DC to:  Pending clinical course  Anticipated DC date:  5 to 10 days  Anticipated DC barriers: None  Consults called:  Nephrology, critical care, gastroenterology Admission status:  Inpatient, progressive  Severity of Illness: The appropriate patient status for this patient is INPATIENT. Inpatient status is judged to be reasonable and necessary in order to provide the required intensity of service to ensure the patient's safety. The patient's presenting symptoms, physical exam findings, and initial radiographic and laboratory data in the context of their chronic comorbidities is felt to place them at high risk for further clinical deterioration. Furthermore, it is not anticipated that the patient will be medically stable for discharge from the hospital within 2 midnights of admission.   * I certify that at the point of admission it is my clinical judgment that the patient will require inpatient hospital care spanning beyond 2 midnights from the point of admission due to high intensity of service, high risk for further deterioration and high frequency of surveillance required.Darren Marsa KATHEE Seena MD [facility]  How to contact the [facility] Attending or Consulting provider 7A - 7P or covering provider during after hours 7P -7A, for this patient?   Check the care team in [facility] and look for a) attending/consulting [facility] provider listed and b) the [facility] team listed Log into [email or web address] and use [facility] universal password to access.  If you do not have the password, please contact the hospital operator. Locate the [facility] provider you are looking for under [facility] and page to a number that you can be directly reached. If you still have difficulty reaching the provider, please page the [facility] (Director on Call) for the Hospitalists listed on amion for assistance.  03/22/2024, 4:26 PM

## 2024-03-22 NOTE — Progress Notes (Signed)
 ED Pharmacy Antibiotic Sign Off An antibiotic consult was received from an ED provider for vancomycin  per pharmacy dosing for sepsis. A chart review was completed to assess appropriateness.   The following one time order(s) were placed:  Vancomycin  1750 mg IV x 1  Further antibiotic and/or antibiotic pharmacy consults should be ordered by the admitting provider if indicated.   Thank you for allowing pharmacy to be a part of this patient's care.   Dorn Poot, [facility]  Clinical Pharmacist 03/22/24 10:22 AM

## 2024-03-22 NOTE — Procedures (Signed)
 Central Venous Catheter Insertion Procedure Note  CLEOTHA TSANG  [id number]  05/04/1941  Date:03/22/24  Time:12:26 PM   Provider Performing:Naudia Crosley JAYSON Sharps   Procedure: Insertion of Non-tunneled Central Venous Catheter(36556)with US  guidance (23062)    Indication(s) Hemodialysis  Consent Risks of the procedure as well as the alternatives and risks of each were explained to the patient and/or caregiver.  Consent for the procedure was obtained and is signed in the bedside chart  Anesthesia Topical only with 1% lidocaine    Timeout Verified patient identification, verified procedure, site/side was marked, verified correct patient position, special equipment/implants available, medications/allergies/relevant history reviewed, required imaging and test results available.  Sterile Technique Maximal sterile technique including full sterile barrier drape, hand hygiene, sterile gown, sterile gloves, mask, hair covering, sterile ultrasound probe cover (if used).  Procedure Description Area of catheter insertion was cleaned with chlorhexidine  and draped in sterile fashion.   With real-time ultrasound guidance a HD catheter was placed into the right internal jugular vein.  Nonpulsatile blood flow and easy flushing noted in all ports.  The catheter was sutured in place and sterile dressing applied.  Complications/Tolerance None; patient tolerated the procedure well. Chest X-ray is ordered to verify placement for internal jugular or subclavian cannulation.  Chest x-ray is not ordered for femoral cannulation.  EBL Minimal  Specimen(s) None

## 2024-03-22 NOTE — Consult Note (Signed)
 NAME:  Darren [name], MRN:  [id number], DOB:  April 10, 1941, LOS: 0 ADMISSION DATE:  03/22/2024, CONSULTATION DATE:  03/22/24 REFERRING MD:  Dr. Cottie - EDP, CHIEF COMPLAINT: Altered mental status  History of Present Illness:  Darren [name] is a 83 year old male with medical history significant for cirrhosis with abdominal ascites (last paracentesis 9/4, 5.3L removed), atrial fibrillation on Eliquis , CHF, essential hypertension, GERD, and OSA who presented to the ED at [facility] 9/13 with AMS. Per wife patient has had a progressive decline in mentation and generalized weakness that began 5 days prior to admission. On ED arrival patient was seen hypothermic with mild tachycardia but all other vital signs were within normal limits. Labor significant for NA 131, K 6.4, creatinine 3.34, BUN 162, alkaline phosphatase 142, lipase 88, albumin  1.7, WBC 18.6, hgb 11.0, plt 720. Due to the acute severe kidney injury both PCCM and nephrology were consulted.   Pertinent  Medical History  cirrhosis with abdominal ascites (last paracentesis 9/4, 5.3L removed), atrial fibrillation on Eliquis , CHF, essential hypertension, GERD, and OSA   Significant Hospital Events: Including procedures, antibiotic start and stop dates in addition to other pertinent events   9/13 presenting with acute altered mental status found to have severe AKI.  HD catheter placed in ED with plans to initiate hemodialysis  Interim History / Subjective:  Awake and able to state name and date of birth but confused to situation  Objective    Blood pressure 106/82, pulse 93, temperature (!) 96.7 F (35.9 C), temperature source Rectal, resp. rate 18, SpO2 100%.       No intake or output data in the 24 hours ending 03/22/24 1056 There were no vitals filed for this visit.  Examination: General: Acute on chronic ill-appearing deconditioned elderly male lying in bed in no acute distress HEENT: [place]/AT, MM pink/moist, PERRL,  Neuro: Alert to  self only CV: s1s2 regular rate and rhythm, no murmur, rubs, or gallops,  PULM: Clear to auscultation bilaterally, no increased work of breathing, no added breath sounds GI: soft, bowel sounds active in all 4 quadrants, non-tender, non-distended, tolerating oral diet Extremities: warm/dry, no edema  Skin: no rashes or lesions   Resolved problem list   Assessment and Plan  Severe acute Kidney Injury with concerns for hepatorenal syndrome - Creatinine 3.34 with a BUN 162 and GFR 18 on admission compared to creatinine 1.30 with GFR 56 September 2024 Hyperkalemia  P: Nephrology consulted appreciate assistance Tentative plan to initiate hemodialysis today Begin HRS treatment including albumin , midodrine , and octreotide  Empiric ceftriaxone  Trend Bmet Avoid nephrotoxins Ensure adequate renal perfusion  IV hydration provided on admission   Alcoholic cirrhosis with abdominal ascites -MELD - MA 31 on admission per outpatient GI last MELD was 17 July of this year  -Last paracentesis 9/4, 5.3L removed P: Recommend GI consult to assist in management Discuss with GI need/benefit of steroids  Empiric antibiotics as above   Leukocytosis  P: Empiric Ceftriaxone   Follow CBC and fever curve   Atrial fibrillation on Eliquis  Essential HTN Hyperlipidemia  P: Continuous telemetry  Consider heparin  bridge until all needed procedures are completed  Optimize electrolytes  Resume home statin when able to take oral diet  Hold home antihypertensives and diuretic   Protein calorie malnutrition  P: Optimize nutrition when able  Supplement protein when able   Labs   CBC: Recent Labs  Lab 03/22/24 0852 03/22/24 0929  WBC 18.6*  --   NEUTROABS 17.5*  --  HGB 11.0* 11.9*  HCT 35.8* 35.0*  MCV 89.9  --   PLT 720*  --     Basic Metabolic Panel: Recent Labs  Lab 03/22/24 0852 03/22/24 0929  NA 131* 132*  K 6.4* 6.4*  CL 108 114*  CO2 11*  --   GLUCOSE 149* 143*  BUN 162* >130*   CREATININE 3.34* 3.80*  CALCIUM  9.2  --    GFR: CrCl cannot be calculated (Unknown ideal weight.). Recent Labs  Lab 03/22/24 0852 03/22/24 0931  WBC 18.6*  --   LATICACIDVEN  --  0.9    Liver Function Tests: Recent Labs  Lab 03/22/24 0852  AST 32  ALT 24  ALKPHOS 142*  BILITOT 0.3  PROT 6.5  ALBUMIN  1.7*   No results for input(s): LIPASE, AMYLASE in the last 168 hours. Recent Labs  Lab 03/22/24 0852  AMMONIA 28    ABG    Component Value Date/Time   HCO3 24.7 06/16/2022 0348   TCO2 12 (L) 03/22/2024 0929   ACIDBASEDEF 3.0 (H) 06/16/2022 0348   O2SAT 96 06/16/2022 0348     Coagulation Profile: Recent Labs  Lab 03/22/24 0852  INR 2.4*    Cardiac Enzymes: No results for input(s): CKTOTAL, CKMB, CKMBINDEX, TROPONINI in the last 168 hours.  HbA1C: No results found for: HGBA1C  CBG: No results for input(s): GLUCAP in the last 168 hours.  Review of Systems:   Unable to assess   Past Medical History:  He,  has a past medical history of Arthritis, Asthma, Atrial fibrillation (HCC), Atypical mole (08/16/2011), Atypical mole (12/18/2012), bilateral leg edema (09/11/2018), Cancer (HCC), CHF (congestive heart failure) (HCC), Dyspnea, Dyspnea on exertion (09/11/2018), Dysrhythmia, Essential hypertension (07/25/2018), Fatty liver, GERD (gastroesophageal reflux disease), GI bleed (2018), Gout, History of blood transfusion (2018), Obstructive sleep apnea (09/11/2018), OSA (obstructive sleep apnea), Paroxysmal atrial fibrillation (HCC) (09/11/2018), and Pre-diabetes.   Surgical History:   Past Surgical History:  Procedure Laterality Date   CARDIOVERSION N/A 10/08/2018   Procedure: CARDIOVERSION;  Surgeon: Ladona Heinz, MD;  Location: [facility] ENDOSCOPY;  Service: Cardiovascular;  Laterality: N/A;   CARDIOVERSION N/A 01/17/2019   Procedure: CARDIOVERSION;  Surgeon: Ladona Heinz, MD;  Location: [facility] ENDOSCOPY;  Service: Cardiovascular;  Laterality: N/A;    COLONOSCOPY Left 03/10/2017   Procedure: COLONOSCOPY;  Surgeon: Burnette Fallow, MD;  Location: [facility] ENDOSCOPY;  Service: Endoscopy;  Laterality: Left;   ESOPHAGOGASTRODUODENOSCOPY (EGD) WITH PROPOFOL  Left 03/09/2017   Procedure: ESOPHAGOGASTRODUODENOSCOPY (EGD) WITH PROPOFOL ;  Surgeon: Saintclair Jasper, MD;  Location: [facility] ENDOSCOPY;  Service: Gastroenterology;  Laterality: Left;   EXCISION MASS HEAD Right 03/21/2023   Procedure: EXCISION OF NOSE MOHS DEFECT WITH REPAIR;  Surgeon: Luciano Standing, MD;  Location: [facility] OR;  Service: ENT;  Laterality: Right;   EYE SURGERY Bilateral    cataract    FINGER SURGERY Right    5th   IR PARACENTESIS  12/18/2023   IR PARACENTESIS  01/08/2024   IR PARACENTESIS  02/11/2024   IR PARACENTESIS  03/13/2024   Prostate     DaVinci   SKIN FULL THICKNESS GRAFT Right 03/21/2023   Procedure: ADJACENT TISSUE TRANSFER;  Surgeon: Luciano Standing, MD;  Location: [facility] OR;  Service: ENT;  Laterality: Right;     Social History:   reports that he quit smoking about 35 years ago. His smoking use included cigarettes. He started smoking about 65 years ago. He has a 30 pack-year smoking history. He has never used smokeless tobacco. He reports current alcohol  use of  about 25.0 standard drinks of alcohol  per week. He reports that he does not use drugs.   Family History:  His family history includes Emphysema in his father.   Allergies Allergies  Allergen Reactions   Amlodipine      Leg edema   Penicillins Hives     Home Medications  Prior to Admission medications   Medication Sig Start Date End Date Taking? Authorizing Provider  spironolactone  (ALDACTONE ) 50 MG tablet Take 50 mg by mouth daily. 03/21/24  Yes [provider]  allopurinol  (ZYLOPRIM ) 100 MG tablet Take 100 mg by mouth daily.     [provider]  apixaban  (ELIQUIS ) 5 MG TABS tablet TAKE 1 TABLET TWICE A DAY 01/30/24   Ladona Heinz, MD  atorvastatin  (LIPITOR) 40 MG tablet Take 40 mg by mouth daily.  05/05/18    [provider]  azelastine  (ASTELIN ) 0.1 % nasal spray Place 2 sprays into both nostrils 2 (two) times daily. Use in each nostril as directed 11/06/23   Tobie Eldora NOVAK, MD  Cholecalciferol (VITAMIN D) 50 MCG (2000 UT) tablet Take 2,000 Units by mouth daily.    [provider]  colesevelam  (WELCHOL ) 625 MG tablet Take 1,875 mg by mouth 2 (two) times daily with a meal.     [provider]  fluticasone  (FLONASE ) 50 MCG/ACT nasal spray PLACE 2 SPRAYS INTO BOTH NOSTRILS DAILY. USE FOR 6-8 WEEKS 01/14/24   Tobie Eldora B, MD  folic acid  (FOLVITE ) 1 MG tablet Take 1 tablet (1 mg total) by mouth daily. 06/21/22   Jadine Toribio SQUIBB, MD  furosemide  (LASIX ) 40 MG tablet Take 40 mg by mouth daily as needed for edema.    [provider]  hydrALAZINE  (APRESOLINE ) 50 MG tablet Take 1 tablet (50 mg total) by mouth 3 (three) times daily. 05/23/23   Ladona Heinz, MD  ibuprofen (ADVIL) 200 MG tablet Take 400-600 mg by mouth every 6 (six) hours as needed for moderate pain.    [provider]  isosorbide  dinitrate (ISORDIL ) 30 MG tablet Take 1 tablet (30 mg total) by mouth 3 (three) times daily. 07/26/23   Ladona Heinz, MD  L-Methylfolate-Algae-B12-B6 [facility]) 3-90.314-2-35 MG CAPS Take 1 tablet by mouth 2 (two) times daily.     [provider]  losartan  (COZAAR ) 25 MG tablet TAKE 1 TABLET BY MOUTH EVERYDAY AT BEDTIME 03/06/24   Ladona Heinz, MD  Magnesium 400 MG TABS Take 400 mg by mouth daily at 12 noon.     [provider]  metoprolol  tartrate (LOPRESSOR ) 25 MG tablet Take 1 tablet (25 mg total) by mouth 2 (two) times daily. 05/23/23   Ladona Heinz, MD  mometasone -formoterol  (DULERA ) 100-5 MCG/ACT AERO USE 2 INHALATIONS TWICE A DAY 03/11/24   Wert, Sharif B, MD  Multiple Vitamin (MULTIVITAMIN WITH MINERALS) TABS tablet Take 1 tablet by mouth daily. 06/21/22   Jadine Toribio SQUIBB, MD  omeprazole  (PRILOSEC) 20 MG capsule Take 20 mg by mouth 2 (two) times daily before a  meal.    [provider]  Polyethyl Glycol-Propyl Glycol (SYSTANE OP) Place 1 drop into both eyes 3 (three) times daily as needed (dry eyes).    [provider]  thiamine  (VITAMIN B-1) 100 MG tablet Take 100 mg by mouth daily.    [provider]     Critical care time: NA  Cleotha Whalin D. Arloa, NP-C [facility] Personal contact information can be found on Amion  If no contact or response made please  call [phone] 03/22/2024, 12:06 PM

## 2024-03-22 NOTE — Progress Notes (Addendum)
 Pharmacy Antibiotic Note  Darren [name] is a 83 y.o. male admitted on 03/22/2024 presenting with sepsis.  Pharmacy has been consulted for vancomycin  dosing.  Vancomycin  1750 mg IV x 1 given in ED  Plan: Variable vancomycin  dosing based on levels due to unstable renal function Monitor renal function, iHD vs CRRT plans, Cx and clinical progression to narrow Vancomycin  levels as indicated      Temp (24hrs), Avg:96.7 F (35.9 C), Min:96.7 F (35.9 C), Max:96.7 F (35.9 C)  Recent Labs  Lab 03/22/24 0852 03/22/24 0929 03/22/24 0931  WBC 18.6*  --   --   CREATININE 3.34* 3.80*  --   LATICACIDVEN  --   --  0.9    CrCl cannot be calculated (Unknown ideal weight.).    Allergies  Allergen Reactions   Amlodipine      Leg edema   Penicillins Hives    Dorn Poot, PharmD, [facility] Clinical Pharmacist ED Pharmacist Phone # [phone] 03/22/2024 12:58 PM

## 2024-03-22 NOTE — ED Provider Notes (Signed)
 [place] EMERGENCY DEPARTMENT AT [facility] Provider Note   CSN: [id number] Arrival date & time: 03/22/24  9185     Patient presents with: Altered Mental Status   Darren [name] is a 83 y.o. male with a history of atrial fibrillation, high cholesterol, presenting to the ED with confusion.  EMS reports that the patient had a paracentesis performed about a week ago in the hospital.  He was doing well at home according to his wife, but beginning for 5 days ago he has had a progressive decline in his cognitive function and is no longer able to ambulate.  She says she seems very confused to her.  The patient is level 5 caveat and cannot provide any further information.  He denies any pain to me.  Denies headache or chest pain.  Denies abdominal pain  His wife reports the patient was cognitively very sharp before this began.  He is a former retired Acupuncturist.  He was doing multiple crossword puzzles daily.  She feels that this confusion is quite abrupt.  She does report that they went to see a nephrologist for the first time yesterday, Dr. Marylynn from [facility], at the request of his GI doctor, who was concerned about worsening hepatic encephalopathy and wanting to adjust his diuretics which he takes at home.  He was prescribed Eliquis  by cardiology in July.  His wife reports he did not take it this morning but otherwise been compliant with it.  Did review external records and the patient had 5.3 L of yellow fluid drained with paracentesis on September 4.   HPI     Prior to Admission medications   Medication Sig Start Date End Date Taking? Authorizing Provider  allopurinol  (ZYLOPRIM ) 100 MG tablet Take 100 mg by mouth daily.    Yes [provider]  apixaban  (ELIQUIS ) 5 MG TABS tablet TAKE 1 TABLET TWICE A DAY 01/30/24  Yes Ladona Heinz, MD  atorvastatin  (LIPITOR) 40 MG tablet Take 40 mg by mouth daily.  05/05/18  Yes [provider]  azelastine   (ASTELIN ) 0.1 % nasal spray Place 2 sprays into both nostrils 2 (two) times daily. Use in each nostril as directed 11/06/23  Yes Tobie Eldora NOVAK, MD  Cholecalciferol (VITAMIN D) 50 MCG (2000 UT) tablet Take 2,000 Units by mouth daily.   Yes [provider]  colesevelam  (WELCHOL ) 625 MG tablet Take 1,875 mg by mouth 2 (two) times daily with a meal.    Yes [provider]  fluticasone  (FLONASE ) 50 MCG/ACT nasal spray PLACE 2 SPRAYS INTO BOTH NOSTRILS DAILY. USE FOR 6-8 WEEKS 01/14/24  Yes Tobie Eldora B, MD  folic acid  (FOLVITE ) 1 MG tablet Take 1 tablet (1 mg total) by mouth daily. 06/21/22  Yes Jadine Toribio SQUIBB, MD  furosemide  (LASIX ) 40 MG tablet Take 40 mg by mouth daily as needed for edema.   Yes [provider]  ibuprofen (ADVIL) 200 MG tablet Take 400-600 mg by mouth every 6 (six) hours as needed for moderate pain.   Yes [provider]  isosorbide  dinitrate (ISORDIL ) 30 MG tablet Take 1 tablet (30 mg total) by mouth 3 (three) times daily. 07/26/23  Yes Ladona Heinz, MD  L-Methylfolate-Algae-B12-B6 [facility]) 3-90.314-2-35 MG CAPS Take 1 tablet by mouth 2 (two) times daily.    Yes [provider]  Magnesium 400 MG TABS Take 400 mg by mouth daily at 12 noon.    Yes [provider]  metoprolol  tartrate (LOPRESSOR ) 25  MG tablet Take 1 tablet (25 mg total) by mouth 2 (two) times daily. 05/23/23  Yes Ladona Heinz, MD  mometasone -formoterol  (DULERA ) 100-5 MCG/ACT AERO USE 2 INHALATIONS TWICE A DAY Patient taking differently: Inhale 2 puffs into the lungs in the morning and at bedtime. 03/11/24  Yes Darlean Ozell NOVAK, MD  Multiple Vitamin (MULTIVITAMIN WITH MINERALS) TABS tablet Take 1 tablet by mouth daily. 06/21/22  Yes Jadine Toribio SQUIBB, MD  omeprazole  (PRILOSEC) 20 MG capsule Take 20 mg by mouth 2 (two) times daily before a meal.   Yes [provider]  Polyethyl Glycol-Propyl Glycol (SYSTANE OP) Place 1 drop into both eyes 3 (three) times daily as  needed (dry eyes).   Yes [provider]  spironolactone  (ALDACTONE ) 50 MG tablet Take 50 mg by mouth daily. 03/21/24  Yes [provider]  thiamine  (VITAMIN B-1) 100 MG tablet Take 100 mg by mouth daily.   Yes [provider]  hydrALAZINE  (APRESOLINE ) 50 MG tablet Take 1 tablet (50 mg total) by mouth 3 (three) times daily. Patient not taking: Reported on 03/22/2024 05/23/23   Ladona Heinz, MD  losartan  (COZAAR ) 25 MG tablet TAKE 1 TABLET BY MOUTH EVERYDAY AT BEDTIME Patient not taking: Reported on 03/22/2024 03/06/24   Ladona Heinz, MD    Allergies: Amlodipine  and Penicillins    Review of Systems  Updated Vital Signs BP 107/62   Pulse (!) 33   Temp (!) 96.1 F (35.6 C) (Axillary)   Resp (!) 24   SpO2 100%   Physical Exam Constitutional:      General: He is not in acute distress. HENT:     Head: Normocephalic and atraumatic.  Eyes:     Conjunctiva/sclera: Conjunctivae normal.     Pupils: Pupils are equal, round, and reactive to light.  Cardiovascular:     Rate and Rhythm: Normal rate and regular rhythm.  Pulmonary:     Effort: Pulmonary effort is normal. No respiratory distress.  Abdominal:     Tenderness: There is no abdominal tenderness.     Comments: Fluid wave, no tenderness  Skin:    General: Skin is warm and dry.     Comments: Extremities are cool and mottled  Neurological:     Mental Status: He is alert.     Comments: Oriented only to self and place, moving all extremities     (all labs ordered are listed, but only abnormal results are displayed) Labs Reviewed  COMPREHENSIVE METABOLIC PANEL WITH GFR - Abnormal; Notable for the following components:      Result Value   Sodium 131 (*)    Potassium 6.4 (*)    CO2 11 (*)    Glucose, Bld 149 (*)    BUN 162 (*)    Creatinine, Ser 3.34 (*)    Albumin  1.7 (*)    Alkaline Phosphatase 142 (*)    GFR, Estimated 18 (*)    All other components within normal limits  CBC WITH DIFFERENTIAL/PLATELET  - Abnormal; Notable for the following components:   WBC 18.6 (*)    RBC 3.98 (*)    Hemoglobin 11.0 (*)    HCT 35.8 (*)    RDW 17.1 (*)    Platelets 720 (*)    nRBC 0.3 (*)    Neutro Abs 17.5 (*)    Lymphs Abs 0.2 (*)    Abs Immature Granulocytes 0.16 (*)    All other components within normal limits  LIPASE, BLOOD - Abnormal; Notable for the following  components:   Lipase 86 (*)    All other components within normal limits  PROTIME-INR - Abnormal; Notable for the following components:   Prothrombin Time 27.7 (*)    INR 2.4 (*)    All other components within normal limits  I-STAT CHEM 8, ED - Abnormal; Notable for the following components:   Sodium 132 (*)    Potassium 6.4 (*)    Chloride 114 (*)    BUN >130 (*)    Creatinine, Ser 3.80 (*)    Glucose, Bld 143 (*)    TCO2 12 (*)    Hemoglobin 11.9 (*)    HCT 35.0 (*)    All other components within normal limits  CBG MONITORING, ED - Abnormal; Notable for the following components:   Glucose-Capillary 186 (*)    All other components within normal limits  RESP PANEL BY RT-PCR (RSV, FLU A&B, COVID)  RVPGX2  CULTURE, BLOOD (ROUTINE X 2)  CULTURE, BLOOD (ROUTINE X 2)  BODY FLUID CULTURE W GRAM STAIN  AMMONIA  URINALYSIS, ROUTINE W REFLEX MICROSCOPIC  SODIUM, URINE, RANDOM  HEPATITIS B SURFACE ANTIGEN  HEPATITIS B SURFACE ANTIBODY, QUANTITATIVE  PROTEIN / CREATININE RATIO, URINE  BODY FLUID CELL COUNT WITH DIFFERENTIAL  URINALYSIS, W/ REFLEX TO CULTURE (INFECTION SUSPECTED)  I-STAT CG4 LACTIC ACID, ED    EKG: EKG Interpretation Date/Time:  Saturday March 22 2024 08:36:39 EDT Ventricular Rate:  92 PR Interval:  181 QRS Duration:  113 QT Interval:  378 QTC Calculation: 468 R Axis:   -69  Text Interpretation: Sinus rhythm LAD, consider left anterior fascicular block Low voltage, extremity leads Confirmed by Cottie Cough [phone]) on 03/22/2024 8:39:45 AM  Radiology: [facility] Chest Port 1 View Result Date:  03/22/2024 EXAM: 1 VIEW XRAY OF THE CHEST 03/22/2024 01:07:00 PM COMPARISON: 03/22/2024 CLINICAL HISTORY: Encounter for central line placement FINDINGS: LINES, TUBES AND DEVICES: Right IJ CVC in place with tip in mid SVC. LUNGS AND PLEURA: No focal pulmonary opacity. No pulmonary edema. No pleural effusion. No pneumothorax. HEART AND MEDIASTINUM: No acute abnormality of the cardiac and mediastinal silhouettes. Aortic calcification. BONES AND SOFT TISSUES: No acute osseous abnormality. IMPRESSION: 1. Right IJ CVC in place with tip in mid SVC. No pneumothorax after catheter placement. Electronically signed by: Waddell Calk MD 03/22/2024 01:36 PM EDT RP Workstation: [id number]   CT Head Wo Contrast Result Date: 03/22/2024 CLINICAL DATA:  83 year old male with altered mental status, declining mentally over the past week. EXAM: CT HEAD WITHOUT CONTRAST TECHNIQUE: Contiguous axial images were obtained from the base of the skull through the vertex without intravenous contrast. RADIATION DOSE REDUCTION: This exam was performed according to the departmental dose-optimization program which includes automated exposure control, adjustment of the mA and/or kV according to patient size and/or use of iterative reconstruction technique. COMPARISON:  Paranasal sinus CT 11/29/2016. FINDINGS: Brain: Cerebral volume is within normal limits for age. No midline shift, ventriculomegaly, mass effect, evidence of mass lesion, intracranial hemorrhage or evidence of cortically based acute infarction. Gray-white differentiation within normal limits for age. Vascular: Calcified atherosclerosis at the skull base. No suspicious intracranial vascular hyperdensity. Skull: Intact, negative. Sinuses/Orbits: Visualized paranasal sinuses and mastoids are clear. Other: No acute orbit or scalp soft tissue finding. IMPRESSION: Normal for age noncontrast Head CT. Electronically Signed   By: VEAR Hurst M.D.   On: 03/22/2024 11:56   DG Chest Portable 1  View Result Date: 03/22/2024 EXAM: 1 VIEW XRAY OF THE CHEST 03/22/2024 09:29:00 AM COMPARISON: 06/15/2022 CLINICAL  HISTORY: Infection evaluation - aspiration. Pt BIB [facility] from home d/t AMS. Baseline A\T\O X4. Since Monday, pt started slowly declining mentally and physically. Pt is usually able to walk around w a walker, but is no longer to do so. Hx liver failure and diabetes. BP 98/62; HR 80S; SPO2 95% RA; CBG 152. FINDINGS: LUNGS AND PLEURA: No focal pulmonary opacity. No pulmonary edema. No pleural effusion. No pneumothorax. HEART AND MEDIASTINUM: No acute abnormality of the cardiac and mediastinal silhouettes. Mildly elevated left hemidiaphragm. BONES AND SOFT TISSUES: No acute osseous abnormality. IMPRESSION: 1. No acute findings. 2. Mildly elevated left hemidiaphragm. Electronically signed by: Waddell Calk MD 03/22/2024 09:55 AM EDT RP Workstation: [id number]     .Critical Care  Performed by: Cottie Donnice PARAS, MD Authorized by: Cottie Donnice PARAS, MD   Critical care provider statement:    Critical care time (minutes):  40   Critical care time was exclusive of:  Separately billable procedures and treating other patients   Critical care was necessary to treat or prevent imminent or life-threatening deterioration of the following conditions:  Sepsis and metabolic crisis   Critical care was time spent personally by me on the following activities:  Ordering and performing treatments and interventions, ordering and review of laboratory studies, ordering and review of radiographic studies, pulse oximetry, review of old charts, examination of patient and evaluation of patient's response to treatment Comments:     Management of sepsis and hyperkalemia    Medications Ordered in the ED  metroNIDAZOLE  (FLAGYL ) IVPB 500 mg (500 mg Intravenous New Bag/Given 03/22/24 1328)  vancomycin  (VANCOREADY) IVPB 1750 mg/350 mL (has no administration in time range)  Chlorhexidine  Gluconate Cloth 2 % PADS 6 each  (has no administration in time range)  pentafluoroprop-tetrafluoroeth (GEBAUERS) aerosol 1 Application (has no administration in time range)  lidocaine  (PF) (XYLOCAINE ) 1 % injection 5 mL (has no administration in time range)  lidocaine -prilocaine  (EMLA ) cream 1 Application (has no administration in time range)  heparin  injection 1,000 Units (has no administration in time range)  anticoagulant sodium citrate  solution 5 mL (has no administration in time range)  alteplase  (CATHFLO ACTIVASE ) injection 2 mg (has no administration in time range)  albumin  human 25 % solution 25 g (25 g Intravenous New Bag/Given 03/22/24 1245)  midodrine  (PROAMATINE ) tablet 10 mg (10 mg Oral Given 03/22/24 1249)  octreotide  (SANDOSTATIN ) injection 100 mcg (has no administration in time range)  cefTRIAXone  (ROCEPHIN ) 2 g in sodium chloride  0.9 % 100 mL IVPB (0 g Intravenous Stopped 03/22/24 1313)  metroNIDAZOLE  (FLAGYL ) IVPB 500 mg (has no administration in time range)  vancomycin  variable dose per unstable renal function (pharmacist dosing) (has no administration in time range)  insulin  aspart (novoLOG ) injection 5 Units (5 Units Intravenous Given 03/22/24 1031)    And  dextrose  50 % solution 50 mL (50 mLs Intravenous Given 03/22/24 1027)  calcium  gluconate 1 g/ 50 mL sodium chloride  IVPB (0 mg Intravenous Stopped 03/22/24 1110)  sodium bicarbonate  injection 50 mEq (50 mEq Intravenous Given 03/22/24 1027)  sodium zirconium cyclosilicate  (LOKELMA ) packet 10 g (10 g Oral Given 03/22/24 1034)  sodium chloride  0.9 % bolus 1,000 mL (0 mLs Intravenous Stopped 03/22/24 1110)  lactated ringers  bolus 500 mL (500 mLs Intravenous New Bag/Given 03/22/24 1328)    Clinical Course as of 03/22/24 1352  Sat Mar 22, 2024  0932 Istat chem 8 may be hemolyzed - noted hyperK but no evident hyperK findings on ECG at this time - we'll await CMP  to confirm these findings.  Also noting BUN and Cr elevation. [MT]  0944 Ammonia: 28 [MT]  0958 Paged  dr dolan nephrology [MT]  1000 Hyperkalemia medications ordered, BS antibiotic and 1 liter IV bolus for sepsis unclear infectious source [MT]  1006 Dr dolan at bedside [MT]  1013 Paged critical care [MT]  1020 [facility] consulted for admission [MT]  1140 ICU team feels patient would be stable for stepdown bed - will page for unassigned admission.  ICU and nephrology coordinating dialysis access (possibly with IR) and dialysis needs [MT]  1340 Dialysis access line placed by ICU team - awaiting callback from hospital team for admission. Palliative team consulted by ICU [MT]  1352 Admitted to hospitalist Dr Seena [MT]    Clinical Course User Index [MT] Cottie Donnice PARAS, MD                                 Medical Decision Making Amount and/or Complexity of Data Reviewed Labs: ordered. Decision-making details documented in ED Course. Radiology: ordered. ECG/medicine tests: ordered.  Risk OTC drugs. Prescription drug management. Decision regarding hospitalization.   This patient presents to the Emergency Department with complaint of altered mental status.  This involves an extensive number of treatment options, and is a complaint that carries with it a high risk of complications and morbidity.  The differential diagnosis includes hypoglycemia vs metabolic encephalopathy vs infection (including cystitis) vs ICH vs stroke vs polypharmacy vs other  I ordered, reviewed, and interpreted labs, including severe uremia, BUN 162, AKI with creatinine 3.3.  Hyperkalemia potassium 6.4.  White blood cell count 18.6.  Hemoglobin is stable and near baseline levels at 11.0.  Ammonia is normal.  INR elevated 2.4.  COVID and flu are negative.  UA is pending on admission I ordered medication IV fluids and antibiotics per sepsis protocol, broad-spectrum.  IV medication and oral medication for hyperkalemia I ordered imaging studies which included CT scan of the head and x-ray of the chest I  independently visualized and interpreted imaging which showed no emergent findings Additional history was obtained from EMS, and the patient's wife at bedside Previous records obtained and reviewed showing outpatient office visits with cardiology and PCP I personally reviewed the patients ECG which showed sinus rhythm with no acute ischemic findings  I consulted nephrology and critical care and discussed lab and imaging findings.  Nephrologist planning for admission and potential CRRT versus dialysis for uremia.  Final consult pending.  ICU team to consult on patient regarding admission needs.       Final diagnoses:  Altered mental status, unspecified altered mental status type  Uremia  Acute renal failure, unspecified acute renal failure type (HCC)  Hyperkalemia  Sepsis, due to unspecified organism, unspecified whether acute organ dysfunction present (HCC)  Encephalopathy, unspecified type    ED Discharge Orders     None          Cottie Donnice PARAS, MD 03/22/24 1353

## 2024-03-22 NOTE — ED Triage Notes (Signed)
 Pt BIB [facility] from home d/t AMS. Baseline A&O X4. Since Monday, pt started slowly declining mentally and physically. Pt is usually able to walk around w a walker, but is no longer to do so. Hx liver failure and diabetes.   BP 98/62 HR 80S SPO2 95% RA  CBG 152

## 2024-03-22 NOTE — Consult Note (Addendum)
 [facility] Nephrology Consultation Note  Requesting MD: Dr. Cottie Cough Reason for consult: AKI on CKD, Hyperkalemia  HPI:  Darren [name] is a 83 y.o. male with past medical history significant for hypertension, atrial fibrillation on anticoagulation, acid reflux, gout, OSA, liver cirrhosis with ascites for which he gets frequent paracentesis presented with altered mental status, seen as a consultation for AKI on CKD and hyperkalemia. He has history of CKD stage III with baseline creatinine level around 1.3 -1.4 in 03/2023.  He receives frequent paracentesis the last on e was on 9//2025 with 5.3 L fluid removal.  He was seen by nephrologist Dr. Melia yesterday for it seems like worsening renal failure and frequent ascites.  The dose of spironolactone  was increased to 50 mg and changed furosemide  to daily.  Apparently he is off of losartan  for about a month now. The patient's wife reported that he has been altered for the last few days to weeks and it is getting worse to the point that he cannot to his daily activities.  He usually walks with a walker and able to do his puzzles but lately he cannot do any of those.   In the ER, he was afebrile, blood pressure 106/82, and room air.  The labs showed sodium 132, potassium 6.4, BUN 162 with creatinine level 3.8.  He is severely acidotic with very low albumin .  Also noted leukocytosis and thrombocytosis. He received Lokelma , sodium bicarbonate  50 mEq, insulin  dextrose , calcium .  Being treating with empiric antibiotics including vancomycin , cefepime  and Flagyl . The patient was accompanied by his wife at the bedside.  He looks confused, frail and dry mucous membrane.  Also concerned about cold clammy lower extremities. He is being admitted for further evaluation.  PMHx:   Past Medical History:  Diagnosis Date   Arthritis    maybe a little   Asthma    mild   Atrial fibrillation (HCC)    Atypical mole 08/16/2011   left upper back  tx exc atypical prol.   Atypical mole 12/18/2012   solar lentigo atypical tx w/s   bilateral leg edema 09/11/2018   Cancer (HCC)    prostate cancer   CHF (congestive heart failure) (HCC)    resolved with low salt diet   Dyspnea    Dyspnea on exertion 09/11/2018   Dysrhythmia    Afib with rapid ventricular   Essential hypertension 07/25/2018   Fatty liver    GERD (gastroesophageal reflux disease)    GI bleed 2018   Gout    History of blood transfusion 2018   Obstructive sleep apnea 09/11/2018   OSA (obstructive sleep apnea)    Paroxysmal atrial fibrillation (HCC) 09/11/2018   Pre-diabetes     Past Surgical History:  Procedure Laterality Date   CARDIOVERSION N/A 10/08/2018   Procedure: CARDIOVERSION;  Surgeon: Ladona Heinz, MD;  Location: [facility] ENDOSCOPY;  Service: Cardiovascular;  Laterality: N/A;   CARDIOVERSION N/A 01/17/2019   Procedure: CARDIOVERSION;  Surgeon: Ladona Heinz, MD;  Location: [facility] ENDOSCOPY;  Service: Cardiovascular;  Laterality: N/A;   COLONOSCOPY Left 03/10/2017   Procedure: COLONOSCOPY;  Surgeon: Burnette Fallow, MD;  Location: [facility] ENDOSCOPY;  Service: Endoscopy;  Laterality: Left;   ESOPHAGOGASTRODUODENOSCOPY (EGD) WITH PROPOFOL  Left 03/09/2017   Procedure: ESOPHAGOGASTRODUODENOSCOPY (EGD) WITH PROPOFOL ;  Surgeon: Saintclair Jasper, MD;  Location: [facility] ENDOSCOPY;  Service: Gastroenterology;  Laterality: Left;   EXCISION MASS HEAD Right 03/21/2023   Procedure: EXCISION OF NOSE MOHS DEFECT WITH REPAIR;  Surgeon: Luciano Standing, MD;  Location: [facility] OR;  Service: ENT;  Laterality: Right;   EYE SURGERY Bilateral    cataract    FINGER SURGERY Right    5th   IR PARACENTESIS  12/18/2023   IR PARACENTESIS  01/08/2024   IR PARACENTESIS  02/11/2024   IR PARACENTESIS  03/13/2024   Prostate     DaVinci   SKIN FULL THICKNESS GRAFT Right 03/21/2023   Procedure: ADJACENT TISSUE TRANSFER;  Surgeon: Luciano Standing, MD;  Location: [facility] OR;  Service: ENT;  Laterality: Right;    Family Hx:  Family  History  Problem Relation Age of Onset   Emphysema Father        smoked    Social History:  reports that he quit smoking about 35 years ago. His smoking use included cigarettes. He started smoking about 65 years ago. He has a 30 pack-year smoking history. He has never used smokeless tobacco. He reports current alcohol  use of about 25.0 standard drinks of alcohol  per week. He reports that he does not use drugs.  Allergies:  Allergies  Allergen Reactions   Amlodipine      Leg edema   Penicillins Hives    Medications: Prior to Admission medications   Medication Sig Start Date End Date Taking? Authorizing Provider  allopurinol  (ZYLOPRIM ) 100 MG tablet Take 100 mg by mouth daily.     [provider]  apixaban  (ELIQUIS ) 5 MG TABS tablet TAKE 1 TABLET TWICE A DAY 01/30/24   Ladona Heinz, MD  atorvastatin  (LIPITOR) 40 MG tablet Take 40 mg by mouth daily.  05/05/18   [provider]  azelastine  (ASTELIN ) 0.1 % nasal spray Place 2 sprays into both nostrils 2 (two) times daily. Use in each nostril as directed 11/06/23   Tobie Eldora NOVAK, MD  Cholecalciferol (VITAMIN D) 50 MCG (2000 UT) tablet Take 2,000 Units by mouth daily.    [provider]  colesevelam  (WELCHOL ) 625 MG tablet Take 1,875 mg by mouth 2 (two) times daily with a meal.     [provider]  fluticasone  (FLONASE ) 50 MCG/ACT nasal spray PLACE 2 SPRAYS INTO BOTH NOSTRILS DAILY. USE FOR 6-8 WEEKS 01/14/24   Tobie Eldora NOVAK, MD  folic acid  (FOLVITE ) 1 MG tablet Take 1 tablet (1 mg total) by mouth daily. 06/21/22   Jadine Toribio SQUIBB, MD  furosemide  (LASIX ) 40 MG tablet Take 40 mg by mouth daily as needed for edema.    [provider]  hydrALAZINE  (APRESOLINE ) 50 MG tablet Take 1 tablet (50 mg total) by mouth 3 (three) times daily. 05/23/23   Ladona Heinz, MD  ibuprofen (ADVIL) 200 MG tablet Take 400-600 mg by mouth every 6 (six) hours as needed for moderate pain.    [provider]  isosorbide   dinitrate (ISORDIL ) 30 MG tablet Take 1 tablet (30 mg total) by mouth 3 (three) times daily. 07/26/23   Ladona Heinz, MD  L-Methylfolate-Algae-B12-B6 [facility]) 3-90.314-2-35 MG CAPS Take 1 tablet by mouth 2 (two) times daily.     [provider]  losartan  (COZAAR ) 25 MG tablet TAKE 1 TABLET BY MOUTH EVERYDAY AT BEDTIME 03/06/24   Ladona Heinz, MD  Magnesium 400 MG TABS Take 400 mg by mouth daily at 12 noon.     [provider]  metoprolol  tartrate (LOPRESSOR ) 25 MG tablet Take 1 tablet (25 mg total) by mouth 2 (two) times daily. 05/23/23   Ladona Heinz, MD  mometasone -formoterol  (DULERA ) 100-5 MCG/ACT AERO USE 2 INHALATIONS TWICE A DAY 03/11/24   Wert,  Ozell NOVAK, MD  Multiple Vitamin (MULTIVITAMIN WITH MINERALS) TABS tablet Take 1 tablet by mouth daily. 06/21/22   Jadine Toribio SQUIBB, MD  omeprazole  (PRILOSEC) 20 MG capsule Take 20 mg by mouth 2 (two) times daily before a meal.    [provider]  Polyethyl Glycol-Propyl Glycol (SYSTANE OP) Place 1 drop into both eyes 3 (three) times daily as needed (dry eyes).    [provider]  thiamine  (VITAMIN B-1) 100 MG tablet Take 100 mg by mouth daily.    [provider]    I have reviewed the patient's current medications.  Labs: Renal Panel: Recent Labs  Lab 03/22/24 0852 03/22/24 0929  NA 131* 132*  K 6.4* 6.4*  CL 108 114*  CO2 11*  --   GLUCOSE 149* 143*  BUN 162* >130*  CREATININE 3.34* 3.80*  CALCIUM  9.2  --      CBC:    Latest Ref Rng & Units 03/22/2024    9:29 AM 03/22/2024    8:52 AM 03/14/2023   10:33 AM  CBC  WBC 4.0 - 10.5 K/uL  18.6  6.3   Hemoglobin 13.0 - 17.0 g/dL 88.0  88.9  88.7   Hematocrit 39.0 - 52.0 % 35.0  35.8  34.5   Platelets 150 - 400 K/uL  720  246      Anemia Panel:  Recent Labs    03/22/24 0852 03/22/24 0929  HGB 11.0* 11.9*  MCV 89.9  --     Recent Labs  Lab 03/22/24 0852  AST 32  ALT 24  ALKPHOS 142*  BILITOT 0.3  PROT 6.5  ALBUMIN  1.7*    No results  found for: HGBA1C  ROS: Unable to obtain review of system because patient is encephalopathic and confused.  Physical Exam: Vitals:   03/22/24 0859 03/22/24 0900  BP:  106/82  Pulse:  93  Resp:  18  Temp: (!) 96.7 F (35.9 C)   SpO2:  100%     General exam: Ill looking confused male with dry oral mucous membrane, not in distress Respiratory system: Clear to auscultation. Respiratory effort normal. No wheezing or crackle Cardiovascular system: S1 & S2 heard, RRR.  No pedal edema. Gastrointestinal system: Abdomen is distended, soft and nontender. Normal bowel sounds heard. Central nervous system: Opening eyes but overall confused. Extremities: Cold lower extremities, no peripheral edema.   Skin: Mottled and diffuse rash in lower extremities Psychiatry: Confused and encephalopathic.   Assessment/Plan:  # Acute kidney injury on CKD IIIa with severe uremic encephalopathy, hyperkalemia and acidosis: Likely due to reduced effective circulatory volume causing low renal perfusion caused by combination of recent paracentesis, use of spironolactone , diuretics. Also possibility of HRS. It seems like the dose of Aldactone  and diuretics increase recently to manage recurrent ascites. -check UA, urine Na and kidney US . - Plan to start dialysis for severe uremic encephalopathy and multiple electrolytes abnormalities.  IR was consulted for HD catheter placement.  ICU team is following as well.  May need CRRT if unable to tolerate IHD.  We will do slow HD because of risk of disequilibrium syndrome. - Volume is not the issue therefore no UF.  Received a liter of IV fluid. Adding albumin  and midodrine . Recommend to hold Aldactone  and Lasix . -strict I/o, daily lab.  # Hyperkalemia due to AKI, Aldactone : Received medical treatment in ER.  Planning dialysis as discussed above.  # Severe metabolic acidosis with AKI: RRT as above.  # Presumed sepsis, unknown source: Getting  blood cultures and receiving  empiric antibiotics.  Being admitted.  # History of liver cirrhosis, ascites, hypoalbuminemia requiring frequent paracentesis.  Discussed with ICU provider, ER provider, patient's wife.  Lindel Marcell Amelie Romney 03/22/2024, 10:06 AM  [facility].

## 2024-03-22 NOTE — Procedures (Signed)
 Paracentesis Procedure Note  CAMAURI CRATON  [id number]  08-Sep-1940  Date:03/22/24  Time:12:27 PM   Provider Performing:Scottie Metayer C Iviana Blasingame    Procedure: Paracentesis with imaging guidance (50916)  Indication(s) Ascites  Consent Risks of the procedure as well as the alternatives and risks of each were explained to the patient and/or caregiver.  Consent for the procedure was obtained and is signed in the bedside chart  Anesthesia Topical only with 1% lidocaine     Time Out Verified patient identification, verified procedure, site/side was marked, verified correct patient position, special equipment/implants available, medications/allergies/relevant history reviewed, required imaging and test results available.   Sterile Technique Maximal sterile technique including full sterile barrier drape, hand hygiene, sterile gown, sterile gloves, mask, hair covering, sterile ultrasound probe cover (if used).   Procedure Description Ultrasound used to identify appropriate peritoneal anatomy for placement and overlying skin marked.  Area of drainage cleaned and draped in sterile fashion. Lidocaine  was used to anesthetize the skin and subcutaneous tissue.  50 cc's of amber appearing fluid was drained. Catheter then removed and bandaid applied to site.   Complications/Tolerance None; patient tolerated the procedure well.   EBL Minimal   Specimen(s) Peritoneal fluid

## 2024-03-23 ENCOUNTER — Other Ambulatory Visit: Payer: Self-pay

## 2024-03-23 ENCOUNTER — Encounter (HOSPITAL_COMMUNITY): Payer: Self-pay | Admitting: Internal Medicine

## 2024-03-23 DIAGNOSIS — N179 Acute kidney failure, unspecified: Secondary | ICD-10-CM | POA: Diagnosis not present

## 2024-03-23 DIAGNOSIS — Z515 Encounter for palliative care: Secondary | ICD-10-CM

## 2024-03-23 DIAGNOSIS — Z7189 Other specified counseling: Secondary | ICD-10-CM

## 2024-03-23 DIAGNOSIS — R4182 Altered mental status, unspecified: Secondary | ICD-10-CM

## 2024-03-23 DIAGNOSIS — Z66 Do not resuscitate: Secondary | ICD-10-CM

## 2024-03-23 DIAGNOSIS — N189 Chronic kidney disease, unspecified: Secondary | ICD-10-CM | POA: Diagnosis not present

## 2024-03-23 DIAGNOSIS — Z711 Person with feared health complaint in whom no diagnosis is made: Secondary | ICD-10-CM

## 2024-03-23 LAB — CBC
HCT: 24.3 % — ABNORMAL LOW (ref 39.0–52.0)
HCT: 25.4 % — ABNORMAL LOW (ref 39.0–52.0)
Hemoglobin: 7.8 g/dL — ABNORMAL LOW (ref 13.0–17.0)
Hemoglobin: 8.1 g/dL — ABNORMAL LOW (ref 13.0–17.0)
MCH: 28 pg (ref 26.0–34.0)
MCH: 28 pg (ref 26.0–34.0)
MCHC: 32.1 g/dL (ref 30.0–36.0)
MCHC: 31.9 g/dL (ref 30.0–36.0)
MCV: 87.1 fL (ref 80.0–100.0)
MCV: 87.9 fL (ref 80.0–100.0)
Platelets: 468 K/uL — ABNORMAL HIGH (ref 150–400)
Platelets: 473 K/uL — ABNORMAL HIGH (ref 150–400)
RBC: 2.79 MIL/uL — ABNORMAL LOW (ref 4.22–5.81)
RBC: 2.89 MIL/uL — ABNORMAL LOW (ref 4.22–5.81)
RDW: 16.9 % — ABNORMAL HIGH (ref 11.5–15.5)
RDW: 17.2 % — ABNORMAL HIGH (ref 11.5–15.5)
WBC: 12.6 K/uL — ABNORMAL HIGH (ref 4.0–10.5)
WBC: 12.1 K/uL — ABNORMAL HIGH (ref 4.0–10.5)
nRBC: 0.2 % (ref 0.0–0.2)
nRBC: 0 % (ref 0.0–0.2)

## 2024-03-23 LAB — RENAL FUNCTION PANEL
Albumin: 2.4 g/dL — ABNORMAL LOW (ref 3.5–5.0)
Anion gap: 14 (ref 5–15)
BUN: 108 mg/dL — ABNORMAL HIGH (ref 8–23)
CO2: 16 mmol/L — ABNORMAL LOW (ref 22–32)
Calcium: 8.4 mg/dL — ABNORMAL LOW (ref 8.9–10.3)
Chloride: 106 mmol/L (ref 98–111)
Creatinine, Ser: 2.44 mg/dL — ABNORMAL HIGH (ref 0.61–1.24)
GFR, Estimated: 26 mL/min — ABNORMAL LOW (ref >60)
Glucose, Bld: 102 mg/dL — ABNORMAL HIGH (ref 70–99)
Phosphorus: 5 mg/dL — ABNORMAL HIGH (ref 2.5–4.6)
Potassium: 4.6 mmol/L (ref 3.5–5.1)
Sodium: 136 mmol/L (ref 135–145)

## 2024-03-23 LAB — URINALYSIS, W/ REFLEX TO CULTURE (INFECTION SUSPECTED)
Bilirubin Urine: NEGATIVE
Glucose, UA: NEGATIVE mg/dL
Hgb urine dipstick: NEGATIVE
Ketones, ur: NEGATIVE mg/dL
Leukocytes,Ua: NEGATIVE
Nitrite: NEGATIVE
Protein, ur: NEGATIVE mg/dL
Specific Gravity, Urine: 1.015 (ref 1.005–1.030)
pH: 5 (ref 5.0–8.0)

## 2024-03-23 LAB — HEPATIC FUNCTION PANEL
ALT: 18 U/L (ref 0–44)
AST: 34 U/L (ref 15–41)
Albumin: 2.4 g/dL — ABNORMAL LOW (ref 3.5–5.0)
Alkaline Phosphatase: 97 U/L (ref 38–126)
Bilirubin, Direct: 0.1 mg/dL (ref 0.0–0.2)
Indirect Bilirubin: 0.5 mg/dL (ref 0.3–0.9)
Total Bilirubin: 0.6 mg/dL (ref 0.0–1.2)
Total Protein: 5.7 g/dL — ABNORMAL LOW (ref 6.5–8.1)

## 2024-03-23 LAB — PROTEIN / CREATININE RATIO, URINE
Creatinine, Urine: 85 mg/dL
Protein Creatinine Ratio: 0.75 mg/mg{creat} — ABNORMAL HIGH (ref 0.00–0.15)
Total Protein, Urine: 64 mg/dL

## 2024-03-23 LAB — GLUCOSE, CAPILLARY
Glucose-Capillary: 105 mg/dL — ABNORMAL HIGH (ref 70–99)
Glucose-Capillary: 106 mg/dL — ABNORMAL HIGH (ref 70–99)
Glucose-Capillary: 108 mg/dL — ABNORMAL HIGH (ref 70–99)
Glucose-Capillary: 112 mg/dL — ABNORMAL HIGH (ref 70–99)
Glucose-Capillary: 87 mg/dL (ref 70–99)
Glucose-Capillary: 93 mg/dL (ref 70–99)

## 2024-03-23 LAB — HEMOGLOBIN AND HEMATOCRIT, BLOOD
HCT: 23.7 % — ABNORMAL LOW (ref 39.0–52.0)
Hemoglobin: 7.5 g/dL — ABNORMAL LOW (ref 13.0–17.0)

## 2024-03-23 LAB — SODIUM, URINE, RANDOM: Sodium, Ur: <30 mmol/L

## 2024-03-23 LAB — PROTIME-INR
INR: 2.8 — ABNORMAL HIGH (ref 0.8–1.2)
Prothrombin Time: 31 s — ABNORMAL HIGH (ref 11.4–15.2)

## 2024-03-23 MED ORDER — ANTICOAGULANT SODIUM CITRATE 4% (200MG/5ML) IV SOLN: 5.0000 mL | Status: DC | PRN

## 2024-03-23 MED ORDER — HEPARIN SODIUM (PORCINE) 1000 UNIT/ML DIALYSIS: 1000.0000 [IU] | INTRAMUSCULAR | Status: DC | PRN

## 2024-03-23 MED ORDER — ORAL CARE MOUTH RINSE
15.0000 mL | OROMUCOSAL | Status: DC
  Administered 2024-03-23 – 2024-04-01 (×34): 15 mL via OROMUCOSAL

## 2024-03-23 MED ORDER — ORAL CARE MOUTH RINSE: 15.0000 mL | OROMUCOSAL | Status: DC | PRN

## 2024-03-23 MED ORDER — RIFAXIMIN 550 MG PO TABS
550.0000 mg | ORAL_TABLET | Freq: Two times a day (BID) | ORAL | Status: DC
  Administered 2024-03-23 – 2024-04-01 (×20): 550 mg via ORAL
  Filled 2024-03-23 (×22): qty 1

## 2024-03-23 MED ORDER — CHLORHEXIDINE GLUCONATE CLOTH 2 % EX PADS
6.0000 | MEDICATED_PAD | Freq: Every day | CUTANEOUS | Status: DC
  Administered 2024-03-24 – 2024-03-26 (×3): 6 via TOPICAL

## 2024-03-23 MED ORDER — LIDOCAINE-PRILOCAINE 2.5-2.5 % EX CREA: 1.0000 | TOPICAL_CREAM | CUTANEOUS | Status: DC | PRN

## 2024-03-23 MED ORDER — LIDOCAINE HCL (PF) 1 % IJ SOLN: 5.0000 mL | INTRAMUSCULAR | Status: DC | PRN

## 2024-03-23 MED ORDER — ALTEPLASE 2 MG IJ SOLR: 2.0000 mg | Freq: Once | INTRAMUSCULAR | Status: DC | PRN

## 2024-03-23 MED ORDER — PENTAFLUOROPROP-TETRAFLUOROETH EX AERO: 1.0000 | INHALATION_SPRAY | CUTANEOUS | Status: DC | PRN

## 2024-03-23 MED ORDER — HEPARIN SODIUM (PORCINE) 1000 UNIT/ML IJ SOLN
INTRAMUSCULAR | Status: AC
Start: 2024-03-23 — End: 2024-03-23
  Filled 2024-03-23: qty 4

## 2024-03-23 NOTE — Consult Note (Signed)
 [facility] Consultation Note  Referring Provider: [facility] Primary Care Physician:  [name], MD Primary Gastroenterologist:  Dr. Burnette  Reason for Consultation:  altered mental status, cirrhosis  HPI: Darren [name] is a 83 y.o. male admitted for several days of altered mental status and progressive weakness.  No abdominal distention, hematemesis, melena.  Is confused and not mentally sharp over past several days.  History of known cirrhosis with ascites, for which he has required a few paracentesis procedures, including a diagnostic paracentesis yesterday, which showed > 2,000 WBCs and 48% neutrophils.   Past Medical History:  Diagnosis Date   Acute respiratory failure with hypoxia (HCC) 06/16/2022   Arthritis    maybe a little   Asthma    mild   Atrial fibrillation (HCC)    Atypical mole 08/16/2011   left upper back tx exc atypical prol.   Atypical mole 12/18/2012   solar lentigo atypical tx w/s   bilateral leg edema 09/11/2018   Cancer (HCC)    prostate cancer   CHF (congestive heart failure) (HCC)    resolved with low salt diet   COPD with acute exacerbation (HCC) 06/16/2022   Dyspnea    Dyspnea on exertion 09/11/2018   Dysrhythmia    Afib with rapid ventricular   Essential hypertension 07/25/2018   Fatty liver    GERD (gastroesophageal reflux disease)    GI bleed 2018   Gout    History of blood transfusion 2018   Obstructive sleep apnea 09/11/2018   OSA (obstructive sleep apnea)    Paroxysmal atrial fibrillation (HCC) 09/11/2018   Pre-diabetes     Past Surgical History:  Procedure Laterality Date   CARDIOVERSION N/A 10/08/2018   Procedure: CARDIOVERSION;  Surgeon: Ladona Heinz, MD;  Location: [facility] ENDOSCOPY;  Service: Cardiovascular;  Laterality: N/A;   CARDIOVERSION N/A 01/17/2019   Procedure: CARDIOVERSION;  Surgeon: Ladona Heinz, MD;  Location: [facility] ENDOSCOPY;  Service: Cardiovascular;  Laterality: N/A;   COLONOSCOPY Left 03/10/2017    Procedure: COLONOSCOPY;  Surgeon: Burnette Fallow, MD;  Location: [facility] ENDOSCOPY;  Service: Endoscopy;  Laterality: Left;   ESOPHAGOGASTRODUODENOSCOPY (EGD) WITH PROPOFOL  Left 03/09/2017   Procedure: ESOPHAGOGASTRODUODENOSCOPY (EGD) WITH PROPOFOL ;  Surgeon: Saintclair Jasper, MD;  Location: [facility] ENDOSCOPY;  Service: Gastroenterology;  Laterality: Left;   EXCISION MASS HEAD Right 03/21/2023   Procedure: EXCISION OF NOSE MOHS DEFECT WITH REPAIR;  Surgeon: Luciano Standing, MD;  Location: [facility] OR;  Service: ENT;  Laterality: Right;   EYE SURGERY Bilateral    cataract    FINGER SURGERY Right    5th   IR PARACENTESIS  12/18/2023   IR PARACENTESIS  01/08/2024   IR PARACENTESIS  02/11/2024   IR PARACENTESIS  03/13/2024   Prostate     DaVinci   SKIN FULL THICKNESS GRAFT Right 03/21/2023   Procedure: ADJACENT TISSUE TRANSFER;  Surgeon: Luciano Standing, MD;  Location: [facility] OR;  Service: ENT;  Laterality: Right;    Prior to Admission medications   Medication Sig Start Date End Date Taking? Authorizing Provider  allopurinol  (ZYLOPRIM ) 100 MG tablet Take 100 mg by mouth daily.    Yes [provider]  apixaban  (ELIQUIS ) 5 MG TABS tablet TAKE 1 TABLET TWICE A DAY 01/30/24  Yes Ladona Heinz, MD  atorvastatin  (LIPITOR) 40 MG tablet Take 40 mg by mouth daily.  05/05/18  Yes [provider]  azelastine  (ASTELIN ) 0.1 % nasal spray Place 2 sprays into both nostrils 2 (two) times daily. Use in each nostril as directed  11/06/23  Yes Patel, Kunjan B, MD  Cholecalciferol (VITAMIN D) 50 MCG (2000 UT) tablet Take 2,000 Units by mouth daily.   Yes [provider]  colesevelam  (WELCHOL ) 625 MG tablet Take 1,875 mg by mouth 2 (two) times daily with a meal.    Yes [provider]  fluticasone  (FLONASE ) 50 MCG/ACT nasal spray PLACE 2 SPRAYS INTO BOTH NOSTRILS DAILY. USE FOR 6-8 WEEKS 01/14/24  Yes Tobie Comp B, MD  folic acid  (FOLVITE ) 1 MG tablet Take 1 tablet (1 mg total) by mouth daily. 06/21/22  Yes Jadine Toribio SQUIBB, MD  furosemide  (LASIX ) 40 MG tablet Take 40 mg by mouth daily as needed for edema.   Yes [provider]  ibuprofen (ADVIL) 200 MG tablet Take 400-600 mg by mouth every 6 (six) hours as needed for moderate pain.   Yes [provider]  isosorbide  dinitrate (ISORDIL ) 30 MG tablet Take 1 tablet (30 mg total) by mouth 3 (three) times daily. 07/26/23  Yes Ladona Heinz, MD  L-Methylfolate-Algae-B12-B6 [facility]) 3-90.314-2-35 MG CAPS Take 1 tablet by mouth 2 (two) times daily.    Yes [provider]  Magnesium 400 MG TABS Take 400 mg by mouth daily at 12 noon.    Yes [provider]  metoprolol  tartrate (LOPRESSOR ) 25 MG tablet Take 1 tablet (25 mg total) by mouth 2 (two) times daily. 05/23/23  Yes Ladona Heinz, MD  mometasone -formoterol  (DULERA ) 100-5 MCG/ACT AERO USE 2 INHALATIONS TWICE A DAY Patient taking differently: Inhale 2 puffs into the lungs in the morning and at bedtime. 03/11/24  Yes Darlean Ozell NOVAK, MD  Multiple Vitamin (MULTIVITAMIN WITH MINERALS) TABS tablet Take 1 tablet by mouth daily. 06/21/22  Yes Jadine Toribio SQUIBB, MD  omeprazole  (PRILOSEC) 20 MG capsule Take 20 mg by mouth 2 (two) times daily before a meal.   Yes [provider]  Polyethyl Glycol-Propyl Glycol (SYSTANE OP) Place 1 drop into both eyes 3 (three) times daily as needed (dry eyes).   Yes [provider]  spironolactone  (ALDACTONE ) 50 MG tablet Take 50 mg by mouth daily. 03/21/24  Yes [provider]  thiamine  (VITAMIN B-1) 100 MG tablet Take 100 mg by mouth daily.   Yes [provider]    Current Facility-Administered Medications  Medication Dose Route Frequency Provider Last Rate Last Admin   0.9 %  sodium chloride  infusion   Intravenous PRN Melvin, Alexander B, MD 10 mL/hr at 03/23/24 0300 Infusion Verify at 03/23/24 0300   acetaminophen  (TYLENOL ) tablet 650 mg  650 mg Oral Q6H PRN Seena Marsa NOVAK, MD       Or   acetaminophen  (TYLENOL )  suppository 650 mg  650 mg Rectal Q6H PRN Seena Marsa NOVAK, MD       apixaban  (ELIQUIS ) tablet 2.5 mg  2.5 mg Oral BID Seena Marsa NOVAK, MD   2.5 mg at 03/22/24 2147   artificial tears ophthalmic solution   Both Eyes TID PRN Melvin, Alexander B, MD       atorvastatin  (LIPITOR) tablet 40 mg  40 mg Oral Daily Melvin, Alexander B, MD       cefTRIAXone  (ROCEPHIN ) 2 g in sodium chloride  0.9 % 100 mL IVPB  2 g Intravenous Q24H Claudene Toribio BROCKS, MD   Stopped at 03/22/24 1313   Chlorhexidine  Gluconate Cloth 2 % PADS 6 each  6 each Topical Q0600 Dolan Mateo Larger, MD   6 each at 03/23/24 0041   colesevelam  (WELCHOL ) tablet 1,875 mg  1,875  mg Oral BID WC Melvin, Alexander B, MD   1,875 mg at 03/22/24 2147   fluticasone  furoate-vilanterol (BREO ELLIPTA ) 100-25 MCG/ACT 1 puff  1 puff Inhalation Daily Seena Marsa NOVAK, MD   1 puff at 03/23/24 0851   folic acid  (FOLVITE ) tablet 1 mg  1 mg Oral Daily Seena Marsa NOVAK, MD       Influenza vac split trivalent PF (FLUZONE HIGH-DOSE) injection 0.5 mL  0.5 mL Intramuscular Prior to discharge Seena Marsa NOVAK, MD       midodrine  (PROAMATINE ) tablet 10 mg  10 mg Oral TID WC Claudene Toribio BROCKS, MD   10 mg at 03/22/24 2146   octreotide  (SANDOSTATIN ) injection 100 mcg  100 mcg Subcutaneous Q8H Claudene Toribio BROCKS, MD   100 mcg at 03/23/24 0544   Oral care mouth rinse  15 mL Mouth Rinse 4 times per day Seena Marsa NOVAK, MD       Oral care mouth rinse  15 mL Mouth Rinse PRN Seena Marsa NOVAK, MD       pantoprazole  (PROTONIX ) EC tablet 40 mg  40 mg Oral BID AC Melvin, Alexander B, MD   40 mg at 03/22/24 2146   polyethylene glycol (MIRALAX  / GLYCOLAX ) packet 17 g  17 g Oral Daily PRN Seena Marsa NOVAK, MD       sodium chloride  flush (NS) 0.9 % injection 3 mL  3 mL Intravenous Q12H Seena Marsa NOVAK, MD   3 mL at 03/22/24 2137   thiamine  (VITAMIN B1) tablet 100 mg  100 mg Oral Daily Seena Marsa NOVAK, MD        Allergies as of 03/22/2024 - Review Complete  03/22/2024  Allergen Reaction Noted   Amlodipine   10/17/2018   Penicillins Hives 03/30/2016    Family History  Problem Relation Age of Onset   Emphysema Father        smoked    Social History   Socioeconomic History   Marital status: Married    Spouse name: Not on file   Number of children: 4   Years of education: Not on file   Highest education level: Not on file  Occupational History   Not on file  Tobacco Use   Smoking status: Former    Current packs/day: 0.00    Average packs/day: 1 pack/day for 30.0 years (30.0 ttl pk-yrs)    Types: Cigarettes    Start date: 65    Quit date: 07/10/1988    Years since quitting: 35.7   Smokeless tobacco: Never  Vaping Use   Vaping status: Never Used  Substance and Sexual Activity   Alcohol  use: Yes    Alcohol /week: 25.0 standard drinks of alcohol     Types: 25 Shots of liquor per week    Comment: Daily    Drug use: No   Sexual activity: Not on file  Other Topics Concern   Not on file  Social History Narrative   Not on file   Social Drivers of Health   Financial Resource Strain: Not on file  Food Insecurity: No Food Insecurity (03/22/2024)   Hunger Vital Sign    Worried About Running Out of Food in the Last Year: Never true    Ran Out of Food in the Last Year: Never true  Transportation Needs: No Transportation Needs (03/22/2024)   PRAPARE - Administrator, Civil Service (Medical): No    Lack of Transportation (Non-Medical): No  Physical Activity: Not on file  Stress: Not on file  Social Connections: Patient Unable To Answer (03/22/2024)   Social Connection and Isolation Panel    Frequency of Communication with Friends and Family: Patient unable to answer    Frequency of Social Gatherings with Friends and Family: Patient unable to answer    Attends Religious Services: Patient unable to answer    Active Member of Clubs or Organizations: Patient unable to answer    Attends Banker Meetings: Patient  unable to answer    Marital Status: Patient unable to answer  Intimate Partner Violence: Patient Unable To Answer (03/22/2024)   Humiliation, Afraid, Rape, and Kick questionnaire    Fear of Current or Ex-Partner: Patient unable to answer    Emotionally Abused: Patient unable to answer    Physically Abused: Patient unable to answer    Sexually Abused: Patient unable to answer    Review of Systems: Unable to obtain due to altered mental status  Physical Exam: Vital signs in last 24 hours: Temp:  [96.1 F (35.6 C)-98.7 F (37.1 C)] 98.7 F (37.1 C) (09/14 0738) Pulse Rate:  [33-122] 87 (09/14 0738) Resp:  [14-24] 15 (09/14 0738) BP: (81-119)/(49-80) 99/60 (09/14 0738) SpO2:  [94 %-100 %] 98 % (09/14 0738) FiO2 (%):  [2 %-28 %] 28 % (09/14 0900) Weight:  [79.2 kg-80.3 kg] 79.2 kg (09/14 0400) Last BM Date : 03/23/24 General:   Awake, answers some question, frail and moderately ill-appearing Head:  Normocephalic and atraumatic. Eyes:  Sclera clear, no icterus.   Conjunctiva pink. Ears:  Normal auditory acuity. Nose:  No deformity, discharge,  or lesions. Mouth:  No deformity or lesions.  Oropharynx pink & moist. Neck:  Supple; no masses or thyromegaly. Lungs:  No visible respiratory distress Abdomen:  Soft, nontender and nondistended. No masses, hepatosplenomegaly or hernias noted. No guarding, and without rebound.     Msk:  Symmetrical without gross deformities. Normal posture. Pulses:  Normal pulses noted. Extremities:  Without clubbing or edema. Neurologic:  Alert to year and place only; vacillating mental status. Skin:  Intact without significant lesions or rashes. Psych:  Alert but vacillating awareness   Lab Results: Recent Labs    03/22/24 0852 03/22/24 0929 03/23/24 0517 03/23/24 0825  WBC 18.6*  --  12.6*  --   HGB 11.0* 11.9* 7.8* 7.5*  HCT 35.8* 35.0* 24.3* 23.7*  PLT 720*  --  468*  --    BMET Recent Labs    03/22/24 0852 03/22/24 0929 03/23/24 0518   NA 131* 132* 136  K 6.4* 6.4* 4.6  CL 108 114* 106  CO2 11*  --  16*  GLUCOSE 149* 143* 102*  BUN 162* >130* 108*  CREATININE 3.34* 3.80* 2.44*  CALCIUM  9.2  --  8.4*   LFT Recent Labs    03/23/24 0517 03/23/24 0518  PROT 5.7*  --   ALBUMIN  2.4* 2.4*  AST 34  --   ALT 18  --   ALKPHOS 97  --   BILITOT 0.6  --   BILIDIR 0.1  --   IBILI 0.5  --    PT/INR Recent Labs    03/22/24 0852 03/23/24 0517  LABPROT 27.7* 31.0*  INR 2.4* 2.8*    Studies/Results: US  RENAL Result Date: 03/22/2024 EXAM: US  Retroperitoneum Complete, Renal. CLINICAL HISTORY: 409830 AKI (acute kidney injury) (HCC) 409830. AKI (acute kidney injury) (HCC) TECHNIQUE: Real-time ultrasound of the retroperitoneum (complete) with image documentation. COMPARISON: None provided. FINDINGS: RIGHT KIDNEY: Measures 11.9 x 6.6 x 7.7 cm (319.4 cc).  7.2 cm upper pole right renal cyst. No hydronephrosis or mass visualized. LEFT KIDNEY: Not well visualized. BLADDER: Unremarkable as visualized. OTHER FINDINGS: Small - moderate ascites. IMPRESSION: 1. Right upper pole renal cyst measuring 7.2 cm. 2. Small to moderate ascites. Electronically signed by: Dayne Hassell MD 03/22/2024 03:35 PM EDT RP Workstation: [id number]   DG Chest Port 1 View Result Date: 03/22/2024 EXAM: 1 VIEW XRAY OF THE CHEST 03/22/2024 01:07:00 PM COMPARISON: 03/22/2024 CLINICAL HISTORY: Encounter for central line placement FINDINGS: LINES, TUBES AND DEVICES: Right IJ CVC in place with tip in mid SVC. LUNGS AND PLEURA: No focal pulmonary opacity. No pulmonary edema. No pleural effusion. No pneumothorax. HEART AND MEDIASTINUM: No acute abnormality of the cardiac and mediastinal silhouettes. Aortic calcification. BONES AND SOFT TISSUES: No acute osseous abnormality. IMPRESSION: 1. Right IJ CVC in place with tip in mid SVC. No pneumothorax after catheter placement. Electronically signed by: Waddell Calk MD 03/22/2024 01:36 PM EDT RP Workstation: [id number]   CT  Head Wo Contrast Result Date: 03/22/2024 CLINICAL DATA:  83 year old male with altered mental status, declining mentally over the past week. EXAM: CT HEAD WITHOUT CONTRAST TECHNIQUE: Contiguous axial images were obtained from the base of the skull through the vertex without intravenous contrast. RADIATION DOSE REDUCTION: This exam was performed according to the departmental dose-optimization program which includes automated exposure control, adjustment of the mA and/or kV according to patient size and/or use of iterative reconstruction technique. COMPARISON:  Paranasal sinus CT 11/29/2016. FINDINGS: Brain: Cerebral volume is within normal limits for age. No midline shift, ventriculomegaly, mass effect, evidence of mass lesion, intracranial hemorrhage or evidence of cortically based acute infarction. Gray-white differentiation within normal limits for age. Vascular: Calcified atherosclerosis at the skull base. No suspicious intracranial vascular hyperdensity. Skull: Intact, negative. Sinuses/Orbits: Visualized paranasal sinuses and mastoids are clear. Other: No acute orbit or scalp soft tissue finding. IMPRESSION: Normal for age noncontrast Head CT. Electronically Signed   By: VEAR Hurst M.D.   On: 03/22/2024 11:56   DG Chest Portable 1 View Result Date: 03/22/2024 EXAM: 1 VIEW XRAY OF THE CHEST 03/22/2024 09:29:00 AM COMPARISON: 06/15/2022 CLINICAL HISTORY: Infection evaluation - aspiration. Pt BIB [facility] from home d/t [facility]MS. Baseline A\T\O X4. Since Monday, pt started slowly declining mentally and physically. Pt is usually able to walk around w a walker, but is no longer to do so. Hx liver failure and diabetes. BP 98/62; HR 80S; SPO2 95% RA; CBG 152. FINDINGS: LUNGS AND PLEURA: No focal pulmonary opacity. No pulmonary edema. No pleural effusion. No pneumothorax. HEART AND MEDIASTINUM: No acute abnormality of the cardiac and mediastinal silhouettes. Mildly elevated left hemidiaphragm. BONES AND SOFT TISSUES: No acute  osseous abnormality. IMPRESSION: 1. No acute findings. 2. Mildly elevated left hemidiaphragm. Electronically signed by: Waddell Calk MD 03/22/2024 09:55 AM EDT RP Workstation: [id number]    Impression:   Acute renal failure.  Pre-renal azotemia (increase recently in diuretics) versus fluid shifts post-paracentesis versus hepatorenal syndrome versus combination of factors versus other. 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 Ascites with SBP on yesterday's studies. Atrial fibrillation, apixaban .  Plan:   INR is rising; not sure due to apixaban  or from liver disease; I would strongly advise stopping apixban and following INR daily, but defer decision to primary team. Xifaxan  500 mg po bid for possible hepatic encephalopathy, once/if able to tolerate oral pills. Follow INR and LFTs daily. Appreciate nephrology input and recommendations. Antibiotics (ceftriaxone  + vancomycin  currently) and albumin  (received 100 g yesterday, will  need another 100 g tomorrow) for SBP in setting of renal insufficiency dosing, pending cultures/speciation. Prognosis guarded.  Given patient's recent (within 2 months) use of alcohol  and possibility of infection (leading to altered mental status), notwithstanding his age and multiple comorbidities, I do not think patient is liver transplant at present time.   [facility] will follow.   LOS: 1 day   Loray Akard M  03/23/2024, 9:49 AM  Cell [phone] If no answer or after 5 PM call [phone]

## 2024-03-23 NOTE — Progress Notes (Addendum)
  [facility] PROGRESS NOTE  Assessment/ Plan: Pt is a 83 y.o. 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    # Acute kidney injury on CKD IIIa with severe uremic encephalopathy, hyperkalemia and acidosis: Likely due to combination of reduced effective circulatory volume caused by recent paracentesis, use of spironolactone , diuretics and HRS. It seems like the dose of Aldactone  and diuretics increase recently to manage recurrent ascites.  Kidney US  with no hydro. -Started dialysis on 9/13 for severe uremic encephalopathy and multiple metabolic abnormalities.  Reportedly he was at his normal mental status until about a week ago.  Hoping some improvement of mental status with dialysis however he may not be a good candidate for long-term outpatient dialysis.  We will try to do HD today if the nursing scheduling allows. - Volume status is not ICO therefore no UF.  Agree with continuing albumin , midodrine  octreotide . - Continue to hold Aldactone , Lasix . -strict I/o, daily lab.   # Hyperkalemia due to AKI, Aldactone : Treating with dialysis.   # Severe metabolic acidosis with AKI: RRT as above.   # Presumed sepsis, unknown source: Getting blood cultures and receiving empiric antibiotics.  Being admitted.   # History of liver cirrhosis, ascites, hypoalbuminemia requiring frequent paracentesis.  Seen by GI on Xifaxan .  # Anemia: Drop in hemoglobin noted, partly due to dilution after receiving fluid in ER.  He looked dry and dehydrated on presentation.  No previous sign of bleeding.  I will check iron  level.  Transfuse as needed.  Subjective: Seen and examined at bedside.  The mental status mildly improved compared to yesterday.  Urine  output is 630.  Received first HD yesterday and tolerated well.  No new event.  Objective Vital signs in last 24 hours: Vitals:   03/23/24 0843 03/23/24 0852 03/23/24 1006 03/23/24 1218  BP:  97/64  (!) 105/8  Pulse: 90 88 89 91  Resp: 20 17 18 20   Temp:    98.6 F (37 C)  TempSrc:    Oral  SpO2: 97% 98% 100% 100%  Weight:      Height:       Weight change:   Intake/Output Summary (Last 24 hours) at 03/23/2024 1237 Last data filed at 03/23/2024 0300 Gross per 24 hour  Intake 536.02 ml  Output 630 ml  Net -93.98 ml       Labs: RENAL PANEL Recent Labs  Lab 03/22/24 0852 03/22/24 0929 03/23/24 0517 03/23/24 0518  NA 131* 132*  --  136  K 6.4* 6.4*  --  4.6  CL 108 114*  --  106  CO2 11*  --   --  16*  GLUCOSE 149* 143*  --  102*  BUN 162* >130*  --  108*  CREATININE 3.34* 3.80*  --  2.44*  CALCIUM  9.2  --   --  8.4*  PHOS  --   --   --  5.0*  ALBUMIN  1.7*  --  2.4* 2.4*    Liver Function Tests: Recent Labs  Lab 03/22/24 0852 03/23/24 0517 03/23/24 0518  AST 32 34  --   ALT 24 18  --   ALKPHOS 142* 97  --   BILITOT 0.3 0.6  --   PROT 6.5 5.7*  --   ALBUMIN  1.7*  2.4* 2.4*   Recent Labs  Lab 03/22/24 0852  LIPASE 86*   Recent Labs  Lab 03/22/24 0852  AMMONIA 28   CBC: Recent Labs    03/22/24 0852 03/22/24 0929 03/23/24 0517 03/23/24 0825  HGB 11.0* 11.9* 7.8* 7.5*  MCV 89.9  --  87.1  --     Cardiac Enzymes: No results for input(s): CKTOTAL, CKMB, CKMBINDEX, TROPONINI in the last 168 hours. CBG: Recent Labs  Lab 03/22/24 2024 03/23/24 0017 03/23/24 0333 03/23/24 0747 03/23/24 1220  GLUCAP 120* 105* 112* 93 106*    Iron  Studies: No results for input(s): IRON , TIBC, TRANSFERRIN, FERRITIN in the last 72 hours. Studies/Results: US  RENAL Result Date: 03/22/2024 EXAM: US  Retroperitoneum Complete, Renal. CLINICAL HISTORY: 409830 AKI (acute kidney injury) (HCC) 409830. AKI (acute kidney injury) (HCC) TECHNIQUE: Real-time  ultrasound of the retroperitoneum (complete) with image documentation. COMPARISON: None provided. FINDINGS: RIGHT KIDNEY: Measures 11.9 x 6.6 x 7.7 cm (319.4 cc). 7.2 cm upper pole right renal cyst. No hydronephrosis or mass visualized. LEFT KIDNEY: Not well visualized. BLADDER: Unremarkable as visualized. OTHER FINDINGS: Small - moderate ascites. IMPRESSION: 1. Right upper pole renal cyst measuring 7.2 cm. 2. Small to moderate ascites. Electronically signed by: Dayne Hassell MD 03/22/2024 03:35 PM EDT RP Workstation: [id number]   DG Chest Port 1 View Result Date: 03/22/2024 EXAM: 1 VIEW XRAY OF THE CHEST 03/22/2024 01:07:00 PM COMPARISON: 03/22/2024 CLINICAL HISTORY: Encounter for central line placement FINDINGS: LINES, TUBES AND DEVICES: Right IJ CVC in place with tip in mid SVC. LUNGS AND PLEURA: No focal pulmonary opacity. No pulmonary edema. No pleural effusion. No pneumothorax. HEART AND MEDIASTINUM: No acute abnormality of the cardiac and mediastinal silhouettes. Aortic calcification. BONES AND SOFT TISSUES: No acute osseous abnormality. IMPRESSION: 1. Right IJ CVC in place with tip in mid SVC. No pneumothorax after catheter placement. Electronically signed by: Waddell Calk MD 03/22/2024 01:36 PM EDT RP Workstation: [id number]   CT Head Wo Contrast Result Date: 03/22/2024 CLINICAL DATA:  83 year old male with altered mental status, declining mentally over the past week. EXAM: CT HEAD WITHOUT CONTRAST TECHNIQUE: Contiguous axial images were obtained from the base of the skull through the vertex without intravenous contrast. RADIATION DOSE REDUCTION: This exam was performed according to the departmental dose-optimization program which includes automated exposure control, adjustment of the mA and/or kV according to patient size and/or use of iterative reconstruction technique. COMPARISON:  Paranasal sinus CT 11/29/2016. FINDINGS: Brain: Cerebral volume is within normal limits for age. No midline shift,  ventriculomegaly, mass effect, evidence of mass lesion, intracranial hemorrhage or evidence of cortically based acute infarction. Gray-white differentiation within normal limits for age. Vascular: Calcified atherosclerosis at the skull base. No suspicious intracranial vascular hyperdensity. Skull: Intact, negative. Sinuses/Orbits: Visualized paranasal sinuses and mastoids are clear. Other: No acute orbit or scalp soft tissue finding. IMPRESSION: Normal for age noncontrast Head CT. Electronically Signed   By: VEAR Hurst M.D.   On: 03/22/2024 11:56   DG Chest Portable 1 View Result Date: 03/22/2024 EXAM: 1 VIEW XRAY OF THE CHEST 03/22/2024 09:29:00 AM COMPARISON: 06/15/2022 CLINICAL HISTORY: Infection evaluation - aspiration. Pt BIB [facility] from home d/t AMS. Baseline A\T\O X4. Since Monday, pt started slowly declining mentally and physically. Pt is usually able to walk around w a walker, but is no longer to do so. Hx liver failure and diabetes. BP 98/62; HR 80S; SPO2 95% RA; CBG 152. FINDINGS: LUNGS AND PLEURA: No focal pulmonary opacity. No pulmonary edema. No pleural effusion.  No pneumothorax. HEART AND MEDIASTINUM: No acute abnormality of the cardiac and mediastinal silhouettes. Mildly elevated left hemidiaphragm. BONES AND SOFT TISSUES: No acute osseous abnormality. IMPRESSION: 1. No acute findings. 2. Mildly elevated left hemidiaphragm. Electronically signed by: Waddell Calk MD 03/22/2024 09:55 AM EDT RP Workstation: [id number]    Medications: Infusions:  sodium chloride  10 mL/hr at 03/23/24 0300   cefTRIAXone  (ROCEPHIN )  IV 2 g (03/23/24 1018)    Scheduled Medications:  apixaban   2.5 mg Oral BID   atorvastatin   40 mg Oral Daily   Chlorhexidine  Gluconate Cloth  6 each Topical Q0600   colesevelam   1,875 mg Oral BID WC   fluticasone  furoate-vilanterol  1 puff Inhalation Daily   folic acid   1 mg Oral Daily   midodrine   10 mg Oral TID WC   octreotide   100 mcg Subcutaneous Q8H   mouth rinse  15 mL  Mouth Rinse 4 times per day   pantoprazole   40 mg Oral BID AC   rifaximin   550 mg Oral BID   sodium chloride  flush  3 mL Intravenous Q12H   thiamine   100 mg Oral Daily    have reviewed scheduled and prn medications.  Physical Exam: General: Ill looking male, not in distress Heart:RRR, s1s2 nl Lungs:clear b/l, no crackle Abdomen:soft, Non-tender, +distended Extremities:No edema Dialysis Access: Temporary HD catheter placed by ICU team.  Paul Trettin Amelie Romney 03/23/2024,12:37 PM  LOS: 1 day

## 2024-03-23 NOTE — Progress Notes (Signed)
 Transport at bedside to take patient to HD at 1618.

## 2024-03-23 NOTE — Progress Notes (Signed)
   03/23/24 1922  Vitals  Temp 98.1 F (36.7 C)  Pulse Rate 100  Resp 16  BP 110/62  SpO2 100 %  O2 Device Nasal Cannula  Weight 79.2 kg  Type of Weight Post-Dialysis  Oxygen Therapy  O2 Flow Rate (L/min) 2 L/min  Post Treatment  Dialyzer Clearance Lightly streaked  Hemodialysis Intake (mL) 0 mL  Liters Processed 37.5  Fluid Removed (mL) 0 mL  Tolerated HD Treatment Yes   Received patient in bed to unit.  Alert and oriented.  Informed consent signed and in chart.   TX duration:2.5  Patient tolerated well.  Transported back to the room  Alert, without acute distress.  Hand-off given to patient's nurse.   Access used: [email or web address] Access issues: no complications Total UF removed: ran even per dx order Medication(s) given: none   Darren [name] [facility]

## 2024-03-23 NOTE — Plan of Care (Addendum)
 This patient remains on MC-6E as of time of writing. MEWS yellow overnight (see my other note). Patient's admission profile, and subsequent tasks, completed overnight by this RN. Multiple visitors present overnight.  Edit: 0200 hours: Reached out to RT at this time to set-up patient's HS CPAP, and again at 0230 hours. HS CPAP set-up by RT at 0400 hours.  Problem: Education: Goal: Knowledge of General Education information will improve Description: Including pain rating scale, medication(s)/side effects and non-pharmacologic comfort measures Outcome: Not Progressing   Problem: Health Behavior/Discharge Planning: Goal: Ability to manage health-related needs will improve Outcome: Not Progressing   Problem: Clinical Measurements: Goal: Ability to maintain clinical measurements within normal limits will improve Outcome: Not Progressing Goal: Will remain free from infection Outcome: Not Progressing Goal: Diagnostic test results will improve Outcome: Not Progressing Goal: Respiratory complications will improve Outcome: Not Progressing Goal: Cardiovascular complication will be avoided Outcome: Not Progressing   Problem: Activity: Goal: Risk for activity intolerance will decrease Outcome: Not Progressing   Problem: Nutrition: Goal: Adequate nutrition will be maintained Outcome: Not Progressing   Problem: Coping: Goal: Level of anxiety will decrease Outcome: Not Progressing   Problem: Elimination: Goal: Will not experience complications related to bowel motility Outcome: Not Progressing Goal: Will not experience complications related to urinary retention Outcome: Not Progressing   Problem: Pain Managment: Goal: General experience of comfort will improve and/or be controlled Outcome: Not Progressing   Problem: Safety: Goal: Ability to remain free from injury will improve Outcome: Not Progressing   Problem: Skin Integrity: Goal: Risk for impaired skin integrity will  decrease Outcome: Not Progressing   Problem: Education: Goal: Knowledge of disease and its progression will improve Outcome: Not Progressing Goal: Individualized Educational Video(s) Outcome: Not Progressing   Problem: Fluid Volume: Goal: Compliance with measures to maintain balanced fluid volume will improve Outcome: Not Progressing   Problem: Health Behavior/Discharge Planning: Goal: Ability to manage health-related needs will improve Outcome: Not Progressing   Problem: Nutritional: Goal: Ability to make healthy dietary choices will improve Outcome: Not Progressing   Problem: Clinical Measurements: Goal: Complications related to the disease process, condition or treatment will be avoided or minimized Outcome: Not Progressing   Problem: Education: Goal: Ability to demonstrate management of disease process will improve Outcome: Not Progressing Goal: Ability to verbalize understanding of medication therapies will improve Outcome: Not Progressing Goal: Individualized Educational Video(s) Outcome: Not Progressing   Problem: Activity: Goal: Capacity to carry out activities will improve Outcome: Not Progressing   Problem: Cardiac: Goal: Ability to achieve and maintain adequate cardiopulmonary perfusion will improve Outcome: Not Progressing   Problem: Education: Goal: Knowledge of disease or condition will improve Outcome: Not Progressing Goal: Understanding of medication regimen will improve Outcome: Not Progressing Goal: Individualized Educational Video(s) Outcome: Not Progressing   Problem: Activity: Goal: Ability to tolerate increased activity will improve Outcome: Not Progressing   Problem: Cardiac: Goal: Ability to achieve and maintain adequate cardiopulmonary perfusion will improve Outcome: Not Progressing   Problem: Health Behavior/Discharge Planning: Goal: Ability to safely manage health-related needs after discharge will improve Outcome: Not  Progressing

## 2024-03-23 NOTE — Consult Note (Signed)
 Palliative Medicine Inpatient Consult Note  Consulting Provider:  Claudene Toribio BROCKS, MD   Reason for consult:   Palliative Care Consult Services Palliative Medicine Consult  Reason for Consult? liver/heart/kidney failure, having dwindles   03/23/2024  HPI:  Per intake H&P --> Darren [name] is a 83 year old male with medical history significant for cirrhosis with abdominal ascites (last paracentesis 9/4, 5.3L removed), atrial fibrillation on Eliquis , CHF, essential hypertension, GERD, and OSA who presented to the ED at [facility] 9/13 with AMS. Darren [name] is presently being treated for hepatorenal syndrome. He is also noted to have significant cirrhosis with an increased MELD. Palliative care has been asked to support GOC conversations.   Clinical Assessment/Goals of Care:  *Please note that this is a verbal dictation therefore any spelling or grammatical errors are due to the Dragon Medical One system interpretation.  I have reviewed medical records including EPIC notes, labs and imaging, received report from bedside RN, assessed the patient who is lying in bed generally frail and ill in appearance.   I met with Darren [name], his spouse Darren [name], his sister, and his son to further discuss diagnosis prognosis, GOC, EOL wishes, disposition and options.   I introduced Palliative Medicine as specialized medical care for people living with serious illness. It focuses on providing relief from the symptoms and stress of a serious illness. The goal is to improve quality of life for both the patient and the family.  Medical History Review and Understanding:  A review of [name]'s past medical history significant for hypertension, CKD, atrial fibrillation, anemia, CHF, obstructive sleep apnea, COPD, and cirrhosis was completed.  Social History:  Darren [name] is from [place], [place] .  He and his wife Darren [name] have been married for the past 57 years.  They share 4 children and 13 grandchildren.   [name] was previously in the [employer].  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  He worked within the [employer] realm.  He is identified by his family to be extremely intelligent, and one of his great choices doing crossword puzzles.  He is a man of faith practicing within [facility] denomination.  Functional and Nutritional State:  Prior to hospitalization, Darren [name] has had a decline from a nutritional perspective and has lost a significant amount of weight over 40 pounds.  He has had a functional decline as well as muscular decline.  He is able to mobilize though slowly.  Advance Directives:  A detailed discussion was had today regarding advanced directives.  Patient does have advanced directives and his wife has brought a copy of these for review and to scanned into the electronic medical record.  Code Status:  Concepts specific to code status, artifical feeding and hydration, continued IV antibiotics and rehospitalization was had.  The difference between a aggressive medical intervention path  and a palliative comfort care path for this patient at this time was had.   Encouraged patient/family to consider DNR/DNI status understanding evidenced based poor outcomes in similar hospitalized patient, as the cause of arrest is likely associated with advanced chronic/terminal illness rather than an easily reversible acute cardio-pulmonary event. I explained that DNR/DNI does not change the medical plan and it only comes into effect after a person has arrested (died).  It is a protective measure to keep us  from harming the patient in their last moments of life.  [place] was agreeable to DNR/DNI with understanding  that patient would not receive CPR, defibrillation, ACLS medications, or intubation.   Discussion:  The circumstances surrounding Darren [name]'s admission to the hospital were  discussed and reviewed.  We discussed that as of last week Darren [name] started having increased difficulty in mobilizing as well as increased confusion.  This was abnormal prompting hospitalization.  Patient's wife shares that in 2023 Darren [name] was identified to have heart failure he also had A-fib requiring cardioversion.  He around that time also started CPAP and since then has not gone back into A-fib with uncontrolled ventricular rate.  Patient's wife shares about 15 years ago he started drinking whiskey more significantly after 5 PM at night causing worsening of his ascites.  She notes in May of this year his liver function was identified to be markedly declined and at that time regular paracentesis every month began.  She shares there is some concern that he has an infection from his last paracentesis.  A open and honest review of best case and worst-case scenarios in the setting of significant liver disease was held.  We reviewed reliance on the gastroenterology team to help guide though with regular paracentesis this indicates how poorly the liver is functioning himself.  We discussed continuing medications to see if it would improve quality of life for Darren [name].  An additional layer of complication is that Darren [name] since hospitalization has been started on dialysis for severe uremic encephalopathy, hyperkalemia, and acidosis.  We discussed that it is not certain if this will be a short-term or long-term measure at this time.  We reviewed if patient should continue to decline the options moving forward inclusive of comfort care and hospice care. I described hospice as a service for patients who have a life expectancy of 6 months or less. The goal of hospice is the preservation of dignity and quality at the end phases of life. Under hospice care, the focus changes from curative to symptom relief.   Patient's family asked about the various environments hospice could be provided in.  We discussed hospice in  the home, hospice in a skilled nursing facility, and hospice in a hospice facility.  Patient's family will remain reliant upon the assessment and recommendations of the gastroenterology team they were thankful for the information provided.  Discussed the importance of continued conversation with family and their  medical providers regarding overall plan of care and treatment options, ensuring decisions are within the context of the patients values and GOCs.  Decision Maker: Bazzi,Catherine (Spouse): [phone] ([place])   SUMMARY OF RECOMMENDATIONS   DNAR/DNI  Open and honest conversations held with Juanita and his family in the setting of his progressive liver disease  Waylyn is now on dialysis and is unclear whether or not this will be short-term or long-term  Plan to allow time for outcomes  Broached the topic of hospice should patient declined further  The palliative care team will remain involved and supportive during this difficult time  Code Status/Advance Care Planning: DNAR/DNI  Palliative Prophylaxis:  Aspiration, Bowel Regimen, Delirium Protocol, Frequent Pain Assessment, Oral Care, Palliative Wound Care, and Turn Reposition  Additional Recommendations (Limitations, Scope, Preferences): Continue current care  Psycho-social/Spiritual:  Desire for further Chaplaincy support: Yes Additional Recommendations: Education on chronic disease burden, chronic kidney disease, alcohol  induced cirrhosis   Prognosis: High chronic comorbid burden, decreased functionality, decreased nutrition, increased 52-month mortality risk.  Discharge Planning: Discharge plan to be determined.  Vitals:   03/23/24 0325 03/23/24 0400  BP: 106/63   Pulse: 93 79  Resp: 17 17  Temp: 98.7 F (37.1 C)   SpO2: 100% 99%    Intake/Output Summary (Last 24 hours) at 03/23/2024 0730 Last data filed at 03/23/2024 0300 Gross per 24 hour  Intake 536.02 ml  Output 630 ml  Net -93.98 ml   Last  Weight  Most recent update: 03/23/2024  4:38 AM    Weight  79.2 kg (174 lb 9.7 oz)             LABS: CBC:    Component Value Date/Time   WBC 12.6 (H) 03/23/2024 0517   HGB 7.8 (L) 03/23/2024 0517   HCT 24.3 (L) 03/23/2024 0517   PLT 468 (H) 03/23/2024 0517   MCV 87.1 03/23/2024 0517   NEUTROABS 17.5 (H) 03/22/2024 0852   LYMPHSABS 0.2 (L) 03/22/2024 0852   MONOABS 0.7 03/22/2024 0852   EOSABS 0.0 03/22/2024 0852   BASOSABS 0.0 03/22/2024 0852   Comprehensive Metabolic Panel:    Component Value Date/Time   NA 136 03/23/2024 0518   NA 140 06/29/2022 1039   K 4.6 03/23/2024 0518   CL 106 03/23/2024 0518   CO2 16 (L) 03/23/2024 0518   BUN 108 (H) 03/23/2024 0518   BUN 16 06/29/2022 1039   CREATININE 2.44 (H) 03/23/2024 0518   GLUCOSE 102 (H) 03/23/2024 0518   CALCIUM  8.4 (L) 03/23/2024 0518   AST 34 03/23/2024 0517   ALT 18 03/23/2024 0517   ALKPHOS 97 03/23/2024 0517   BILITOT 0.6 03/23/2024 0517   PROT 5.7 (L) 03/23/2024 0517   ALBUMIN  2.4 (L) 03/23/2024 0518   Gen: Elderly Caucasian male chronically ill in appearance HEENT: moist mucous membranes CV: Regular rate and rhythm PULM: Breathing is even and nonlabored on 2 L nasal cannula ABD: soft/nontender EXT: LE edema Neuro: Alert and oriented x3  PPS: 30%   This conversation/these recommendations were discussed with patient primary care team, Dr. Vernon  Total Time: 80 ______________________________________________________ Rosaline Becton [place] Palliative Medicine Team Team Cell Phone: [phone] Please utilize secure chat with additional questions, if there is no response within 30 minutes please call the above phone number  Palliative Medicine Team providers are available by phone from 7am to 7pm daily and can be reached through the team cell phone.  Should this patient require assistance outside of these hours, please call the patient's attending physician.

## 2024-03-23 NOTE — Progress Notes (Signed)
   03/22/24 2021  Assess: MEWS Score  Temp 97.6 F (36.4 C)  BP 100/61  MAP (mmHg) 73  Pulse Rate (!) 103  ECG Heart Rate (!) 102  Resp 16  Level of Consciousness Responds to Voice  SpO2 99 %  O2 Device Nasal Cannula  O2 Flow Rate (L/min) 2 L/min  Assess: MEWS Score  MEWS Temp 0  MEWS Systolic 1  MEWS Pulse 1  MEWS RR 0  MEWS LOC 1  MEWS Score 3  MEWS Score Color Yellow  Assess: if the MEWS score is Yellow or Red  Were vital signs accurate and taken at a resting state? Yes  Does the patient meet 2 or more of the SIRS criteria? No  MEWS guidelines implemented  Yes, yellow  Treat  MEWS Interventions Considered administering scheduled or prn medications/treatments as ordered  Take Vital Signs  Increase Vital Sign Frequency  Yellow: Q2hr x1, continue Q4hrs until patient remains green for 12hrs  Escalate  MEWS: Escalate Yellow: Discuss with charge nurse and consider notifying provider and/or RRT  Notify: Charge Nurse/RN  Name of Charge Nurse/RN Notified Heather, RN  Provider Notification  Provider Name/Title Dr. Lee  Date Provider Notified 03/22/24  Time Provider Notified 2105  Method of Notification  (Secure chat)  Notification Reason Other (Comment) (yellow MEWS)  Provider response No new orders  Assess: SIRS CRITERIA  SIRS Temperature  0  SIRS Respirations  0  SIRS Pulse 1  SIRS WBC 1  SIRS Score Sum  2

## 2024-03-23 NOTE — Progress Notes (Signed)
 PROGRESS NOTE    Darren [name]  FMW:[id number] DOB: 26-Jul-1940 DOA: 03/22/2024 PCP: [name], MD   Brief Narrative:  HPI: Darren [name] is a 83 y.o. male with medical history significant of hypertension, CKD, atrial fibrillation, anemia, diastolic CHF, OSA, obesity, COPD, cirrhosis with ascites presenting with worsening mental status.   History obtained with assistance of chart review and family.  Patient has had 5 days of worsening cognitive functioning and now has difficulty ambulating with significant confusion.  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   Has history of cirrhosis and has been getting paracentesis.  Last paracentesis was a week ago on 9/4 and had 5.3 L removed.  Did see a nephrologist yesterday per chart review and there was concern about his worsening renal function and he was started on increased dose of spironolactone  with increased frequency of Lasix .   Continue to have worsening status and so present to the ED today.   Unable to obtain full review of systems due to patient's encephalopathy.   ED Course: Vital signs in the ED notable for temperature 96, heart rate in the 80s-100s with some spurious low readings in the setting of A-fib, blood pressure in the 100s-110 systolic.   Lab workup included CMP with sodium 131, potassium 6.4, bicarb 11, BUN 162, creatinine elevated 3.34 from baseline of 1.4, glucose 149, albumin  1.7, alk phos 142.  CBC with leukocytosis to 18.6, hemoglobin near baseline 11, platelets 720.  PT and INR elevated at 27.7 and 2.4 respectively.  Lactic acid normal with repeat pending.  Lipase 86.  Respiratory panel for flu COVID RSV negative.  Urinalysis pending.  Ammonia level normal.  Blood culture pending.  Urine sodium pending.  Hepatitis B panel pending.  Body fluid cell count and culture pending.   Chest x-ray showed no acute abnormality.  Repeat chest x-ray showed patient status post right IJ CVC  placement.  CT head showed no acute abnormality.  Renal ultrasound is pending.   Patient received vancomycin , ceftriaxone , Flagyl  in the ED.  Also received midodrine , octreotide , albumin .  Received 1.5 L IV fluid.  For hyperkalemia received bicarb, Lokelma , calcium  gluconate, insulin  and dextrose .   Nephrology was consulted and plan to initiate dialysis today.  Critical care was consulted and evaluated patient.  They placed catheter for dialysis but feel patient is stable for stepdown unit at this time.  Reconsult as needed.  Assessment & Plan:   Principal Problem:   Acute renal failure superimposed on chronic kidney disease (HCC) Active Problems:   COPD  GOLD III   Morbid obesity due to excess calories [facility])   Essential hypertension   Obstructive sleep apnea   Paroxysmal atrial fibrillation (HCC)   Chronic diastolic CHF (congestive heart failure) (HCC)   Cirrhosis (HCC)   SIRS (systemic inflammatory response syndrome) (HCC)  Acute uremic encephalopathy/AKI on CKD stage IIIa/hyperkalemia/metabolic acidosis/hyponatremia, POA: Baseline creatinine around 1.3-1.4 in September 2024.  Presented with creatinine of 334 which peaked at 3.8 along with progressive decline in mental status for past 5 days now with decreased ability to ambulate and significant confusion.  Likely HRS. 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  Patient received TDC by critical care team, seen by nephrology, underwent urgent dialysis for severe uremic encephalopathy.  Hyponatremia and hyperkalemia both improved.  Creatinine improved as well.  Patient's encephalopathy is slightly improved as well.  Holding Lasix  and Aldactone .  Patient  with decompensated liver cirrhosis here with severe sepsis due to possible SBP, POA: SIRS Met severe sepsis criteria based on tachycardia, leukocytosis and encephalopathy as well as AKI on CKD.  Underwent paracentesis by critical care  team, fluid analysis indicative of SBP.  Patient has been started on Rocephin  2 g daily, will follow cultures.  He is afebrile and leukocytosis improving.  I think the likely source is SBP so I will discontinue vancomycin  and Flagyl  for now.  Per admitting hospitalist, GI is consulted, awaiting evaluation as well.  Continue midodrine , octreotide  and PPI.   Hypertension - Holding antihypertensives in the setting of low blood pressure and initiation of dialysis.   Atrial fibrillation - Holding metoprolol  with low blood pressure.  Will continue Eliquis  low-dose for now while monitoring hemoglobin closely.   Acute normocytic anemia, POA: Hemoglobin was around 11 about a year ago, presented with 11.9 yesterday and down to 7.8 today.  No reports of hematochezia, hematemesis or melena.  I wonder if he has anemia of chronic disease and yesterday's hemoglobin was actually hemoconcentrated and now this is his true hemoglobin.  Will repeat hemoglobin later today and transfuse if less than 7.   Diastolic CHF > Echo in 2022 with EF 50-55%, G2 DD, moderate pulmonary regurgitation. - Holding diuretics in the setting of dehydration/AKI/initiation of HD.   OSA - Continue home CPAP   COPD - Replaced home Dulera  with formulary Breo   DVT prophylaxis: apixaban  (ELIQUIS ) tablet 2.5 mg Start: 03/22/24 2200   Code Status: Full Code  Family Communication: Wife present at bedside.  Plan of care discussed with patient in length and he/she verbalized understanding and agreed with it.  Status is: Inpatient Remains inpatient appropriate because: Still encephalopathy.   Estimated body mass index is 24.35 kg/m as calculated from the following:   Height as of this encounter: 5' 11 (1.803 m).   Weight as of this encounter: 79.2 kg.    Nutritional Assessment: Body mass index is 24.35 kg/m.SABRA Seen by dietician.  I agree with the assessment and plan as outlined below: Nutrition Status:        . Skin  Assessment: I have examined the patient's skin and I agree with the wound assessment as performed by the wound care RN as outlined below:    Consultants:  GI, nephrology, critical care  Procedures:  As above  Antimicrobials:  Anti-infectives (From admission, onward)    Start     Dose/Rate Route Frequency Ordered Stop   03/22/24 2200  metroNIDAZOLE  (FLAGYL ) IVPB 500 mg        500 mg 100 mL/hr over 60 Minutes Intravenous Every 12 hours 03/22/24 1226     03/22/24 1259  vancomycin  variable dose per unstable renal function (pharmacist dosing)         Does not apply See admin instructions 03/22/24 1259     03/22/24 1130  cefTRIAXone  (ROCEPHIN ) 2 g in sodium chloride  0.9 % 100 mL IVPB        2 g 200 mL/hr over 30 Minutes Intravenous Every 24 hours 03/22/24 1120     03/22/24 1030  ceFEPIme  (MAXIPIME ) 2 g in sodium chloride  0.9 % 100 mL IVPB  Status:  Discontinued        2 g 200 mL/hr over 30 Minutes Intravenous  Once 03/22/24 1021 03/22/24 1120   03/22/24 1030  metroNIDAZOLE  (FLAGYL ) IVPB 500 mg        500 mg 100 mL/hr over 60 Minutes Intravenous  Once 03/22/24 1021  03/22/24 1421   03/22/24 1030  vancomycin  (VANCOREADY) IVPB 1750 mg/350 mL        1,750 mg 175 mL/hr over 120 Minutes Intravenous  Once 03/22/24 1022 03/22/24 2137         Subjective: Patient seen and examined, patient is alert, awake and oriented to self and place.  Wife at the bedside.  Patient has no complaints.  Could not tell me month or the year.  Objective: Vitals:   03/23/24 0030 03/23/24 0325 03/23/24 0400 03/23/24 0738  BP: (!) 89/53 106/63  99/60  Pulse: 96 93 79 87  Resp: 14 17 17 15   Temp: 98 F (36.7 C) 98.7 F (37.1 C)  98.7 F (37.1 C)  TempSrc: Axillary Axillary  Axillary  SpO2: 98% 100% 99% 98%  Weight:   79.2 kg   Height:        Intake/Output Summary (Last 24 hours) at 03/23/2024 0839 Last data filed at 03/23/2024 0300 Gross per 24 hour  Intake 536.02 ml  Output 630 ml  Net -93.98 ml    Filed Weights   03/22/24 2100 03/23/24 0400  Weight: 80.3 kg 79.2 kg    Examination:  General exam: Appears calm and comfortable but appears very deconditioned and sick.  Has bruise at the nasal bridge Respiratory system: Clear to auscultation. Respiratory effort normal. Cardiovascular system: S1 & S2 heard, RRR. No JVD, murmurs, rubs, gallops or clicks. No pedal edema. Gastrointestinal system: Abdomen is slightly distended with positive fluid shift and mild left lower quadrant tenderness. No organomegaly or masses felt. Normal bowel sounds heard. Central nervous system: Alert and oriented x 2. No focal neurological deficits. Extremities: Symmetric 5 x 5 power. Skin: No rashes, lesions or ulcers  Data Reviewed: I have personally reviewed following labs and imaging studies  CBC: Recent Labs  Lab 03/22/24 0852 03/22/24 0929 03/23/24 0517  WBC 18.6*  --  12.6*  NEUTROABS 17.5*  --   --   HGB 11.0* 11.9* 7.8*  HCT 35.8* 35.0* 24.3*  MCV 89.9  --  87.1  PLT 720*  --  468*   Basic Metabolic Panel: Recent Labs  Lab 03/22/24 0852 03/22/24 0929 03/23/24 0518  NA 131* 132* 136  K 6.4* 6.4* 4.6  CL 108 114* 106  CO2 11*  --  16*  GLUCOSE 149* 143* 102*  BUN 162* >130* 108*  CREATININE 3.34* 3.80* 2.44*  CALCIUM  9.2  --  8.4*  PHOS  --   --  5.0*   GFR: Estimated Creatinine Clearance: 25.3 mL/min (A) (by C-G formula based on SCr of 2.44 mg/dL (H)). Liver Function Tests: Recent Labs  Lab 03/22/24 0852 03/23/24 0517 03/23/24 0518  AST 32 34  --   ALT 24 18  --   ALKPHOS 142* 97  --   BILITOT 0.3 0.6  --   PROT 6.5 5.7*  --   ALBUMIN  1.7* 2.4* 2.4*   Recent Labs  Lab 03/22/24 0852  LIPASE 86*   Recent Labs  Lab 03/22/24 0852  AMMONIA 28   Coagulation Profile: Recent Labs  Lab 03/22/24 0852 03/23/24 0517  INR 2.4* 2.8*   Cardiac Enzymes: No results for input(s): CKTOTAL, CKMB, CKMBINDEX, TROPONINI in the last 168 hours. BNP (last 3  results) No results for input(s): PROBNP in the last 8760 hours. HbA1C: No results for input(s): HGBA1C in the last 72 hours. CBG: Recent Labs  Lab 03/22/24 1142 03/22/24 2024 03/23/24 0017 03/23/24 0333 03/23/24 0747  GLUCAP 186* 120*  105* 112* 93   Lipid Profile: No results for input(s): CHOL, HDL, LDLCALC, TRIG, CHOLHDL, LDLDIRECT in the last 72 hours. Thyroid Function Tests: No results for input(s): TSH, T4TOTAL, FREET4, T3FREE, THYROIDAB in the last 72 hours. Anemia Panel: No results for input(s): VITAMINB12, FOLATE, FERRITIN, TIBC, IRON , RETICCTPCT in the last 72 hours. Sepsis Labs: Recent Labs  Lab 03/22/24 0931  LATICACIDVEN 0.9    Recent Results (from the past 240 hours)  Resp panel by RT-PCR (RSV, Flu A&B, Covid) Anterior Nasal Swab     Status: None   Collection Time: 03/22/24  8:30 AM   Specimen: Anterior Nasal Swab  Result Value Ref Range Status   SARS Coronavirus 2 by RT PCR NEGATIVE NEGATIVE Final   Influenza A by PCR NEGATIVE NEGATIVE Final   Influenza B by PCR NEGATIVE NEGATIVE Final    Comment: (NOTE) The Xpert Xpress SARS-CoV-2/FLU/RSV plus assay is intended as an aid in the diagnosis of influenza from Nasopharyngeal swab specimens and should not be used as a sole basis for treatment. Nasal washings and aspirates are unacceptable for Xpert Xpress SARS-CoV-2/FLU/RSV testing.  Fact Sheet for Patients: BloggerCourse.com  Fact Sheet for Healthcare Providers: SeriousBroker.it  This test is not yet approved or cleared by the [place]  FDA and has been authorized for detection and/or diagnosis of SARS-CoV-2 by FDA under an Emergency Use Authorization (EUA). This EUA will remain in effect (meaning this test can be used) for the duration of the COVID-19 declaration under Section 564(b)(1) of the Act, 21 U.S.C. section 360bbb-3(b)(1), unless the authorization is  terminated or revoked.     Resp Syncytial Virus by PCR NEGATIVE NEGATIVE Final    Comment: (NOTE) Fact Sheet for Patients: BloggerCourse.com  Fact Sheet for Healthcare Providers: SeriousBroker.it  This test is not yet approved or cleared by the [place]  FDA and has been authorized for detection and/or diagnosis of SARS-CoV-2 by FDA under an Emergency Use Authorization (EUA). This EUA will remain in effect (meaning this test can be used) for the duration of the COVID-19 declaration under Section 564(b)(1) of the Act, 21 U.S.C. section 360bbb-3(b)(1), unless the authorization is terminated or revoked.  Performed at [facility], [address], [place], [place] [address]   Blood culture (routine x 2)     Status: None (Preliminary result)   Collection Time: 03/22/24  9:15 AM   Specimen: BLOOD  Result Value Ref Range Status   Specimen Description BLOOD RIGHT ANTECUBITAL  Final   Special Requests   Final    BOTTLES DRAWN AEROBIC AND ANAEROBIC Blood Culture adequate volume   Culture   Final    NO GROWTH < 24 HOURS Performed at [facility], [address], [place], [place] [address]    Report Status PENDING  Incomplete  Blood culture (routine x 2)     Status: None (Preliminary result)   Collection Time: 03/22/24  9:20 AM   Specimen: BLOOD  Result Value Ref Range Status   Specimen Description BLOOD LEFT ANTECUBITAL  Final   Special Requests   Final    BOTTLES DRAWN AEROBIC AND ANAEROBIC Blood Culture adequate volume   Culture   Final    NO GROWTH < 24 HOURS Performed at [facility], [address] [place], [place] [address]    Report Status PENDING  Incomplete  Body fluid culture w Gram Stain     Status: None (Preliminary result)   Collection Time: 03/22/24 12:25 PM   Specimen:  Peritoneal Washings; Peritoneal Fluid  Result Value Ref Range Status   Specimen Description PERITONEAL  Final   Special  Requests NONE  Final   Gram Stain   Final    RARE WBC SEEN NO ORGANISMS SEEN Performed at [facility], [address] [place], [place] [address]    Culture PENDING  Incomplete   Report Status PENDING  Incomplete  MRSA Next Gen by PCR, Nasal     Status: None   Collection Time: 03/22/24  7:21 PM   Specimen: Nasal Mucosa; Nasal Swab  Result Value Ref Range Status   MRSA by PCR Next Gen NOT DETECTED NOT DETECTED Final    Comment: (NOTE) The GeneXpert MRSA Assay (FDA approved for NASAL specimens only), is one component of a comprehensive MRSA colonization surveillance program. It is not intended to diagnose MRSA infection nor to guide or monitor treatment for MRSA infections. Test performance is not FDA approved in patients less than 54 years old. Performed at [facility], [address] [place], [place] [address]      Radiology Studies: US  RENAL Result Date: 03/22/2024 EXAM: US  Retroperitoneum Complete, Renal. CLINICAL HISTORY: 409830 AKI (acute kidney injury) (HCC) 409830. AKI (acute kidney injury) (HCC) TECHNIQUE: Real-time ultrasound of the retroperitoneum (complete) with image documentation. COMPARISON: None provided. FINDINGS: RIGHT KIDNEY: Measures 11.9 x 6.6 x 7.7 cm (319.4 cc). 7.2 cm upper pole right renal cyst. No hydronephrosis or mass visualized. LEFT KIDNEY: Not well visualized. BLADDER: Unremarkable as visualized. OTHER FINDINGS: Small - moderate ascites. IMPRESSION: 1. Right upper pole renal cyst measuring 7.2 cm. 2. Small to moderate ascites. Electronically signed by: Dayne Hassell MD 03/22/2024 03:35 PM EDT RP Workstation: [id number]   DG Chest Port 1 View Result Date: 03/22/2024 EXAM: 1 VIEW XRAY OF THE CHEST 03/22/2024 01:07:00 PM COMPARISON: 03/22/2024 CLINICAL HISTORY: Encounter for central line placement FINDINGS: LINES, TUBES AND DEVICES: Right IJ CVC in place with tip in mid SVC. LUNGS AND PLEURA: No focal pulmonary opacity. No pulmonary edema. No  pleural effusion. No pneumothorax. HEART AND MEDIASTINUM: No acute abnormality of the cardiac and mediastinal silhouettes. Aortic calcification. BONES AND SOFT TISSUES: No acute osseous abnormality. IMPRESSION: 1. Right IJ CVC in place with tip in mid SVC. No pneumothorax after catheter placement. Electronically signed by: Waddell Calk MD 03/22/2024 01:36 PM EDT RP Workstation: [id number]   CT Head Wo Contrast Result Date: 03/22/2024 CLINICAL DATA:  83 year old male with altered mental status, declining mentally over the past week. EXAM: CT HEAD WITHOUT CONTRAST TECHNIQUE: Contiguous axial images were obtained from the base of the skull through the vertex without intravenous contrast. RADIATION DOSE REDUCTION: This exam was performed according to the departmental dose-optimization program which includes automated exposure control, adjustment of the mA and/or kV according to patient size and/or use of iterative reconstruction technique. COMPARISON:  Paranasal sinus CT 11/29/2016. FINDINGS: Brain: Cerebral volume is within normal limits for age. No midline shift, ventriculomegaly, mass effect, evidence of mass lesion, intracranial hemorrhage or evidence of cortically based acute infarction. Gray-white differentiation within normal limits for age. Vascular: Calcified atherosclerosis at the skull base. No suspicious intracranial vascular hyperdensity. Skull: Intact, negative. Sinuses/Orbits: Visualized paranasal sinuses and mastoids are clear. Other: No acute orbit or scalp soft tissue finding. IMPRESSION: Normal for age noncontrast Head CT. Electronically Signed   By: VEAR Hurst M.D.   On: 03/22/2024 11:56   DG Chest Portable 1 View Result Date: 03/22/2024 EXAM: 1 VIEW XRAY OF THE CHEST 03/22/2024 09:29:00  AM COMPARISON: 06/15/2022 CLINICAL HISTORY: Infection evaluation - aspiration. Pt BIB [facility] from home d/t [facility]MS. Baseline A\T\O X4. Since Monday, pt started slowly declining mentally and physically. Pt is usually  able to walk around w a walker, but is no longer to do so. Hx liver failure and diabetes. BP 98/62; HR 80S; SPO2 95% RA; CBG 152. FINDINGS: LUNGS AND PLEURA: No focal pulmonary opacity. No pulmonary edema. No pleural effusion. No pneumothorax. HEART AND MEDIASTINUM: No acute abnormality of the cardiac and mediastinal silhouettes. Mildly elevated left hemidiaphragm. BONES AND SOFT TISSUES: No acute osseous abnormality. IMPRESSION: 1. No acute findings. 2. Mildly elevated left hemidiaphragm. Electronically signed by: Taylor Stroud MD 03/22/2024 09:55 AM EDT RP Workstation: [id number]    Scheduled Meds:  apixaban   2.5 mg Oral BID   atorvastatin   40 mg Oral Daily   Chlorhexidine  Gluconate Cloth  6 each Topical Q0600   colesevelam   1,875 mg Oral BID WC   fluticasone  furoate-vilanterol  1 puff Inhalation Daily   folic acid   1 mg Oral Daily   midodrine   10 mg Oral TID WC   octreotide   100 mcg Subcutaneous Q8H   mouth rinse  15 mL Mouth Rinse 4 times per day   pantoprazole   40 mg Oral BID AC   sodium chloride  flush  3 mL Intravenous Q12H   thiamine   100 mg Oral Daily   vancomycin  variable dose per unstable renal function (pharmacist dosing)   Does not apply See admin instructions   Continuous Infusions:  sodium chloride  10 mL/hr at 03/23/24 0300   cefTRIAXone  (ROCEPHIN )  IV Stopped (03/22/24 1313)   metronidazole  Stopped (03/22/24 2234)     LOS: 1 day   Fredia Skeeter, MD [facility]  03/23/2024, 8:39 AM   *Please note that this is a verbal dictation therefore any spelling or grammatical errors are due to the Dragon Medical One system interpretation.  Please page via Amion and do not message via secure chat for urgent patient care matters. Secure chat can be used for non urgent patient care matters.  How to contact the [facility] Attending or Consulting provider 7A - 7P or covering provider during after hours 7P -7A, for this patient?  Check the care team in [facility] and look for a)  attending/consulting [facility] provider listed and b) the [facility] team listed. Page or secure chat 7A-7P. Log into [email or web address] and use [employer]'s universal password to access. If you do not have the password, please contact the hospital operator. Locate the [facility] provider you are looking for under [facility] and page to a number that you can be directly reached. If you still have difficulty reaching the provider, please page the [facility] (Director on Call) for the Hospitalists listed on amion for assistance.

## 2024-03-24 DIAGNOSIS — N179 Acute kidney failure, unspecified: Secondary | ICD-10-CM | POA: Diagnosis not present

## 2024-03-24 DIAGNOSIS — N189 Chronic kidney disease, unspecified: Secondary | ICD-10-CM | POA: Diagnosis not present

## 2024-03-24 DIAGNOSIS — Z7189 Other specified counseling: Secondary | ICD-10-CM | POA: Diagnosis not present

## 2024-03-24 DIAGNOSIS — Z515 Encounter for palliative care: Secondary | ICD-10-CM | POA: Diagnosis not present

## 2024-03-24 LAB — RENAL FUNCTION PANEL
Albumin: 2.2 g/dL — ABNORMAL LOW (ref 3.5–5.0)
Anion gap: 14 (ref 5–15)
BUN: 63 mg/dL — ABNORMAL HIGH (ref 8–23)
CO2: 20 mmol/L — ABNORMAL LOW (ref 22–32)
Calcium: 8.3 mg/dL — ABNORMAL LOW (ref 8.9–10.3)
Chloride: 103 mmol/L (ref 98–111)
Creatinine, Ser: 1.94 mg/dL — ABNORMAL HIGH (ref 0.61–1.24)
GFR, Estimated: 34 mL/min — ABNORMAL LOW (ref >60)
Glucose, Bld: 98 mg/dL (ref 70–99)
Phosphorus: 3.3 mg/dL (ref 2.5–4.6)
Potassium: 3.9 mmol/L (ref 3.5–5.1)
Sodium: 137 mmol/L (ref 135–145)

## 2024-03-24 LAB — CBC
HCT: 29.3 % — ABNORMAL LOW (ref 39.0–52.0)
Hemoglobin: 9.2 g/dL — ABNORMAL LOW (ref 13.0–17.0)
MCH: 27.5 pg (ref 26.0–34.0)
MCHC: 31.4 g/dL (ref 30.0–36.0)
MCV: 87.5 fL (ref 80.0–100.0)
Platelets: 503 K/uL — ABNORMAL HIGH (ref 150–400)
RBC: 3.35 MIL/uL — ABNORMAL LOW (ref 4.22–5.81)
RDW: 17.5 % — ABNORMAL HIGH (ref 11.5–15.5)
WBC: 10.8 K/uL — ABNORMAL HIGH (ref 4.0–10.5)
nRBC: 0 % (ref 0.0–0.2)

## 2024-03-24 LAB — IRON AND TIBC
Iron: 12 ug/dL — ABNORMAL LOW (ref 45–182)
Saturation Ratios: 12 % — ABNORMAL LOW (ref 17.9–39.5)
TIBC: 104 ug/dL — ABNORMAL LOW (ref 250–450)
UIBC: 92 ug/dL

## 2024-03-24 LAB — GLUCOSE, CAPILLARY
Glucose-Capillary: 108 mg/dL — ABNORMAL HIGH (ref 70–99)
Glucose-Capillary: 145 mg/dL — ABNORMAL HIGH (ref 70–99)
Glucose-Capillary: 183 mg/dL — ABNORMAL HIGH (ref 70–99)
Glucose-Capillary: 196 mg/dL — ABNORMAL HIGH (ref 70–99)
Glucose-Capillary: 94 mg/dL (ref 70–99)
Glucose-Capillary: 95 mg/dL (ref 70–99)

## 2024-03-24 LAB — FERRITIN: Ferritin: 407 ng/mL — ABNORMAL HIGH (ref 24–336)

## 2024-03-24 LAB — HEPATITIS B SURFACE ANTIBODY, QUANTITATIVE: Hep B S AB Quant (Post): <3.5 m[IU]/mL — ABNORMAL LOW

## 2024-03-24 LAB — PROTIME-INR
INR: 2.8 — ABNORMAL HIGH (ref 0.8–1.2)
Prothrombin Time: 31 s — ABNORMAL HIGH (ref 11.4–15.2)

## 2024-03-24 MED ORDER — ENSURE PLUS HIGH PROTEIN PO LIQD
237.0000 mL | Freq: Two times a day (BID) | ORAL | Status: DC
  Administered 2024-03-24: 237 mL via ORAL

## 2024-03-24 MED ORDER — POLYSACCHARIDE IRON COMPLEX 150 MG PO CAPS
150.0000 mg | ORAL_CAPSULE | Freq: Every day | ORAL | Status: DC
  Administered 2024-03-24 – 2024-04-01 (×9): 150 mg via ORAL
  Filled 2024-03-24 (×10): qty 1

## 2024-03-24 MED ORDER — DEXTROSE 50 % IV SOLN: 1.0000 | INTRAVENOUS | Status: DC | PRN

## 2024-03-24 NOTE — Progress Notes (Signed)
 Darren [name]  Assessment/ Plan: Pt is a 83 y.o. 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    # Acute kidney injury on CKD IIIa with severe uremic encephalopathy, hyperkalemia and acidosis: Likely due to combination of reduced effective circulatory volume caused by recent paracentesis, use of spironolactone , diuretics and HRS. It seems like the dose of Aldactone  and diuretics increase recently to manage recurrent ascites.  Kidney US  with no hydro. -Started dialysis on 9/13 for severe uremic encephalopathy and multiple metabolic abnormalities.  Reportedly he was at his normal mental status until about a week ago.   -------------- - Hoping for some improvement of mental status with dialysis however he may not be a good candidate for long-term outpatient dialysis.  - Assess dialysis needs daily - may need HD again tomorrow but no acute need for HD today  - Agree with continuing midodrine  and octreotide .  No longer getting albumin  and would hold off - wet cough  - may need intermittent lasix  soon - Remain off of Aldactone   - Ordered strict ins/outs, daily weights    # Hyperkalemia due to AKI, Aldactone :  - Treating with dialysis.   # Severe metabolic acidosis with AKI:  - RRT as above.   # Presumed sepsis, unknown source:  - Blood cultures 9/13 NGTD  - receiving abx per primary team (on ceftriaxone )   # History of liver cirrhosis, ascites, hypoalbuminemia requiring frequent paracentesis.  Seen by GI on Xifaxan .  # Normocytic Anemia - some component of dilution after receiving fluid in ER.  He looked dry and dehydrated on presentation.  - iron  deficiency - start oral iron  and if infection ruled out would give  IV iron   - Transfuse as needed  Disposition - continue inpatient monitoring     Subjective:  He had uop over 9/14.  Last HD on 9/14 with no UF.  He feels ok.  I spoke with his wife at bedside as well as his granddaughter.  His wife states that his mental status is much improved from before.   Review of systems:  Reports some shortness of breath; no chest pain  No n/v; food doesn't really taste like anything     Objective Vital signs in last 24 hours: Vitals:   03/24/24 0900 03/24/24 1000 03/24/24 1100 03/24/24 1226  BP: 102/70 102/72 100/69 (!) 92/57  Pulse: 93 97    Resp: 18 20 16    Temp:    97.9 F (36.6 C)  TempSrc:    Oral  SpO2: 99% 99%    Weight:      Height:       Weight change: -1.1 kg  Intake/Output Summary (Last 24 hours) at 03/24/2024 1334 Last data filed at 03/23/2024 2000 Gross per 24 hour  Intake 100 ml  Output 450 ml  Net -350 ml       Labs: RENAL PANEL Recent Labs  Lab 03/22/24 0852 03/22/24 0929 03/23/24 0517 03/23/24 0518 03/24/24 0535  NA 131* 132*  --  136 137  K 6.4* 6.4*  --  4.6 3.9  CL 108 114*  --  106 103  CO2 11*  --   --  16* 20*  GLUCOSE 149* 143*  --  102* 98  BUN 162* >130*  --  108* 63*  CREATININE 3.34* 3.80*  --  2.44* 1.94*  CALCIUM  9.2  --   --  8.4* 8.3*  PHOS  --   --   --  5.0* 3.3  ALBUMIN  1.7*  --  2.4* 2.4* 2.2*    Liver Function Tests: Recent Labs  Lab 03/22/24 0852 03/23/24 0517 03/23/24 0518 03/24/24 0535  AST 32 34  --   --   ALT 24 18  --   --   ALKPHOS 142* 97  --   --   BILITOT 0.3 0.6  --   --   PROT 6.5 5.7*  --   --   ALBUMIN  1.7* 2.4* 2.4* 2.2*   Recent Labs  Lab 03/22/24 0852  LIPASE 86*   Recent Labs  Lab 03/22/24 0852  AMMONIA 28   CBC: Recent Labs    03/22/24 0929 03/23/24 0517 03/23/24 0825 03/23/24 1412 03/24/24 0535  HGB 11.9* 7.8* 7.5* 8.1* 9.2*  MCV  --  87.1  --  87.9 87.5  FERRITIN  --   --   --   --  407*  TIBC  --   --   --   --  104*  IRON   --   --    --   --  12*    Cardiac Enzymes: No results for input(s): CKTOTAL, CKMB, CKMBINDEX, TROPONINI in the last 168 hours. CBG: Recent Labs  Lab 03/23/24 1953 03/23/24 2350 03/24/24 0356 03/24/24 0753 03/24/24 1136  GLUCAP 87 108* 95 94 108*    Iron  Studies:  Recent Labs    03/24/24 0535  IRON  12*  TIBC 104*  FERRITIN 407*   Studies/Results: US  RENAL Result Date: 03/22/2024 EXAM: US  Retroperitoneum Complete, Renal. CLINICAL HISTORY: 409830 AKI (acute kidney injury) (HCC) 409830. AKI (acute kidney injury) (HCC) TECHNIQUE: Real-time ultrasound of the retroperitoneum (complete) with image documentation. COMPARISON: None provided. FINDINGS: RIGHT KIDNEY: Measures 11.9 x 6.6 x 7.7 cm (319.4 cc). 7.2 cm upper pole right renal cyst. No hydronephrosis or mass visualized. LEFT KIDNEY: Not well visualized. BLADDER: Unremarkable as visualized. OTHER FINDINGS: Small - moderate ascites. IMPRESSION: 1. Right upper pole renal cyst measuring 7.2 cm. 2. Small to moderate ascites. Electronically signed by: Dayne Hassell MD 03/22/2024 03:35 PM EDT RP Workstation: [id number]    Medications: Infusions:  cefTRIAXone  (ROCEPHIN )  IV 2 g (03/24/24 1134)    Scheduled Medications:  atorvastatin   40 mg Oral Daily   Chlorhexidine  Gluconate Cloth  6 each Topical Q0600   Chlorhexidine  Gluconate Cloth  6 each Topical Q0600   colesevelam   1,875 mg Oral BID WC   fluticasone  furoate-vilanterol  1 puff Inhalation Daily   folic acid   1 mg Oral Daily   midodrine   10 mg Oral TID WC   octreotide   100 mcg Subcutaneous Q8H   mouth rinse  15 mL Mouth Rinse 4 times per day   pantoprazole   40 mg Oral BID AC   rifaximin   550 mg Oral BID   sodium chloride  flush  3 mL Intravenous Q12H   thiamine   100 mg Oral Daily    have reviewed scheduled and prn medications.  Physical Exam:   General elderly male in bed in no acute distress, chronically ill-appearing  HEENT normocephalic atraumatic extraocular movements  intact sclera anicteric Neck supple trachea midline Lungs clear to auscultation bilaterally normal work of breathing at rest; occasional wet-sounding cough  Heart regular rate and rhythm no rubs or gallops appreciated Abdomen soft nontender nondistended Extremities no pittin  edema  Psych normal mood and affect Neuro alert and oriented to person, year, and [facility] GU condom catheter in place Access RIJ nontunneled dialysis catheter      Darren [name] 03/24/2024,2:08 PM  LOS: 2 days

## 2024-03-24 NOTE — Evaluation (Signed)
 Occupational Therapy Evaluation Patient Details Name: Darren [name] MRN: [id number] DOB: 1941/06/05 Today's Date: 03/24/2024   History of Present Illness   Pt is an 83 yo male who presented 03/22/24 due to AMS. Pt admitted due to severe AKI and sepsis. HD initiated 9/13 due to uremic encephalopathy and multiple metabolic abnormalities. PMH includes: HTN, CKD, afib, anemia, diastolic CHF, OSA, obesity, COPD, liver cirrhosis with ascites for which he gets frequent paracentesis (last on 9/4 with 5.3L removed).     Clinical Impressions Pt presented in bed and agreeable to session. At the start of session he reported he was in the hospital but in [place], [place]. Pt was oriented to what happened and where they are currently and to use aides on the wall to assist. Pt was able to complete supine to sitting with min assist, sit to stand transfers with min-mod assist and took lateral steps to [facility] with min-mod assist and RW. Pt did not report they were feeling dizzy but note to start not to talk and went into a more kyphotic posture with standing. Pt then required mod assist sitting to standing. At the end session pt was more alert and was able to report where he is but needed visual aides to recall date. Patient will benefit from continued inpatient follow up therapy, <3 hours/day  BP:  Supine: 100/89 (95) Sitting: 96/68 (77) Standing: 100/ 89 (95)       If plan is discharge home, recommend the following:   A lot of help with walking and/or transfers     Functional Status Assessment   Patient has had a recent decline in their functional status and demonstrates the ability to make significant improvements in function in a reasonable and predictable amount of time.     Equipment Recommendations   BSC/3in1     Recommendations for Other Services         Precautions/Restrictions   Precautions Precautions: Fall Recall of Precautions/Restrictions: Impaired Precaution/Restrictions  Comments: orthostatic Restrictions Weight Bearing Restrictions Per Provider Order: No     Mobility Bed Mobility Overal bed mobility: Needs Assistance Bed Mobility: Supine to Sit     Supine to sit: Min assist, Contact guard, HOB elevated, Used rails     General bed mobility comments: mod A and max cues on hand positioniong    Transfers Overall transfer level: Needs assistance Equipment used: Rolling walker (2 wheels) Transfers: Sit to/from Stand Sit to Stand: Min assist, Mod assist, From elevated surface           General transfer comment: lateral steps to [facility] with min-mod assit.Deffered OOB at this time as pt could not report on how they were feeling with steps      Balance Overall balance assessment: Needs assistance Sitting-balance support: Single extremity supported, Feet supported Sitting balance-Leahy Scale: Poor   Postural control: Posterior lean Standing balance support: Bilateral upper extremity supported Standing balance-Leahy Scale: Poor                             ADL either performed or assessed with clinical judgement   ADL Overall ADL's : Needs assistance/impaired Eating/Feeding: Set up;Sitting   Grooming: Minimal assistance;Sitting   Upper Body Bathing: Contact guard assist;Minimal assistance;Sitting   Lower Body Bathing: Maximal assistance;Sit to/from stand   Upper Body Dressing : Contact guard assist;Sitting   Lower Body Dressing: Maximal assistance;Sit to/from stand       Toileting- Architect and Hygiene: Total  assistance;Sit to/from stand       Functional mobility during ADLs: Minimal assistance;Rolling walker (2 wheels)       Vision Patient Visual Report:  (pt reports having eye sx and no use of glasses)       Perception         Praxis         Pertinent Vitals/Pain Pain Assessment Pain Assessment: No/denies pain Facial Expression: Relaxed, neutral Body Movements: Absence of movements Muscle  Tension: Relaxed Vocalization (extubated pts.): N/A     Extremity/Trunk Assessment Upper Extremity Assessment Upper Extremity Assessment: Generalized weakness;Difficult to assess due to impaired cognition   Lower Extremity Assessment Lower Extremity Assessment: Defer to PT evaluation   Cervical / Trunk Assessment Cervical / Trunk Assessment: Kyphotic   Communication Communication Communication: No apparent difficulties   Cognition Arousal: Lethargic Behavior During Therapy: Flat affect Cognition: No family/caregiver present to determine baseline             OT - Cognition Comments: Pt intially reported name and they were in the hospital but could not report which one even with cueing to look at the wall. He reported he is in [place] and was orienated to location and was able to recall at the end of session.                 Following commands: Impaired Following commands impaired: Only follows one step commands consistently, Follows one step commands with increased time     Cueing  General Comments   Cueing Techniques: Verbal cues  Pt on 2L and at 96-100%   Exercises     Shoulder Instructions      Home Living Family/patient expects to be discharged to:: Private residence Living Arrangements: Spouse/significant other Available Help at Discharge: Family;Available 24 hours/day Type of Home: House Home Access: Stairs to enter [employer] of Steps: 1   Home Layout: One level     Bathroom Shower/Tub: Producer, television/film/video: Standard     Home Equipment: Agricultural consultant (2 wheels);Shower seat - built in;Grab bars - tub/shower;Shower seat;Hand held shower head;Lift chair;Toilet riser (wife has rented a WC and a lift for WC)   Additional Comments: CPAP at night      Prior Functioning/Environment Prior Level of Function : Independent/Modified Independent             Mobility Comments: per wife, pt with decline in last week,  from walking to and from lift chair to bed/bathroom to needing family friends to come over to lift him into [facility] ADLs Comments: pt reports independent    OT Problem List: Decreased strength;Decreased range of motion;Decreased activity tolerance;Impaired balance (sitting and/or standing);Decreased cognition;Decreased safety awareness;Decreased knowledge of use of DME or AE;Cardiopulmonary status limiting activity   OT Treatment/Interventions: Self-care/ADL training;Therapeutic exercise;DME and/or AE instruction;Therapeutic activities;Patient/family education;Balance training      OT Goals(Current goals can be found in the care plan section)   Acute Rehab OT Goals Patient Stated Goal: none OT Goal Formulation: With patient Time For Goal Achievement: 04/07/24 Potential to Achieve Goals: Fair   OT Frequency:  Min 2X/week    Co-evaluation              AM-PAC OT 6 Clicks Daily Activity     Outcome Measure Help from another person eating meals?: None Help from another person taking care of personal grooming?: A Little Help from another person toileting, which includes using toliet, bedpan, or urinal?: A Lot Help from another  person bathing (including washing, rinsing, drying)?: A Lot Help from another person to put on and taking off regular upper body clothing?: A Little Help from another person to put on and taking off regular lower body clothing?: A Lot 6 Click Score: 16   End of Session Equipment Utilized During Treatment: Gait belt;Rolling walker (2 wheels) Nurse Communication: Mobility status  Activity Tolerance: Patient limited by lethargy Patient left: in bed;with call bell/phone within reach;with bed alarm set  OT Visit Diagnosis: Unsteadiness on feet (R26.81);Other abnormalities of gait and mobility (R26.89);Repeated falls (R29.6);Muscle weakness (generalized) (M62.81)                Time: 9262-9175 OT Time Calculation (min): 47 min Charges:  OT General Charges $OT  Visit: 1 Visit OT Evaluation $OT Eval Low Complexity: 1 Low OT Treatments $Self Care/Home Management : 23-37 mins  Warrick POUR OTR/L  Acute Rehab Services  [phone] office number   Warrick Berber 03/24/2024, 11:31 AM

## 2024-03-24 NOTE — Plan of Care (Signed)

## 2024-03-24 NOTE — Progress Notes (Signed)
 PROGRESS NOTE    Darren [name]  FMW:[id number] DOB: 02-Jan-1941 DOA: 03/22/2024 PCP: [name], MD   Brief Narrative:  Darren [name] is a 83 y.o. male with medical history significant of hypertension, CKD, atrial fibrillation, anemia, diastolic CHF, OSA, obesity, COPD, cirrhosis with ascites presented with worsening mental status for 5 days prior to coming followed by difficulty ambulating. 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 Has history of cirrhosis and has been getting paracentesis.  Last paracentesis was a week ago on 9/4 and had 5.3 L removed.  Did see a nephrologist a day prior, per chart review and there was concern about his worsening renal function and he was started on increased dose of spironolactone  with increased frequency of Lasix .  Presented to ED due to worsening symptoms.    Upon arrival to ED, hemodynamically stable. Lab workup included CMP with sodium 131, potassium 6.4, bicarb 11, BUN 162, creatinine elevated 3.34 from baseline of 1.4, glucose 149, albumin  1.7, alk phos 142.  CBC with leukocytosis to 18.6, hemoglobin near baseline 11, platelets 720.  PT and INR elevated at 27.7 and 2.4 respectively.  Lactic acid normal with repeat pending.  Lipase 86.  Respiratory panel for flu COVID RSV negative. Ammonia level normal.  Chest x-ray showed no acute abnormality. CT head showed no acute abnormality.  Patient received vancomycin , ceftriaxone , Flagyl  in the ED.  Also received midodrine , octreotide , albumin .  Received 1.5 L IV fluid.  For hyperkalemia received bicarb, Lokelma , calcium  gluconate, insulin  and dextrose . Critical care was consulted and evaluated patient, placed temporary dialysis catheter and did diagnostic paracentesis.  Patient was seen by nephrology, underwent hemodialysis same day.  Details below.  Assessment & Plan:   Principal Problem:   Acute renal failure superimposed on chronic kidney disease (HCC) Active  Problems:   COPD  GOLD III   Morbid obesity due to excess calories [facility])   Essential hypertension   Obstructive sleep apnea   Paroxysmal atrial fibrillation (HCC)   Chronic diastolic CHF (congestive heart failure) (HCC)   Cirrhosis (HCC)   SIRS (systemic inflammatory response syndrome) (HCC)  Acute uremic encephalopathy/AKI on CKD stage IIIa/hyperkalemia/metabolic acidosis/hyponatremia, POA: Baseline creatinine around 1.3-1.4 in September 2024.  Presented with creatinine of 334 which peaked at 3.8 along with progressive decline in mental status for past 5 days now with decreased ability to ambulate and significant confusion.  Likely HRS. Reportedly he is very cognitively sharp at baseline.  Patient received TDC by critical care team, seen by nephrology, underwent urgent dialysis for severe uremic encephalopathy.  Hyponatremia and hyperkalemia both improved.  Creatinine improved as well.  Patient's encephalopathy has resolved.  He is fully alert and oriented.  Lasix  and Aldactone  held.  Received another hemodialysis on 03/23/2024.  Patient with decompensated liver cirrhosis here with severe sepsis due to possible SBP, POA: SIRS Met severe sepsis criteria based on tachycardia, leukocytosis and encephalopathy as well as AKI on CKD.  Underwent paracentesis by critical care team, patient started on Flagyl , Rocephin  and vancomycin  fluid analysis indicative of SBP so Flagyl  and vancomycin  discontinued 03/23/2024.  Patient's INR 2.8 on 03/23/2024, GI recommended to hold Eliquis  and their note  (however this was not communicated to me directly and I found out their recommendations after reading their note on 03/24/2024), I have now discontinued Eliquis , repeating INR.  Continue midodrine , octreotide  and PPI.   Hypertension - Holding antihypertensives in the setting of low blood pressure and initiation of dialysis.  Atrial fibrillation - Holding metoprolol  with low blood pressure.  Rates controlled.   Discontinue Eliquis  as mentioned above.   Acute normocytic anemia, POA: Hemoglobin was around 11 about a year ago, presented with 11.9 yesterday and down to 7.5 on 03/23/2024 however it improved to 9.2 on 03/24/2024 without any transfusion..  No reports of hematochezia, hematemesis or melena.    Diastolic CHF > Echo in 2022 with EF 50-55%, G2 DD, moderate pulmonary regurgitation. - Holding diuretics in the setting of dehydration/AKI/initiation of HD.   OSA - Continue home CPAP   COPD - Replaced home Dulera  with formulary Breo   DVT prophylaxis: apixaban  (ELIQUIS ) tablet 2.5 mg Start: 03/22/24 2200   Code Status: Limited: Do not attempt resuscitation (DNR) -DNR-LIMITED -Do Not Intubate/DNI   Family Communication: Wife and multiple/3 other family members present at bedside.  Plan of care discussed with patient in length and he/she verbalized understanding and agreed with it.  Status is: Inpatient Remains inpatient appropriate because: Per family, he is going to have another dialysis today.   Estimated body mass index is 24.35 kg/m as calculated from the following:   Height as of this encounter: 5' 11 (1.803 m).   Weight as of this encounter: 79.2 kg.    Nutritional Assessment: Body mass index is 24.35 kg/m.SABRA Seen by dietician.  I agree with the assessment and plan as outlined below: Nutrition Status:        . Skin Assessment: I have examined the patient's skin and I agree with the wound assessment as performed by the wound care RN as outlined below:    Consultants:  GI, nephrology, critical care  Procedures:  As above  Antimicrobials:  Anti-infectives (From admission, onward)    Start     Dose/Rate Route Frequency Ordered Stop   03/23/24 1100  rifaximin  (XIFAXAN ) tablet 550 mg        550 mg Oral 2 times daily 03/23/24 1003     03/22/24 2200  metroNIDAZOLE  (FLAGYL ) IVPB 500 mg  Status:  Discontinued        500 mg 100 mL/hr over 60 Minutes Intravenous Every 12  hours 03/22/24 1226 03/23/24 0849   03/22/24 1259  vancomycin  variable dose per unstable renal function (pharmacist dosing)  Status:  Discontinued         Does not apply See admin instructions 03/22/24 1259 03/23/24 0849   03/22/24 1130  cefTRIAXone  (ROCEPHIN ) 2 g in sodium chloride  0.9 % 100 mL IVPB        2 g 200 mL/hr over 30 Minutes Intravenous Every 24 hours 03/22/24 1120     03/22/24 1030  ceFEPIme  (MAXIPIME ) 2 g in sodium chloride  0.9 % 100 mL IVPB  Status:  Discontinued        2 g 200 mL/hr over 30 Minutes Intravenous  Once 03/22/24 1021 03/22/24 1120   03/22/24 1030  metroNIDAZOLE  (FLAGYL ) IVPB 500 mg        500 mg 100 mL/hr over 60 Minutes Intravenous  Once 03/22/24 1021 03/22/24 1421   03/22/24 1030  vancomycin  (VANCOREADY) IVPB 1750 mg/350 mL        1,750 mg 175 mL/hr over 120 Minutes Intravenous  Once 03/22/24 1022 03/22/24 2137         Subjective: Patient seen and examined, he was sitting in the recliner, wife and 3 other family members were at the bedside.  Patient is fully alert and oriented, he has no complaint at all.  Wife is very happy with the  recovery.  Patient was also grateful for the care he has received.  Objective: Vitals:   03/23/24 2200 03/23/24 2330 03/24/24 0330 03/24/24 0751  BP: 110/72 106/79 117/68 98/71  Pulse: 99 (!) 36 85   Resp: 18 17 17    Temp:  97.6 F (36.4 C) 98.2 F (36.8 C) 97.8 F (36.6 C)  TempSrc:  Oral Axillary Axillary  SpO2: 100% 100% 100%   Weight:      Height:        Intake/Output Summary (Last 24 hours) at 03/24/2024 0851 Last data filed at 03/23/2024 2000 Gross per 24 hour  Intake 100 ml  Output 450 ml  Net -350 ml   Filed Weights   03/23/24 0400 03/23/24 1636 03/23/24 1922  Weight: 79.2 kg 79.2 kg 79.2 kg    Examination:  General exam: Appears calm and comfortable  Respiratory system: Clear to auscultation. Respiratory effort normal. Cardiovascular system: S1 & S2 heard, RRR. No JVD, murmurs, rubs, gallops or  clicks. No pedal edema. Gastrointestinal system: Abdomen is distended, soft with positive fluid shift but nontender today. No organomegaly or masses felt. Normal bowel sounds heard. Central nervous system: Alert and oriented. No focal neurological deficits. Extremities: Symmetric 5 x 5 power. Skin: No rashes, lesions or ulcers.  Psychiatry: Judgement and insight appear normal. Mood & affect appropriate.   Data Reviewed: I have personally reviewed following labs and imaging studies  CBC: Recent Labs  Lab 03/22/24 0852 03/22/24 0929 03/23/24 0517 03/23/24 0825 03/23/24 1412 03/24/24 0535  WBC 18.6*  --  12.6*  --  12.1* 10.8*  NEUTROABS 17.5*  --   --   --   --   --   HGB 11.0* 11.9* 7.8* 7.5* 8.1* 9.2*  HCT 35.8* 35.0* 24.3* 23.7* 25.4* 29.3*  MCV 89.9  --  87.1  --  87.9 87.5  PLT 720*  --  468*  --  473* 503*   Basic Metabolic Panel: Recent Labs  Lab 03/22/24 0852 03/22/24 0929 03/23/24 0518 03/24/24 0535  NA 131* 132* 136 137  K 6.4* 6.4* 4.6 3.9  CL 108 114* 106 103  CO2 11*  --  16* 20*  GLUCOSE 149* 143* 102* 98  BUN 162* >130* 108* 63*  CREATININE 3.34* 3.80* 2.44* 1.94*  CALCIUM  9.2  --  8.4* 8.3*  PHOS  --   --  5.0* 3.3   GFR: Estimated Creatinine Clearance: 31.8 mL/min (A) (by C-G formula based on SCr of 1.94 mg/dL (H)). Liver Function Tests: Recent Labs  Lab 03/22/24 0852 03/23/24 0517 03/23/24 0518 03/24/24 0535  AST 32 34  --   --   ALT 24 18  --   --   ALKPHOS 142* 97  --   --   BILITOT 0.3 0.6  --   --   PROT 6.5 5.7*  --   --   ALBUMIN  1.7* 2.4* 2.4* 2.2*   Recent Labs  Lab 03/22/24 0852  LIPASE 86*   Recent Labs  Lab 03/22/24 0852  AMMONIA 28   Coagulation Profile: Recent Labs  Lab 03/22/24 0852 03/23/24 0517  INR 2.4* 2.8*   Cardiac Enzymes: No results for input(s): CKTOTAL, CKMB, CKMBINDEX, TROPONINI in the last 168 hours. BNP (last 3 results) No results for input(s): PROBNP in the last 8760 hours. HbA1C: No  results for input(s): HGBA1C in the last 72 hours. CBG: Recent Labs  Lab 03/23/24 1220 03/23/24 1953 03/23/24 2350 03/24/24 0356 03/24/24 0753  GLUCAP 106* 87 108*  95 94   Lipid Profile: No results for input(s): CHOL, HDL, LDLCALC, TRIG, CHOLHDL, LDLDIRECT in the last 72 hours. Thyroid Function Tests: No results for input(s): TSH, T4TOTAL, FREET4, T3FREE, THYROIDAB in the last 72 hours. Anemia Panel: Recent Labs    03/24/24 0535  FERRITIN 407*  TIBC 104*  IRON  12*   Sepsis Labs: Recent Labs  Lab 03/22/24 0931  LATICACIDVEN 0.9    Recent Results (from the past 240 hours)  Resp panel by RT-PCR (RSV, Flu A&B, Covid) Anterior Nasal Swab     Status: None   Collection Time: 03/22/24  8:30 AM   Specimen: Anterior Nasal Swab  Result Value Ref Range Status   SARS Coronavirus 2 by RT PCR NEGATIVE NEGATIVE Final   Influenza A by PCR NEGATIVE NEGATIVE Final   Influenza B by PCR NEGATIVE NEGATIVE Final    Comment: (NOTE) The Xpert Xpress SARS-CoV-2/FLU/RSV plus assay is intended as an aid in the diagnosis of influenza from Nasopharyngeal swab specimens and should not be used as a sole basis for treatment. Nasal washings and aspirates are unacceptable for Xpert Xpress SARS-CoV-2/FLU/RSV testing.  Fact Sheet for Patients: BloggerCourse.com  Fact Sheet for Healthcare Providers: SeriousBroker.it  This test is not yet approved or cleared by the [place]  FDA and has been authorized for detection and/or diagnosis of SARS-CoV-2 by FDA under an Emergency Use Authorization (EUA). This EUA will remain in effect (meaning this test can be used) for the duration of the COVID-19 declaration under Section 564(b)(1) of the Act, 21 U.S.C. section 360bbb-3(b)(1), unless the authorization is terminated or revoked.     Resp Syncytial Virus by PCR NEGATIVE NEGATIVE Final    Comment: (NOTE) Fact Sheet for  Patients: BloggerCourse.com  Fact Sheet for Healthcare Providers: SeriousBroker.it  This test is not yet approved or cleared by the [place]  FDA and has been authorized for detection and/or diagnosis of SARS-CoV-2 by FDA under an Emergency Use Authorization (EUA). This EUA will remain in effect (meaning this test can be used) for the duration of the COVID-19 declaration under Section 564(b)(1) of the Act, 21 U.S.C. section 360bbb-3(b)(1), unless the authorization is terminated or revoked.  Performed at [facility], [address] [place], [place] [address]   Blood culture (routine x 2)     Status: None (Preliminary result)   Collection Time: 03/22/24  9:15 AM   Specimen: BLOOD  Result Value Ref Range Status   Specimen Description BLOOD RIGHT ANTECUBITAL  Final   Special Requests   Final    BOTTLES DRAWN AEROBIC AND ANAEROBIC Blood Culture adequate volume   Culture   Final    NO GROWTH < 24 HOURS Performed at [facility], [address], [place], [place] [address]    Report Status PENDING  Incomplete  Blood culture (routine x 2)     Status: None (Preliminary result)   Collection Time: 03/22/24  9:20 AM   Specimen: BLOOD  Result Value Ref Range Status   Specimen Description BLOOD LEFT ANTECUBITAL  Final   Special Requests   Final    BOTTLES DRAWN AEROBIC AND ANAEROBIC Blood Culture adequate volume   Culture   Final    NO GROWTH < 24 HOURS Performed at [facility], [address], [place], [place] [address]    Report Status PENDING  Incomplete  Body fluid culture w Gram Stain     Status: None (Preliminary result)   Collection Time: 03/22/24 12:25 PM  Specimen: Peritoneal Washings; Peritoneal Fluid  Result Value Ref Range Status   Specimen Description PERITONEAL  Final   Special Requests NONE  Final   Gram Stain RARE WBC SEEN NO ORGANISMS SEEN   Final   Culture   Final    NO GROWTH < 24  HOURS Performed at [facility], [address] [place], [place] [address]    Report Status PENDING  Incomplete  MRSA Next Gen by PCR, Nasal     Status: None   Collection Time: 03/22/24  7:21 PM   Specimen: Nasal Mucosa; Nasal Swab  Result Value Ref Range Status   MRSA by PCR Next Gen NOT DETECTED NOT DETECTED Final    Comment: (NOTE) The GeneXpert MRSA Assay (FDA approved for NASAL specimens only), is one component of a comprehensive MRSA colonization surveillance program. It is not intended to diagnose MRSA infection nor to guide or monitor treatment for MRSA infections. Test performance is not FDA approved in patients less than 92 years old. Performed at [facility], [address], [place], [place] [address]      Radiology Studies: US  RENAL Result Date: 03/22/2024 EXAM: US  Retroperitoneum Complete, Renal. CLINICAL HISTORY: 409830 AKI (acute kidney injury) (HCC) 409830. AKI (acute kidney injury) (HCC) TECHNIQUE: Real-time ultrasound of the retroperitoneum (complete) with image documentation. COMPARISON: None provided. FINDINGS: RIGHT KIDNEY: Measures 11.9 x 6.6 x 7.7 cm (319.4 cc). 7.2 cm upper pole right renal cyst. No hydronephrosis or mass visualized. LEFT KIDNEY: Not well visualized. BLADDER: Unremarkable as visualized. OTHER FINDINGS: Small - moderate ascites. IMPRESSION: 1. Right upper pole renal cyst measuring 7.2 cm. 2. Small to moderate ascites. Electronically signed by: Dayne Hassell MD 03/22/2024 03:35 PM EDT RP Workstation: [id number]   DG Chest Port 1 View Result Date: 03/22/2024 EXAM: 1 VIEW XRAY OF THE CHEST 03/22/2024 01:07:00 PM COMPARISON: 03/22/2024 CLINICAL HISTORY: Encounter for central line placement FINDINGS: LINES, TUBES AND DEVICES: Right IJ CVC in place with tip in mid SVC. LUNGS AND PLEURA: No focal pulmonary opacity. No pulmonary edema. No pleural effusion. No pneumothorax. HEART AND MEDIASTINUM: No acute abnormality of the cardiac and mediastinal  silhouettes. Aortic calcification. BONES AND SOFT TISSUES: No acute osseous abnormality. IMPRESSION: 1. Right IJ CVC in place with tip in mid SVC. No pneumothorax after catheter placement. Electronically signed by: Waddell Calk MD 03/22/2024 01:36 PM EDT RP Workstation: [id number]   CT Head Wo Contrast Result Date: 03/22/2024 CLINICAL DATA:  83 year old male with altered mental status, declining mentally over the past week. EXAM: CT HEAD WITHOUT CONTRAST TECHNIQUE: Contiguous axial images were obtained from the base of the skull through the vertex without intravenous contrast. RADIATION DOSE REDUCTION: This exam was performed according to the departmental dose-optimization program which includes automated exposure control, adjustment of the mA and/or kV according to patient size and/or use of iterative reconstruction technique. COMPARISON:  Paranasal sinus CT 11/29/2016. FINDINGS: Brain: Cerebral volume is within normal limits for age. No midline shift, ventriculomegaly, mass effect, evidence of mass lesion, intracranial hemorrhage or evidence of cortically based acute infarction. Gray-white differentiation within normal limits for age. Vascular: Calcified atherosclerosis at the skull base. No suspicious intracranial vascular hyperdensity. Skull: Intact, negative. Sinuses/Orbits: Visualized paranasal sinuses and mastoids are clear. Other: No acute orbit or scalp soft tissue finding. IMPRESSION: Normal for age noncontrast Head CT. Electronically Signed   By: VEAR Hurst M.D.   On: 03/22/2024 11:56   DG Chest Portable 1 View Result Date: 03/22/2024 EXAM: 1 VIEW  XRAY OF THE CHEST 03/22/2024 09:29:00 AM COMPARISON: 06/15/2022 CLINICAL HISTORY: Infection evaluation - aspiration. Pt BIB [facility] from home d/t AMS. Baseline A\T\O X4. Since Monday, pt started slowly declining mentally and physically. Pt is usually able to walk around w a walker, but is no longer to do so. Hx liver failure and diabetes. BP 98/62; HR 80S; SPO2  95% RA; CBG 152. FINDINGS: LUNGS AND PLEURA: No focal pulmonary opacity. No pulmonary edema. No pleural effusion. No pneumothorax. HEART AND MEDIASTINUM: No acute abnormality of the cardiac and mediastinal silhouettes. Mildly elevated left hemidiaphragm. BONES AND SOFT TISSUES: No acute osseous abnormality. IMPRESSION: 1. No acute findings. 2. Mildly elevated left hemidiaphragm. Electronically signed by: Taylor Stroud MD 03/22/2024 09:55 AM EDT RP Workstation: [id number]    Scheduled Meds:  apixaban   2.5 mg Oral BID   atorvastatin   40 mg Oral Daily   Chlorhexidine  Gluconate Cloth  6 each Topical Q0600   Chlorhexidine  Gluconate Cloth  6 each Topical Q0600   colesevelam   1,875 mg Oral BID WC   fluticasone  furoate-vilanterol  1 puff Inhalation Daily   folic acid   1 mg Oral Daily   midodrine   10 mg Oral TID WC   octreotide   100 mcg Subcutaneous Q8H   mouth rinse  15 mL Mouth Rinse 4 times per day   pantoprazole   40 mg Oral BID AC   rifaximin   550 mg Oral BID   sodium chloride  flush  3 mL Intravenous Q12H   thiamine   100 mg Oral Daily   Continuous Infusions:  cefTRIAXone  (ROCEPHIN )  IV Stopped (03/23/24 1048)     LOS: 2 days   Fredia Skeeter, MD [facility]  03/24/2024, 8:51 AM   *Please note that this is a verbal dictation therefore any spelling or grammatical errors are due to the Dragon Medical One system interpretation.  Please page via Amion and do not message via secure chat for urgent patient care matters. Secure chat can be used for non urgent patient care matters.  How to contact the [facility] Attending or Consulting provider 7A - 7P or covering provider during after hours 7P -7A, for this patient?  Check the care team in [facility] and look for a) attending/consulting [facility] provider listed and b) the [facility] team listed. Page or secure chat 7A-7P. Log into [email or web address] and use [facility]'s universal password to access. If you do not have the password, please contact the hospital  operator. Locate the [facility] provider you are looking for under [facility] and page to a number that you can be directly reached. If you still have difficulty reaching the provider, please page the [facility] (Director on Call) for the Hospitalists listed on amion for assistance.

## 2024-03-24 NOTE — Evaluation (Signed)
 Physical Therapy Evaluation Patient Details Name: Darren [name] MRN: [id number] DOB: Nov 24, 1940 Today's Date: 03/24/2024  History of Present Illness  Pt is an 83 yo male who presented 03/22/24 due to AMS. Pt admitted due to severe AKI and sepsis. HD initiated 9/13 due to uremic encephalopathy and multiple metabolic abnormalities. PMH includes: HTN, CKD, afib, anemia, diastolic CHF, OSA, obesity, COPD, liver cirrhosis with ascites for which he gets frequent paracentesis (last on 9/4 with 5.3L removed).   Clinical Impression  Pt in bed upon arrival of PT, agreeable to evaluation at this time. Pt with recent decline from no use of DME to using RW in the home to walk short distances, to needing assist from family friends to lift him out of WC to mobilize in the home. The pt was able to mobilize with largely modA to complete bed mobility, sit-stand and stand-pivot transfers this morning, and remains dependent on support to maintain static sitting as well as balance with stance. Pt with increased A-P sway and needing assist to manage RW movement with stepping. Further mobility also limited by orthostatic BP with change in position (see values below). Will benefit from continued skilled PT to progress functional strength, power, stability, and activity tolerance to decrease assist needed for transfers and household ambulation. Therefore, pt will benefit from continued inpatient therapies <3hours/day at this time until strength and capacity for transfers improves. Family present and agreeable with plan.   VITALS:  - supine in bed - BP: 100/75 (85); HR: 90bpm - sitting EOB - BP: 96/60 (69); HR: 99bpm - sitting EOB 5 min - BP: 81/59 (66); HR: 100bpm - standing - unable to obtain - sitting in recliner after transfer - BP: 91/68 (76); HR: 108bpm - sitting in recliner legs elevated - 102/72 (81); HR: 96bpm      If plan is discharge home, recommend the following: Two people to help with walking and/or  transfers;A lot of help with bathing/dressing/bathroom;Assistance with cooking/housework;Direct supervision/assist for medications management;Direct supervision/assist for financial management;Assist for transportation;Help with stairs or ramp for entrance;Supervision due to cognitive status   Can travel by private vehicle   No    Equipment Recommendations BSC/3in1;Wheelchair (measurements PT);Wheelchair cushion (measurements PT)  Recommendations for Other Services       Functional Status Assessment Patient has had a recent decline in their functional status and demonstrates the ability to make significant improvements in function in a reasonable and predictable amount of time.     Precautions / Restrictions Precautions Precautions: Fall Recall of Precautions/Restrictions: Impaired Precaution/Restrictions Comments: orthostatic Restrictions Weight Bearing Restrictions Per Provider Order: No      Mobility  Bed Mobility Overal bed mobility: Needs Assistance Bed Mobility: Supine to Sit     Supine to sit: Mod assist, HOB elevated, Used rails     General bed mobility comments: modA to pull on therapist and rail to come to sitting EOB. cues for movement of each extremity. increased time. drop in BP    Transfers Overall transfer level: Needs assistance Equipment used: Rolling walker (2 wheels) Transfers: Sit to/from Stand, Bed to chair/wheelchair/BSC Sit to Stand: Mod assist, From elevated surface   Step pivot transfers: Mod assist       General transfer comment: modA to power up then modA to manage balance and RW movement with pivotal steps. increased A-P sway in standing but no buckling or overt LOB    Ambulation/Gait Ambulation/Gait assistance: Mod assist Gait Distance (Feet): 3 Feet Assistive device: Rolling walker (2 wheels) Gait  Pattern/deviations: Step-to pattern Gait velocity: decreased Gait velocity interpretation: <1.31 ft/sec, indicative of household  ambulator   General Gait Details: small lateral steps from EOB to recliner with modA to steady due to A-P sway and assist to manage RW movement.     Balance Overall balance assessment: Needs assistance Sitting-balance support: Single extremity supported, Feet supported Sitting balance-Leahy Scale: Poor Sitting balance - Comments: minA to maintain static sitting due to posterior lean Postural control: Posterior lean Standing balance support: During functional activity, Bilateral upper extremity supported Standing balance-Leahy Scale: Poor Standing balance comment: BUE support and modA to maintain static stance, modA with gait also                             Pertinent Vitals/Pain Pain Assessment Pain Assessment: No/denies pain    Home Living Family/patient expects to be discharged to:: Private residence Living Arrangements: Spouse/significant other Available Help at Discharge: Family;Available 24 hours/day Type of Home: House Home Access: Stairs to enter   [employer] of Steps: 1   Home Layout: One level Home Equipment: Agricultural consultant (2 wheels);Shower seat - built in;Grab bars - tub/shower;Shower seat;Hand held shower head;Lift chair;Toilet riser (wife has arranged WC rental and lift component for the seat of the WC) Additional Comments: CPAP at night    Prior Function Prior Level of Function : Independent/Modified Independent             Mobility Comments: per wife, pt with decline in last week, from walking to and from lift chair to bed/bathroom to needing family friends to come over to lift him into [facility] ADLs Comments: pt reports independent     Extremity/Trunk Assessment   Upper Extremity Assessment Upper Extremity Assessment: Defer to OT evaluation    Lower Extremity Assessment Lower Extremity Assessment: Generalized weakness (hx of peripheral neuropathy)    Cervical / Trunk Assessment Cervical / Trunk Assessment: Kyphotic   Communication   Communication Communication: No apparent difficulties    Cognition Arousal: Lethargic Behavior During Therapy: Flat affect   PT - Cognitive impairments: Initiation, Problem solving, Sequencing, Safety/Judgement                       PT - Cognition Comments: pt able to retain orientation information from OT earlier in day, needed increased time and cues for all mobility Following commands: Impaired Following commands impaired: Only follows one step commands consistently, Follows one step commands with increased time     Cueing Cueing Techniques: Verbal cues     General Comments General comments (skin integrity, edema, etc.): SpO2 stable on RA, BP from 100/75 (85) in supine to 81/59 (66) in sitting, unable to get one in standing but BP recovered well once sitting in recliner with LE elevated        Assessment/Plan    PT Assessment Patient needs continued PT services  PT Problem List Decreased strength;Decreased activity tolerance;Decreased balance;Decreased mobility;Decreased cognition;Decreased safety awareness;Decreased knowledge of use of DME       PT Treatment Interventions DME instruction;Gait training;Stair training;Functional mobility training;Therapeutic activities;Therapeutic exercise;Balance training;Patient/family education    PT Goals (Current goals can be found in the Care Plan section)  Acute Rehab PT Goals Patient Stated Goal: to be able to walk household distances and go home PT Goal Formulation: With patient/family Time For Goal Achievement: 04/07/24 Potential to Achieve Goals: Good    Frequency Min 2X/week        AM-PAC  PT 6 Clicks Mobility  Outcome Measure Help needed turning from your back to your side while in a flat bed without using bedrails?: A Lot Help needed moving from lying on your back to sitting on the side of a flat bed without using bedrails?: A Lot Help needed moving to and from a bed to a chair (including a  wheelchair)?: A Lot Help needed standing up from a chair using your arms (e.g., wheelchair or bedside chair)?: A Lot Help needed to walk in hospital room?: Total (<20 ft) Help needed climbing 3-5 steps with a railing? : Total 6 Click Score: 10    End of Session Equipment Utilized During Treatment: Gait belt Activity Tolerance: Patient limited by fatigue Patient left: in chair;with call bell/phone within reach;with chair alarm set;with family/visitor present Nurse Communication: Mobility status;Other (comment) (orthostatic) PT Visit Diagnosis: Unsteadiness on feet (R26.81);Other abnormalities of gait and mobility (R26.89);Muscle weakness (generalized) (M62.81)    Time: 0930-1010 PT Time Calculation (min) (ACUTE ONLY): 40 min   Charges:   PT Evaluation $PT Eval Moderate Complexity: 1 Mod PT Treatments $Therapeutic Exercise: 8-22 mins $Therapeutic Activity: 8-22 mins PT General Charges $$ ACUTE PT VISIT: 1 Visit         Izetta Call, PT, DPT   Acute Rehabilitation Department Office [phone] Secure Chat Communication Preferred  Izetta JULIANNA Call 03/24/2024, 10:29 AM

## 2024-03-24 NOTE — Progress Notes (Signed)
 Darren [name] 10:18 AM  Subjective: Patient doing much better and patient's hospital computer chart reviewed and his case discussed with my partner Dr. Burnette as well as multiple family members including his wife and he is currently not eating but does not have a known swallowing issue and is doing better medically and has no abdominal pain and no new complaints and is working with physical therapy  Objective: Vital signs stable afebrile no acute distress obvious ascites soft nontender BUN and creatinine improved anemia of chronic disease hemoglobin stable white count okay platelet count okay INR increased  Assessment: Acute renal failure in patient with cirrhosis and improved altered mental status but also with ascites and SBP  Plan: Okay with me to try to eat or at least have a swallowing eval and might need vitamin K or FFP and might recommend repeat paracentesis for cell count and albumin  and cytology on the 18th after 5 days of antibiotics just to evaluate therapy and will check on tomorrow  [facility]  office [phone] After 5PM or if no answer call [phone]

## 2024-03-24 NOTE — NC FL2 (Signed)
 [place]  MEDICAID FL2 LEVEL OF CARE FORM     IDENTIFICATION  Patient Name: Darren [name] Birthdate: 01-May-1941 Sex: male Admission Date (Current Location): 03/22/2024  [facility] and [place] Number:  Producer, television/film/video and Address:  The . [facility], [address] [place], [place] [address]      Provider Number: [id number]  Attending Physician Name and Address:  Vernon Ranks, MD  Relative Name and Phone Number:  [name] (spouse) [phone]    Current Level of Care: Hospital Recommended Level of Care: Skilled Nursing Facility Prior Approval Number:    Date Approved/Denied:   PASRR Number: [id number]  Discharge Plan: SNF    Current Diagnoses: Patient Active Problem List   Diagnosis Date Noted   Iron  deficiency anemia due to chronic blood loss 03/22/2024   Cirrhosis (HCC) 03/22/2024   Acute renal failure superimposed on chronic kidney disease (HCC) 03/22/2024   SIRS (systemic inflammatory response syndrome) (HCC) 03/22/2024   Chronic anticoagulation 01/23/2023   ETOH abuse 06/16/2022   Chronic diastolic CHF (congestive heart failure) (HCC) 06/07/2022   Obstructive sleep apnea 09/11/2018   Shortness of breath 09/11/2018   bilateral leg edema 09/11/2018   Paroxysmal atrial fibrillation (HCC) 09/11/2018   Laboratory examination 09/11/2018   Essential hypertension 07/25/2018   Rhinitis, chronic 01/21/2018   GI bleed 03/07/2017   Cough variant asthma with ? component of UACS 03/30/2016   COPD  GOLD III 03/30/2016   Morbid obesity due to excess calories (HCC) 03/30/2016    Orientation RESPIRATION BLADDER Height & Weight     Self, Situation, Place  Normal Continent Weight: 174 lb 9.7 oz (79.2 kg) Height:  5' 11 (180.3 cm)  BEHAVIORAL SYMPTOMS/MOOD NEUROLOGICAL BOWEL NUTRITION STATUS      Incontinent Diet (Please see discharge summary)  AMBULATORY STATUS COMMUNICATION OF NEEDS Skin   Extensive Assist Verbally Other (Comment)  (Wound/Incision LDAs,Wound PI sacrum medial stage 1,wound,arm,distal,L,Lower,post.,Wound,other,nose)                       Personal Care Assistance Level of Assistance  Bathing, Feeding, Dressing Bathing Assistance: Maximum assistance Feeding assistance: Independent Dressing Assistance: Maximum assistance     Functional Limitations Info  Hearing, Speech   Hearing Info: Adequate Speech Info: Adequate    SPECIAL CARE FACTORS FREQUENCY  PT (By licensed PT), OT (By licensed OT)     PT Frequency: 5x min weekly OT Frequency: 5x min weekly            Contractures Contractures Info: Not present    Additional Factors Info  Code Status, Allergies, Isolation Precautions Code Status Info: DNR Allergies Info: Amlodipine ,Penicillins     Isolation Precautions Info: UV disinfection     Current Medications (03/24/2024):  This is the current hospital active medication list Current Facility-Administered Medications  Medication Dose Route Frequency Provider Last Rate Last Admin   acetaminophen  (TYLENOL ) tablet 650 mg  650 mg Oral Q6H PRN Seena Marsa NOVAK, MD   650 mg at 03/24/24 1202   Or   acetaminophen  (TYLENOL ) suppository 650 mg  650 mg Rectal Q6H PRN Seena Marsa NOVAK, MD       artificial tears ophthalmic solution   Both Eyes TID PRN Melvin, Alexander B, MD       atorvastatin  (LIPITOR) tablet 40 mg  40 mg Oral Daily Melvin, Alexander B, MD   40 mg at 03/24/24 1129   cefTRIAXone  (ROCEPHIN ) 2 g in sodium chloride  0.9 % 100 mL IVPB  2 g Intravenous Q24H Claudene Toribio BROCKS, MD 200 mL/hr at 03/24/24 1134 2 g at 03/24/24 1134   Chlorhexidine  Gluconate Cloth 2 % PADS 6 each  6 each Topical Q0600 Dolan Mateo Larger, MD   6 each at 03/24/24 0550   Chlorhexidine  Gluconate Cloth 2 % PADS 6 each  6 each Topical Q0600 Dolan Mateo Larger, MD   6 each at 03/24/24 0550   colesevelam  (WELCHOL ) tablet 1,875 mg  1,875 mg Oral BID WC Melvin, Alexander B, MD   1,875 mg at 03/23/24 2141    feeding supplement (ENSURE PLUS HIGH PROTEIN) liquid 237 mL  237 mL Oral BID BM Pahwani, Fredia, MD       fluticasone  furoate-vilanterol (BREO ELLIPTA ) 100-25 MCG/ACT 1 puff  1 puff Inhalation Daily Melvin, Alexander B, MD   1 puff at 03/24/24 9147   folic acid  (FOLVITE ) tablet 1 mg  1 mg Oral Daily Melvin, Alexander B, MD   1 mg at 03/24/24 1129   Influenza vac split trivalent PF (FLUZONE HIGH-DOSE) injection 0.5 mL  0.5 mL Intramuscular Prior to discharge Seena Marsa NOVAK, MD       iron  polysaccharides (NIFEREX) capsule 150 mg  150 mg Oral Daily Foster, Lori C, MD       midodrine  (PROAMATINE ) tablet 10 mg  10 mg Oral TID WC Claudene Toribio BROCKS, MD   10 mg at 03/24/24 1443   octreotide  (SANDOSTATIN ) injection 100 mcg  100 mcg Subcutaneous Q8H Claudene Toribio BROCKS, MD   100 mcg at 03/24/24 0550   Oral care mouth rinse  15 mL Mouth Rinse 4 times per day Seena Marsa NOVAK, MD   15 mL at 03/24/24 1200   Oral care mouth rinse  15 mL Mouth Rinse PRN Melvin, Alexander B, MD       pantoprazole  (PROTONIX ) EC tablet 40 mg  40 mg Oral BID AC Melvin, Alexander B, MD   40 mg at 03/24/24 1129   polyethylene glycol (MIRALAX  / GLYCOLAX ) packet 17 g  17 g Oral Daily PRN Melvin, Alexander B, MD       rifaximin  (XIFAXAN ) tablet 550 mg  550 mg Oral BID Burnette Fallow, MD   550 mg at 03/24/24 1129   sodium chloride  flush (NS) 0.9 % injection 3 mL  3 mL Intravenous Q12H Seena Marsa NOVAK, MD   3 mL at 03/24/24 1130   thiamine  (VITAMIN B1) tablet 100 mg  100 mg Oral Daily Melvin, Alexander B, MD   100 mg at 03/24/24 1129     Discharge Medications: Please see discharge summary for a list of discharge medications.  Relevant Imaging Results:  Relevant Lab Results:   Additional Information [id number]  Isaiah Public, LCSWA

## 2024-03-24 NOTE — Progress Notes (Signed)
   Palliative Medicine Inpatient Follow Up Note HPI: Darren [name] is a 84 year old male with medical history significant for cirrhosis with abdominal ascites (last paracentesis 9/4, 5.3L removed), atrial fibrillation on Eliquis , CHF, essential hypertension, GERD, and OSA who presented to the ED at [facility] 9/13 with AMS. Edge is presently being treated for hepatorenal syndrome. He is also noted to have significant cirrhosis with an increased MELD. Palliative care has been asked to support GOC conversations.   Today's Discussion 03/24/2024  *Please note that this is a verbal dictation therefore any spelling or grammatical errors are due to the Dragon Medical One system interpretation.  Admitted for encephalopathy. Has been started on dialysis.  Chart reviewed inclusive of vital signs, progress notes, laboratory results, and diagnostic images.   I met with Darren [name] this afternoon. He is awake and alert. He is in the presence of his wife, Darren [name]. He shares that he is feeling much improved. Darren [name] notes that this is the most awake and alert he has been. We discussed that if hemodialysis is needed moving into the future it would be something he would desire. He and I reviewed that this would be to the determination of the nephrology team as he may not be a good long term iHD candidate. He and his wife expressed understanding.   Created space and opportunity for patient to explore thoughts feelings and fears regarding current medical situation. He remains hopeful for ongoing improvements.  Darren [name] is willing to go to skilled nursing to gain strength. He has shared the desire to be able to provide self care in the future.   Darren [name] agrees to OP Palliative support on discharge.   Questions and concerns addressed/Palliative Support Provided.   Objective Assessment: Vital Signs Vitals:   03/24/24 1226 03/24/24 1633  BP: (!) 92/57 94/64  Pulse:    Resp:    Temp: 97.9 F (36.6 C) 98  F (36.7 C)  SpO2:      Intake/Output Summary (Last 24 hours) at 03/24/2024 1637 Last data filed at 03/23/2024 2000 Gross per 24 hour  Intake 100 ml  Output 450 ml  Net -350 ml   Last Weight  Most recent update: 03/23/2024  7:24 PM    Weight  79.2 kg (174 lb 9.7 oz)            Gen: Elderly Caucasian male chronically ill in appearance HEENT: moist mucous membranes CV: Regular rate and rhythm PULM: Breathing is even and nonlabored on 2 L nasal cannula ABD: soft/nontender EXT: LE edema Neuro: Alert and oriented x3  SUMMARY OF RECOMMENDATIONS   DNAR/DNI   Darren [name] would be open to long term dialysis If it were needed and offered   Plan to allow time for outcomes  Plan for SNF with OP Palliative support   The palliative care team will remain involved and supportive during hospitalization  ______________________________________________________________________________________ Rosaline Becton [place] Palliative Medicine Team Team Cell Phone: [phone] Please utilize secure chat with additional questions, if there is no response within 30 minutes please call the above phone number  MDM: High in the setting of complex decisions.   Palliative Medicine Team providers are available by phone from 7am to 7pm daily and can be reached through the team cell phone.  Should this patient require assistance outside of these hours, please call the patient's attending physician.

## 2024-03-24 NOTE — TOC Initial Note (Addendum)
 Transition of Care [facility]) - Initial/Assessment Note    Patient Details  Name: Darren [name] MRN: [id number] Date of Birth: 12-20-1940  [facility] CM/SW Contact:    Isaiah Public, LCSWA Phone Number: 03/24/2024, 2:57 PM  Clinical Narrative:                  CSW received consult for possible SNF placement at time of discharge. Patient gave CSW permission to speak with his spouse  Darren [name] regarding PT recommendations/dc plan of SNF placement at time of discharge for patient. Patients spouse reports PTA patient comes from home with her. Patient and patients spouse expressed understanding of PT recommendation and is agreeable to SNF placement for patient at time of discharge.  CSW discussed insurance authorization process. No further questions reported at this time. CSW to continue to follow and assist with discharge planning needs.   Expected Discharge Plan: Skilled Nursing Facility Barriers to Discharge: Continued Medical Work up   Patient Goals and CMS Choice Patient states their goals for this hospitalization and ongoing recovery are:: SNF CMS Medicare.gov Compare Post Acute Care list provided to:: Patient Choice offered to / list presented to : Patient, Spouse      Expected Discharge Plan and Services In-house Referral: Clinical Social Work     Living arrangements for the past 2 months: Single Family Home                                      Prior Living Arrangements/Services Living arrangements for the past 2 months: Single Family Home Lives with:: Spouse Patient language and need for interpreter reviewed:: Yes Do you feel safe going back to the place where you live?: No   SNF  Need for Family Participation in Patient Care: Yes (Comment) Care giver support system in place?: Yes (comment)   Criminal Activity/Legal Involvement Pertinent to Current Situation/Hospitalization: No - Comment as needed  Activities of Daily Living   ADL Screening  (condition at time of admission) Independently performs ADLs?: No Does the patient have a NEW difficulty with bathing/dressing/toileting/self-feeding that is expected to last >3 days?: Yes (Initiates electronic notice to provider for possible OT consult) Does the patient have a NEW difficulty with getting in/out of bed, walking, or climbing stairs that is expected to last >3 days?: Yes (Initiates electronic notice to provider for possible PT consult) Does the patient have a NEW difficulty with communication that is expected to last >3 days?: Yes (Initiates electronic notice to provider for possible SLP consult) Is the patient deaf or have difficulty hearing?: No Does the patient have difficulty seeing, even when wearing glasses/contacts?: No Does the patient have difficulty concentrating, remembering, or making decisions?: Yes  Permission Sought/Granted Permission sought to share information with : Case Manager, Magazine features editor, Family Supports Permission granted to share information with : Yes, Verbal Permission Granted  Share Information with NAME: Darren [name]  Permission granted to share info w AGENCY: SNF  Permission granted to share info w Relationship: spouse  Permission granted to share info w Contact Information: Darren [name] [phone]  Emotional Assessment Appearance:: Appears stated age Attitude/Demeanor/Rapport: Gracious Affect (typically observed): Calm Orientation: : Oriented to Self, Oriented to Place, Oriented to Situation Alcohol  / Substance Use: Not Applicable Psych Involvement: No (comment)  Admission diagnosis:  Hyperkalemia [E87.5] Uremia [N19] Acute renal failure, unspecified acute renal failure type (HCC) [N17.9] Altered mental status, unspecified altered mental status  type [R41.82] Acute renal failure superimposed on chronic kidney disease (HCC) [N17.9, N18.9] Sepsis, due to unspecified organism, unspecified whether acute organ dysfunction present  (HCC) [A41.9] Encephalopathy, unspecified type [G93.40] Patient Active Problem List   Diagnosis Date Noted   Iron  deficiency anemia due to chronic blood loss 03/22/2024   Cirrhosis (HCC) 03/22/2024   Acute renal failure superimposed on chronic kidney disease (HCC) 03/22/2024   SIRS (systemic inflammatory response syndrome) (HCC) 03/22/2024   Chronic anticoagulation 01/23/2023   ETOH abuse 06/16/2022   Chronic diastolic CHF (congestive heart failure) (HCC) 06/07/2022   Obstructive sleep apnea 09/11/2018   Shortness of breath 09/11/2018   bilateral leg edema 09/11/2018   Paroxysmal atrial fibrillation (HCC) 09/11/2018   Laboratory examination 09/11/2018   Essential hypertension 07/25/2018   Rhinitis, chronic 01/21/2018   GI bleed 03/07/2017   Cough variant asthma with ? component of UACS 03/30/2016   COPD  GOLD III 03/30/2016   Morbid obesity due to excess calories (HCC) 03/30/2016   PCP:  [name], MD Pharmacy:   [facility] DELIV[facility] - [place], [place] - [address] [place] [address] Phone: [phone] Fax: [phone]  [facility] [address] [place] [address] Phone: [phone] Fax: [phone]  [id number] [place], [place] - [address] [place] [address] Phone: [phone] Fax: [phone]     Social Drivers of Health (SDOH) Social History: SDOH Screenings   Food Insecurity: No Food Insecurity (03/22/2024)  Housing: Low Risk  (03/22/2024)  Transportation Needs: No Transportation Needs (03/22/2024)  Utilities: Not At Risk (03/22/2024)  Social Connections: Patient Unable To Answer (03/22/2024)  Tobacco Use: Medium Risk (03/23/2024)   SDOH Interventions:     Readmission Risk Interventions     No data to display

## 2024-03-25 DIAGNOSIS — N179 Acute kidney failure, unspecified: Secondary | ICD-10-CM | POA: Diagnosis not present

## 2024-03-25 DIAGNOSIS — L8990 Pressure ulcer of unspecified site, unspecified stage: Secondary | ICD-10-CM | POA: Insufficient documentation

## 2024-03-25 DIAGNOSIS — N189 Chronic kidney disease, unspecified: Secondary | ICD-10-CM | POA: Diagnosis not present

## 2024-03-25 LAB — PROTIME-INR
INR: 2.5 — ABNORMAL HIGH (ref 0.8–1.2)
Prothrombin Time: 28.3 s — ABNORMAL HIGH (ref 11.4–15.2)

## 2024-03-25 LAB — CBC WITH DIFFERENTIAL/PLATELET
Abs Immature Granulocytes: 0.19 K/uL — ABNORMAL HIGH (ref 0.00–0.07)
Basophils Absolute: 0 K/uL (ref 0.0–0.1)
Basophils Relative: 0 %
Eosinophils Absolute: 0 K/uL (ref 0.0–0.5)
Eosinophils Relative: 0 %
HCT: 31.2 % — ABNORMAL LOW (ref 39.0–52.0)
Hemoglobin: 9.8 g/dL — ABNORMAL LOW (ref 13.0–17.0)
Immature Granulocytes: 2 %
Lymphocytes Relative: 3 %
Lymphs Abs: 0.4 K/uL — ABNORMAL LOW (ref 0.7–4.0)
MCH: 27.1 pg (ref 26.0–34.0)
MCHC: 31.4 g/dL (ref 30.0–36.0)
MCV: 86.4 fL (ref 80.0–100.0)
Monocytes Absolute: 0.6 K/uL (ref 0.1–1.0)
Monocytes Relative: 5 %
Neutro Abs: 11.5 K/uL — ABNORMAL HIGH (ref 1.7–7.7)
Neutrophils Relative %: 90 %
Platelets: 496 K/uL — ABNORMAL HIGH (ref 150–400)
RBC: 3.61 MIL/uL — ABNORMAL LOW (ref 4.22–5.81)
RDW: 17.5 % — ABNORMAL HIGH (ref 11.5–15.5)
WBC: 12.7 K/uL — ABNORMAL HIGH (ref 4.0–10.5)
nRBC: 0 % (ref 0.0–0.2)

## 2024-03-25 LAB — BASIC METABOLIC PANEL WITH GFR
Anion gap: 13 (ref 5–15)
BUN: 73 mg/dL — ABNORMAL HIGH (ref 8–23)
CO2: 20 mmol/L — ABNORMAL LOW (ref 22–32)
Calcium: 8.3 mg/dL — ABNORMAL LOW (ref 8.9–10.3)
Chloride: 101 mmol/L (ref 98–111)
Creatinine, Ser: 2.4 mg/dL — ABNORMAL HIGH (ref 0.61–1.24)
GFR, Estimated: 26 mL/min — ABNORMAL LOW (ref >60)
Glucose, Bld: 167 mg/dL — ABNORMAL HIGH (ref 70–99)
Potassium: 3.7 mmol/L (ref 3.5–5.1)
Sodium: 134 mmol/L — ABNORMAL LOW (ref 135–145)

## 2024-03-25 LAB — PATHOLOGIST SMEAR REVIEW

## 2024-03-25 LAB — GLUCOSE, CAPILLARY
Glucose-Capillary: 168 mg/dL — ABNORMAL HIGH (ref 70–99)
Glucose-Capillary: 190 mg/dL — ABNORMAL HIGH (ref 70–99)

## 2024-03-25 LAB — AMMONIA: Ammonia: 32 umol/L (ref 9–35)

## 2024-03-25 LAB — BODY FLUID CULTURE W GRAM STAIN: Culture: NO GROWTH

## 2024-03-25 LAB — PHOSPHORUS: Phosphorus: 3 mg/dL (ref 2.5–4.6)

## 2024-03-25 MED ORDER — LORAZEPAM 2 MG/ML IJ SOLN: 0.5000 mg | INTRAMUSCULAR | Status: AC

## 2024-03-25 MED ORDER — HALOPERIDOL LACTATE 5 MG/ML IJ SOLN: 1.0000 mg | Freq: Four times a day (QID) | INTRAMUSCULAR | Status: DC | PRN

## 2024-03-25 MED ORDER — NEPRO/CARBSTEADY PO LIQD
237.0000 mL | Freq: Two times a day (BID) | ORAL | Status: DC
Start: 2024-03-25 — End: 2024-04-02
  Administered 2024-03-25 – 2024-04-02 (×11): 237 mL via ORAL

## 2024-03-25 MED ORDER — RENA-VITE PO TABS
1.0000 | ORAL_TABLET | Freq: Every day | ORAL | Status: DC
  Administered 2024-03-25 – 2024-04-01 (×8): 1 via ORAL
  Filled 2024-03-25 (×7): qty 1

## 2024-03-25 MED ORDER — CHLORHEXIDINE GLUCONATE CLOTH 2 % EX PADS
6.0000 | MEDICATED_PAD | Freq: Every day | CUTANEOUS | Status: DC
  Administered 2024-03-26: 6 via TOPICAL

## 2024-03-25 MED ORDER — TRAZODONE HCL 50 MG PO TABS
50.0000 mg | ORAL_TABLET | Freq: Every evening | ORAL | Status: DC | PRN
  Administered 2024-03-25 – 2024-03-31 (×2): 50 mg via ORAL
  Filled 2024-03-25 (×2): qty 1

## 2024-03-25 NOTE — Progress Notes (Signed)
 Darren [name] [facility] 10:52 AM  Subjective: Patient seen and examined and case discussed with his wife again and he is doing better than he was this weekend he is eating a little does not have any pain and no new complaints  Objective: Vital signs stable afebrile no acute distress abdomen is soft nontender probably some ascites but no pedal edema BUN and creatinine worse today CBC stable INR slight drop  Assessment: Multiple medical problems including cirrhosis  Plan: Octreotide  per renal team and see yesterday's note for suggestion about repeat diagnostic paracentesis to confirm adequate treatment and consider vitamin K and please let me know if I can be of any further assistance with this hospital stay  [facility]  office [phone] After 5PM or if no answer call [phone]

## 2024-03-25 NOTE — Progress Notes (Addendum)
   Palliative Medicine Inpatient Follow Up Note HPI: Darren [name] is a 83 year old male with medical history significant for cirrhosis with abdominal ascites (last paracentesis 9/4, 5.3L removed), atrial fibrillation on Eliquis , CHF, essential hypertension, GERD, and OSA who presented to the ED at [facility] 9/13 with AMS. Darren [name] is presently being treated for hepatorenal syndrome. He is also noted to have significant cirrhosis with an increased MELD. Palliative care has been asked to support GOC conversations.   Today's Discussion 03/25/2024  *Please note that this is a verbal dictation therefore any spelling or grammatical errors are due to the Dragon Medical One system interpretation.  Admitted for encephalopathy. Has been started on dialysis.  Chart reviewed inclusive of vital signs, progress notes, laboratory results, and diagnostic images.   I met with Darren [name] and his wife, Darren [name]. [name] continues to share that he feels improved. He will receive HD tomorrow. He has had improvement in all labs thus far. It remains uncertain as to whether or not the intervention of dialysis will be needed long term. We again reviewed this will be under the guidance of the nephrology team.   Darren [name] shares his abdomen is starting to feel tight and notes he will likely need another paracentesis.   We reviewed OP Palliative support as an option moving forward again to help with symptoms as well as more difficult conversations should Darren [name] neglect to improve. Patient wife does express openness in speaking with [facility].   I shared that we will remain involved as needed.   Questions and concerns addressed/Palliative Support Provided.   Objective Assessment: Vital Signs Vitals:   03/25/24 0904 03/25/24 1233  BP: 105/72 112/82  Pulse: (!) 101   Resp: 19 (!) 23  Temp: (!) 97.5 F (36.4 C) (!) 97 F (36.1 C)  SpO2: 99%     Intake/Output Summary (Last 24 hours) at 03/25/2024 1320 Last  data filed at 03/25/2024 0532 Gross per 24 hour  Intake 100 ml  Output 100 ml  Net 0 ml   Last Weight  Most recent update: 03/25/2024  5:32 AM    Weight  82.8 kg (182 lb 8.7 oz)            Gen: Elderly Caucasian male chronically ill in appearance HEENT: moist mucous membranes CV: Regular rate and rhythm PULM: Breathing is even and nonlabored on 2 L nasal cannula ABD: soft/nontender EXT: LE edema Neuro: Alert and oriented x3  SUMMARY OF RECOMMENDATIONS   DNAR/DNI   [name] would be open to long term dialysis If it were needed and offered   Plan to allow time for outcomes  Plan for SNF with OP Palliative support   The palliative care team will remain involved and supportive during hospitalization ______________________________________________________________________________________ Darren [name] [name] Palliative Medicine Team Team Cell Phone: [phone] Please utilize secure chat with additional questions, if there is no response within 30 minutes please call the above phone number  Time: 55  Palliative Medicine Team providers are available by phone from 7am to 7pm daily and can be reached through the team cell phone.  Should this patient require assistance outside of these hours, please call the patient's attending physician.

## 2024-03-25 NOTE — Progress Notes (Signed)
 PROGRESS NOTE    STAR CHEESE  FMW:[id number] DOB: [birth year] DOA: 03/22/2024 PCP: [name], MD   Brief Narrative:  Darren [name] is a 83 y.o. male with medical history significant of hypertension, CKD, atrial fibrillation, anemia, diastolic CHF, OSA, obesity, COPD, cirrhosis with ascites presented with worsening mental status for 5 days prior to coming followed by difficulty ambulating. 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 Has history of cirrhosis and has been getting paracentesis.  Last paracentesis was a week ago on 9/4 and had 5.3 L removed.  Did see a nephrologist a day prior, per chart review and there was concern about his worsening renal function and he was started on increased dose of spironolactone  with increased frequency of Lasix .  Presented to ED due to worsening symptoms.    Upon arrival to ED, hemodynamically stable. Lab workup included CMP with sodium 131, potassium 6.4, bicarb 11, BUN 162, creatinine elevated 3.34 from baseline of 1.4, glucose 149, albumin  1.7, alk phos 142.  CBC with leukocytosis to 18.6, hemoglobin near baseline 11, platelets 720.  PT and INR elevated at 27.7 and 2.4 respectively.  Lactic acid normal with repeat pending.  Lipase 86.  Respiratory panel for flu COVID RSV negative. Ammonia level normal.  Chest x-ray showed no acute abnormality. CT head showed no acute abnormality.  Patient received vancomycin , ceftriaxone , Flagyl  in the ED.  Also received midodrine , octreotide , albumin .  Received 1.5 L IV fluid.  For hyperkalemia received bicarb, Lokelma , calcium  gluconate, insulin  and dextrose . Critical care was consulted and evaluated patient, placed temporary dialysis catheter and did diagnostic paracentesis.  Patient was seen by nephrology, underwent hemodialysis same day.  Details below.  Assessment & Plan:   Principal Problem:   Acute renal failure superimposed on chronic kidney disease (HCC) Active  Problems:   COPD  GOLD III   Morbid obesity due to excess calories [facility])   Essential hypertension   Obstructive sleep apnea   Paroxysmal atrial fibrillation (HCC)   Chronic diastolic CHF (congestive heart failure) (HCC)   Cirrhosis (HCC)   SIRS (systemic inflammatory response syndrome) (HCC)  Acute uremic encephalopathy/AKI on CKD stage IIIa/hyperkalemia/metabolic acidosis/hyponatremia, POA: Baseline creatinine around 1.3-1.4 in September 2024.  Presented with creatinine of 334 which peaked at 3.8 along with progressive decline in mental status for past 5 days now with decreased ability to ambulate and significant confusion.  Likely HRS. Reportedly he is very cognitively sharp at baseline.  Patient received [facility] by critical care team, seen by nephrology, underwent urgent dialysis on 03/22/2024 for severe uremic encephalopathy.  Hyponatremia and hyperkalemia both improved.  Creatinine improved as well yesterday but slightly worse today with poor urine output.  Patient's encephalopathy has resolved.  He is fully alert and oriented.  Lasix  and Aldactone  held.  Received another hemodialysis on 03/23/2024.  Patient with decompensated liver cirrhosis here with severe sepsis due to possible SBP, POA: SIRS Met severe sepsis criteria based on tachycardia, leukocytosis and encephalopathy as well as AKI on CKD.  Underwent paracentesis by critical care team, patient started on Flagyl , Rocephin  and vancomycin  fluid analysis indicative of SBP so Flagyl  and vancomycin  discontinued 03/23/2024.  Patient's INR 2.8 on 03/23/2024, Eliquis  discontinued 03/24/2024 per GI recommendations.  Continue midodrine , octreotide  and PPI.   Hypertension - Holding antihypertensives in the setting of low blood pressure and initiation of dialysis.   Atrial fibrillation - Holding metoprolol  with low blood pressure.  Rates controlled.  Discontinued Eliquis  as mentioned above.  Acute normocytic anemia, POA: Hemoglobin was around 11  about a year ago, presented with 11.9 yesterday and down to 7.5 on 03/23/2024 however it improved to 9.2 on 03/24/2024 without any transfusion..  No reports of hematochezia, hematemesis or melena.    Diastolic CHF > Echo in 2022 with EF 50-55%, G2 DD, moderate pulmonary regurgitation. - Holding diuretics in the setting of dehydration/AKI/initiation of HD.   OSA - Continue home CPAP   COPD - Replaced home Dulera  with formulary Breo   DVT prophylaxis:    Code Status: Limited: Do not attempt resuscitation (DNR) -DNR-LIMITED -Do Not Intubate/DNI   Family Communication: Wife and multiple/3 other family members present at bedside.  Plan of care discussed with patient in length and he/she verbalized understanding and agreed with it.  Status is: Inpatient Remains inpatient appropriate because: Per family, he is going to have another dialysis today.   Estimated body mass index is 25.46 kg/m as calculated from the following:   Height as of this encounter: 5' 11 (1.803 m).   Weight as of this encounter: 82.8 kg.    Nutritional Assessment: Body mass index is 25.46 kg/m.SABRA Seen by dietician.  I agree with the assessment and plan as outlined below: Nutrition Status:        . Skin Assessment: I have examined the patient's skin and I agree with the wound assessment as performed by the wound care RN as outlined below:    Consultants:  GI, nephrology, critical care  Procedures:  As above  Antimicrobials:  Anti-infectives (From admission, onward)    Start     Dose/Rate Route Frequency Ordered Stop   03/23/24 1100  rifaximin  (XIFAXAN ) tablet 550 mg        550 mg Oral 2 times daily 03/23/24 1003     03/22/24 2200  metroNIDAZOLE  (FLAGYL ) IVPB 500 mg  Status:  Discontinued        500 mg 100 mL/hr over 60 Minutes Intravenous Every 12 hours 03/22/24 1226 03/23/24 0849   03/22/24 1259  vancomycin  variable dose per unstable renal function (pharmacist dosing)  Status:  Discontinued          Does not apply See admin instructions 03/22/24 1259 03/23/24 0849   03/22/24 1130  cefTRIAXone  (ROCEPHIN ) 2 g in sodium chloride  0.9 % 100 mL IVPB        2 g 200 mL/hr over 30 Minutes Intravenous Every 24 hours 03/22/24 1120     03/22/24 1030  ceFEPIme  (MAXIPIME ) 2 g in sodium chloride  0.9 % 100 mL IVPB  Status:  Discontinued        2 g 200 mL/hr over 30 Minutes Intravenous  Once 03/22/24 1021 03/22/24 1120   03/22/24 1030  metroNIDAZOLE  (FLAGYL ) IVPB 500 mg        500 mg 100 mL/hr over 60 Minutes Intravenous  Once 03/22/24 1021 03/22/24 1421   03/22/24 1030  vancomycin  (VANCOREADY) IVPB 1750 mg/350 mL        1,750 mg 175 mL/hr over 120 Minutes Intravenous  Once 03/22/24 1022 03/22/24 2137         Subjective: Patient seen and examined, wife at the bedside.  Patient was being seen by dietitian.  Patient fully alert and oriented.  Denies any complaint.  Denies any shortness of breath or abdominal pain.  Objective: Vitals:   03/24/24 1633 03/24/24 2004 03/25/24 0500 03/25/24 0530  BP: 94/64 111/65  (!) 111/96  Pulse:  80  (!) 105  Resp:  18  18  Temp: 98 F (36.7 C) 97.7 F (36.5 C)  98.3 F (36.8 C)  TempSrc: Oral Oral  Oral  SpO2:  99%  100%  Weight:   82.8 kg 82.8 kg  Height:        Intake/Output Summary (Last 24 hours) at 03/25/2024 0737 Last data filed at 03/25/2024 0532 Gross per 24 hour  Intake 100 ml  Output 100 ml  Net 0 ml   Filed Weights   03/23/24 1922 03/25/24 0500 03/25/24 0530  Weight: 79.2 kg 82.8 kg 82.8 kg    Examination:  General exam: Appears calm and comfortable  Respiratory system: Diminished breath sounds due to poor inspiratory effort.  No crackles, rhonchi or wheezes.. Cardiovascular system: S1 & S2 heard, RRR. No JVD, murmurs, rubs, gallops or clicks. No pedal edema. Gastrointestinal system: Abdomen is soft, distended with positive fluid shift and nontender. No organomegaly or masses felt. Normal bowel sounds heard. Central nervous system:  Alert and oriented. No focal neurological deficits. Extremities: Symmetric 5 x 5 power. Skin: No rashes, lesions or ulcers.   Data Reviewed: I have personally reviewed following labs and imaging studies  CBC: Recent Labs  Lab 03/22/24 0852 03/22/24 0929 03/23/24 0517 03/23/24 0825 03/23/24 1412 03/24/24 0535 03/25/24 0551  WBC 18.6*  --  12.6*  --  12.1* 10.8* 12.7*  NEUTROABS 17.5*  --   --   --   --   --  11.5*  HGB 11.0*   < > 7.8* 7.5* 8.1* 9.2* 9.8*  HCT 35.8*   < > 24.3* 23.7* 25.4* 29.3* 31.2*  MCV 89.9  --  87.1  --  87.9 87.5 86.4  PLT 720*  --  468*  --  473* 503* 496*   < > = values in this interval not displayed.   Basic Metabolic Panel: Recent Labs  Lab 03/22/24 0852 03/22/24 0929 03/23/24 0518 03/24/24 0535 03/25/24 0551  NA 131* 132* 136 137 134*  K 6.4* 6.4* 4.6 3.9 3.7  CL 108 114* 106 103 101  CO2 11*  --  16* 20* 20*  GLUCOSE 149* 143* 102* 98 167*  BUN 162* >130* 108* 63* 73*  CREATININE 3.34* 3.80* 2.44* 1.94* 2.40*  CALCIUM  9.2  --  8.4* 8.3* 8.3*  PHOS  --   --  5.0* 3.3 3.0   GFR: Estimated Creatinine Clearance: 25.7 mL/min (A) (by C-G formula based on SCr of 2.4 mg/dL (H)). Liver Function Tests: Recent Labs  Lab 03/22/24 0852 03/23/24 0517 03/23/24 0518 03/24/24 0535  AST 32 34  --   --   ALT 24 18  --   --   ALKPHOS 142* 97  --   --   BILITOT 0.3 0.6  --   --   PROT 6.5 5.7*  --   --   ALBUMIN  1.7* 2.4* 2.4* 2.2*   Recent Labs  Lab 03/22/24 0852  LIPASE 86*   Recent Labs  Lab 03/22/24 0852  AMMONIA 28   Coagulation Profile: Recent Labs  Lab 03/22/24 0852 03/23/24 0517 03/24/24 0921  INR 2.4* 2.8* 2.8*   Cardiac Enzymes: No results for input(s): CKTOTAL, CKMB, CKMBINDEX, TROPONINI in the last 168 hours. BNP (last 3 results) No results for input(s): PROBNP in the last 8760 hours. HbA1C: No results for input(s): HGBA1C in the last 72 hours. CBG: Recent Labs  Lab 03/24/24 1136 03/24/24 1632  03/24/24 2006 03/24/24 2358 03/25/24 0528  GLUCAP 108* 145* 196* 183* 168*   Lipid Profile: No results  for input(s): CHOL, HDL, LDLCALC, TRIG, CHOLHDL, LDLDIRECT in the last 72 hours. Thyroid Function Tests: No results for input(s): TSH, T4TOTAL, FREET4, T3FREE, THYROIDAB in the last 72 hours. Anemia Panel: Recent Labs    03/24/24 0535  FERRITIN 407*  TIBC 104*  IRON  12*   Sepsis Labs: Recent Labs  Lab 03/22/24 0931  LATICACIDVEN 0.9    Recent Results (from the past 240 hours)  Resp panel by RT-PCR (RSV, Flu A&B, Covid) Anterior Nasal Swab     Status: None   Collection Time: 03/22/24  8:30 AM   Specimen: Anterior Nasal Swab  Result Value Ref Range Status   SARS Coronavirus 2 by RT PCR NEGATIVE NEGATIVE Final   Influenza A by PCR NEGATIVE NEGATIVE Final   Influenza B by PCR NEGATIVE NEGATIVE Final    Comment: (NOTE) The Xpert Xpress SARS-CoV-2/FLU/RSV plus assay is intended as an aid in the diagnosis of influenza from Nasopharyngeal swab specimens and should not be used as a sole basis for treatment. Nasal washings and aspirates are unacceptable for Xpert Xpress SARS-CoV-2/FLU/RSV testing.  Fact Sheet for Patients: BloggerCourse.com  Fact Sheet for Healthcare Providers: SeriousBroker.it  This test is not yet approved or cleared by the [place]  FDA and has been authorized for detection and/or diagnosis of SARS-CoV-2 by FDA under an Emergency Use Authorization (EUA). This EUA will remain in effect (meaning this test can be used) for the duration of the COVID-19 declaration under Section 564(b)(1) of the Act, 21 U.S.C. section 360bbb-3(b)(1), unless the authorization is terminated or revoked.     Resp Syncytial Virus by PCR NEGATIVE NEGATIVE Final    Comment: (NOTE) Fact Sheet for Patients: BloggerCourse.com  Fact Sheet for Healthcare  Providers: SeriousBroker.it  This test is not yet approved or cleared by the [place]  FDA and has been authorized for detection and/or diagnosis of SARS-CoV-2 by FDA under an Emergency Use Authorization (EUA). This EUA will remain in effect (meaning this test can be used) for the duration of the COVID-19 declaration under Section 564(b)(1) of the Act, 21 U.S.C. section 360bbb-3(b)(1), unless the authorization is terminated or revoked.  Performed at [facility], [address], [place], [place] [address]   Blood culture (routine x 2)     Status: None (Preliminary result)   Collection Time: 03/22/24  9:15 AM   Specimen: BLOOD  Result Value Ref Range Status   Specimen Description BLOOD RIGHT ANTECUBITAL  Final   Special Requests   Final    BOTTLES DRAWN AEROBIC AND ANAEROBIC Blood Culture adequate volume   Culture   Final    NO GROWTH 2 DAYS Performed at [facility], [address] [place], [place] [address]    Report Status PENDING  Incomplete  Blood culture (routine x 2)     Status: None (Preliminary result)   Collection Time: 03/22/24  9:20 AM   Specimen: BLOOD  Result Value Ref Range Status   Specimen Description BLOOD LEFT ANTECUBITAL  Final   Special Requests   Final    BOTTLES DRAWN AEROBIC AND ANAEROBIC Blood Culture adequate volume   Culture   Final    NO GROWTH 2 DAYS Performed at [facility], [address], [place], [place] [address]    Report Status PENDING  Incomplete  Body fluid culture w Gram Stain     Status: None (Preliminary result)   Collection Time: 03/22/24 12:25 PM   Specimen: Peritoneal Washings; Peritoneal Fluid  Result Value Ref  Range Status   Specimen Description PERITONEAL  Final   Special Requests NONE  Final   Gram Stain RARE WBC SEEN NO ORGANISMS SEEN   Final   Culture   Final    NO GROWTH 2 DAYS Performed at [facility], [address], [place], [place] [address]    Report Status  PENDING  Incomplete  MRSA Next Gen by PCR, Nasal     Status: None   Collection Time: 03/22/24  7:21 PM   Specimen: Nasal Mucosa; Nasal Swab  Result Value Ref Range Status   MRSA by PCR Next Gen NOT DETECTED NOT DETECTED Final    Comment: (NOTE) The GeneXpert MRSA Assay (FDA approved for NASAL specimens only), is one component of a comprehensive MRSA colonization surveillance program. It is not intended to diagnose MRSA infection nor to guide or monitor treatment for MRSA infections. Test performance is not FDA approved in patients less than 37 years old. Performed at [facility], [address] [place], [place] [address]      Radiology Studies: No results found.   Scheduled Meds:  atorvastatin   40 mg Oral Daily   Chlorhexidine  Gluconate Cloth  6 each Topical Q0600   Chlorhexidine  Gluconate Cloth  6 each Topical Q0600   colesevelam   1,875 mg Oral BID WC   feeding supplement  237 mL Oral BID BM   fluticasone  furoate-vilanterol  1 puff Inhalation Daily   folic acid   1 mg Oral Daily   iron  polysaccharides  150 mg Oral Daily   midodrine   10 mg Oral TID WC   octreotide   100 mcg Subcutaneous Q8H   mouth rinse  15 mL Mouth Rinse 4 times per day   pantoprazole   40 mg Oral BID AC   rifaximin   550 mg Oral BID   sodium chloride  flush  3 mL Intravenous Q12H   thiamine   100 mg Oral Daily   Continuous Infusions:  cefTRIAXone  (ROCEPHIN )  IV Stopped (03/24/24 1213)     LOS: 3 days   Fredia Skeeter, MD [facility]  03/25/2024, 7:37 AM   *Please note that this is a verbal dictation therefore any spelling or grammatical errors are due to the Dragon Medical One system interpretation.  Please page via Amion and do not message via secure chat for urgent patient care matters. Secure chat can be used for non urgent patient care matters.  How to contact the [facility] Attending or Consulting provider 7A - 7P or covering provider during after hours 7P -7A, for this patient?  Check  the care team in [facility] and look for a) attending/consulting [facility] provider listed and b) the [facility] team listed. Page or secure chat 7A-7P. Log into [email or web address] and use [place]'s universal password to access. If you do not have the password, please contact the hospital operator. Locate the [facility] provider you are looking for under [facility] and page to a number that you can be directly reached. If you still have difficulty reaching the provider, please page the [facility] (Director on Call) for the Hospitalists listed on amion for assistance.

## 2024-03-25 NOTE — TOC Progression Note (Signed)
 Transition of Care [facility]) - Progression Note    Patient Details  Name: Darren [name] MRN: [id number] Date of Birth: [birth year]  Transition of Care [facility]) CM/SW Contact  Isaiah Public, LCSWA Phone Number: 03/25/2024, 12:38 PM  Clinical Narrative:     Due to patients current orientation CSW spoke with patients spouse Darren [name] and provided patients SNF bed offers. Darren [name] accepted SNF bed offer with [facility] place for patient.  CSW received consult from palliative for outpatient palliative services to follow patient at SNF. CSW spoke with Darren [name] patients spouse who politely declined for CSW to make referral for palliative services to follow patient at snf. Patients spouse said she is open to speaking with palliative team but not ready for palliative services to follow patient outpatient at this time. CSW informed Rosaline NP with palliative team. CSW will continue to follow.  Expected Discharge Plan: Skilled Nursing Facility Barriers to Discharge: Continued Medical Work up               Expected Discharge Plan and Services In-house Referral: Clinical Social Work     Living arrangements for the past 2 months: Single Family Home                                       Social Drivers of Health (SDOH) Interventions SDOH Screenings   Food Insecurity: No Food Insecurity (03/22/2024)  Housing: Low Risk  (03/22/2024)  Transportation Needs: No Transportation Needs (03/22/2024)  Utilities: Not At Risk (03/22/2024)  Social Connections: Patient Unable To Answer (03/22/2024)  Tobacco Use: Medium Risk (03/23/2024)    Readmission Risk Interventions     No data to display

## 2024-03-25 NOTE — Plan of Care (Addendum)
 Concern for hospital-acquired delirium versus uremic encephalopathy RN reported that patient is very restless, anxious and concern for he might leave the hospital floor. Per patient family requesting something to calm patient down and helped with the sleep. -Giving 0.5 mg of Ativan , trazodone  50 mg nightly as needed for insomnia and Haldol  1 mg every 6 as needed for agitation. - Checking ammonia level given patient has underlying cirrhosis. -Also continue delirium precaution. -Patient is due for dialysis tomorrow

## 2024-03-25 NOTE — Progress Notes (Signed)
 [facility] NEPHROLOGY PROGRESS NOTE  Assessment/ Plan: Pt is a 83 y.o. 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    # Acute kidney injury on CKD IIIa with severe uremic encephalopathy, hyperkalemia and acidosis: Likely due to combination of reduced effective circulatory volume caused by recent paracentesis, use of spironolactone , diuretics and HRS. It seems like the dose of Aldactone  and diuretics increase recently to manage recurrent ascites.  Kidney US  with no hydro. -Started dialysis on 9/13 for severe uremic encephalopathy and multiple metabolic abnormalities.  Reportedly he was at his normal mental status until about a week ago.   -------------- - Plan for HD again tomorrow; no acute need for HD today  - Agree with continuing midodrine  and octreotide .  No longer getting albumin  and would hold off - wet cough  - assess diuretic needs daily  - Remain off of Aldactone   - Ordered strict ins/outs, daily weights  - Note that he may not be a good candidate for long-term outpatient dialysis.  Continuing to monitor for renal recovery at this time   # Hyperkalemia due to AKI, Aldactone :  - Improved with HD   # Severe metabolic acidosis with AKI:  - improved with renal replacement therapy as above.   # Presumed sepsis, unknown source:  - Blood cultures 9/13 NGTD  - receiving abx per primary team (on ceftriaxone )   # History of liver cirrhosis, ascites, hypoalbuminemia requiring frequent paracentesis.  Seen by GI on Xifaxan .  # Normocytic Anemia - some component of dilution after receiving fluid in ER.  He looked dry and dehydrated on presentation.  - iron  deficiency - start oral iron  and if infection ruled out would give IV iron   -  Transfuse as needed per primary team   Disposition - continue inpatient monitoring     Subjective:  He had 100 mL uop over 9/15.  Last HD on 9/14 with no UF.  Spoke with his wife and his son at bedside.  He looked a little sharper/more alert to them yesterday and Sunday.  He has been getting paracentesis every month.  Has seen Dr. Melia outpatient.    Review of systems:    He denies overt shortness of breath; no chest pain  No n/v   Objective Vital signs in last 24 hours: Vitals:   03/25/24 0530 03/25/24 0852 03/25/24 0904 03/25/24 1233  BP: (!) 111/96  105/72 112/82  Pulse: (!) 105 (!) 108 (!) 101   Resp: 18 18 19  (!) 23  Temp: 98.3 F (36.8 C)  (!) 97.5 F (36.4 C) (!) 97 F (36.1 C)  TempSrc: Oral  Oral Oral  SpO2: 100%  99%   Weight: 82.8 kg     Height:       Weight change: 3.6 kg  Intake/Output Summary (Last 24 hours) at 03/25/2024 1257 Last data filed at 03/25/2024 0532 Gross per 24 hour  Intake 100 ml  Output 100 ml  Net 0 ml       Labs: RENAL PANEL Recent Labs  Lab 03/22/24 0852 03/22/24 0929 03/23/24 0517 03/23/24 0518 03/24/24 0535 03/25/24 0551  NA 131* 132*  --  136 137 134*  K 6.4* 6.4*  --  4.6 3.9 3.7  CL 108 114*  --  106 103 101  CO2  11*  --   --  16* 20* 20*  GLUCOSE 149* 143*  --  102* 98 167*  BUN 162* >130*  --  108* 63* 73*  CREATININE 3.34* 3.80*  --  2.44* 1.94* 2.40*  CALCIUM  9.2  --   --  8.4* 8.3* 8.3*  PHOS  --   --   --  5.0* 3.3 3.0  ALBUMIN  1.7*  --  2.4* 2.4* 2.2*  --     Liver Function Tests: Recent Labs  Lab 03/22/24 0852 03/23/24 0517 03/23/24 0518 03/24/24 0535  AST 32 34  --   --   ALT 24 18  --   --   ALKPHOS 142* 97  --   --   BILITOT 0.3 0.6  --   --   PROT 6.5 5.7*  --   --   ALBUMIN  1.7* 2.4* 2.4* 2.2*   Recent Labs  Lab 03/22/24 0852  LIPASE 86*   Recent Labs  Lab 03/22/24 0852  AMMONIA 28   CBC: Recent Labs    03/23/24 0517 03/23/24 0825 03/23/24 1412 03/24/24 0535 03/25/24 0551   HGB 7.8* 7.5* 8.1* 9.2* 9.8*  MCV 87.1  --  87.9 87.5 86.4  FERRITIN  --   --   --  407*  --   TIBC  --   --   --  104*  --   IRON   --   --   --  12*  --     Cardiac Enzymes: No results for input(s): CKTOTAL, CKMB, CKMBINDEX, TROPONINI in the last 168 hours. CBG: Recent Labs  Lab 03/24/24 1136 03/24/24 1632 03/24/24 2006 03/24/24 2358 03/25/24 0528  GLUCAP 108* 145* 196* 183* 168*    Iron  Studies:  Recent Labs    03/24/24 0535  IRON  12*  TIBC 104*  FERRITIN 407*   Studies/Results: No results found.   Medications: Infusions:  cefTRIAXone  (ROCEPHIN )  IV Stopped (03/24/24 1213)    Scheduled Medications:  atorvastatin   40 mg Oral Daily   Chlorhexidine  Gluconate Cloth  6 each Topical Q0600   Chlorhexidine  Gluconate Cloth  6 each Topical Q0600   colesevelam   1,875 mg Oral BID WC   feeding supplement (NEPRO CARB STEADY)  237 mL Oral BID BM   fluticasone  furoate-vilanterol  1 puff Inhalation Daily   folic acid   1 mg Oral Daily   iron  polysaccharides  150 mg Oral Daily   midodrine   10 mg Oral TID WC   octreotide   100 mcg Subcutaneous Q8H   mouth rinse  15 mL Mouth Rinse 4 times per day   pantoprazole   40 mg Oral BID AC   rifaximin   550 mg Oral BID   sodium chloride  flush  3 mL Intravenous Q12H   thiamine   100 mg Oral Daily    have reviewed scheduled and prn medications.  Physical Exam:    General elderly male in bed in no acute distress, chronically ill-appearing  HEENT normocephalic atraumatic extraocular movements intact sclera anicteric Neck supple trachea midline Lungs clear to auscultation bilaterally normal work of breathing at rest; occasional wet-sounding cough  Heart regular rate and rhythm no rubs or gallops appreciated Abdomen soft distended and non-tender Extremities no pittin edema  Psych normal mood and affect Neuro alert and conversant GU condom catheter in place Access RIJ nontunneled dialysis catheter      Katheryn JAYSON Saba 03/25/2024,1:19 PM  LOS: 3 days

## 2024-03-25 NOTE — Progress Notes (Signed)
 Initial Nutrition Assessment  DOCUMENTATION CODES:   Severe malnutrition in context of chronic illness  INTERVENTION:  -Liberalize diet to regular menu to promote increased intake -Add Nepro BID  -Add Magic Cup BID -Rena vit -Staff/family to continue to encourage PO intake  NUTRITION DIAGNOSIS:   Severe Malnutrition related to chronic illness, poor appetite, lethargy/confusion as evidenced by severe muscle depletion, severe fat depletion.  GOAL:   Patient will meet greater than or equal to 90% of their needs  MONITOR:   PO intake, Supplement acceptance, Labs, Weight trends, Skin  REASON FOR ASSESSMENT:   Malnutrition Screening Tool    ASSESSMENT:   Hx hypertension, CKD, atrial fibrillation, anemia, diastolic CHF, OSA, obesity, COPD, cirrhosis with ascites presented with worsening mental status.  Spoke to pt and wife in room. No noted or stated n/v or chewing/swallowing difficulties. Last BM 9/14 at home on day of admit, incontinent and loose. Pt with no documented meal intake, visit with breakfast try still in room-a couple bites of pancakes. Discussed current diet order, recommend liberalization to promote increased options. Discussed ONS options, add Nepro BID and Magic Cup BID. Discussed importance of adequate kcal/pro intake for healing, increased needs, fueling body, etc. Pt with significant ascites and repeated paracentesis-difficult to assess weight trends. Pt's wife states previous UBW ~250 lbs (unsure of when), most recent weight 182 lbs, though, with ascites present. NFPE completed (see below), pt meets criteria for severe protein calorie malnutrition. Pt kidney function continues to monitored, is receiving HD, however, nephrology unsure if good candidate for long term outpatient HD; plan to continue to monitor for renal recovery. Pt discharge plan is SNF. Pt/wife deny additional questions/concerns at this time, will continue to monitor, RDN available prn.   Labs BG  167-196 Na 134 BUN 73 Creat 2.4 Ca 8.3 Albumin  2.2 Lipase 86 GFR 26 H/H 9.8/31.2  Medications  atorvastatin   40 mg Oral Daily   Chlorhexidine  Gluconate Cloth  6 each Topical Q0600   Chlorhexidine  Gluconate Cloth  6 each Topical Q0600   colesevelam   1,875 mg Oral BID WC   feeding supplement (NEPRO CARB STEADY)  237 mL Oral BID BM   fluticasone  furoate-vilanterol  1 puff Inhalation Daily   folic acid   1 mg Oral Daily   iron  polysaccharides  150 mg Oral Daily   midodrine   10 mg Oral TID WC   octreotide   100 mcg Subcutaneous Q8H   mouth rinse  15 mL Mouth Rinse 4 times per day   pantoprazole   40 mg Oral BID AC   rifaximin   550 mg Oral BID   sodium chloride  flush  3 mL Intravenous Q12H   thiamine   100 mg Oral Daily     NUTRITION - FOCUSED PHYSICAL EXAM:  Flowsheet Row Most Recent Value  Orbital Region Moderate depletion  Upper Arm Region Severe depletion  Thoracic and Lumbar Region Severe depletion  Buccal Region Moderate depletion  Temple Region Moderate depletion  Clavicle Bone Region Severe depletion  Clavicle and Acromion Bone Region Severe depletion  Scapular Bone Region Severe depletion  Dorsal Hand Severe depletion  Patellar Region Moderate depletion  Anterior Thigh Region Moderate depletion  Posterior Calf Region Moderate depletion  Edema (RD Assessment) None  Hair Reviewed  Eyes Reviewed  Mouth Reviewed  Skin Reviewed  Nails Reviewed    Diet Order:   Diet Order             Diet regular Room service appropriate? Yes with Assist; Fluid consistency: Thin  Diet  effective now                   EDUCATION NEEDS:   Education needs have been addressed  Skin:  Skin Assessment: Skin Integrity Issues: Skin Integrity Issues:: Stage I Stage I: Sacrum  Last BM:  9/14  Height:   Ht Readings from Last 1 Encounters:  03/22/24 5' 11 (1.803 m)    Weight:   Wt Readings from Last 1 Encounters:  03/25/24 82.8 kg    BMI:  Body mass index is 25.46  kg/m.  Estimated Nutritional Needs:   Kcal:  1650-2000 kcal  Protein:  100-125 g  Fluid:  >/=1.6 L  Kalissa Grays Daml-Budig, RDN, LDN Registered Dietitian Nutritionist RD Inpatient Contact Info in [place]

## 2024-03-26 ENCOUNTER — Inpatient Hospital Stay (HOSPITAL_COMMUNITY)

## 2024-03-26 DIAGNOSIS — N179 Acute kidney failure, unspecified: Secondary | ICD-10-CM | POA: Diagnosis not present

## 2024-03-26 DIAGNOSIS — E43 Unspecified severe protein-calorie malnutrition: Secondary | ICD-10-CM | POA: Insufficient documentation

## 2024-03-26 DIAGNOSIS — Z515 Encounter for palliative care: Secondary | ICD-10-CM

## 2024-03-26 DIAGNOSIS — N189 Chronic kidney disease, unspecified: Secondary | ICD-10-CM | POA: Diagnosis not present

## 2024-03-26 HISTORY — PX: IR PARACENTESIS: IMG2679

## 2024-03-26 LAB — CBC WITH DIFFERENTIAL/PLATELET
Abs Immature Granulocytes: 0.2 K/uL — ABNORMAL HIGH (ref 0.00–0.07)
Basophils Absolute: 0 K/uL (ref 0.0–0.1)
Basophils Relative: 0 %
Eosinophils Absolute: 0.1 K/uL (ref 0.0–0.5)
Eosinophils Relative: 1 %
HCT: 32.5 % — ABNORMAL LOW (ref 39.0–52.0)
Hemoglobin: 10 g/dL — ABNORMAL LOW (ref 13.0–17.0)
Immature Granulocytes: 2 %
Lymphocytes Relative: 4 %
Lymphs Abs: 0.4 K/uL — ABNORMAL LOW (ref 0.7–4.0)
MCH: 27.2 pg (ref 26.0–34.0)
MCHC: 30.8 g/dL (ref 30.0–36.0)
MCV: 88.6 fL (ref 80.0–100.0)
Monocytes Absolute: 0.7 K/uL (ref 0.1–1.0)
Monocytes Relative: 7 %
Neutro Abs: 8.9 K/uL — ABNORMAL HIGH (ref 1.7–7.7)
Neutrophils Relative %: 86 %
Platelets: 415 K/uL — ABNORMAL HIGH (ref 150–400)
RBC: 3.67 MIL/uL — ABNORMAL LOW (ref 4.22–5.81)
RDW: 17.6 % — ABNORMAL HIGH (ref 11.5–15.5)
WBC: 10.4 K/uL (ref 4.0–10.5)
nRBC: 0.2 % (ref 0.0–0.2)

## 2024-03-26 LAB — BASIC METABOLIC PANEL WITH GFR
Anion gap: 17 — ABNORMAL HIGH (ref 5–15)
BUN: 78 mg/dL — ABNORMAL HIGH (ref 8–23)
CO2: 19 mmol/L — ABNORMAL LOW (ref 22–32)
Calcium: 8.9 mg/dL (ref 8.9–10.3)
Chloride: 100 mmol/L (ref 98–111)
Creatinine, Ser: 2.81 mg/dL — ABNORMAL HIGH (ref 0.61–1.24)
GFR, Estimated: 22 mL/min — ABNORMAL LOW (ref >60)
Glucose, Bld: 129 mg/dL — ABNORMAL HIGH (ref 70–99)
Potassium: 4.4 mmol/L (ref 3.5–5.1)
Sodium: 136 mmol/L (ref 135–145)

## 2024-03-26 LAB — GLUCOSE, CAPILLARY
Glucose-Capillary: 141 mg/dL — ABNORMAL HIGH (ref 70–99)
Glucose-Capillary: 149 mg/dL — ABNORMAL HIGH (ref 70–99)
Glucose-Capillary: 183 mg/dL — ABNORMAL HIGH (ref 70–99)
Glucose-Capillary: 87 mg/dL (ref 70–99)

## 2024-03-26 LAB — PROTIME-INR
INR: 2.2 — ABNORMAL HIGH (ref 0.8–1.2)
Prothrombin Time: 25.4 s — ABNORMAL HIGH (ref 11.4–15.2)

## 2024-03-26 MED ORDER — MIDODRINE HCL 5 MG PO TABS
ORAL_TABLET | ORAL | Status: AC
  Filled 2024-03-26: qty 2

## 2024-03-26 MED ORDER — ALBUMIN HUMAN 25 % IV SOLN
25.0000 g | Freq: Once | INTRAVENOUS | Status: AC
  Administered 2024-03-26: 25 g via INTRAVENOUS

## 2024-03-26 MED ORDER — HEPARIN SODIUM (PORCINE) 1000 UNIT/ML DIALYSIS
1000.0000 [IU] | INTRAMUSCULAR | Status: DC | PRN
  Administered 2024-03-26: 1000 [IU]

## 2024-03-26 MED ORDER — LIDOCAINE-EPINEPHRINE 1 %-1:100000 IJ SOLN
INTRAMUSCULAR | Status: AC
  Filled 2024-03-26: qty 1

## 2024-03-26 MED ORDER — HEPARIN SODIUM (PORCINE) 1000 UNIT/ML IJ SOLN
INTRAMUSCULAR | Status: AC
  Filled 2024-03-26: qty 3

## 2024-03-26 MED ORDER — ALBUMIN HUMAN 25 % IV SOLN
INTRAVENOUS | Status: AC
  Filled 2024-03-26: qty 100

## 2024-03-26 MED ORDER — ALTEPLASE 2 MG IJ SOLR: 2.0000 mg | Freq: Once | INTRAMUSCULAR | Status: DC | PRN

## 2024-03-26 MED ORDER — ALBUMIN HUMAN 25 % IV SOLN: 12.5000 g | Freq: Once | INTRAVENOUS | Status: DC

## 2024-03-26 NOTE — Progress Notes (Signed)
 SLP Cancellation note Patient Details  Name: Darren [name] MRN: [id number] Date of Birth: [birth year]  Pt unavailable due to OTF for HD. SLP will re-attempt as coordination of care permits.   Today's Date: 03/26/2024 Time:    1525     Darren [name] 03/26/2024,3:25 PM

## 2024-03-26 NOTE — Plan of Care (Signed)
  Problem: Education: Goal: Knowledge of General Education information will improve Description: Including pain rating scale, medication(s)/side effects and non-pharmacologic comfort measures Outcome: Progressing   Problem: Health Behavior/Discharge Planning: Goal: Ability to manage health-related needs will improve Outcome: Progressing   Problem: Clinical Measurements: Goal: Ability to maintain clinical measurements within normal limits will improve Outcome: Progressing Goal: Will remain free from infection Outcome: Progressing Goal: Diagnostic test results will improve Outcome: Progressing Goal: Respiratory complications will improve Outcome: Progressing Goal: Cardiovascular complication will be avoided Outcome: Progressing   Problem: Activity: Goal: Risk for activity intolerance will decrease Outcome: Progressing   Problem: Nutrition: Goal: Adequate nutrition will be maintained Outcome: Progressing   Problem: Coping: Goal: Level of anxiety will decrease Outcome: Progressing   Problem: Elimination: Goal: Will not experience complications related to bowel motility Outcome: Progressing Goal: Will not experience complications related to urinary retention Outcome: Progressing   Problem: Pain Managment: Goal: General experience of comfort will improve and/or be controlled Outcome: Progressing   Problem: Safety: Goal: Ability to remain free from injury will improve Outcome: Progressing   Problem: Skin Integrity: Goal: Risk for impaired skin integrity will decrease Outcome: Progressing   Problem: Education: Goal: Knowledge of disease and its progression will improve Outcome: Progressing Goal: Individualized Educational Video(s) Outcome: Progressing   Problem: Fluid Volume: Goal: Compliance with measures to maintain balanced fluid volume will improve Outcome: Progressing   Problem: Health Behavior/Discharge Planning: Goal: Ability to manage health-related needs  will improve Outcome: Progressing   Problem: Nutritional: Goal: Ability to make healthy dietary choices will improve Outcome: Progressing   Problem: Clinical Measurements: Goal: Complications related to the disease process, condition or treatment will be avoided or minimized Outcome: Progressing   Problem: Education: Goal: Ability to demonstrate management of disease process will improve Outcome: Progressing Goal: Ability to verbalize understanding of medication therapies will improve Outcome: Progressing Goal: Individualized Educational Video(s) Outcome: Progressing   Problem: Activity: Goal: Capacity to carry out activities will improve Outcome: Progressing   Problem: Cardiac: Goal: Ability to achieve and maintain adequate cardiopulmonary perfusion will improve Outcome: Progressing   Problem: Education: Goal: Knowledge of disease or condition will improve Outcome: Progressing Goal: Understanding of medication regimen will improve Outcome: Progressing Goal: Individualized Educational Video(s) Outcome: Progressing   Problem: Activity: Goal: Ability to tolerate increased activity will improve Outcome: Progressing   Problem: Cardiac: Goal: Ability to achieve and maintain adequate cardiopulmonary perfusion will improve Outcome: Progressing   Problem: Health Behavior/Discharge Planning: Goal: Ability to safely manage health-related needs after discharge will improve Outcome: Progressing

## 2024-03-26 NOTE — TOC Progression Note (Signed)
 Transition of Care [facility]) - Progression Note    Patient Details  Name: Darren [name] MRN: [id number] Date of Birth: 12-16-1940  [facility] CM/SW Contact  Isaiah Public, LCSWA Phone Number: 03/26/2024, 1:42 PM  Clinical Narrative:     Patient has SNF bed at [facility] when medically stable. CSW will continue to follow.  Expected Discharge Plan: Skilled Nursing Facility Barriers to Discharge: Continued Medical Work up               Expected Discharge Plan and Services In-house Referral: Clinical Social Work     Living arrangements for the past 2 months: Single Family Home                                       Social Drivers of Health (SDOH) Interventions SDOH Screenings   Food Insecurity: No Food Insecurity (03/22/2024)  Housing: Low Risk  (03/22/2024)  Transportation Needs: No Transportation Needs (03/22/2024)  Utilities: Not At Risk (03/22/2024)  Social Connections: Patient Unable To Answer (03/22/2024)  Tobacco Use: Medium Risk (03/23/2024)    Readmission Risk Interventions     No data to display

## 2024-03-26 NOTE — Procedures (Signed)
 PROCEDURE SUMMARY:  Successful ultrasound guided paracentesis from the left lower quadrant.  Yielded 4.1 of straw colored fluid.  No immediate complications.  The patient tolerated the procedure well.    EBL < 2 mL  The patient has required >/=2 paracenteses in a 30 day period and a screening evaluation by the [facility] has been arranged.

## 2024-03-26 NOTE — Progress Notes (Addendum)
 PROGRESS NOTE    Darren [name]  FMW:[id number] DOB: 1940/08/27 DOA: 03/22/2024 PCP: [name], MD   Brief Narrative:  Darren [name] is a 83 y.o. male with medical history significant of hypertension, CKD, atrial fibrillation, anemia, diastolic CHF, OSA, obesity, COPD, cirrhosis with ascites presented with worsening mental status for 5 days prior to coming followed by difficulty ambulating. 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 Has history of cirrhosis and has been getting paracentesis.  Last paracentesis was a week ago on 9/4 and had 5.3 L removed.  Did see a nephrologist a day prior, per chart review and there was concern about his worsening renal function and he was started on increased dose of spironolactone  with increased frequency of Lasix .  Presented to ED due to worsening symptoms.    Upon arrival to ED, hemodynamically stable. Lab workup included CMP with sodium 131, potassium 6.4, bicarb 11, BUN 162, creatinine elevated 3.34 from baseline of 1.4, glucose 149, albumin  1.7, alk phos 142.  CBC with leukocytosis to 18.6, hemoglobin near baseline 11, platelets 720.  PT and INR elevated at 27.7 and 2.4 respectively.  Lactic acid normal with repeat pending.  Lipase 86.  Respiratory panel for flu COVID RSV negative. Ammonia level normal.  Chest x-ray showed no acute abnormality. CT head showed no acute abnormality.  Patient received vancomycin , ceftriaxone , Flagyl  in the ED.  Also received midodrine , octreotide , albumin .  Received 1.5 L IV fluid.  For hyperkalemia received bicarb, Lokelma , calcium  gluconate, insulin  and dextrose . Critical care was consulted and evaluated patient, placed temporary dialysis catheter and did diagnostic paracentesis.  Patient was seen by nephrology, underwent hemodialysis same day.  Details below.  Assessment & Plan:   Principal Problem:   Acute renal failure superimposed on chronic kidney disease (HCC) Active  Problems:   COPD  GOLD III   Morbid obesity due to excess calories (HCC)   Essential hypertension   Obstructive sleep apnea   Paroxysmal atrial fibrillation (HCC)   Chronic diastolic CHF (congestive heart failure) (HCC)   Cirrhosis (HCC)   SIRS (systemic inflammatory response syndrome) (HCC)   Pressure injury of skin   Protein-calorie malnutrition, severe  Acute uremic encephalopathy/AKI on CKD stage IIIa/hyperkalemia/metabolic acidosis/hyponatremia, POA: Baseline creatinine around 1.3-1.4 in September 2024.  Presented with creatinine of 334 which peaked at 3.8 along with progressive decline in mental status for past 5 days now with decreased ability to ambulate and significant confusion.  Likely HRS. Reportedly he is very cognitively sharp at baseline.  Patient received [facility] by critical care team, seen by nephrology, underwent urgent dialysis on 03/22/2024 for severe uremic encephalopathy.  Hyponatremia and hyperkalemia both improved.  Creatinine improved somewhat but then got worse yesterday.  BMP pending today patient's encephalopathy resolved yesterday, he appears slightly tired today.  Lasix  and Aldactone  held.  Dialysis per nephrology.  Acute hypoxic respiratory failure secondary to fluid overload: Patient has crackles, he is tachypneic and requiring oxygen this morning.  Also appears to have distended abdomen with worsening ascites.  Nephrology has been reached out by RN, patient is likely going to require dialysis sooner and I have consulted IR for therapeutic paracentesis as well.  Patient with decompensated liver cirrhosis here with severe sepsis due to possible SBP, POA: SIRS Met severe sepsis criteria based on tachycardia, leukocytosis and encephalopathy as well as AKI on CKD.  Underwent diagnostic paracentesis by critical care team, patient started on Flagyl , Rocephin  and vancomycin  fluid analysis indicative of  SBP so Flagyl  and vancomycin  discontinued 03/23/2024.  Patient's INR 2.5 on  03/23/2024, Eliquis  discontinued 03/24/2024 per GI recommendations.  Continue midodrine , octreotide  and PPI.  Patient appears to have worsening ascites but no tenderness.  Will go ahead and request IR to do therapeutic paracentesis.   Hypertension - Holding antihypertensives in the setting of low blood pressure and initiation of dialysis.   Atrial fibrillation - Holding metoprolol  with low blood pressure.  Rates controlled.  Discontinued Eliquis  as mentioned above.   Acute normocytic anemia, POA: Hemoglobin was around 11 about a year ago, presented with 11.9 yesterday and down to 7.5 on 03/23/2024 however it improved to 9.2 on 03/24/2024 without any transfusion..  No reports of hematochezia, hematemesis or melena.    Diastolic CHF > Echo in 2022 with EF 50-55%, G2 DD, moderate pulmonary regurgitation. - Holding diuretics in the setting of dehydration/AKI/initiation of HD.   OSA - Continue home CPAP   COPD - Replaced home Dulera  with formulary Breo  Diarrhea: Few days ago, nurses documented type VII stool, system prompted C. difficile checking, ordered C. difficile and precautions as well.  No further diarrhea.  Discontinued boat C. difficile and enteric precautions.   DVT prophylaxis:    Code Status: Limited: Do not attempt resuscitation (DNR) -DNR-LIMITED -Do Not Intubate/DNI   Family Communication: Wife and sister-in-law present at bedside.  Plan of care discussed.  Status is: Inpatient Remains inpatient appropriate because: Patient sicker today, needs urgent dialysis.  Estimated body mass index is 24.41 kg/m as calculated from the following:   Height as of this encounter: 5' 11 (1.803 m).   Weight as of this encounter: 79.4 kg.    Nutritional Assessment: Body mass index is 24.41 kg/m.SABRA Seen by dietician.  I agree with the assessment and plan as outlined below: Nutrition Status: Nutrition Problem: Severe Malnutrition Etiology: chronic illness, poor appetite,  lethargy/confusion Signs/Symptoms: severe muscle depletion, severe fat depletion Interventions: Refer to RD note for recommendations  . Skin Assessment: I have examined the patient's skin and I agree with the wound assessment as performed by the wound care RN as outlined below:    Consultants:  GI, nephrology, critical care  Procedures:  As above  Antimicrobials:  Anti-infectives (From admission, onward)    Start     Dose/Rate Route Frequency Ordered Stop   03/23/24 1100  rifaximin  (XIFAXAN ) tablet 550 mg        550 mg Oral 2 times daily 03/23/24 1003     03/22/24 2200  metroNIDAZOLE  (FLAGYL ) IVPB 500 mg  Status:  Discontinued        500 mg 100 mL/hr over 60 Minutes Intravenous Every 12 hours 03/22/24 1226 03/23/24 0849   03/22/24 1259  vancomycin  variable dose per unstable renal function (pharmacist dosing)  Status:  Discontinued         Does not apply See admin instructions 03/22/24 1259 03/23/24 0849   03/22/24 1130  cefTRIAXone  (ROCEPHIN ) 2 g in sodium chloride  0.9 % 100 mL IVPB        2 g 200 mL/hr over 30 Minutes Intravenous Every 24 hours 03/22/24 1120     03/22/24 1030  ceFEPIme  (MAXIPIME ) 2 g in sodium chloride  0.9 % 100 mL IVPB  Status:  Discontinued        2 g 200 mL/hr over 30 Minutes Intravenous  Once 03/22/24 1021 03/22/24 1120   03/22/24 1030  metroNIDAZOLE  (FLAGYL ) IVPB 500 mg        500 mg 100 mL/hr over  60 Minutes Intravenous  Once 03/22/24 1021 03/22/24 1421   03/22/24 1030  vancomycin  (VANCOREADY) IVPB 1750 mg/350 mL        1,750 mg 175 mL/hr over 120 Minutes Intravenous  Once 03/22/24 1022 03/22/24 2137         Subjective: Received a message from the nurse about patient's respite distress, went to see patient at the bedside within 10 minutes.  Patient appeared to be tachypneic, having trouble speaking in full sentences.  Appeared lethargic than yesterday.  Wife and sister-in-law at the bedside.  Patient said he was having shortness of breath.  Despite  of being lethargic he was fully oriented.  Objective: Vitals:   03/25/24 2322 03/26/24 0002 03/26/24 0436 03/26/24 0500  BP: 119/74  97/67   Pulse: 94 92 96   Resp: 20 20 19    Temp: 97.9 F (36.6 C)  98.4 F (36.9 C)   TempSrc: Oral  Oral   SpO2: 99% 99% 98%   Weight:    79.4 kg  Height:        Intake/Output Summary (Last 24 hours) at 03/26/2024 0742 Last data filed at 03/25/2024 2108 Gross per 24 hour  Intake 240 ml  Output --  Net 240 ml   Filed Weights   03/25/24 0500 03/25/24 0530 03/26/24 0500  Weight: 82.8 kg 82.8 kg 79.4 kg    Examination:  General exam: Appears lethargic and tachypneic. Respiratory system: Diminished breath sounds with crackles in the bases.  Tachypneic. Cardiovascular system: S1 & S2 heard, RRR. No JVD, murmurs, rubs, gallops or clicks. No pedal edema. Gastrointestinal system: Abdomen is distended with positive fluid shift, soft and nontender. No organomegaly or masses felt. Normal bowel sounds heard. Central nervous system: Lethargic but oriented. No focal neurological deficits. Extremities: Symmetric 5 x 5 power. Skin: No rashes, lesions or ulcers.    Data Reviewed: I have personally reviewed following labs and imaging studies  CBC: Recent Labs  Lab 03/22/24 0852 03/22/24 0929 03/23/24 0517 03/23/24 0825 03/23/24 1412 03/24/24 0535 03/25/24 0551  WBC 18.6*  --  12.6*  --  12.1* 10.8* 12.7*  NEUTROABS 17.5*  --   --   --   --   --  11.5*  HGB 11.0*   < > 7.8* 7.5* 8.1* 9.2* 9.8*  HCT 35.8*   < > 24.3* 23.7* 25.4* 29.3* 31.2*  MCV 89.9  --  87.1  --  87.9 87.5 86.4  PLT 720*  --  468*  --  473* 503* 496*   < > = values in this interval not displayed.   Basic Metabolic Panel: Recent Labs  Lab 03/22/24 0852 03/22/24 0929 03/23/24 0518 03/24/24 0535 03/25/24 0551  NA 131* 132* 136 137 134*  K 6.4* 6.4* 4.6 3.9 3.7  CL 108 114* 106 103 101  CO2 11*  --  16* 20* 20*  GLUCOSE 149* 143* 102* 98 167*  BUN 162* >130* 108* 63* 73*   CREATININE 3.34* 3.80* 2.44* 1.94* 2.40*  CALCIUM  9.2  --  8.4* 8.3* 8.3*  PHOS  --   --  5.0* 3.3 3.0   GFR: Estimated Creatinine Clearance: 25.7 mL/min (A) (by C-G formula based on SCr of 2.4 mg/dL (H)). Liver Function Tests: Recent Labs  Lab 03/22/24 0852 03/23/24 0517 03/23/24 0518 03/24/24 0535  AST 32 34  --   --   ALT 24 18  --   --   ALKPHOS 142* 97  --   --   BILITOT  0.3 0.6  --   --   PROT 6.5 5.7*  --   --   ALBUMIN  1.7* 2.4* 2.4* 2.2*   Recent Labs  Lab 03/22/24 0852  LIPASE 86*   Recent Labs  Lab 03/22/24 0852 03/25/24 2206  AMMONIA 28 32   Coagulation Profile: Recent Labs  Lab 03/22/24 0852 03/23/24 0517 03/24/24 0921 03/25/24 0855  INR 2.4* 2.8* 2.8* 2.5*   Cardiac Enzymes: No results for input(s): CKTOTAL, CKMB, CKMBINDEX, TROPONINI in the last 168 hours. BNP (last 3 results) No results for input(s): PROBNP in the last 8760 hours. HbA1C: No results for input(s): HGBA1C in the last 72 hours. CBG: Recent Labs  Lab 03/24/24 2358 03/25/24 0528 03/25/24 1732 03/26/24 0140 03/26/24 0530  GLUCAP 183* 168* 190* 183* 141*   Lipid Profile: No results for input(s): CHOL, HDL, LDLCALC, TRIG, CHOLHDL, LDLDIRECT in the last 72 hours. Thyroid Function Tests: No results for input(s): TSH, T4TOTAL, FREET4, T3FREE, THYROIDAB in the last 72 hours. Anemia Panel: Recent Labs    03/24/24 0535  FERRITIN 407*  TIBC 104*  IRON  12*   Sepsis Labs: Recent Labs  Lab 03/22/24 0931  LATICACIDVEN 0.9    Recent Results (from the past 240 hours)  Resp panel by RT-PCR (RSV, Flu A&B, Covid) Anterior Nasal Swab     Status: None   Collection Time: 03/22/24  8:30 AM   Specimen: Anterior Nasal Swab  Result Value Ref Range Status   SARS Coronavirus 2 by RT PCR NEGATIVE NEGATIVE Final   Influenza A by PCR NEGATIVE NEGATIVE Final   Influenza B by PCR NEGATIVE NEGATIVE Final    Comment: (NOTE) The Xpert Xpress  SARS-CoV-2/FLU/RSV plus assay is intended as an aid in the diagnosis of influenza from Nasopharyngeal swab specimens and should not be used as a sole basis for treatment. Nasal washings and aspirates are unacceptable for Xpert Xpress SARS-CoV-2/FLU/RSV testing.  Fact Sheet for Patients: BloggerCourse.com  Fact Sheet for Healthcare Providers: SeriousBroker.it  This test is not yet approved or cleared by the [place]  FDA and has been authorized for detection and/or diagnosis of SARS-CoV-2 by FDA under an Emergency Use Authorization (EUA). This EUA will remain in effect (meaning this test can be used) for the duration of the COVID-19 declaration under Section 564(b)(1) of the Act, 21 U.S.C. section 360bbb-3(b)(1), unless the authorization is terminated or revoked.     Resp Syncytial Virus by PCR NEGATIVE NEGATIVE Final    Comment: (NOTE) Fact Sheet for Patients: BloggerCourse.com  Fact Sheet for Healthcare Providers: SeriousBroker.it  This test is not yet approved or cleared by the [place]  FDA and has been authorized for detection and/or diagnosis of SARS-CoV-2 by FDA under an Emergency Use Authorization (EUA). This EUA will remain in effect (meaning this test can be used) for the duration of the COVID-19 declaration under Section 564(b)(1) of the Act, 21 U.S.C. section 360bbb-3(b)(1), unless the authorization is terminated or revoked.  Performed at [facility], [address] [place], [place] [address]   Blood culture (routine x 2)     Status: None (Preliminary result)   Collection Time: 03/22/24  9:15 AM   Specimen: BLOOD  Result Value Ref Range Status   Specimen Description BLOOD RIGHT ANTECUBITAL  Final   Special Requests   Final    BOTTLES DRAWN AEROBIC AND ANAEROBIC Blood Culture adequate volume   Culture   Final    NO GROWTH 3 DAYS Performed at  [facility],  [address] [place], [place] [address]    Report Status PENDING  Incomplete  Blood culture (routine x 2)     Status: None (Preliminary result)   Collection Time: 03/22/24  9:20 AM   Specimen: BLOOD  Result Value Ref Range Status   Specimen Description BLOOD LEFT ANTECUBITAL  Final   Special Requests   Final    BOTTLES DRAWN AEROBIC AND ANAEROBIC Blood Culture adequate volume   Culture   Final    NO GROWTH 3 DAYS Performed at [facility], [address] [place], [place] [address]    Report Status PENDING  Incomplete  Body fluid culture w Gram Stain     Status: None   Collection Time: 03/22/24 12:25 PM   Specimen: Peritoneal Washings; Peritoneal Fluid  Result Value Ref Range Status   Specimen Description PERITONEAL  Final   Special Requests NONE  Final   Gram Stain RARE WBC SEEN NO ORGANISMS SEEN   Final   Culture   Final    NO GROWTH 3 DAYS Performed at [facility], [address], [place], [place] [address]    Report Status 03/25/2024 FINAL  Final  MRSA Next Gen by PCR, Nasal     Status: None   Collection Time: 03/22/24  7:21 PM   Specimen: Nasal Mucosa; Nasal Swab  Result Value Ref Range Status   MRSA by PCR Next Gen NOT DETECTED NOT DETECTED Final    Comment: (NOTE) The GeneXpert MRSA Assay (FDA approved for NASAL specimens only), is one component of a comprehensive MRSA colonization surveillance program. It is not intended to diagnose MRSA infection nor to guide or monitor treatment for MRSA infections. Test performance is not FDA approved in patients less than 33 years old. Performed at [facility], [address], [place], [place] [address]      Radiology Studies: No results found.   Scheduled Meds:  atorvastatin   40 mg Oral Daily   Chlorhexidine  Gluconate Cloth  6 each Topical Q0600   Chlorhexidine  Gluconate Cloth  6 each Topical Q0600   Chlorhexidine  Gluconate Cloth  6 each Topical Q0600   colesevelam   1,875 mg  Oral BID WC   feeding supplement (NEPRO CARB STEADY)  237 mL Oral BID BM   fluticasone  furoate-vilanterol  1 puff Inhalation Daily   folic acid   1 mg Oral Daily   iron  polysaccharides  150 mg Oral Daily   LORazepam   0.5 mg Intravenous STAT   midodrine   10 mg Oral TID WC   multivitamin  1 tablet Oral QHS   octreotide   100 mcg Subcutaneous Q8H   mouth rinse  15 mL Mouth Rinse 4 times per day   pantoprazole   40 mg Oral BID AC   rifaximin   550 mg Oral BID   sodium chloride  flush  3 mL Intravenous Q12H   thiamine   100 mg Oral Daily   Continuous Infusions:  cefTRIAXone  (ROCEPHIN )  IV Stopped (03/25/24 1900)     LOS: 4 days   Fredia Skeeter, MD [facility]  03/26/2024, 7:42 AM   *Please note that this is a verbal dictation therefore any spelling or grammatical errors are due to the Dragon Medical One system interpretation.  Please page via Amion and do not message via secure chat for urgent patient care matters. Secure chat can be used for non urgent patient care matters.  How to contact the [facility] Attending or Consulting provider 7A - 7P or covering provider during  after hours 7P -7A, for this patient?  Check the care team in [facility] and look for a) attending/consulting [facility] provider listed and b) the [facility] team listed. Page or secure chat 7A-7P. Log into [email or web address] and use [facility]'s universal password to access. If you do not have the password, please contact the hospital operator. Locate the [facility] provider you are looking for under [facility] and page to a number that you can be directly reached. If you still have difficulty reaching the provider, please page the [facility] (Director on Call) for the Hospitalists listed on amion for assistance.

## 2024-03-26 NOTE — Plan of Care (Signed)
   Palliative Medicine Inpatient Follow Up Note  The PMT has reviewed the chart for Darren [name].   IR --> S/P 4.1L fluid removed from paracentesis.   Plan for Dialysis today.  TOC working on placement at [facility] once medically optimized.  The PMT will remain peripheral unless otherwise needed.  No Charge. ______________________________________________________________________________________ Darren [name] Palliative Medicine Team Team Cell Phone: [phone] Please utilize secure chat with additional questions, if there is no response within 30 minutes please call the above phone number  Palliative Medicine Team providers are available by phone from 7am to 7pm daily and can be reached through the team cell phone.  Should this patient require assistance outside of these hours, please call the patient's attending physician.

## 2024-03-26 NOTE — Progress Notes (Signed)
 Pt unable to UF off 500 ml as ordered d/t hypotension. HR high. UF off 200 ml.Dr. Jerrye notified. Meds given during HD : Midodrine  10 mg po. Albumin  25 g IV.  03/26/24 1735  Vitals  Temp 98 F (36.7 C)  Temp Source Oral  BP 108/68  BP Location Right Arm  BP Method Automatic  Patient Position (if appropriate) Lying  ECG Heart Rate (!) 109  Oxygen Therapy  SpO2 100 %  O2 Device Nasal Cannula  O2 Flow Rate (L/min) 6 L/min  During Treatment Monitoring  Intra-Hemodialysis Comments Tx completed  Post Treatment  Dialyzer Clearance Lightly streaked  Hemodialysis Intake (mL) 0 mL  Liters Processed 42  Fluid Removed (mL) 200 mL  Tolerated HD Treatment Yes  Post-Hemodialysis Comments see notes.  Hemodialysis Catheter Right Internal jugular  Placement Date/Time: 03/22/24 (c) 1845   Orientation: Right  Access Location: Internal jugular  Site Condition No complications  Blue Lumen Status Heparin  locked  Red Lumen Status Heparin  locked  Catheter fill solution Heparin  1000 units/ml  Catheter fill volume (Arterial) 1.2 cc  Catheter fill volume (Venous) 1.2  Dressing Type Transparent  Dressing Status Antimicrobial disc/dressing in place  Interventions Dressing reinforced  Drainage Description None  Dressing Change Due 04/02/24  Post treatment catheter status Capped and Clamped

## 2024-03-26 NOTE — Progress Notes (Signed)
 [facility] NEPHROLOGY PROGRESS NOTE  Assessment/ Plan: Pt is a 83 y.o. 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    # Acute kidney injury on CKD IIIa with severe uremic encephalopathy, hyperkalemia and acidosis: Likely due to combination of reduced effective circulatory volume caused by recent paracentesis, use of spironolactone , diuretics and HRS. It seems like the dose of Aldactone  and diuretics increased recently to manage recurrent ascites.  Kidney US  with no hydro. He started dialysis on 9/13 for severe uremic encephalopathy and multiple metabolic abnormalities.  Reportedly he was at his normal mental status until about a week ago.   -------------- - HD today and assess needs daily - Agree with continuing midodrine  and octreotide . - assess diuretic needs daily  - Remain off of Aldactone   - Ordered strict ins/outs, daily weights  - Note that he may not be a good candidate for long-term outpatient dialysis.  Continuing to monitor for renal recovery at this time   # Hyperkalemia due to AKI, Aldactone :  - Improved with HD   # Severe metabolic acidosis with AKI:  - improved with renal replacement therapy as above   # Presumed sepsis, unknown source:  - Blood cultures 9/13 NGTD  - receiving abx per primary team (on ceftriaxone )   # History of liver cirrhosis, ascites, hypoalbuminemia requiring frequent paracentesis.  Seen by GI. on Xifaxan .  # Normocytic Anemia - some component of dilution after receiving fluid in ER.  Per charting, he looked dry and dehydrated on presentation.  - iron  deficiency - started oral iron  and if infection ruled out would give IV iron   - Transfuse as needed per primary team   Disposition - continue  inpatient monitoring     Subjective:  No UOP is documented over 9/16.  He had a paracentesis earlier today with 4.1 liters removed.  Seen and examined on dialysis. Procedure supervised.  Blood pressure 94/67 and HR 108.  We are giving albumin  25 gram IV once (25%).  Right internal jugular nontunneled catheter in use.     Review of systems:      He states that his breathing is better; no chest pain  No n/v   Objective Vital signs in last 24 hours: Vitals:   03/26/24 0834 03/26/24 1219 03/26/24 1356 03/26/24 1414  BP: (!) 89/70 (!) 107/56 (!) 154/90 94/67  Pulse:   (!) 112 (!) 109  Resp:   (!) 23 (!) 25  Temp:  98.5 F (36.9 C) 98 F (36.7 C)   TempSrc: Oral Oral    SpO2:    100%  Weight:   74.4 kg   Height:       Weight change: -3.4 kg  Intake/Output Summary (Last 24 hours) at 03/26/2024 1418 Last data filed at 03/25/2024 2108 Gross per 24 hour  Intake 240 ml  Output --  Net 240 ml       Labs: RENAL PANEL Recent Labs  Lab 03/22/24 0852 03/22/24 0929 03/23/24 0517 03/23/24 0518 03/24/24 0535 03/25/24 0551 03/26/24 0938  NA 131* 132*  --  136 137 134* 136  K 6.4* 6.4*  --  4.6 3.9 3.7 4.4  CL 108 114*  --  106 103 101 100  CO2 11*  --   --  16* 20* 20* 19*  GLUCOSE 149* 143*  --  102* 98 167* 129*  BUN 162* >130*  --  108* 63* 73* 78*  CREATININE 3.34* 3.80*  --  2.44* 1.94* 2.40* 2.81*  CALCIUM  9.2  --   --  8.4* 8.3* 8.3* 8.9  PHOS  --   --   --  5.0* 3.3 3.0  --   ALBUMIN  1.7*  --  2.4* 2.4* 2.2*  --   --     Liver Function Tests: Recent Labs  Lab 03/22/24 0852 03/23/24 0517 03/23/24 0518 03/24/24 0535  AST 32 34  --   --   ALT 24 18  --   --   ALKPHOS 142* 97  --   --   BILITOT 0.3 0.6  --   --   PROT 6.5 5.7*  --   --   ALBUMIN  1.7* 2.4* 2.4* 2.2*   Recent Labs  Lab 03/22/24 0852  LIPASE 86*   Recent Labs  Lab 03/22/24 0852 03/25/24 2206  AMMONIA 28 32   CBC: Recent Labs    03/23/24 0825 03/23/24 1412 03/24/24 0535  03/25/24 0551 03/26/24 0938  HGB 7.5* 8.1* 9.2* 9.8* 10.0*  MCV  --  87.9 87.5 86.4 88.6  FERRITIN  --   --  407*  --   --   TIBC  --   --  104*  --   --   IRON   --   --  12*  --   --     Cardiac Enzymes: No results for input(s): CKTOTAL, CKMB, CKMBINDEX, TROPONINI in the last 168 hours. CBG: Recent Labs  Lab 03/25/24 0528 03/25/24 1732 03/26/24 0140 03/26/24 0530 03/26/24 1222  GLUCAP 168* 190* 183* 141* 149*    Iron  Studies:  Recent Labs    03/24/24 0535  IRON  12*  TIBC 104*  FERRITIN 407*   Studies/Results: IR Paracentesis Result Date: 03/26/2024 INDICATION: 83 year old male. History of cirrhosis with recurrent ascites. Request is for therapeutic paracentesis. EXAM: ULTRASOUND GUIDED THERAPEUTIC LEFT-SIDED PARACENTESIS MEDICATIONS: Lidocaine  1% 10 mL COMPLICATIONS: None immediate. PROCEDURE: Informed written consent was obtained from the patient after a discussion of the risks, benefits and alternatives to treatment. A timeout was performed prior to the initiation of the procedure. Initial ultrasound scanning demonstrates a moderate amount of ascites within the left lower abdominal quadrant. The left lower abdomen was prepped and draped in the usual sterile fashion. 1% lidocaine  was used for local anesthesia. Following this, a 19 gauge, 7-cm, Yueh catheter was introduced. An ultrasound image was saved for documentation purposes. The paracentesis was performed. The catheter was removed and a dressing was applied. The patient tolerated the procedure well without immediate post procedural complication. FINDINGS: A total of approximately 4.1 L of straw-colored fluid was removed. IMPRESSION: Successful ultrasound-guided therapeutic paracentesis yielding 4.1 L liters of peritoneal fluid. Performed by Delon Beagle NP PLAN: The patient has required >/=2 paracenteses in a 30 day period and a formal evaluation by the [facility] Interventional Radiology Portal Hypertension  Clinic has been arranged. Thom Hall, MD [facility] Radiology Electronically Signed   By: Thom Hall M.D.   On: 03/26/2024 13:02     Medications: Infusions:  albumin  human     cefTRIAXone  (ROCEPHIN )  IV 2 g (03/26/24 1230)    Scheduled Medications:  atorvastatin   40 mg Oral Daily   Chlorhexidine  Gluconate Cloth  6 each Topical Q0600   Chlorhexidine  Gluconate Cloth  6 each Topical Q0600   Chlorhexidine  Gluconate  Cloth  6 each Topical Q0600   colesevelam   1,875 mg Oral BID WC   feeding supplement (NEPRO CARB STEADY)  237 mL Oral BID BM   fluticasone  furoate-vilanterol  1 puff Inhalation Daily   folic acid   1 mg Oral Daily   iron  polysaccharides  150 mg Oral Daily   LORazepam   0.5 mg Intravenous STAT   midodrine   10 mg Oral TID WC   multivitamin  1 tablet Oral QHS   octreotide   100 mcg Subcutaneous Q8H   mouth rinse  15 mL Mouth Rinse 4 times per day   pantoprazole   40 mg Oral BID AC   rifaximin   550 mg Oral BID   sodium chloride  flush  3 mL Intravenous Q12H   thiamine   100 mg Oral Daily    have reviewed scheduled and prn medications.  Physical Exam:     General elderly male in bed in no acute distress, chronically ill-appearing  HEENT normocephalic atraumatic extraocular movements intact sclera anicteric Neck supple trachea midline Lungs clear to auscultation bilaterally normal work of breathing at rest but on 6 liters oxygen; has wet-sounding cough  Heart regular rate and rhythm no rubs or gallops appreciated Abdomen soft distended and non-tender Extremities no pitting edema  Psych normal mood and affect Neuro alert and conversant Access RIJ nontunneled dialysis catheter      Katheryn JAYSON Saba 03/26/2024,2:32 PM  LOS: 4 days

## 2024-03-26 NOTE — Progress Notes (Signed)
 PT placed on CPAP at this time.   03/26/24 2232  BiPAP/CPAP/SIPAP  BiPAP/CPAP/SIPAP Pt Type Adult  BiPAP/CPAP/SIPAP Resmed  Reason BIPAP/CPAP not in use Other(comment)  Mask Type Full face mask  Dentures removed? Not applicable  Mask Size Large  Respiratory Rate 18 breaths/min  FiO2 (%) 21 %  Flow Rate 1 lpm  Minute Ventilation 8.1  Leak 28  Tidal Volume (Vt) 501  Patient Home Machine No  Patient Home Mask No  Patient Home Tubing No  Auto Titrate Yes  Minimum cmH2O 5 cmH2O  Maximum cmH2O 10 cmH2O  Nasal massage performed Yes  CPAP/SIPAP surface wiped down Yes  Device Plugged into RED Power Outlet Yes

## 2024-03-26 NOTE — Progress Notes (Signed)
 PT Cancellation Note  Patient Details Name: CONLAN MICELI MRN: [id number] DOB: [birth year]   Cancelled Treatment:    Reason Eval/Treat Not Completed: Medical issues which prohibited therapy, pt HR up to 140bpm at rest in bed, and RN reports recently increasing O2 to 8L due to sats in 60%. Will hold at this time but continue to follow and progress as able.   Izetta Call, PT, DPT   Acute Rehabilitation Department Office [phone] Secure Chat Communication Preferred    Izetta JULIANNA Call 03/26/2024, 10:58 AM

## 2024-03-27 DIAGNOSIS — N179 Acute kidney failure, unspecified: Secondary | ICD-10-CM | POA: Diagnosis not present

## 2024-03-27 DIAGNOSIS — N189 Chronic kidney disease, unspecified: Secondary | ICD-10-CM | POA: Diagnosis not present

## 2024-03-27 LAB — CULTURE, BLOOD (ROUTINE X 2)
Culture: NO GROWTH
Culture: NO GROWTH
Special Requests: ADEQUATE
Special Requests: ADEQUATE

## 2024-03-27 LAB — CBC WITH DIFFERENTIAL/PLATELET
Abs Immature Granulocytes: 0.14 K/uL — ABNORMAL HIGH (ref 0.00–0.07)
Basophils Absolute: 0 K/uL (ref 0.0–0.1)
Basophils Relative: 0 %
Eosinophils Absolute: 0.1 K/uL (ref 0.0–0.5)
Eosinophils Relative: 1 %
HCT: 30.4 % — ABNORMAL LOW (ref 39.0–52.0)
Hemoglobin: 9.5 g/dL — ABNORMAL LOW (ref 13.0–17.0)
Immature Granulocytes: 1 %
Lymphocytes Relative: 4 %
Lymphs Abs: 0.4 K/uL — ABNORMAL LOW (ref 0.7–4.0)
MCH: 27.9 pg (ref 26.0–34.0)
MCHC: 31.3 g/dL (ref 30.0–36.0)
MCV: 89.1 fL (ref 80.0–100.0)
Monocytes Absolute: 0.8 K/uL (ref 0.1–1.0)
Monocytes Relative: 8 %
Neutro Abs: 8.9 K/uL — ABNORMAL HIGH (ref 1.7–7.7)
Neutrophils Relative %: 86 %
Platelets: 379 K/uL (ref 150–400)
RBC: 3.41 MIL/uL — ABNORMAL LOW (ref 4.22–5.81)
RDW: 17.4 % — ABNORMAL HIGH (ref 11.5–15.5)
WBC: 10.3 K/uL (ref 4.0–10.5)
nRBC: 0 % (ref 0.0–0.2)

## 2024-03-27 LAB — COMPREHENSIVE METABOLIC PANEL WITH GFR
ALT: 16 U/L (ref 0–44)
AST: 28 U/L (ref 15–41)
Albumin: 2.1 g/dL — ABNORMAL LOW (ref 3.5–5.0)
Alkaline Phosphatase: 123 U/L (ref 38–126)
Anion gap: 17 — ABNORMAL HIGH (ref 5–15)
BUN: 43 mg/dL — ABNORMAL HIGH (ref 8–23)
CO2: 23 mmol/L (ref 22–32)
Calcium: 8.3 mg/dL — ABNORMAL LOW (ref 8.9–10.3)
Chloride: 97 mmol/L — ABNORMAL LOW (ref 98–111)
Creatinine, Ser: 2.15 mg/dL — ABNORMAL HIGH (ref 0.61–1.24)
GFR, Estimated: 30 mL/min — ABNORMAL LOW (ref >60)
Glucose, Bld: 91 mg/dL (ref 70–99)
Potassium: 4 mmol/L (ref 3.5–5.1)
Sodium: 137 mmol/L (ref 135–145)
Total Bilirubin: 0.7 mg/dL (ref 0.0–1.2)
Total Protein: 5.4 g/dL — ABNORMAL LOW (ref 6.5–8.1)

## 2024-03-27 LAB — GLUCOSE, CAPILLARY
Glucose-Capillary: 92 mg/dL (ref 70–99)
Glucose-Capillary: 190 mg/dL — ABNORMAL HIGH (ref 70–99)
Glucose-Capillary: 189 mg/dL — ABNORMAL HIGH (ref 70–99)

## 2024-03-27 MED ORDER — FUROSEMIDE 10 MG/ML IJ SOLN
80.0000 mg | Freq: Once | INTRAMUSCULAR | Status: AC
  Administered 2024-03-27: 80 mg via INTRAVENOUS
  Filled 2024-03-27: qty 8

## 2024-03-27 NOTE — Progress Notes (Signed)
 [facility] NEPHROLOGY PROGRESS NOTE  Assessment/ Plan: Pt is a 83 y.o. 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    # Acute kidney injury on CKD IIIa with severe uremic encephalopathy, hyperkalemia and acidosis: Likely due to combination of reduced effective circulatory volume caused by recent paracentesis, use of spironolactone , diuretics and HRS. It seems like the dose of Aldactone  and diuretics increased recently to manage recurrent ascites.  Kidney US  with no hydro. He started dialysis on 9/13 for severe uremic encephalopathy and multiple metabolic abnormalities.  Reportedly he was at his normal mental status until about a week ago.   -------------- - Assess dialysis needs daily - lasix  80 mg IV once  - Agree with continuing midodrine  and octreotide . - assess diuretic needs daily  - Remain off of Aldactone   - Ordered strict ins/outs, daily weights  - Note that he may not be a good candidate for long-term outpatient dialysis.  Continuing to monitor for renal recovery at this time   # Hyperkalemia due to AKI, Aldactone :  - Improved with HD   # Severe metabolic acidosis with AKI:  - improved with renal replacement therapy as above   # Presumed sepsis, unknown source:  - Blood cultures 9/13 negative  - receiving abx per primary team (on ceftriaxone )   # History of liver cirrhosis, ascites, hypoalbuminemia requiring frequent paracentesis.  Seen by GI. on Xifaxan .  # Normocytic Anemia - some component of dilution after receiving fluid in ER.  Per charting, he looked dry and dehydrated on presentation.  - iron  deficiency - started oral iron  and if infection ruled out would give IV iron   - Transfuse as needed per primary team  -  Would initiate aranesp if drops further   Disposition - continue inpatient monitoring     Subjective:  He had 500 mL UOP over 9/17.  Last HD on 9/17 with 200 mL UF removed - he didn't tolerate the goal of 0.5 kg - 1 liter due to hypotension.  A close friend of the family is at bedside and he is having a cognition evaluation with speech therapist.  He is worried about lasix  making him urinate out muscle mass and I let him know that this would not happen.   Review of systems:      He states that his breathing is better; no chest pain  No n/v Appetite is a little better but food tastes like mud   Objective Vital signs in last 24 hours: Vitals:   03/26/24 2320 03/27/24 0419 03/27/24 0700 03/27/24 1122  BP: (!) 86/62 90/61 96/70  92/81  Pulse: 87 84 96 84  Resp: 19 17  19   Temp:  97.8 F (36.6 C) (!) 97.5 F (36.4 C) 97.9 F (36.6 C)  TempSrc:  Axillary Oral Oral  SpO2: 97% 96% 95% 95%  Weight:  71.9 kg    Height:       Weight change: -5 kg  Intake/Output Summary (Last 24 hours) at 03/27/2024 1330 Last data filed at 03/27/2024 0448 Gross per 24 hour  Intake 0 ml  Output 700 ml  Net -700 ml       Labs: RENAL PANEL Recent Labs  Lab 03/22/24 9147 03/22/24 9070 03/23/24 0517 03/23/24 0518 03/24/24 0535 03/25/24 0551 03/26/24 9061 03/27/24 0425  NA 131*   < >  --  136 137 134* 136 137  K 6.4*   < >  --  4.6 3.9 3.7 4.4 4.0  CL 108   < >  --  106 103 101 100 97*  CO2 11*  --   --  16* 20* 20* 19* 23  GLUCOSE 149*   < >  --  102* 98 167* 129* 91  BUN 162*   < >  --  108* 63* 73* 78* 43*  CREATININE 3.34*   < >  --  2.44* 1.94* 2.40* 2.81* 2.15*  CALCIUM  9.2  --   --  8.4* 8.3* 8.3* 8.9 8.3*  PHOS  --   --   --  5.0* 3.3 3.0  --   --   ALBUMIN  1.7*  --  2.4* 2.4* 2.2*  --   --  2.1*   < > = values in this interval not displayed.    Liver Function Tests: Recent Labs  Lab 03/22/24 0852 03/23/24 0517 03/23/24 0518 03/24/24 0535 03/27/24 0425  AST 32 34  --    --  28  ALT 24 18  --   --  16  ALKPHOS 142* 97  --   --  123  BILITOT 0.3 0.6  --   --  0.7  PROT 6.5 5.7*  --   --  5.4*  ALBUMIN  1.7* 2.4* 2.4* 2.2* 2.1*   Recent Labs  Lab 03/22/24 0852  LIPASE 86*   Recent Labs  Lab 03/22/24 0852 03/25/24 2206  AMMONIA 28 32   CBC: Recent Labs    03/23/24 1412 03/24/24 0535 03/25/24 0551 03/26/24 0938 03/27/24 0425  HGB 8.1* 9.2* 9.8* 10.0* 9.5*  MCV 87.9 87.5 86.4 88.6 89.1  FERRITIN  --  407*  --   --   --   TIBC  --  104*  --   --   --   IRON   --  12*  --   --   --     Cardiac Enzymes: No results for input(s): CKTOTAL, CKMB, CKMBINDEX, TROPONINI in the last 168 hours. CBG: Recent Labs  Lab 03/26/24 0140 03/26/24 0530 03/26/24 1222 03/26/24 2351 03/27/24 0616  GLUCAP 183* 141* 149* 87 92    Iron  Studies:  No results for input(s): IRON , TIBC, TRANSFERRIN, FERRITIN in the last 72 hours.   Studies/Results: IR Paracentesis Result Date: 03/26/2024 INDICATION: 83 year old male. History of cirrhosis with recurrent ascites. Request is for therapeutic paracentesis. EXAM: ULTRASOUND GUIDED THERAPEUTIC LEFT-SIDED PARACENTESIS MEDICATIONS: Lidocaine  1% 10 mL COMPLICATIONS: None immediate. PROCEDURE: Informed written consent was obtained from the patient after a discussion of the risks, benefits and alternatives to treatment. A timeout was performed prior to the initiation of the procedure. Initial ultrasound scanning demonstrates a moderate amount of ascites within the left lower abdominal quadrant. The left lower abdomen was prepped and draped in the usual sterile fashion. 1% lidocaine  was used for local anesthesia. Following this, a 19 gauge, 7-cm, Yueh catheter was introduced. An ultrasound image was saved for documentation purposes. The paracentesis was performed. The catheter was removed and a dressing was applied. The patient tolerated the procedure well without immediate post procedural complication. FINDINGS: A  total of approximately 4.1 L of straw-colored fluid was removed. IMPRESSION: Successful ultrasound-guided therapeutic paracentesis yielding 4.1 L liters of peritoneal fluid. Performed by Delon Beagle NP PLAN: The patient has required >/=2 paracenteses in a 30 day period and a formal evaluation by the  [facility] Interventional Radiology Portal Hypertension Clinic has been arranged. Thom Hall, MD [facility] Radiology Electronically Signed   By: Thom Hall M.D.   On: 03/26/2024 13:02     Medications: Infusions:  cefTRIAXone  (ROCEPHIN )  IV 2 g (03/27/24 1152)    Scheduled Medications:  atorvastatin   40 mg Oral Daily   Chlorhexidine  Gluconate Cloth  6 each Topical Q0600   colesevelam   1,875 mg Oral BID WC   feeding supplement (NEPRO CARB STEADY)  237 mL Oral BID BM   fluticasone  furoate-vilanterol  1 puff Inhalation Daily   folic acid   1 mg Oral Daily   iron  polysaccharides  150 mg Oral Daily   midodrine   10 mg Oral TID WC   multivitamin  1 tablet Oral QHS   octreotide   100 mcg Subcutaneous Q8H   mouth rinse  15 mL Mouth Rinse 4 times per day   pantoprazole   40 mg Oral BID AC   rifaximin   550 mg Oral BID   sodium chloride  flush  3 mL Intravenous Q12H   thiamine   100 mg Oral Daily    have reviewed scheduled and prn medications.  Physical Exam:      General elderly male in bed in no acute distress, chronically ill-appearing  HEENT normocephalic atraumatic extraocular movements intact sclera anicteric Neck supple trachea midline Lungs clear to auscultation bilaterally normal work of breathing at rest on room air; occ wet cough  Heart S1S2 no rub Abdomen soft distended and non-tender Extremities no pitting edema  Psych normal mood and affect Neuro alert and conversant Access RIJ nontunneled dialysis catheter      Katheryn JAYSON Saba 03/27/2024,1:49 PM  LOS: 5 days

## 2024-03-27 NOTE — Evaluation (Signed)
 Speech Language Pathology Evaluation Patient Details Name: Darren [name] MRN: [id number] DOB: [birth year] Today's Date: 03/27/2024 Time: 8669-8642 SLP Time Calculation (min) (ACUTE ONLY): 27 min  Problem List:  Patient Active Problem List   Diagnosis Date Noted   Protein-calorie malnutrition, severe 03/26/2024   Palliative care by specialist 03/26/2024   Pressure injury of skin 03/25/2024   Iron  deficiency anemia due to chronic blood loss 03/22/2024   Cirrhosis (HCC) 03/22/2024   Acute renal failure superimposed on chronic kidney disease (HCC) 03/22/2024   SIRS (systemic inflammatory response syndrome) (HCC) 03/22/2024   Chronic anticoagulation 01/23/2023   ETOH abuse 06/16/2022   Chronic diastolic CHF (congestive heart failure) (HCC) 06/07/2022   Obstructive sleep apnea 09/11/2018   Shortness of breath 09/11/2018   bilateral leg edema 09/11/2018   Paroxysmal atrial fibrillation (HCC) 09/11/2018   Laboratory examination 09/11/2018   Essential hypertension 07/25/2018   Rhinitis, chronic 01/21/2018   GI bleed 03/07/2017   Cough variant asthma with ? component of UACS 03/30/2016   COPD  GOLD III 03/30/2016   Morbid obesity due to excess calories (HCC) 03/30/2016   Past Medical History:  Past Medical History:  Diagnosis Date   Acute respiratory failure with hypoxia (HCC) 06/16/2022   Arthritis    maybe a little   Asthma    mild   Atrial fibrillation (HCC)    Atypical mole 08/16/2011   left upper back tx exc atypical prol.   Atypical mole 12/18/2012   solar lentigo atypical tx w/s   bilateral leg edema 09/11/2018   Cancer (HCC)    prostate cancer   CHF (congestive heart failure) (HCC)    resolved with low salt diet   COPD with acute exacerbation (HCC) 06/16/2022   Dyspnea    Dyspnea on exertion 09/11/2018   Dysrhythmia    Afib with rapid ventricular   Essential hypertension 07/25/2018   Fatty liver    GERD (gastroesophageal reflux disease)    GI bleed 2018    Gout    History of blood transfusion 2018   Obstructive sleep apnea 09/11/2018   OSA (obstructive sleep apnea)    Paroxysmal atrial fibrillation (HCC) 09/11/2018   Pre-diabetes    Past Surgical History:  Past Surgical History:  Procedure Laterality Date   CARDIOVERSION N/A 10/08/2018   Procedure: CARDIOVERSION;  Surgeon: Ladona Heinz, MD;  Location: [facility] ENDOSCOPY;  Service: Cardiovascular;  Laterality: N/A;   CARDIOVERSION N/A 01/17/2019   Procedure: CARDIOVERSION;  Surgeon: Ladona Heinz, MD;  Location: [facility] ENDOSCOPY;  Service: Cardiovascular;  Laterality: N/A;   COLONOSCOPY Left 03/10/2017   Procedure: COLONOSCOPY;  Surgeon: Burnette Fallow, MD;  Location: [facility] ENDOSCOPY;  Service: Endoscopy;  Laterality: Left;   ESOPHAGOGASTRODUODENOSCOPY (EGD) WITH PROPOFOL  Left 03/09/2017   Procedure: ESOPHAGOGASTRODUODENOSCOPY (EGD) WITH PROPOFOL ;  Surgeon: Saintclair Jasper, MD;  Location: [facility] ENDOSCOPY;  Service: Gastroenterology;  Laterality: Left;   EXCISION MASS HEAD Right 03/21/2023   Procedure: EXCISION OF NOSE MOHS DEFECT WITH REPAIR;  Surgeon: Luciano Standing, MD;  Location: [facility] OR;  Service: ENT;  Laterality: Right;   EYE SURGERY Bilateral    cataract    FINGER SURGERY Right    5th   IR PARACENTESIS  12/18/2023   IR PARACENTESIS  01/08/2024   IR PARACENTESIS  02/11/2024   IR PARACENTESIS  03/13/2024   IR PARACENTESIS  03/26/2024   Prostate     DaVinci   SKIN FULL THICKNESS GRAFT Right 03/21/2023   Procedure: ADJACENT TISSUE TRANSFER;  Surgeon: Luciano Standing, MD;  Location:  [facility] OR;  Service: ENT;  Laterality: Right;   HPI:  Darren [name] is a 83 y.o. male with medical history significant of hypertension, CKD, atrial fibrillation, anemia, diastolic CHF, OSA, obesity, COPD, cirrhosis with ascites presented with worsening mental status.  History obtained with assistance of chart review and family.  Patient has had 5 days of worsening cognitive functioning and now has difficulty ambulating with significant confusion.   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  Has history of cirrhosis and has been getting paracentesis.  Last paracentesis was a week ago on 9/4 and had 5.3 L removed.  Did see a nephrologist yesterday per chart review and there was concern about his worsening renal function and he was started on increased dose of spironolactone  with increased frequency of Lasix .     Continue to have worsening status and so present to the ED on 03/22/24. ST consulted for speech/language assessment.  Assessment / Plan / Recommendation Clinical Impression  Pt seen for a speech/language assessment with [employer] Mental Status Examination (SLUMS) with a total score obtained of 23/30 with a typical score on this assessment being 2730.  Pt exhibited deficits in the areas of sustained attention and decreased short-term memory, recall of new information and retrieval deficits with tasks such as object recall after a time constraint (60%), but achieved 100% with category cue by SLP.  Clock formation may have been impacted by spatial organization.  Pt exhibited decreased sustained attention with digit recall backwards past 2 digits.  Family friend stated he is responding slower than usual.  Speech intelligibility impacteddecreased (50-75% accurate) d/t dyspnea past phrase level as vocal quality noted to be hoarse/low vocal intensity.  Auditory comprehension appeared adequate for simple tasks.  No verbal expression deficits noted.  ST will f/u for cognitive tx briefly within acute setting to address above stated deficits.  Thank you for this consult.    SLP Assessment  SLP Recommendation/Assessment: Patient would benefit from further speech/language tx SLP Visit Diagnosis: Cognitive communication deficit (R41.841)     Assistance Recommended at Discharge  Other (comment) (TBD)  Functional Status Assessment Patient has had a recent decline in their functional status and  demonstrates the ability to make significant improvements in function in a reasonable and predictable amount of time.  Frequency and Duration Other (Comment) (1x/wk)         SLP Evaluation Cognition  Overall Cognitive Status: Within Functional Limits for tasks assessed Arousal/Alertness: Awake/alert Orientation Level: Oriented X4 Attention: Sustained Sustained Attention: Appears intact Memory: Impaired Memory Impairment: Decreased short term memory;Decreased recall of new information;Retrieval deficit Decreased Short Term Memory: Verbal basic;Functional basic Awareness: Appears intact Problem Solving: Appears intact Safety/Judgment: Appears intact       Comprehension  Auditory Comprehension Overall Auditory Comprehension: Appears within functional limits for tasks assessed Conversation: Simple Interfering Components: Attention (baseline ADD per report) EffectiveTechniques: Repetition Visual Recognition/Discrimination Discrimination: Not tested Reading Comprehension Reading Status: Not tested    Expression Expression Primary Mode of Expression: Verbal Verbal Expression Overall Verbal Expression: Appears within functional limits for tasks assessed Level of Generative/Spontaneous Verbalization: Conversation Naming: No impairment Pragmatics: No impairment Interfering Components: Attention Non-Verbal Means of Communication: Not applicable Written Expression Dominant Hand: Right Written Expression: Exceptions to [facility] Interfering Components: Other (comment);Attention (no glasses available)   Oral / Motor  Oral Motor/Sensory Function Overall Oral Motor/Sensory Function: Within functional limits Motor Speech Overall Motor Speech: Appears within functional limits for tasks assessed Respiration:  Impaired Level of Impairment: Phrase Phonation: Hoarse;Low vocal intensity Resonance: Within functional limits Articulation: Impaired Level of Impairment: Sentence Intelligibility:  Intelligibility reduced Word: 50-74% accurate Phrase: 50-74% accurate Sentence: 50-74% accurate Conversation: 50-74% accurate Motor Planning: Within functional limits Motor Speech Errors: Not applicable Effective Techniques: Slow rate;Increased vocal intensity;Pause            Pat Raegen Tarpley,M.S.,CCC-SLP 03/27/2024, 2:08 PM

## 2024-03-27 NOTE — Plan of Care (Signed)
  Problem: Clinical Measurements: Goal: Respiratory complications will improve Outcome: Progressing Goal: Cardiovascular complication will be avoided Outcome: Progressing   Problem: Coping: Goal: Level of anxiety will decrease Outcome: Progressing   Problem: Elimination: Goal: Will not experience complications related to urinary retention Outcome: Progressing   Problem: Safety: Goal: Ability to remain free from injury will improve Outcome: Progressing   Problem: Cardiac: Goal: Ability to achieve and maintain adequate cardiopulmonary perfusion will improve Outcome: Progressing   Problem: Cardiac: Goal: Ability to achieve and maintain adequate cardiopulmonary perfusion will improve Outcome: Progressing

## 2024-03-27 NOTE — Progress Notes (Signed)
 Physical Therapy Treatment Patient Details Name: Darren [name] MRN: [id number] DOB: May 30, 1941 Today's Date: 03/27/2024   History of Present Illness Pt is an 83 yo male who presented 03/22/24 due to AMS. Pt admitted due to severe AKI and sepsis. HD initiated 9/13 due to uremic encephalopathy and multiple metabolic abnormalities. 9/17 s/p therapeutic paracentesis PMH includes: HTN, CKD, afib, anemia, diastolic CHF, OSA, obesity, COPD, liver cirrhosis with ascites for which he gets frequent paracentesis (last on 9/4 with 5.3L removed).    PT Comments  Pt has started making slow, but decent gains toward progress.  Emphasis on  STS x5 with varying levels of mod assist including pre gait activity and transfer via step pivot to the recliner with mod assist given face to face.  Pt stood and scooted back x2   If plan is discharge home, recommend the following: Two people to help with walking and/or transfers;A lot of help with bathing/dressing/bathroom;Assistance with cooking/housework;Direct supervision/assist for medications management;Direct supervision/assist for financial management;Assist for transportation;Help with stairs or ramp for entrance;Supervision due to cognitive status   Can travel by private vehicle     No  Equipment Recommendations  BSC/3in1;Wheelchair (measurements PT);Wheelchair cushion (measurements PT)    Recommendations for Other Services       Precautions / Restrictions Precautions Precautions: Fall Recall of Precautions/Restrictions: Impaired Precaution/Restrictions Comments: orthostatic     Mobility  Bed Mobility Overal bed mobility: Needs Assistance Bed Mobility: Sidelying to Sit, Sit to Supine   Sidelying to sit: Mod assist   Sit to supine: Mod assist   General bed mobility comments: cues for best technique, rolled with min and mod from transitions from R elbow to sitting.x2    Transfers Overall transfer level: Needs assistance   Transfers: Sit to/from  Stand Sit to Stand: Min assist, Mod assist   Step pivot transfers: Mod assist       General transfer comment: STS with face to face assist x5 and face to face step pivot to the recliner.  cue for use of UE's appropriately and pt helped with boost and forward.    Ambulation/Gait             Pre-gait activities: 3 of 5 standing trials, pt worked on prolonged standing, w/shift--unweighting     Comptroller Bed    Modified Rankin (Stroke Patients Only)       Balance   Sitting-balance support: Feet supported, Bilateral upper extremity supported Sitting balance-Leahy Scale: Fair     Standing balance support: Bilateral upper extremity supported Standing balance-Leahy Scale: Poor                              Communication Communication Communication: No apparent difficulties  Cognition Arousal: Alert Behavior During Therapy: WFL for tasks assessed/performed                             Following commands: Impaired Following commands impaired: Follows multi-step commands inconsistently, Follows one step commands with increased time    Cueing Cueing Techniques: Verbal cues  Exercises Other Exercises Other Exercises: hip/knee flex/ext ROM exercise for warm up with graded resistance x10 reps bil Other Exercises: bicep/tricep presses with graded resistance x 10 reps bil.    General Comments General comments (skin integrity, edema, etc.): BP's inital in supine 70's/50's,  but after exercise, sitting EOB BP's 105/75, then 110/78      Pertinent Vitals/Pain Pain Assessment Pain Assessment: No/denies pain    Home Living     Available Help at Discharge: Family Type of Home: House                  Prior Function            PT Goals (current goals can now be found in the care plan section) Acute Rehab PT Goals PT Goal Formulation: With patient/family Time For Goal Achievement:  04/07/24 Potential to Achieve Goals: Good Progress towards PT goals: Progressing toward goals    Frequency    Min 2X/week      PT Plan      Co-evaluation              AM-PAC PT 6 Clicks Mobility   Outcome Measure  Help needed turning from your back to your side while in a flat bed without using bedrails?: A Lot Help needed moving from lying on your back to sitting on the side of a flat bed without using bedrails?: A Lot Help needed moving to and from a bed to a chair (including a wheelchair)?: A Lot Help needed standing up from a chair using your arms (e.g., wheelchair or bedside chair)?: A Lot Help needed to walk in hospital room?: Total Help needed climbing 3-5 steps with a railing? : Total 6 Click Score: 10    End of Session   Activity Tolerance: Patient limited by fatigue Patient left: in chair;with call bell/phone within reach;with chair alarm set;with family/visitor present Nurse Communication: Mobility status PT Visit Diagnosis: Unsteadiness on feet (R26.81);Other abnormalities of gait and mobility (R26.89);Muscle weakness (generalized) (M62.81)     Time: 8445-8373 PT Time Calculation (min) (ACUTE ONLY): 32 min  Charges:    $Therapeutic Exercise: 8-22 mins $Therapeutic Activity: 8-22 mins PT General Charges $$ ACUTE PT VISIT: 1 Visit                     03/27/2024  [place] HERO., PT Acute Rehabilitation Services [phone]  (office)   Darren [name] 03/27/2024, 5:19 PM

## 2024-03-27 NOTE — TOC Progression Note (Signed)
 Transition of Care [facility]) - Progression Note    Patient Details  Name: Darren [name] MRN: [id number] Date of Birth: October 11, 1940  [facility] CM/SW Contact  Isaiah Public, LCSWA Phone Number: 03/27/2024, 11:11 AM  Clinical Narrative:     Patient has SNF bed at [facility] when medically ready. CSW will continue to follow.  Expected Discharge Plan: Skilled Nursing Facility Barriers to Discharge: Continued Medical Work up               Expected Discharge Plan and Services In-house Referral: Clinical Social Work     Living arrangements for the past 2 months: Single Family Home                                       Social Drivers of Health (SDOH) Interventions SDOH Screenings   Food Insecurity: No Food Insecurity (03/22/2024)  Housing: Low Risk  (03/22/2024)  Transportation Needs: No Transportation Needs (03/22/2024)  Utilities: Not At Risk (03/22/2024)  Social Connections: Patient Unable To Answer (03/22/2024)  Tobacco Use: Medium Risk (03/23/2024)    Readmission Risk Interventions     No data to display

## 2024-03-27 NOTE — Progress Notes (Signed)
 PROGRESS NOTE    Darren [name]  FMW:[id number] DOB: [birth year] DOA: 03/22/2024 PCP: [name], MD   Brief Narrative:  Darren [name] is a 83 y.o. male with medical history significant of hypertension, CKD, atrial fibrillation, anemia, diastolic CHF, OSA, obesity, COPD, cirrhosis with ascites presented with worsening mental status for 5 days prior to coming followed by difficulty ambulating. 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 Has history of cirrhosis and has been getting paracentesis.  Last paracentesis was a week ago on 9/4 and had 5.3 L removed.  Did see a nephrologist a day prior, per chart review and there was concern about his worsening renal function and he was started on increased dose of spironolactone  with increased frequency of Lasix .  Presented to ED due to worsening symptoms.    Upon arrival to ED, hemodynamically stable. Lab workup included CMP with sodium 131, potassium 6.4, bicarb 11, BUN 162, creatinine elevated 3.34 from baseline of 1.4, glucose 149, albumin  1.7, alk phos 142.  CBC with leukocytosis to 18.6, hemoglobin near baseline 11, platelets 720.  PT and INR elevated at 27.7 and 2.4 respectively.  Lactic acid normal with repeat pending.  Lipase 86.  Respiratory panel for flu COVID RSV negative. Ammonia level normal.  Chest x-ray showed no acute abnormality. CT head showed no acute abnormality.  Patient received vancomycin , ceftriaxone , Flagyl  in the ED.  Also received midodrine , octreotide , albumin .  Received 1.5 L IV fluid.  For hyperkalemia received bicarb, Lokelma , calcium  gluconate, insulin  and dextrose . Critical care was consulted and evaluated patient, placed temporary dialysis catheter and did diagnostic paracentesis.  Patient was seen by nephrology, underwent hemodialysis same day.  Details below.  Assessment & Plan:   Principal Problem:   Acute renal failure superimposed on chronic kidney disease (HCC) Active  Problems:   COPD  GOLD III   Morbid obesity due to excess calories (HCC)   Essential hypertension   Obstructive sleep apnea   Paroxysmal atrial fibrillation (HCC)   Chronic diastolic CHF (congestive heart failure) (HCC)   Cirrhosis (HCC)   SIRS (systemic inflammatory response syndrome) (HCC)   Pressure injury of skin   Protein-calorie malnutrition, severe   Palliative care by specialist  Acute uremic encephalopathy/AKI on CKD stage IIIa/hyperkalemia/metabolic acidosis/hyponatremia, POA: Baseline creatinine around 1.3-1.4 in September 2024.  Presented with creatinine of 334 which peaked at 3.8 along with progressive decline in mental status for past 5 days now with decreased ability to ambulate and significant confusion.  Likely HRS. Reportedly he is very cognitively sharp at baseline.  Patient received [facility] by critical care team, seen by nephrology, underwent urgent dialysis on 03/22/2024 for severe uremic encephalopathy.  Hyponatremia and hyperkalemia both improved.  Creatinine improved somewhat but then got worse, patient received third session of hemodialysis on 03/26/2024.  Dialysis per nephrology.  Acute hypoxic respiratory failure secondary to fluid overload: Patient developed crackles and he was tachypneic on the morning of 03/26/2024.  Nephrology was informed, urgent dialysis was arranged.  Also IR was consulted, therapeutic paracentesis was completed with removal of 4.1 L fluid.  He feels much better without any shortness of breath and he is on room air now.  Patient with decompensated liver cirrhosis here with severe sepsis due to possible SBP, POA: SIRS Met severe sepsis criteria based on tachycardia, leukocytosis and encephalopathy as well as AKI on CKD.  Underwent diagnostic paracentesis by critical care team, patient started on Flagyl , Rocephin  and vancomycin  fluid analysis indicative of  SBP so Flagyl  and vancomycin  discontinued 03/23/2024.  Patient's INR 2.5 on 03/23/2024, Eliquis   discontinued 03/24/2024 per GI recommendations.  Continue midodrine , octreotide  and PPI.  Patient appeared to have worsening ascites but no tenderness and he was in respiratory distress, IR was consulted, paracentesis with yield of 4.1 L completed on 03/26/2024.  He appears more comfortable today.   Hypertension - Holding antihypertensives in the setting of low blood pressure and initiation of dialysis.  He was in fact hypotensive and was feeling weak.  I wonder if he should be started on midodrine .   Atrial fibrillation - Holding metoprolol  with low blood pressure.  Rates controlled.  Discontinued Eliquis  as mentioned above.   Acute normocytic anemia, POA: Hemoglobin was around 11 about a year ago, presented with 11.9 yesterday and down to 7.5 on 03/23/2024 however it improved to 9.2 on 03/24/2024 without any transfusion..  No reports of hematochezia, hematemesis or melena.    Diastolic CHF > Echo in 2022 with EF 50-55%, G2 DD, moderate pulmonary regurgitation. - Holding diuretics in the setting of dehydration/AKI/initiation of HD.   OSA - Continue home CPAP   COPD - Replaced home Dulera  with formulary Breo  Diarrhea: Few days ago, nurses documented type VII stool, system prompted C. difficile checking, ordered C. difficile and precautions as well.  No further diarrhea.  Discontinued boat C. difficile and enteric precautions.   DVT prophylaxis:    Code Status: Limited: Do not attempt resuscitation (DNR) -DNR-LIMITED -Do Not Intubate/DNI   Family Communication: Wife and daughter present at bedside.  Plan of care discussed.  Status is: Inpatient Remains inpatient appropriate because: Hypotensive, pending final plan by nephrology.  Estimated body mass index is 22.11 kg/m as calculated from the following:   Height as of this encounter: 5' 11 (1.803 m).   Weight as of this encounter: 71.9 kg.    Nutritional Assessment: Body mass index is 22.11 kg/m.SABRA Seen by dietician.  I agree with  the assessment and plan as outlined below: Nutrition Status: Nutrition Problem: Severe Malnutrition Etiology: chronic illness, poor appetite, lethargy/confusion Signs/Symptoms: severe muscle depletion, severe fat depletion Interventions: Refer to RD note for recommendations  . Skin Assessment: I have examined the patient's skin and I agree with the wound assessment as performed by the wound care RN as outlined below:    Consultants:  GI, nephrology, critical care  Procedures:  As above  Antimicrobials:  Anti-infectives (From admission, onward)    Start     Dose/Rate Route Frequency Ordered Stop   03/23/24 1100  rifaximin  (XIFAXAN ) tablet 550 mg        550 mg Oral 2 times daily 03/23/24 1003     03/22/24 2200  metroNIDAZOLE  (FLAGYL ) IVPB 500 mg  Status:  Discontinued        500 mg 100 mL/hr over 60 Minutes Intravenous Every 12 hours 03/22/24 1226 03/23/24 0849   03/22/24 1259  vancomycin  variable dose per unstable renal function (pharmacist dosing)  Status:  Discontinued         Does not apply See admin instructions 03/22/24 1259 03/23/24 0849   03/22/24 1130  cefTRIAXone  (ROCEPHIN ) 2 g in sodium chloride  0.9 % 100 mL IVPB        2 g 200 mL/hr over 30 Minutes Intravenous Every 24 hours 03/22/24 1120     03/22/24 1030  ceFEPIme  (MAXIPIME ) 2 g in sodium chloride  0.9 % 100 mL IVPB  Status:  Discontinued        2 g 200  mL/hr over 30 Minutes Intravenous  Once 03/22/24 1021 03/22/24 1120   03/22/24 1030  metroNIDAZOLE  (FLAGYL ) IVPB 500 mg        500 mg 100 mL/hr over 60 Minutes Intravenous  Once 03/22/24 1021 03/22/24 1421   03/22/24 1030  vancomycin  (VANCOREADY) IVPB 1750 mg/350 mL        1,750 mg 175 mL/hr over 120 Minutes Intravenous  Once 03/22/24 1022 03/22/24 2137         Subjective: Patient seen and examined, wife and daughter at the bedside.  Patient says that he is feeling much better, although still has mild shortness of breath and feels weak.  No other complaint  however overall he feels better and wife at the bedside also agrees that he looks much better today. Objective: Vitals:   03/26/24 1953 03/26/24 2320 03/27/24 0419 03/27/24 0700  BP:  (!) 86/62 90/61 96/70   Pulse: 93 87 84 96  Resp: (!) 22 19 17    Temp:   97.8 F (36.6 C) (!) 97.5 F (36.4 C)  TempSrc:   Axillary Oral  SpO2: 100% 97% 96% 95%  Weight:   71.9 kg   Height:        Intake/Output Summary (Last 24 hours) at 03/27/2024 0756 Last data filed at 03/27/2024 0448 Gross per 24 hour  Intake 0 ml  Output 700 ml  Net -700 ml   Filed Weights   03/26/24 1356 03/26/24 1751 03/27/24 0419  Weight: 74.4 kg 74.2 kg 71.9 kg    Examination: General exam: Appears calm and comfortable  Respiratory system: Diminished breath sounds at the bases with no crackles, poor inspiratory effort. Cardiovascular system: S1 & S2 heard, RRR. No JVD, murmurs, rubs, gallops or clicks. No pedal edema. Gastrointestinal system: Abdomen is nondistended, soft and nontender. No organomegaly or masses felt. Normal bowel sounds heard. Central nervous system: Alert and oriented. No focal neurological deficits. Extremities: Symmetric 5 x 5 power. Skin: No rashes, lesions or ulcers.  Psychiatry: Judgement and insight appear normal. Mood & affect appropriate.   Data Reviewed: I have personally reviewed following labs and imaging studies  CBC: Recent Labs  Lab 03/22/24 0852 03/22/24 0929 03/23/24 1412 03/24/24 0535 03/25/24 0551 03/26/24 0938 03/27/24 0425  WBC 18.6*   < > 12.1* 10.8* 12.7* 10.4 10.3  NEUTROABS 17.5*  --   --   --  11.5* 8.9* 8.9*  HGB 11.0*   < > 8.1* 9.2* 9.8* 10.0* 9.5*  HCT 35.8*   < > 25.4* 29.3* 31.2* 32.5* 30.4*  MCV 89.9   < > 87.9 87.5 86.4 88.6 89.1  PLT 720*   < > 473* 503* 496* 415* 379   < > = values in this interval not displayed.   Basic Metabolic Panel: Recent Labs  Lab 03/23/24 0518 03/24/24 0535 03/25/24 0551 03/26/24 0938 03/27/24 0425  NA 136 137 134* 136  137  K 4.6 3.9 3.7 4.4 4.0  CL 106 103 101 100 97*  CO2 16* 20* 20* 19* 23  GLUCOSE 102* 98 167* 129* 91  BUN 108* 63* 73* 78* 43*  CREATININE 2.44* 1.94* 2.40* 2.81* 2.15*  CALCIUM  8.4* 8.3* 8.3* 8.9 8.3*  PHOS 5.0* 3.3 3.0  --   --    GFR: Estimated Creatinine Clearance: 27.4 mL/min (A) (by C-G formula based on SCr of 2.15 mg/dL (H)). Liver Function Tests: Recent Labs  Lab 03/22/24 0852 03/23/24 0517 03/23/24 0518 03/24/24 0535 03/27/24 0425  AST 32 34  --   --  28  ALT 24 18  --   --  16  ALKPHOS 142* 97  --   --  123  BILITOT 0.3 0.6  --   --  0.7  PROT 6.5 5.7*  --   --  5.4*  ALBUMIN  1.7* 2.4* 2.4* 2.2* 2.1*   Recent Labs  Lab 03/22/24 0852  LIPASE 86*   Recent Labs  Lab 03/22/24 0852 03/25/24 2206  AMMONIA 28 32   Coagulation Profile: Recent Labs  Lab 03/22/24 0852 03/23/24 0517 03/24/24 0921 03/25/24 0855 03/26/24 0938  INR 2.4* 2.8* 2.8* 2.5* 2.2*   Cardiac Enzymes: No results for input(s): CKTOTAL, CKMB, CKMBINDEX, TROPONINI in the last 168 hours. BNP (last 3 results) No results for input(s): PROBNP in the last 8760 hours. HbA1C: No results for input(s): HGBA1C in the last 72 hours. CBG: Recent Labs  Lab 03/26/24 0140 03/26/24 0530 03/26/24 1222 03/26/24 2351 03/27/24 0616  GLUCAP 183* 141* 149* 87 92   Lipid Profile: No results for input(s): CHOL, HDL, LDLCALC, TRIG, CHOLHDL, LDLDIRECT in the last 72 hours. Thyroid Function Tests: No results for input(s): TSH, T4TOTAL, FREET4, T3FREE, THYROIDAB in the last 72 hours. Anemia Panel: No results for input(s): VITAMINB12, FOLATE, FERRITIN, TIBC, IRON , RETICCTPCT in the last 72 hours.  Sepsis Labs: Recent Labs  Lab 03/22/24 0931  LATICACIDVEN 0.9    Recent Results (from the past 240 hours)  Resp panel by RT-PCR (RSV, Flu A&B, Covid) Anterior Nasal Swab     Status: None   Collection Time: 03/22/24  8:30 AM   Specimen: Anterior Nasal Swab   Result Value Ref Range Status   SARS Coronavirus 2 by RT PCR NEGATIVE NEGATIVE Final   Influenza A by PCR NEGATIVE NEGATIVE Final   Influenza B by PCR NEGATIVE NEGATIVE Final    Comment: (NOTE) The Xpert Xpress SARS-CoV-2/FLU/RSV plus assay is intended as an aid in the diagnosis of influenza from Nasopharyngeal swab specimens and should not be used as a sole basis for treatment. Nasal washings and aspirates are unacceptable for Xpert Xpress SARS-CoV-2/FLU/RSV testing.  Fact Sheet for Patients: BloggerCourse.com  Fact Sheet for Healthcare Providers: SeriousBroker.it  This test is not yet approved or cleared by the [place]  FDA and has been authorized for detection and/or diagnosis of SARS-CoV-2 by FDA under an Emergency Use Authorization (EUA). This EUA will remain in effect (meaning this test can be used) for the duration of the COVID-19 declaration under Section 564(b)(1) of the Act, 21 U.S.C. section 360bbb-3(b)(1), unless the authorization is terminated or revoked.     Resp Syncytial Virus by PCR NEGATIVE NEGATIVE Final    Comment: (NOTE) Fact Sheet for Patients: BloggerCourse.com  Fact Sheet for Healthcare Providers: SeriousBroker.it  This test is not yet approved or cleared by the [place]  FDA and has been authorized for detection and/or diagnosis of SARS-CoV-2 by FDA under an Emergency Use Authorization (EUA). This EUA will remain in effect (meaning this test can be used) for the duration of the COVID-19 declaration under Section 564(b)(1) of the Act, 21 U.S.C. section 360bbb-3(b)(1), unless the authorization is terminated or revoked.  Performed at [facility], [address], [place], [place] [address]   Blood culture (routine x 2)     Status: None (Preliminary result)   Collection Time: 03/22/24  9:15 AM   Specimen: BLOOD  Result Value Ref Range  Status   Specimen Description BLOOD RIGHT ANTECUBITAL  Final   Special Requests   Final  BOTTLES DRAWN AEROBIC AND ANAEROBIC Blood Culture adequate volume   Culture   Final    NO GROWTH 4 DAYS Performed at [facility], [address] [place], [place] [address]    Report Status PENDING  Incomplete  Blood culture (routine x 2)     Status: None (Preliminary result)   Collection Time: 03/22/24  9:20 AM   Specimen: BLOOD  Result Value Ref Range Status   Specimen Description BLOOD LEFT ANTECUBITAL  Final   Special Requests   Final    BOTTLES DRAWN AEROBIC AND ANAEROBIC Blood Culture adequate volume   Culture   Final    NO GROWTH 4 DAYS Performed at [facility], [address] [place], [place] [address]    Report Status PENDING  Incomplete  Body fluid culture w Gram Stain     Status: None   Collection Time: 03/22/24 12:25 PM   Specimen: Peritoneal Washings; Peritoneal Fluid  Result Value Ref Range Status   Specimen Description PERITONEAL  Final   Special Requests NONE  Final   Gram Stain RARE WBC SEEN NO ORGANISMS SEEN   Final   Culture   Final    NO GROWTH 3 DAYS Performed at [facility], [address] [place], [place] [address]    Report Status 03/25/2024 FINAL  Final  MRSA Next Gen by PCR, Nasal     Status: None   Collection Time: 03/22/24  7:21 PM   Specimen: Nasal Mucosa; Nasal Swab  Result Value Ref Range Status   MRSA by PCR Next Gen NOT DETECTED NOT DETECTED Final    Comment: (NOTE) The GeneXpert MRSA Assay (FDA approved for NASAL specimens only), is one component of a comprehensive MRSA colonization surveillance program. It is not intended to diagnose MRSA infection nor to guide or monitor treatment for MRSA infections. Test performance is not FDA approved in patients less than 8 years old. Performed at [facility], [address] [place], [place] [address]      Radiology Studies: IR Paracentesis Result Date:  03/26/2024 INDICATION: 83 year old male. History of cirrhosis with recurrent ascites. Request is for therapeutic paracentesis. EXAM: ULTRASOUND GUIDED THERAPEUTIC LEFT-SIDED PARACENTESIS MEDICATIONS: Lidocaine  1% 10 mL COMPLICATIONS: None immediate. PROCEDURE: Informed written consent was obtained from the patient after a discussion of the risks, benefits and alternatives to treatment. A timeout was performed prior to the initiation of the procedure. Initial ultrasound scanning demonstrates a moderate amount of ascites within the left lower abdominal quadrant. The left lower abdomen was prepped and draped in the usual sterile fashion. 1% lidocaine  was used for local anesthesia. Following this, a 19 gauge, 7-cm, Yueh catheter was introduced. An ultrasound image was saved for documentation purposes. The paracentesis was performed. The catheter was removed and a dressing was applied. The patient tolerated the procedure well without immediate post procedural complication. FINDINGS: A total of approximately 4.1 L of straw-colored fluid was removed. IMPRESSION: Successful ultrasound-guided therapeutic paracentesis yielding 4.1 L liters of peritoneal fluid. Performed by Delon Beagle NP PLAN: The patient has required >/=2 paracenteses in a 30 day period and a formal evaluation by the [facility] has been arranged. Thom Hall, MD [facility] Radiology Electronically Signed   By: Thom Hall M.D.   On: 03/26/2024 13:02     Scheduled Meds:  atorvastatin   40 mg Oral Daily   Chlorhexidine  Gluconate Cloth  6 each Topical Q0600  colesevelam   1,875 mg Oral BID WC   feeding supplement (NEPRO CARB STEADY)  237 mL Oral BID BM   fluticasone  furoate-vilanterol  1 puff Inhalation Daily   folic acid   1 mg Oral Daily   iron  polysaccharides  150 mg Oral Daily   midodrine   10 mg Oral TID WC   multivitamin  1 tablet Oral  QHS   octreotide   100 mcg Subcutaneous Q8H   mouth rinse  15 mL Mouth Rinse 4 times per day   pantoprazole   40 mg Oral BID AC   rifaximin   550 mg Oral BID   sodium chloride  flush  3 mL Intravenous Q12H   thiamine   100 mg Oral Daily   Continuous Infusions:  cefTRIAXone  (ROCEPHIN )  IV 2 g (03/26/24 1230)     LOS: 5 days   Fredia Skeeter, MD [facility]  03/27/2024, 7:56 AM   *Please note that this is a verbal dictation therefore any spelling or grammatical errors are due to the Dragon Medical One system interpretation.  Please page via Amion and do not message via secure chat for urgent patient care matters. Secure chat can be used for non urgent patient care matters.  How to contact the [facility] Attending or Consulting provider 7A - 7P or covering provider during after hours 7P -7A, for this patient?  Check the care team in [facility] and look for a) attending/consulting [facility] provider listed and b) the [facility] team listed. Page or secure chat 7A-7P. Log into [email or web address] and use [place]'s universal password to access. If you do not have the password, please contact the hospital operator. Locate the [facility] provider you are looking for under [facility] and page to a number that you can be directly reached. If you still have difficulty reaching the provider, please page the [facility] (Director on Call) for the Hospitalists listed on amion for assistance.

## 2024-03-27 NOTE — Progress Notes (Signed)
 Occupational Therapy Treatment Patient Details Name: Darren [name] MRN: [id number] DOB: 04-28-1941 Today's Date: 03/27/2024   History of present illness Pt is an 83 yo male who presented 03/22/24 due to AMS. Pt admitted due to severe AKI and sepsis. HD initiated 9/13 due to uremic encephalopathy and multiple metabolic abnormalities. 9/17 s/p therapeutic paracentesis PMH includes: HTN, CKD, afib, anemia, diastolic CHF, OSA, obesity, COPD, liver cirrhosis with ascites for which he gets frequent paracentesis (last on 9/4 with 5.3L removed).   OT comments  Pt at this time wanted to complete sponge bath but was limited to bed level due to bps as seen bellow. Pt required min assist with UE and max/total for LE bathing/dressing. Pt attempted to sit at EOB with mod assist and completed lateral scoots with mod max assist to [facility] but required to back into supine. Patient will benefit from continued inpatient follow up therapy, <3 hours/day.  BP: Supine: 88/68 (76) Post ADLS while supine: 94/70 (79) Sitting: 86/55 (64) Post lateral scoots: 70/86 (62)       If plan is discharge home, recommend the following:  A lot of help with walking and/or transfers;A lot of help with bathing/dressing/bathroom;Assistance with cooking/housework;Assist for transportation;Help with stairs or ramp for entrance   Equipment Recommendations  BSC/3in1    Recommendations for Other Services      Precautions / Restrictions Precautions Precautions: Fall Recall of Precautions/Restrictions: Impaired Precaution/Restrictions Comments: orthostatic Restrictions Weight Bearing Restrictions Per Provider Order: No       Mobility Bed Mobility Overal bed mobility: Needs Assistance Bed Mobility: Supine to Sit, Sit to Supine     Supine to sit: Mod assist Sit to supine: Mod assist   General bed mobility comments: mod-max cues on how to sequence    Transfers Overall transfer level: Needs assistance                 Lateral/Scoot Transfers: Mod assist, Max assist       Balance Overall balance assessment: Needs assistance Sitting-balance support: Feet supported, Bilateral upper extremity supported Sitting balance-Leahy Scale: Fair Sitting balance - Comments: able to sit while completion of UE bathing but required to go back to supine due to BP                                   ADL either performed or assessed with clinical judgement   ADL Overall ADL's : Needs assistance/impaired Eating/Feeding: Set up;Sitting   Grooming: Set up;Sitting;Bed level   Upper Body Bathing: Minimal assistance;Sitting   Lower Body Bathing: Maximal assistance;Bed level   Upper Body Dressing : Minimal assistance;Sitting   Lower Body Dressing: Maximal assistance;Bed level                      Extremity/Trunk Assessment Upper Extremity Assessment Upper Extremity Assessment: Generalized weakness   Lower Extremity Assessment Lower Extremity Assessment: Defer to PT evaluation        Vision       Perception     Praxis     Communication Communication Communication: No apparent difficulties   Cognition Arousal: Alert Behavior During Therapy: WFL for tasks assessed/performed               OT - Cognition Comments: Pt appears back to baseline  Cueing   Cueing Techniques: Verbal cues  Exercises      Shoulder Instructions       General Comments Pt on RA in session 90-97%    Pertinent Vitals/ Pain       Pain Assessment Pain Assessment: No/denies pain Facial Expression: Relaxed, neutral Body Movements: Absence of movements Muscle Tension: Relaxed Vocalization (extubated pts.): N/A  Home Living                                          Prior Functioning/Environment              Frequency  Min 2X/week        Progress Toward Goals  OT Goals(current goals can now be found in the care plan section)   Progress towards OT goals: Progressing toward goals  Acute Rehab OT Goals Patient Stated Goal: to get stronger OT Goal Formulation: With patient Time For Goal Achievement: 04/07/24 Potential to Achieve Goals: Fair ADL Goals Pt Will Perform Grooming: with modified independence;sitting Pt Will Perform Upper Body Bathing: with modified independence;sitting Pt Will Perform Lower Body Bathing: with mod assist;sit to/from stand Pt Will Perform Upper Body Dressing: with contact guard assist;sitting Pt Will Perform Lower Body Dressing: with mod assist;sit to/from stand Pt Will Transfer to Toilet: ambulating;with contact guard assist;bedside commode  Plan      Co-evaluation                 AM-PAC OT 6 Clicks Daily Activity     Outcome Measure   Help from another person eating meals?: None Help from another person taking care of personal grooming?: A Little Help from another person toileting, which includes using toliet, bedpan, or urinal?: A Lot Help from another person bathing (including washing, rinsing, drying)?: A Lot Help from another person to put on and taking off regular upper body clothing?: A Little Help from another person to put on and taking off regular lower body clothing?: A Lot 6 Click Score: 16    End of Session Equipment Utilized During Treatment: Gait belt  OT Visit Diagnosis: Unsteadiness on feet (R26.81);Other abnormalities of gait and mobility (R26.89);Repeated falls (R29.6);Muscle weakness (generalized) (M62.81)   Activity Tolerance Patient limited by lethargy;Other (comment) (bp)   Patient Left in bed;with call bell/phone within reach;with bed alarm set;with family/visitor present   Nurse Communication Mobility status (bp)        Time: 9083-8996 OT Time Calculation (min): 47 min  Charges: OT General Charges $OT Visit: 1 Visit OT Treatments $Self Care/Home Management : 38-52 mins  Warrick POUR OTR/L  Acute Rehab Services  [phone] office  number   Warrick Berber 03/27/2024, 12:24 PM

## 2024-03-28 DIAGNOSIS — N179 Acute kidney failure, unspecified: Secondary | ICD-10-CM | POA: Diagnosis not present

## 2024-03-28 DIAGNOSIS — N189 Chronic kidney disease, unspecified: Secondary | ICD-10-CM | POA: Diagnosis not present

## 2024-03-28 LAB — GLUCOSE, CAPILLARY
Glucose-Capillary: 127 mg/dL — ABNORMAL HIGH (ref 70–99)
Glucose-Capillary: 191 mg/dL — ABNORMAL HIGH (ref 70–99)

## 2024-03-28 LAB — RENAL FUNCTION PANEL
Albumin: 1.9 g/dL — ABNORMAL LOW (ref 3.5–5.0)
Anion gap: 13 (ref 5–15)
BUN: 66 mg/dL — ABNORMAL HIGH (ref 8–23)
CO2: 19 mmol/L — ABNORMAL LOW (ref 22–32)
Calcium: 8 mg/dL — ABNORMAL LOW (ref 8.9–10.3)
Chloride: 101 mmol/L (ref 98–111)
Creatinine, Ser: 3 mg/dL — ABNORMAL HIGH (ref 0.61–1.24)
GFR, Estimated: 20 mL/min — ABNORMAL LOW (ref >60)
Glucose, Bld: 125 mg/dL — ABNORMAL HIGH (ref 70–99)
Phosphorus: 4 mg/dL (ref 2.5–4.6)
Potassium: 4.5 mmol/L (ref 3.5–5.1)
Sodium: 133 mmol/L — ABNORMAL LOW (ref 135–145)

## 2024-03-28 MED ORDER — POLYETHYLENE GLYCOL 3350 17 G PO PACK
17.0000 g | PACK | Freq: Every day | ORAL | Status: DC
  Administered 2024-03-29 – 2024-04-02 (×5): 17 g via ORAL
  Filled 2024-03-28 (×5): qty 1

## 2024-03-28 MED ORDER — DOUBLE ANTIBIOTIC 500-10000 UNIT/GM EX OINT
TOPICAL_OINTMENT | Freq: Every day | CUTANEOUS | Status: DC
  Filled 2024-03-28: qty 28.4

## 2024-03-28 MED ORDER — CHLORHEXIDINE GLUCONATE CLOTH 2 % EX PADS
6.0000 | MEDICATED_PAD | Freq: Every day | CUTANEOUS | Status: DC
  Administered 2024-03-29 – 2024-04-02 (×5): 6 via TOPICAL

## 2024-03-28 NOTE — Progress Notes (Signed)
 PROGRESS NOTE  Darren [name]  FMW:[id number] DOB: 26-Jul-1940 DOA: 03/22/2024 PCP: [name], MD  Consultants  Brief Narrative: 83 y.o. male with medical history significant of hypertension, CKD, atrial fibrillation, anemia, diastolic CHF, OSA, obesity, COPD, cirrhosis with ascites presented with worsening mental status for 5 days prior to coming followed by difficulty ambulating. 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 Has history of cirrhosis and has been getting paracentesis.  Last paracentesis was a week ago on 9/4 and had 5.3 L removed.  Did see a nephrologist a day prior, per chart review and there was concern about his worsening renal function and he was started on increased dose of spironolactone  with increased frequency of Lasix .  Presented to ED due to worsening symptoms.     Assessment & Plan: Acute uremic encephalopathy/AKI on CKD stage IIIa/hyperkalemia/metabolic acidosis/hyponatremia, POA: - Baseline creatinine around 1.3-1.4 in September 2024.  Presented with creatinine of 3.34 which peaked at 3.8 along with progressive decline in mental status for past 5 days now with decreased ability to ambulate and significant confusion.  Likely HRS. - Resolving s/p HD.   - Greatly appreciate renal input.  Plan is for repeat HD 9/20. -Remains off Aldactone . - No urine thus far to today.  Did have some urinary output with Lasix  9/18. -Follow for renal recovery.   Acute hypoxic respiratory failure secondary to fluid overload:  - Patient developed crackles and he was tachypneic on the morning of 03/26/2024.  Nephrology was informed, urgent dialysis was arranged.  Also IR was consulted, therapeutic paracentesis was completed with removal of 4.1 L fluid.  He feels much better without any shortness of breath and he is on room air now. -Abdominal distention slowly recurring.  No overt need for paracentesis today.   Patient with decompensated liver  cirrhosis here with severe sepsis due to possible SBP, POA: SIRS Met severe sepsis criteria based on tachycardia, leukocytosis and encephalopathy as well as AKI on CKD.   - Underwent diagnostic paracentesis by critical care team, patient started on Flagyl , Rocephin  and vancomycin  fluid analysis indicative of SBP so Flagyl  and vancomycin  discontinued 03/23/2024.  Patient's INR 2.5 on 03/23/2024, Eliquis  discontinued 03/24/2024 per GI recommendations.  Continue midodrine , octreotide  and PPI.  Patient appeared to have worsening ascites but no tenderness and he was in respiratory distress, IR was consulted, paracentesis with yield of 4.1 L completed on 03/26/2024.   - Continues to appear comfortable. -Antibiotics stop date 9/20   Hypertension - Holding antihypertensives in the setting of low blood pressure and initiation of dialysis.   - BPs past 24 hours 100s to 110s systolic.  Denies any lightheadedness.   Atrial fibrillation - Holding metoprolol  with low blood pressure.  Rates controlled.  Discontinued Eliquis  as mentioned above.   Acute normocytic anemia, POA: Hemoglobin was around 11 about a year ago, presented with 11.9 yesterday and down to 7.5 on 03/23/2024 however it improved to 9.2 on 03/24/2024 without any transfusion..  No reports of hematochezia, hematemesis or melena.  -Most likely secondary to CKD above.   Diastolic CHF > Echo in 2022 with EF 50-55%, G2 DD, moderate pulmonary regurgitation. - Holding diuretics in the setting of dehydration/AKI/initiation of HD.   OSA - Continue home CPAP   COPD - Replaced home Dulera  with formulary Breo   Diarrhea:  - Now resolved.  Nutrition Problem: Severe Malnutrition Etiology: chronic illness, poor appetite, lethargy/confusion Signs/Symptoms: severe muscle depletion, severe fat depletion Interventions: Refer to RD  note for recommendations Wound 03/22/24 1845 Pressure Injury Sacrum Medial Stage 1 -  Intact skin with non-blanchable redness of  a localized area usually over a bony prominence. (Active)   DVT prophylaxis:  - holding in light of bleeding risk w/ elevated INR 2/2 cirrhosis  Code Status:   Code Status: Limited: Do not attempt resuscitation (DNR) -DNR-LIMITED -Do Not Intubate/DNI  Family Communication: All 4 of patient's children at bedside and all questions answered. Level of care: Progressive Status is: Inpatient   Consults called: Nephrology, palliative care, gastroenterology  Subjective: Patient awake and alert.  No complaints today.  Objective: Vitals:   03/28/24 0445 03/28/24 0800 03/28/24 0850 03/28/24 1145  BP: 103/70 101/70  110/77  Pulse: 78 95  97  Resp: 20 (!) 24  20  Temp: 97.9 F (36.6 C) 98.5 F (36.9 C)  98.5 F (36.9 C)  TempSrc: Oral Oral  Oral  SpO2: 98% 96% 94% 99%  Weight: 71.5 kg     Height:        Intake/Output Summary (Last 24 hours) at 03/28/2024 1538 Last data filed at 03/27/2024 2336 Gross per 24 hour  Intake 240 ml  Output 400 ml  Net -160 ml   Filed Weights   03/26/24 1751 03/27/24 0419 03/28/24 0445  Weight: 74.2 kg 71.9 kg 71.5 kg   Body mass index is 21.98 kg/m.  Gen: 83 y.o. male in no apparent distress.  Nontoxic, elderly, somewhat frail appearing. Pulm: Non-labored breathing.  Clear to auscultation bilaterally.  CV: Regular rate and rhythm. No murmur, rub, or gallop. No JVD GI: Abdomen soft, non-tender minimal distention noted. Ext: Warm, no deformities, bruising bilateral arms from prior IV sticks.  No lower extremity edema. Skin: No rashes, lesions  Neuro: Alert and oriented to person, place, year.  Knows he has been hospitalized for kidney issue.  No notable neurological deficits. Psych: Calm  Judgement and insight appear normal. Mood & affect appropriate.     I have personally reviewed the following labs and images: CBC: Recent Labs  Lab 03/22/24 0852 03/22/24 0929 03/23/24 1412 03/24/24 0535 03/25/24 0551 03/26/24 0938 03/27/24 0425  WBC  18.6*   < > 12.1* 10.8* 12.7* 10.4 10.3  NEUTROABS 17.5*  --   --   --  11.5* 8.9* 8.9*  HGB 11.0*   < > 8.1* 9.2* 9.8* 10.0* 9.5*  HCT 35.8*   < > 25.4* 29.3* 31.2* 32.5* 30.4*  MCV 89.9   < > 87.9 87.5 86.4 88.6 89.1  PLT 720*   < > 473* 503* 496* 415* 379   < > = values in this interval not displayed.   BMP &GFR Recent Labs  Lab 03/23/24 0518 03/24/24 0535 03/25/24 0551 03/26/24 0938 03/27/24 0425 03/28/24 0839  NA 136 137 134* 136 137 133*  K 4.6 3.9 3.7 4.4 4.0 4.5  CL 106 103 101 100 97* 101  CO2 16* 20* 20* 19* 23 19*  GLUCOSE 102* 98 167* 129* 91 125*  BUN 108* 63* 73* 78* 43* 66*  CREATININE 2.44* 1.94* 2.40* 2.81* 2.15* 3.00*  CALCIUM  8.4* 8.3* 8.3* 8.9 8.3* 8.0*  PHOS 5.0* 3.3 3.0  --   --  4.0   Estimated Creatinine Clearance: 19.5 mL/min (A) (by C-G formula based on SCr of 3 mg/dL (H)). Liver & Pancreas: Recent Labs  Lab 03/22/24 9147 03/23/24 0517 03/23/24 0518 03/24/24 0535 03/27/24 0425 03/28/24 0839  AST 32 34  --   --  28  --  ALT 24 18  --   --  16  --   ALKPHOS 142* 97  --   --  123  --   BILITOT 0.3 0.6  --   --  0.7  --   PROT 6.5 5.7*  --   --  5.4*  --   ALBUMIN  1.7* 2.4* 2.4* 2.2* 2.1* 1.9*   Recent Labs  Lab 03/22/24 0852  LIPASE 86*   Recent Labs  Lab 03/22/24 0852 03/25/24 2206  AMMONIA 28 32   Diabetic: No results for input(s): HGBA1C in the last 72 hours. Recent Labs  Lab 03/27/24 0616 03/27/24 1338 03/27/24 2321 03/28/24 0650 03/28/24 1240  GLUCAP 92 190* 189* 127* 191*   Cardiac Enzymes: No results for input(s): CKTOTAL, CKMB, CKMBINDEX, TROPONINI in the last 168 hours. No results for input(s): PROBNP in the last 8760 hours. Coagulation Profile: Recent Labs  Lab 03/22/24 0852 03/23/24 0517 03/24/24 0921 03/25/24 0855 03/26/24 0938  INR 2.4* 2.8* 2.8* 2.5* 2.2*   Thyroid Function Tests: No results for input(s): TSH, T4TOTAL, FREET4, T3FREE, THYROIDAB in the last 72 hours. Lipid  Profile: No results for input(s): CHOL, HDL, LDLCALC, TRIG, CHOLHDL, LDLDIRECT in the last 72 hours. Anemia Panel: No results for input(s): VITAMINB12, FOLATE, FERRITIN, TIBC, IRON , RETICCTPCT in the last 72 hours. Urine analysis:    Component Value Date/Time   COLORURINE YELLOW 03/23/2024 0320   APPEARANCEUR HAZY (A) 03/23/2024 0320   LABSPEC 1.015 03/23/2024 0320   PHURINE 5.0 03/23/2024 0320   GLUCOSEU NEGATIVE 03/23/2024 0320   HGBUR NEGATIVE 03/23/2024 0320   BILIRUBINUR NEGATIVE 03/23/2024 0320   KETONESUR NEGATIVE 03/23/2024 0320   PROTEINUR NEGATIVE 03/23/2024 0320   UROBILINOGEN 0.2 09/07/2008 1037   NITRITE NEGATIVE 03/23/2024 0320   LEUKOCYTESUR NEGATIVE 03/23/2024 0320   Sepsis Labs: Invalid input(s): PROCALCITONIN, LACTICIDVEN  Microbiology: Recent Results (from the past 240 hours)  Resp panel by RT-PCR (RSV, Flu A&B, Covid) Anterior Nasal Swab     Status: None   Collection Time: 03/22/24  8:30 AM   Specimen: Anterior Nasal Swab  Result Value Ref Range Status   SARS Coronavirus 2 by RT PCR NEGATIVE NEGATIVE Final   Influenza A by PCR NEGATIVE NEGATIVE Final   Influenza B by PCR NEGATIVE NEGATIVE Final    Comment: (NOTE) The Xpert Xpress SARS-CoV-2/FLU/RSV plus assay is intended as an aid in the diagnosis of influenza from Nasopharyngeal swab specimens and should not be used as a sole basis for treatment. Nasal washings and aspirates are unacceptable for Xpert Xpress SARS-CoV-2/FLU/RSV testing.  Fact Sheet for Patients: BloggerCourse.com  Fact Sheet for Healthcare Providers: SeriousBroker.it  This test is not yet approved or cleared by the [place]  FDA and has been authorized for detection and/or diagnosis of SARS-CoV-2 by FDA under an Emergency Use Authorization (EUA). This EUA will remain in effect (meaning this test can be used) for the duration of the COVID-19 declaration  under Section 564(b)(1) of the Act, 21 U.S.C. section 360bbb-3(b)(1), unless the authorization is terminated or revoked.     Resp Syncytial Virus by PCR NEGATIVE NEGATIVE Final    Comment: (NOTE) Fact Sheet for Patients: BloggerCourse.com  Fact Sheet for Healthcare Providers: SeriousBroker.it  This test is not yet approved or cleared by the [place]  FDA and has been authorized for detection and/or diagnosis of SARS-CoV-2 by FDA under an Emergency Use Authorization (EUA). This EUA will remain in effect (meaning this test can be used) for the duration of the COVID-19 declaration  under Section 564(b)(1) of the Act, 21 U.S.C. section 360bbb-3(b)(1), unless the authorization is terminated or revoked.  Performed at [facility], [address], [place], [place] [address]   Blood culture (routine x 2)     Status: None   Collection Time: 03/22/24  9:15 AM   Specimen: BLOOD  Result Value Ref Range Status   Specimen Description BLOOD RIGHT ANTECUBITAL  Final   Special Requests   Final    BOTTLES DRAWN AEROBIC AND ANAEROBIC Blood Culture adequate volume   Culture   Final    NO GROWTH 5 DAYS Performed at [facility], [address] [place], [place] [address]    Report Status 03/27/2024 FINAL  Final  Blood culture (routine x 2)     Status: None   Collection Time: 03/22/24  9:20 AM   Specimen: BLOOD  Result Value Ref Range Status   Specimen Description BLOOD LEFT ANTECUBITAL  Final   Special Requests   Final    BOTTLES DRAWN AEROBIC AND ANAEROBIC Blood Culture adequate volume   Culture   Final    NO GROWTH 5 DAYS Performed at [facility], [address], [place], [place] [address]    Report Status 03/27/2024 FINAL  Final  Body fluid culture w Gram Stain     Status: None   Collection Time: 03/22/24 12:25 PM   Specimen: Peritoneal Washings; Peritoneal Fluid  Result Value Ref Range Status   Specimen  Description PERITONEAL  Final   Special Requests NONE  Final   Gram Stain RARE WBC SEEN NO ORGANISMS SEEN   Final   Culture   Final    NO GROWTH 3 DAYS Performed at [facility], [address], [place], [place] [address]    Report Status 03/25/2024 FINAL  Final  MRSA Next Gen by PCR, Nasal     Status: None   Collection Time: 03/22/24  7:21 PM   Specimen: Nasal Mucosa; Nasal Swab  Result Value Ref Range Status   MRSA by PCR Next Gen NOT DETECTED NOT DETECTED Final    Comment: (NOTE) The GeneXpert MRSA Assay (FDA approved for NASAL specimens only), is one component of a comprehensive MRSA colonization surveillance program. It is not intended to diagnose MRSA infection nor to guide or monitor treatment for MRSA infections. Test performance is not FDA approved in patients less than 79 years old. Performed at [facility], [address], [place], [place] [address]     Radiology Studies: No results found.  Scheduled Meds:  atorvastatin   40 mg Oral Daily   Chlorhexidine  Gluconate Cloth  6 each Topical Q0600   colesevelam   1,875 mg Oral BID WC   feeding supplement (NEPRO CARB STEADY)  237 mL Oral BID BM   fluticasone  furoate-vilanterol  1 puff Inhalation Daily   folic acid   1 mg Oral Daily   iron  polysaccharides  150 mg Oral Daily   midodrine   10 mg Oral TID WC   multivitamin  1 tablet Oral QHS   octreotide   100 mcg Subcutaneous Q8H   mouth rinse  15 mL Mouth Rinse 4 times per day   pantoprazole   40 mg Oral BID AC   [START ON 03/29/2024] polymixin-bacitracin    Topical Daily   rifaximin   550 mg Oral BID   sodium chloride  flush  3 mL Intravenous Q12H   thiamine   100 mg Oral Daily   Continuous Infusions:  cefTRIAXone  (ROCEPHIN )  IV 2 g (  03/28/24 1221)     LOS: 6 days   35 minutes with more than 50% spent in reviewing records, counseling patient/family and coordinating care.  Reyes VEAR Gaw, MD [facility] [email or web address] 03/28/2024, 3:38 PM

## 2024-03-28 NOTE — Plan of Care (Signed)
  Problem: Clinical Measurements: Goal: Respiratory complications will improve Outcome: Progressing Goal: Cardiovascular complication will be avoided Outcome: Progressing   Problem: Activity: Goal: Risk for activity intolerance will decrease Outcome: Progressing   Problem: Nutrition: Goal: Adequate nutrition will be maintained Outcome: Progressing   Problem: Safety: Goal: Ability to remain free from injury will improve Outcome: Progressing   Problem: Nutritional: Goal: Ability to make healthy dietary choices will improve Outcome: Progressing   Problem: Clinical Measurements: Goal: Complications related to the disease process, condition or treatment will be avoided or minimized Outcome: Progressing   Problem: Activity: Goal: Capacity to carry out activities will improve Outcome: Progressing   Problem: Cardiac: Goal: Ability to achieve and maintain adequate cardiopulmonary perfusion will improve Outcome: Progressing   Problem: Activity: Goal: Ability to tolerate increased activity will improve Outcome: Progressing   Problem: Cardiac: Goal: Ability to achieve and maintain adequate cardiopulmonary perfusion will improve Outcome: Progressing

## 2024-03-28 NOTE — Progress Notes (Signed)
 Physical Therapy Treatment Patient Details Name: Darren [name] MRN: [id number] DOB: 1940/08/22 Today's Date: 03/28/2024   History of Present Illness Pt is an 83 yo male who presented 03/22/24 due to AMS. Pt admitted due to severe AKI and sepsis. HD initiated 9/13 due to uremic encephalopathy and multiple metabolic abnormalities. 9/17 s/p therapeutic paracentesis PMH includes: HTN, CKD, afib, anemia, diastolic CHF, OSA, obesity, COPD, liver cirrhosis with ascites for which he gets frequent paracentesis (last on 9/4 with 5.3L removed).    PT Comments  Pt is slowly progressing toward goals, still weak, deconditioned, a little foggy and still scared of slipping.  Emphasis on warm up exercise, work on transitions to sitting via R elbow with stress on scooting, sitting balance, STS into the RW x3 with min to mod of 1 to 2 persons and pivot transfers in the RW to the recliner.     If plan is discharge home, recommend the following: Two people to help with walking and/or transfers;A lot of help with bathing/dressing/bathroom;Assistance with cooking/housework;Direct supervision/assist for medications management;Direct supervision/assist for financial management;Assist for transportation;Help with stairs or ramp for entrance;Supervision due to cognitive status   Can travel by private vehicle     No  Equipment Recommendations   (TBD)    Recommendations for Other Services       Precautions / Restrictions Precautions Precautions: Fall Recall of Precautions/Restrictions: Impaired Precaution/Restrictions Comments: orthostatic     Mobility  Bed Mobility Overal bed mobility: Needs Assistance Bed Mobility: Sidelying to Sit, Sit to Supine   Sidelying to sit: Mod assist       General bed mobility comments: cues for best technique, rolled with min and mod from transitions from R elbow to sitting.x2    Transfers Overall transfer level: Needs assistance   Transfers: Sit to/from Stand Sit to  Stand: Min assist, Mod assist, +2 physical assistance (min assist x1 and mod assist x2)   Step pivot transfers: Mod assist, +2 safety/equipment       General transfer comment: STS into the RW x3 with low amplitude steps in placed, w/shifts, cues for upright posture.  step pivot transfer with pt primarily maneuvering the RW with lower amplitude steps.    Ambulation/Gait               General Gait Details: pt unable to walker today due to fatigue   Stairs             Wheelchair Mobility     Tilt Bed    Modified Rankin (Stroke Patients Only)       Balance Overall balance assessment: Needs assistance Sitting-balance support: Feet supported, Bilateral upper extremity supported, Single extremity supported Sitting balance-Leahy Scale: Fair     Standing balance support: Bilateral upper extremity supported Standing balance-Leahy Scale: Poor Standing balance comment: BUE support and  minA to maintain static stance, minA for pre gait                            Communication Communication Communication: No apparent difficulties  Cognition Arousal: Alert Behavior During Therapy: WFL for tasks assessed/performed                           PT - Cognition Comments: still a little foggy Following commands: Impaired Following commands impaired: Follows one step commands with increased time    Cueing Cueing Techniques: Verbal cues  Exercises Other Exercises Other Exercises: hip/knee  flex/ext ROM exercise for warm up with graded resistance x10 reps bil Other Exercises: bicep/tricep presses with graded resistance x 10 reps bil.    General Comments General comments (skin integrity, edema, etc.): VSS      Pertinent Vitals/Pain Pain Assessment Pain Assessment: No/denies pain    Home Living                          Prior Function            PT Goals (current goals can now be found in the care plan section) Acute Rehab PT  Goals PT Goal Formulation: With patient/family Time For Goal Achievement: 04/07/24 Potential to Achieve Goals: Good Progress towards PT goals: Progressing toward goals    Frequency    Min 2X/week      PT Plan      Co-evaluation              AM-PAC PT 6 Clicks Mobility   Outcome Measure  Help needed turning from your back to your side while in a flat bed without using bedrails?: A Lot Help needed moving from lying on your back to sitting on the side of a flat bed without using bedrails?: A Lot Help needed moving to and from a bed to a chair (including a wheelchair)?: A Lot Help needed standing up from a chair using your arms (e.g., wheelchair or bedside chair)?: A Lot Help needed to walk in hospital room?: Total Help needed climbing 3-5 steps with a railing? : Total 6 Click Score: 10    End of Session Equipment Utilized During Treatment: Gait belt Activity Tolerance: Patient limited by fatigue;Patient tolerated treatment well Patient left: in chair;with call bell/phone within reach;with chair alarm set;with family/visitor present Nurse Communication: Mobility status PT Visit Diagnosis: Unsteadiness on feet (R26.81);Other abnormalities of gait and mobility (R26.89);Muscle weakness (generalized) (M62.81)     Time: 8859-8796 PT Time Calculation (min) (ACUTE ONLY): 23 min  Charges:    $Therapeutic Exercise: 8-22 mins $Therapeutic Activity: 8-22 mins PT General Charges $$ ACUTE PT VISIT: 1 Visit                     03/28/2024  Darren HERO., PT Acute Rehabilitation Services [phone]  (office)   Darren [name] 03/28/2024, 12:34 PM

## 2024-03-28 NOTE — Consult Note (Signed)
 WOC Nurse Consult Note: Reason for Consult: MDRPI (medical device related pressure injury) Wound type: Unstageable  Pressure Injury POA: No Measurement:see nursing notes  Wound bed:100% yellow Drainage (amount, consistency, odor) see nursing notes Periwound:dry skin  Dressing procedure/placement/frequency: Add bactroban to affected area daily   Re consult if needed, will not follow at this time. Thanks  Keano [employer], RN,CWOCN, CNS, [employer] [phone]

## 2024-03-28 NOTE — TOC Progression Note (Signed)
 Transition of Care [facility]) - Progression Note    Patient Details  Name: Darren [name] MRN: [id number] Date of Birth: 11/02/1940  Transition of Care [facility]) CM/SW Contact  Isaiah Public, LCSWA Phone Number: 03/28/2024, 12:37 PM  Clinical Narrative:     Patient has SNF bed at [facility] when medically ready. CSW will continue to follow and assist with patients dc planning needs.  Expected Discharge Plan: Skilled Nursing Facility Barriers to Discharge: Continued Medical Work up               Expected Discharge Plan and Services In-house Referral: Clinical Social Work     Living arrangements for the past 2 months: Single Family Home                                       Social Drivers of Health (SDOH) Interventions SDOH Screenings   Food Insecurity: No Food Insecurity (03/22/2024)  Housing: Low Risk  (03/22/2024)  Transportation Needs: No Transportation Needs (03/22/2024)  Utilities: Not At Risk (03/22/2024)  Social Connections: Patient Unable To Answer (03/22/2024)  Tobacco Use: Medium Risk (03/23/2024)    Readmission Risk Interventions     No data to display

## 2024-03-28 NOTE — Plan of Care (Signed)
  Problem: Education: Goal: Knowledge of General Education information will improve Description: Including pain rating scale, medication(s)/side effects and non-pharmacologic comfort measures Outcome: Progressing   Problem: Health Behavior/Discharge Planning: Goal: Ability to manage health-related needs will improve Outcome: Progressing   Problem: Clinical Measurements: Goal: Ability to maintain clinical measurements within normal limits will improve Outcome: Progressing Goal: Will remain free from infection Outcome: Progressing Goal: Diagnostic test results will improve Outcome: Progressing Goal: Respiratory complications will improve Outcome: Progressing Goal: Cardiovascular complication will be avoided Outcome: Progressing   Problem: Activity: Goal: Risk for activity intolerance will decrease Outcome: Progressing   Problem: Nutrition: Goal: Adequate nutrition will be maintained Outcome: Progressing   Problem: Coping: Goal: Level of anxiety will decrease Outcome: Progressing   Problem: Elimination: Goal: Will not experience complications related to bowel motility Outcome: Progressing Goal: Will not experience complications related to urinary retention Outcome: Progressing   Problem: Pain Managment: Goal: General experience of comfort will improve and/or be controlled Outcome: Progressing   Problem: Safety: Goal: Ability to remain free from injury will improve Outcome: Progressing   Problem: Skin Integrity: Goal: Risk for impaired skin integrity will decrease Outcome: Progressing   Problem: Education: Goal: Knowledge of disease and its progression will improve Outcome: Progressing Goal: Individualized Educational Video(s) Outcome: Progressing   Problem: Fluid Volume: Goal: Compliance with measures to maintain balanced fluid volume will improve Outcome: Progressing   Problem: Health Behavior/Discharge Planning: Goal: Ability to manage health-related needs  will improve Outcome: Progressing   Problem: Nutritional: Goal: Ability to make healthy dietary choices will improve Outcome: Progressing   Problem: Clinical Measurements: Goal: Complications related to the disease process, condition or treatment will be avoided or minimized Outcome: Progressing   Problem: Education: Goal: Ability to demonstrate management of disease process will improve Outcome: Progressing Goal: Ability to verbalize understanding of medication therapies will improve Outcome: Progressing Goal: Individualized Educational Video(s) Outcome: Progressing   Problem: Activity: Goal: Capacity to carry out activities will improve Outcome: Progressing   Problem: Cardiac: Goal: Ability to achieve and maintain adequate cardiopulmonary perfusion will improve Outcome: Progressing   Problem: Education: Goal: Knowledge of disease or condition will improve Outcome: Progressing Goal: Understanding of medication regimen will improve Outcome: Progressing Goal: Individualized Educational Video(s) Outcome: Progressing   Problem: Activity: Goal: Ability to tolerate increased activity will improve Outcome: Progressing   Problem: Cardiac: Goal: Ability to achieve and maintain adequate cardiopulmonary perfusion will improve Outcome: Progressing   Problem: Health Behavior/Discharge Planning: Goal: Ability to safely manage health-related needs after discharge will improve Outcome: Progressing

## 2024-03-28 NOTE — Progress Notes (Signed)
 Darren [name]  Assessment/ Plan: Pt is a 83 y.o. 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    # Acute kidney injury on CKD IIIa with severe uremic encephalopathy, hyperkalemia and acidosis: Likely due to combination of reduced effective circulatory volume caused by recent paracentesis, use of spironolactone , diuretics and HRS. It seems like the dose of Aldactone  and diuretics increased recently to manage recurrent ascites.  Kidney US  with no hydro. He started dialysis on 9/13 for severe uremic encephalopathy and multiple metabolic abnormalities.  Reportedly he was at his normal mental status until about a week ago.   -------------- - Assess dialysis needs daily - Plan for HD on 9/20, Sat - Had some response to lasix  on 9/18 - Agree with continuing midodrine  and octreotide . - assess diuretic needs daily  - Remain off of Aldactone   - Ordered strict ins/outs, daily weights  - [name] that he may not be a good candidate for long-term outpatient dialysis.  Continuing to monitor for renal recovery at this time   # Hyperkalemia due to AKI, Aldactone :  - Improved with HD   # Severe metabolic acidosis with AKI:  - improved with renal replacement therapy as above   # Presumed sepsis, unknown source:  - Blood cultures 9/13 negative  - receiving abx per primary team (on ceftriaxone )   # History of liver cirrhosis, ascites, hypoalbuminemia requiring frequent paracentesis.  Seen by GI. on Xifaxan .  # Normocytic Anemia - some component of dilution after receiving fluid in ER.  Per charting, he looked dry and dehydrated on presentation.  - iron  deficiency - started oral iron ; on abx - Transfuse as needed per primary team   - Would initiate aranesp if drops further  - CBC in AM   Disposition - continue inpatient monitoring      Subjective:  He had 400 mL urine output over 9/18.  He did get the lasix  80 mg IV once yesterday.  Last HD on 9/17 with 200 mL UF removed - he didn't tolerate the goal of 0.5 kg - 1 liter due to hypotension.  Spoke with his wife at bedside.  They have discussed his code status over the past few days and are discussing his goals of care.   Review of systems:      He states that his breathing is better than prior and notes it is not good at his baseline recently; no chest pain  No n/v Appetite is a little better but food tastes like mud   Objective Vital signs in last 24 hours: Vitals:   03/28/24 0445 03/28/24 0800 03/28/24 0850 03/28/24 1145  BP: 103/70 101/70  110/77  Pulse: 78 95  97  Resp: 20 (!) 24  20  Temp: 97.9 F (36.6 C) 98.5 F (36.9 C)  98.5 F (36.9 C)  TempSrc: Oral Oral  Oral  SpO2: 98% 96% 94% 99%  Weight: 71.5 kg     Height:       Weight change: -2.9 kg  Intake/Output Summary (Last 24 hours) at 03/28/2024 1452 Last data filed at 03/27/2024 2336 Gross per 24 hour  Intake 240 ml  Output --  Net 240 ml       Labs: RENAL PANEL Recent Labs  Lab 03/23/24 0517 03/23/24 0518 03/24/24  9464 03/25/24 0551 03/26/24 0938 03/27/24 0425 03/28/24 0839  NA  --  136 137 134* 136 137 133*  K  --  4.6 3.9 3.7 4.4 4.0 4.5  CL  --  106 103 101 100 97* 101  CO2  --  16* 20* 20* 19* 23 19*  GLUCOSE  --  102* 98 167* 129* 91 125*  BUN  --  108* 63* 73* 78* 43* 66*  CREATININE  --  2.44* 1.94* 2.40* 2.81* 2.15* 3.00*  CALCIUM   --  8.4* 8.3* 8.3* 8.9 8.3* 8.0*  PHOS  --  5.0* 3.3 3.0  --   --  4.0  ALBUMIN  2.4* 2.4* 2.2*  --   --  2.1* 1.9*    Liver Function Tests: Recent Labs  Lab 03/22/24 0852 03/23/24 0517 03/23/24 0518 03/24/24 0535 03/27/24 0425 03/28/24 0839  AST 32 34  --   --  28  --   ALT 24 18  --   --  16  --   ALKPHOS 142* 97  --    --  123  --   BILITOT 0.3 0.6  --   --  0.7  --   PROT 6.5 5.7*  --   --  5.4*  --   ALBUMIN  1.7* 2.4*   < > 2.2* 2.1* 1.9*   < > = values in this interval not displayed.   Recent Labs  Lab 03/22/24 0852  LIPASE 86*   Recent Labs  Lab 03/22/24 0852 03/25/24 2206  AMMONIA 28 32   CBC: Recent Labs    03/23/24 1412 03/24/24 0535 03/25/24 0551 03/26/24 0938 03/27/24 0425  HGB 8.1* 9.2* 9.8* 10.0* 9.5*  MCV 87.9 87.5 86.4 88.6 89.1  FERRITIN  --  407*  --   --   --   TIBC  --  104*  --   --   --   IRON   --  12*  --   --   --     Cardiac Enzymes: No results for input(s): CKTOTAL, CKMB, CKMBINDEX, TROPONINI in the last 168 hours. CBG: Recent Labs  Lab 03/27/24 0616 03/27/24 1338 03/27/24 2321 03/28/24 0650 03/28/24 1240  GLUCAP 92 190* 189* 127* 191*    Iron  Studies:  No results for input(s): IRON , TIBC, TRANSFERRIN, FERRITIN in the last 72 hours.   Studies/Results: No results found.    Medications: Infusions:  cefTRIAXone  (ROCEPHIN )  IV 2 g (03/28/24 1221)    Scheduled Medications:  atorvastatin   40 mg Oral Daily   Chlorhexidine  Gluconate Cloth  6 each Topical Q0600   colesevelam   1,875 mg Oral BID WC   feeding supplement (NEPRO CARB STEADY)  237 mL Oral BID BM   fluticasone  furoate-vilanterol  1 puff Inhalation Daily   folic acid   1 mg Oral Daily   iron  polysaccharides  150 mg Oral Daily   midodrine   10 mg Oral TID WC   multivitamin  1 tablet Oral QHS   octreotide   100 mcg Subcutaneous Q8H   mouth rinse  15 mL Mouth Rinse 4 times per day   pantoprazole   40 mg Oral BID AC   [START ON 03/29/2024] polymixin-bacitracin    Topical Daily   rifaximin   550 mg Oral BID   sodium chloride  flush  3 mL Intravenous Q12H   thiamine   100 mg Oral Daily    have reviewed scheduled and prn medications.   Physical Exam:       General elderly male in bed in  no acute distress, chronically ill-appearing  HEENT normocephalic atraumatic extraocular  movements intact sclera anicteric Neck supple trachea midline Lungs clear to auscultation bilaterally normal work of breathing at rest on room air Heart S1S2 no rub Abdomen soft distended and non-tender Extremities no pitting edema  Psych normal mood and affect Neuro alert and conversant Access RIJ nontunneled dialysis catheter      Darren [name] 03/28/2024, 3:18 PM  LOS: 6 days

## 2024-03-29 DIAGNOSIS — N179 Acute kidney failure, unspecified: Secondary | ICD-10-CM | POA: Diagnosis not present

## 2024-03-29 DIAGNOSIS — N189 Chronic kidney disease, unspecified: Secondary | ICD-10-CM | POA: Diagnosis not present

## 2024-03-29 LAB — RENAL FUNCTION PANEL
Albumin: 1.8 g/dL — ABNORMAL LOW (ref 3.5–5.0)
Anion gap: 14 (ref 5–15)
BUN: 65 mg/dL — ABNORMAL HIGH (ref 8–23)
CO2: 20 mmol/L — ABNORMAL LOW (ref 22–32)
Calcium: 7.9 mg/dL — ABNORMAL LOW (ref 8.9–10.3)
Chloride: 98 mmol/L (ref 98–111)
Creatinine, Ser: 3.17 mg/dL — ABNORMAL HIGH (ref 0.61–1.24)
GFR, Estimated: 19 mL/min — ABNORMAL LOW (ref >60)
Glucose, Bld: 103 mg/dL — ABNORMAL HIGH (ref 70–99)
Phosphorus: 4.7 mg/dL — ABNORMAL HIGH (ref 2.5–4.6)
Potassium: 4.4 mmol/L (ref 3.5–5.1)
Sodium: 132 mmol/L — ABNORMAL LOW (ref 135–145)

## 2024-03-29 LAB — CBC
HCT: 31.5 % — ABNORMAL LOW (ref 39.0–52.0)
Hemoglobin: 9.6 g/dL — ABNORMAL LOW (ref 13.0–17.0)
MCH: 26.9 pg (ref 26.0–34.0)
MCHC: 30.5 g/dL (ref 30.0–36.0)
MCV: 88.2 fL (ref 80.0–100.0)
Platelets: 384 K/uL (ref 150–400)
RBC: 3.57 MIL/uL — ABNORMAL LOW (ref 4.22–5.81)
RDW: 17.2 % — ABNORMAL HIGH (ref 11.5–15.5)
WBC: 15.2 K/uL — ABNORMAL HIGH (ref 4.0–10.5)
nRBC: 0.1 % (ref 0.0–0.2)

## 2024-03-29 LAB — GLUCOSE, CAPILLARY
Glucose-Capillary: 115 mg/dL — ABNORMAL HIGH (ref 70–99)
Glucose-Capillary: 225 mg/dL — ABNORMAL HIGH (ref 70–99)
Glucose-Capillary: 96 mg/dL (ref 70–99)

## 2024-03-29 MED ORDER — ALTEPLASE 2 MG IJ SOLR: 2.0000 mg | Freq: Once | INTRAMUSCULAR | Status: DC | PRN

## 2024-03-29 MED ORDER — HEPARIN SODIUM (PORCINE) 1000 UNIT/ML IJ SOLN
INTRAMUSCULAR | Status: AC
  Filled 2024-03-29: qty 3

## 2024-03-29 MED ORDER — ANTICOAGULANT SODIUM CITRATE 4% (200MG/5ML) IV SOLN: 5.0000 mL | Status: DC | PRN

## 2024-03-29 MED ORDER — FUROSEMIDE 10 MG/ML IJ SOLN
80.0000 mg | Freq: Once | INTRAMUSCULAR | Status: AC
  Administered 2024-03-30: 80 mg via INTRAVENOUS
  Filled 2024-03-29: qty 8

## 2024-03-29 MED ORDER — HEPARIN SODIUM (PORCINE) 1000 UNIT/ML DIALYSIS
1000.0000 [IU] | INTRAMUSCULAR | Status: DC | PRN
  Administered 2024-03-29: 2400 [IU]

## 2024-03-29 NOTE — Progress Notes (Signed)
 [name] NEPHROLOGY PROGRESS NOTE  Assessment/ Plan: Pt is a 83 y.o. 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    # Acute kidney injury on CKD IIIa with severe uremic encephalopathy, hyperkalemia and acidosis: Likely due to combination of reduced effective circulatory volume caused by recent paracentesis, use of spironolactone , diuretics and HRS. It seems like the dose of Aldactone  and diuretics increased recently to manage recurrent ascites.  Kidney US  with no hydro. He started dialysis on 9/13 for severe uremic encephalopathy and multiple metabolic abnormalities.  Reportedly he was at his normal mental status until about a week ago.   -------------- - Assess dialysis needs daily - Plan for HD on 9/20, Sat - Had some response to lasix  on 9/18 - Agree with continuing midodrine  and octreotide . - assess diuretic needs daily.  Plan for lasix  on 9/21 - Remain off of Aldactone   - Ordered strict ins/outs, daily weights  - Note that he may not be a good candidate for long-term outpatient dialysis.  We have thoughtfully discussed this.  Continuing to monitor for renal recovery at this time   # Hyperkalemia due to AKI, Aldactone :  - Improved with HD   # Severe metabolic acidosis with AKI:  - improved with renal replacement therapy as above   # Presumed sepsis, unknown source:  - Blood cultures 9/13 negative  - s/p abx per primary team (ceftriaxone  course appears complete)   # History of liver cirrhosis, ascites, hypoalbuminemia requiring frequent paracentesis.  Seen by GI. on Xifaxan .  # Normocytic Anemia - some component of dilution after receiving fluid in ER.  Per charting, he looked dry and dehydrated on presentation.  - iron   deficiency - started oral iron  - Transfuse as needed per primary team  - Would initiate aranesp if drops    Disposition - continue inpatient monitoring      Subjective:  There was no urine output charted over 9/18.  Last HD on 9/17 with 200 mL UF removed - he didn't tolerate the goal of 0.5 kg - 1 liter due to hypotension.  His wife had previously noticed some confusion.  Spoke with his daughter at bedside and she called his wife via speakerphone.   Review of systems:       He states that his breathing is better than prior and notes that his breathing is not good at his recent baseline; no chest pain  No n/v   Objective Vital signs in last 24 hours: Vitals:   03/29/24 0740 03/29/24 0753 03/29/24 0810 03/29/24 1108  BP: 103/70 94/65  94/71  Pulse:  91 84 89  Resp: 16 18 18 18   Temp:  98.5 F (36.9 C)  97.6 F (36.4 C)  TempSrc:  Oral  Oral  SpO2:  97% 95% 96%  Weight:      Height:       Weight change: 2.5 kg  Intake/Output Summary (Last 24 hours) at 03/29/2024 1319 Last data filed at 03/29/2024 0900 Gross per 24 hour  Intake 480 ml  Output --  Net 480 ml       Labs: RENAL PANEL Recent Labs  Lab 03/23/24 0518 03/24/24 0535 03/25/24 0551 03/26/24 0938 03/27/24 0425 03/28/24 0839 03/29/24 0501  NA 136 137 134* 136 137 133* 132*  K 4.6  3.9 3.7 4.4 4.0 4.5 4.4  CL 106 103 101 100 97* 101 98  CO2 16* 20* 20* 19* 23 19* 20*  GLUCOSE 102* 98 167* 129* 91 125* 103*  BUN 108* 63* 73* 78* 43* 66* 65*  CREATININE 2.44* 1.94* 2.40* 2.81* 2.15* 3.00* 3.17*  CALCIUM  8.4* 8.3* 8.3* 8.9 8.3* 8.0* 7.9*  PHOS 5.0* 3.3 3.0  --   --  4.0 4.7*  ALBUMIN  2.4* 2.2*  --   --  2.1* 1.9* 1.8*    Liver Function Tests: Recent Labs  Lab 03/23/24 0517 03/23/24 0518 03/27/24 0425 03/28/24 0839 03/29/24 0501  AST 34  --  28  --   --   ALT 18  --  16  --   --   ALKPHOS 97  --  123  --   --   BILITOT 0.6  --  0.7  --   --   PROT 5.7*  --  5.4*  --   --   ALBUMIN  2.4*   < >  2.1* 1.9* 1.8*   < > = values in this interval not displayed.   No results for input(s): LIPASE, AMYLASE in the last 168 hours.  Recent Labs  Lab 03/25/24 2206  AMMONIA 32   CBC: Recent Labs    03/24/24 0535 03/25/24 0551 03/26/24 0938 03/27/24 0425 03/29/24 0501  HGB 9.2* 9.8* 10.0* 9.5* 9.6*  MCV 87.5 86.4 88.6 89.1 88.2  FERRITIN 407*  --   --   --   --   TIBC 104*  --   --   --   --   IRON  12*  --   --   --   --     Cardiac Enzymes: No results for input(s): CKTOTAL, CKMB, CKMBINDEX, TROPONINI in the last 168 hours. CBG: Recent Labs  Lab 03/28/24 0650 03/28/24 1240 03/29/24 0048 03/29/24 0657 03/29/24 1104  GLUCAP 127* 191* 115* 96 225*    Iron  Studies:  No results for input(s): IRON , TIBC, TRANSFERRIN, FERRITIN in the last 72 hours.   Studies/Results: No results found.    Medications: Infusions:    Scheduled Medications:  atorvastatin   40 mg Oral Daily   Chlorhexidine  Gluconate Cloth  6 each Topical Q0600   colesevelam   1,875 mg Oral BID WC   feeding supplement (NEPRO CARB STEADY)  237 mL Oral BID BM   fluticasone  furoate-vilanterol  1 puff Inhalation Daily   folic acid   1 mg Oral Daily   iron  polysaccharides  150 mg Oral Daily   midodrine   10 mg Oral TID WC   multivitamin  1 tablet Oral QHS   octreotide   100 mcg Subcutaneous Q8H   mouth rinse  15 mL Mouth Rinse 4 times per day   pantoprazole   40 mg Oral BID AC   polyethylene glycol  17 g Oral Daily   polymixin-bacitracin    Topical Daily   rifaximin   550 mg Oral BID   sodium chloride  flush  3 mL Intravenous Q12H   thiamine   100 mg Oral Daily    have reviewed scheduled and prn medications.   Physical Exam:        General elderly male in bed in no acute distress, chronically ill-appearing  HEENT normocephalic atraumatic extraocular movements intact sclera anicteric Neck supple trachea midline Lungs clear to auscultation bilaterally normal work of breathing at rest on  room air Heart S1S2 no rub Abdomen soft distended and non-tender Extremities no pitting edema  Psych normal mood and  affect Neuro alert and conversant Access RIJ nontunneled dialysis catheter      Katheryn Darren [name] 03/29/2024, 1:39 PM  LOS: 7 days

## 2024-03-29 NOTE — Plan of Care (Signed)
  Problem: Clinical Measurements: Goal: Respiratory complications will improve Outcome: Progressing Goal: Cardiovascular complication will be avoided Outcome: Progressing   Problem: Nutrition: Goal: Adequate nutrition will be maintained Outcome: Progressing   Problem: Nutritional: Goal: Ability to make healthy dietary choices will improve Outcome: Progressing   Problem: Cardiac: Goal: Ability to achieve and maintain adequate cardiopulmonary perfusion will improve Outcome: Progressing   Problem: Activity: Goal: Ability to tolerate increased activity will improve Outcome: Progressing   Problem: Cardiac: Goal: Ability to achieve and maintain adequate cardiopulmonary perfusion will improve Outcome: Progressing

## 2024-03-29 NOTE — Progress Notes (Signed)
 Speech Language Pathology Treatment: Cognitive-Linguistic  Patient Details Name: Darren [name] MRN: [id number] DOB: 1940-09-15 Today's Date: 03/29/2024 Time: 8571-8550 SLP Time Calculation (min) (ACUTE ONLY): 21 min  Assessment / Plan / Recommendation Clinical Impression  Patient seen by SLP for skilled treatment focused on cognitive-linguistic goals. His spouse was in the room as well. She indicated that the further patient is away from his HD, the more confused he gets.  His voice was weak and hoarse. His spouse also stated that his voice has declined since this past June and although he used to do three crossword puzzles a day, he hasn't done this since August. Patient participated in completing some subtests from RIPA-2. He was correct on 4 of 10 questions in Immediate Memory subtest, 7 of 10 correct on Temporal Orientation (Recent Memory). He was oriented to month, year, place, but incorrect on date and day of week, stating it was Monday the 15th. SLP will plan to f/u at least one more time but do not anticipate skilled services at time of discharge from hospital.   HPI HPI: Darren [name] is a 83 y.o. male with medical history significant of hypertension, CKD, atrial fibrillation, anemia, diastolic CHF, OSA, obesity, COPD, cirrhosis with ascites presenting with worsening mental status.     History obtained with assistance of chart review and family.  Patient has had 5 days of worsening cognitive functioning and now has difficulty ambulating with significant confusion.  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     Has history of cirrhosis and has been getting paracentesis.  Last paracentesis was a week ago on 9/4 and had 5.3 L removed.  Did see a nephrologist yesterday per chart review and there was concern about his worsening renal function and he was started on increased dose of spironolactone  with increased frequency of Lasix .     Continue to have  worsening status and so present to the ED on 03/22/24.      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

## 2024-03-29 NOTE — Progress Notes (Signed)
 PROGRESS NOTE  Darren [name]  FMW:[id number] DOB: 07/27/1940 DOA: 03/22/2024 PCP: [name], MD  Consultants  Brief Narrative: 83 y.o. male with medical history significant of hypertension, CKD, atrial fibrillation, anemia, diastolic CHF, OSA, obesity, COPD, ongoing alcohol  abuse, cirrhosis with ascites presented with worsening mental status for 5 days prior to coming followed by difficulty ambulating. Patient is a retired Acupuncturist and very sharp at baseline per family. Has history of cirrhosis secondary to alcohol  abuse and has been getting paracentesis.  Last paracentesis was week prior to admission 9/4 and had 5.3 L removed.  Did see a nephrologist a day prior, per chart review and there was concern about his worsening renal function and he was started on increased dose of spironolactone  with increased frequency of Lasix .  Presented to ED due to worsening symptoms.  Admitted for the same.  Since admission he has undergone paracenteses as well as ongoing dialysis.  Changes in mental status deemed likely secondary to uremic encephalopathy.  Slowly improving from mental status standpoint, neurochanges creatinine.   Assessment & Plan: Acute uremic encephalopathy/AKI on CKD stage IIIa/hyperkalemia/metabolic acidosis/hyponatremia, POA: - Baseline creatinine around 1.3-1.4 in September 2024.  Presented with creatinine of 3.34 which peaked at 3.8 along with progressive decline in mental status for past 5 days now with decreased ability to ambulate and significant confusion.  Likely HRS. - Resolving s/p HD.   - Greatly appreciate renal input.  Plan is for repeat HD today 9/20. -Remains off Aldactone . - No urine thus far to today as well as yesterday.  Did have some urinary output with Lasix  9/18.  Plan is to repeat Lasix  per nephrology tomorrow 9/21. -Also of note patient may not be a good candidate for long-term outpatient dialysis.   Acute hypoxic respiratory failure secondary to fluid  overload:  - Patient developed crackles and he was tachypneic on the morning of 03/26/2024.  Nephrology was informed, urgent dialysis was arranged.  Also IR was consulted, therapeutic paracentesis was completed with removal of 4.1 L fluid.  He feels much better without any shortness of breath and he is on room air now. -Abdominal distention slowly recurring.  No overt need for paracentesis today, will continue to reassess daily..   Patient with decompensated liver cirrhosis here with severe sepsis due to possible SBP, POA: SIRS Met severe sepsis criteria based on tachycardia, leukocytosis and encephalopathy as well as AKI on CKD.   - Underwent diagnostic paracentesis by critical care team, patient started on Flagyl , Rocephin  and vancomycin  fluid analysis indicative of SBP so Flagyl  and vancomycin  discontinued 03/23/2024.  Patient's INR 2.5 on 03/23/2024, Eliquis  discontinued 03/24/2024 per GI recommendations.  Continue midodrine , octreotide  and PPI.  Patient appeared to have worsening ascites but no tenderness and he was in respiratory distress, IR was consulted, paracentesis with yield of 4.1 L completed on 03/26/2024.   - Continues to appear comfortable. -Antibiotics stop date 9/20 today   Hypertension - Holding antihypertensives in the setting of low blood pressure and initiation of dialysis.   - BPs remains in 100s to 110s systolic range.  Denies any lightheadedness.   Atrial fibrillation - Holding metoprolol  with low blood pressure.  Rates controlled.  Discontinued Eliquis  as mentioned above.   Acute normocytic anemia, POA: Hemoglobin levels have been labile this hospitalization but now holding steady in the 9-10 range..  No reports of hematochezia, hematemesis or melena.  -Most likely secondary to CKD above.   Diastolic CHF - Echo in 2022 with EF 50-55%, G2  DD, moderate pulmonary regurgitation. - Holding diuretics in the setting of dehydration/AKI/initiation of HD-->trial of lasix  again  tomorrow.   OSA - Continue home CPAP   COPD - Replaced home Dulera  with formulary Breo   Diarrhea:  - Now resolved.  Nutrition Problem: Severe Malnutrition Etiology: chronic illness, poor appetite, lethargy/confusion Signs/Symptoms: severe muscle depletion, severe fat depletion Interventions: Refer to RD note for recommendations Wound 03/22/24 1845 Pressure Injury Sacrum Medial Stage 1 -  Intact skin with non-blanchable redness of a localized area usually over a bony prominence. (Active)   DVT prophylaxis:  - holding in light of bleeding risk w/ elevated INR 2/2 cirrhosis Code Status:   Code Status: Limited: Do not attempt resuscitation (DNR) -DNR-LIMITED -Do Not Intubate/DNI  Family Communication: Pastor present at bedside, pt okay'ed talking with her/sharing medical information.  All questions answered.  Level of care: Progressive Status is: Inpatient  Consults called: Nephrology, palliative care, gastroenterology  Subjective: Patient awake and alert.  No complaints today.  Objective: Vitals:   03/29/24 1108 03/29/24 1539 03/29/24 1600 03/29/24 1603  BP: 94/71 120/70 91/67 102/71  Pulse: 89 90 82 86  Resp: 18 16 15 14   Temp: 97.6 F (36.4 C) 98.3 F (36.8 C) 98.3 F (36.8 C)   TempSrc: Oral     SpO2: 96%  96% 95%  Weight:  76.1 kg    Height:        Intake/Output Summary (Last 24 hours) at 03/29/2024 1632 Last data filed at 03/29/2024 1300 Gross per 24 hour  Intake 580 ml  Output --  Net 580 ml   Filed Weights   03/28/24 0445 03/29/24 0344 03/29/24 1539  Weight: 71.5 kg 74 kg 76.1 kg   Body mass index is 23.4 kg/m.  Gen: 83 y.o. male in no apparent distress.  Nontoxic, elderly, somewhat frail appearing. Pulm: Non-labored breathing.  Clear to auscultation bilaterally.  CV: Regular rate and rhythm. No murmur, rub, or gallop. No JVD GI: Abdomen soft, non-tender minimal distention noted. Ext: Warm, no deformities, bruising bilateral arms from prior IV sticks.   No lower extremity edema. Skin: No rashes, lesions  Neuro: Alert and oriented to person, place, year, conversant.  No notable neurological deficits. Psych: Calm  Judgement and insight appear normal. Mood & affect appropriate.     I have personally reviewed the following labs and images: CBC: Recent Labs  Lab 03/24/24 0535 03/25/24 0551 03/26/24 0938 03/27/24 0425 03/29/24 0501  WBC 10.8* 12.7* 10.4 10.3 15.2*  NEUTROABS  --  11.5* 8.9* 8.9*  --   HGB 9.2* 9.8* 10.0* 9.5* 9.6*  HCT 29.3* 31.2* 32.5* 30.4* 31.5*  MCV 87.5 86.4 88.6 89.1 88.2  PLT 503* 496* 415* 379 384   BMP &GFR Recent Labs  Lab 03/23/24 0518 03/24/24 0535 03/25/24 0551 03/26/24 0938 03/27/24 0425 03/28/24 0839 03/29/24 0501  NA 136 137 134* 136 137 133* 132*  K 4.6 3.9 3.7 4.4 4.0 4.5 4.4  CL 106 103 101 100 97* 101 98  CO2 16* 20* 20* 19* 23 19* 20*  GLUCOSE 102* 98 167* 129* 91 125* 103*  BUN 108* 63* 73* 78* 43* 66* 65*  CREATININE 2.44* 1.94* 2.40* 2.81* 2.15* 3.00* 3.17*  CALCIUM  8.4* 8.3* 8.3* 8.9 8.3* 8.0* 7.9*  PHOS 5.0* 3.3 3.0  --   --  4.0 4.7*   Estimated Creatinine Clearance: 19.5 mL/min (A) (by C-G formula based on SCr of 3.17 mg/dL (H)). Liver & Pancreas: Recent Labs  Lab 03/23/24 [phone]  03/23/24 0518 03/24/24 0535 03/27/24 0425 03/28/24 0839 03/29/24 0501  AST 34  --   --  28  --   --   ALT 18  --   --  16  --   --   ALKPHOS 97  --   --  123  --   --   BILITOT 0.6  --   --  0.7  --   --   PROT 5.7*  --   --  5.4*  --   --   ALBUMIN  2.4* 2.4* 2.2* 2.1* 1.9* 1.8*   No results for input(s): LIPASE, AMYLASE in the last 168 hours.  Recent Labs  Lab 03/25/24 2206  AMMONIA 32   Diabetic: No results for input(s): HGBA1C in the last 72 hours. Recent Labs  Lab 03/28/24 0650 03/28/24 1240 03/29/24 0048 03/29/24 0657 03/29/24 1104  GLUCAP 127* 191* 115* 96 225*   Cardiac Enzymes: No results for input(s): CKTOTAL, CKMB, CKMBINDEX, TROPONINI in the last 168  hours. No results for input(s): PROBNP in the last 8760 hours. Coagulation Profile: Recent Labs  Lab 03/23/24 0517 03/24/24 0921 03/25/24 0855 03/26/24 0938  INR 2.8* 2.8* 2.5* 2.2*   Thyroid Function Tests: No results for input(s): TSH, T4TOTAL, FREET4, T3FREE, THYROIDAB in the last 72 hours. Lipid Profile: No results for input(s): CHOL, HDL, LDLCALC, TRIG, CHOLHDL, LDLDIRECT in the last 72 hours. Anemia Panel: No results for input(s): VITAMINB12, FOLATE, FERRITIN, TIBC, IRON , RETICCTPCT in the last 72 hours. Urine analysis:    Component Value Date/Time   COLORURINE YELLOW 03/23/2024 0320   APPEARANCEUR HAZY (A) 03/23/2024 0320   LABSPEC 1.015 03/23/2024 0320   PHURINE 5.0 03/23/2024 0320   GLUCOSEU NEGATIVE 03/23/2024 0320   HGBUR NEGATIVE 03/23/2024 0320   BILIRUBINUR NEGATIVE 03/23/2024 0320   KETONESUR NEGATIVE 03/23/2024 0320   PROTEINUR NEGATIVE 03/23/2024 0320   UROBILINOGEN 0.2 09/07/2008 1037   NITRITE NEGATIVE 03/23/2024 0320   LEUKOCYTESUR NEGATIVE 03/23/2024 0320   Sepsis Labs: Invalid input(s): PROCALCITONIN, LACTICIDVEN  Microbiology: Recent Results (from the past 240 hours)  Resp panel by RT-PCR (RSV, Flu A&B, Covid) Anterior Nasal Swab     Status: None   Collection Time: 03/22/24  8:30 AM   Specimen: Anterior Nasal Swab  Result Value Ref Range Status   SARS Coronavirus 2 by RT PCR NEGATIVE NEGATIVE Final   Influenza A by PCR NEGATIVE NEGATIVE Final   Influenza B by PCR NEGATIVE NEGATIVE Final    Comment: (NOTE) The Xpert Xpress SARS-CoV-2/FLU/RSV plus assay is intended as an aid in the diagnosis of influenza from Nasopharyngeal swab specimens and should not be used as a sole basis for treatment. Nasal washings and aspirates are unacceptable for Xpert Xpress SARS-CoV-2/FLU/RSV testing.  Fact Sheet for Patients: BloggerCourse.com  Fact Sheet for Healthcare  Providers: SeriousBroker.it  This test is not yet approved or cleared by the [place]  FDA and has been authorized for detection and/or diagnosis of SARS-CoV-2 by FDA under an Emergency Use Authorization (EUA). This EUA will remain in effect (meaning this test can be used) for the duration of the COVID-19 declaration under Section 564(b)(1) of the Act, 21 U.S.C. section 360bbb-3(b)(1), unless the authorization is terminated or revoked.     Resp Syncytial Virus by PCR NEGATIVE NEGATIVE Final    Comment: (NOTE) Fact Sheet for Patients: BloggerCourse.com  Fact Sheet for Healthcare Providers: SeriousBroker.it  This test is not yet approved or cleared by the [place]  FDA and has been authorized for detection and/or  diagnosis of SARS-CoV-2 by FDA under an Emergency Use Authorization (EUA). This EUA will remain in effect (meaning this test can be used) for the duration of the COVID-19 declaration under Section 564(b)(1) of the Act, 21 U.S.C. section 360bbb-3(b)(1), unless the authorization is terminated or revoked.  Performed at [facility], [address], [place], [place] [address]   Blood culture (routine x 2)     Status: None   Collection Time: 03/22/24  9:15 AM   Specimen: BLOOD  Result Value Ref Range Status   Specimen Description BLOOD RIGHT ANTECUBITAL  Final   Special Requests   Final    BOTTLES DRAWN AEROBIC AND ANAEROBIC Blood Culture adequate volume   Culture   Final    NO GROWTH 5 DAYS Performed at [facility], [address] [place], [place] [address]    Report Status 03/27/2024 FINAL  Final  Blood culture (routine x 2)     Status: None   Collection Time: 03/22/24  9:20 AM   Specimen: BLOOD  Result Value Ref Range Status   Specimen Description BLOOD LEFT ANTECUBITAL  Final   Special Requests   Final    BOTTLES DRAWN AEROBIC AND ANAEROBIC Blood Culture adequate  volume   Culture   Final    NO GROWTH 5 DAYS Performed at [facility], [address] [place], [place] [address]    Report Status 03/27/2024 FINAL  Final  Body fluid culture w Gram Stain     Status: None   Collection Time: 03/22/24 12:25 PM   Specimen: Peritoneal Washings; Peritoneal Fluid  Result Value Ref Range Status   Specimen Description PERITONEAL  Final   Special Requests NONE  Final   Gram Stain RARE WBC SEEN NO ORGANISMS SEEN   Final   Culture   Final    NO GROWTH 3 DAYS Performed at [facility], [address], [place], [place] [address]    Report Status 03/25/2024 FINAL  Final  MRSA Next Gen by PCR, Nasal     Status: None   Collection Time: 03/22/24  7:21 PM   Specimen: Nasal Mucosa; Nasal Swab  Result Value Ref Range Status   MRSA by PCR Next Gen NOT DETECTED NOT DETECTED Final    Comment: (NOTE) The GeneXpert MRSA Assay (FDA approved for NASAL specimens only), is one component of a comprehensive MRSA colonization surveillance program. It is not intended to diagnose MRSA infection nor to guide or monitor treatment for MRSA infections. Test performance is not FDA approved in patients less than 3 years old. Performed at [facility], [address] [place], [place] [address]     Radiology Studies: No results found.  Scheduled Meds:  atorvastatin   40 mg Oral Daily   Chlorhexidine  Gluconate Cloth  6 each Topical Q0600   colesevelam   1,875 mg Oral BID WC   feeding supplement (NEPRO CARB STEADY)  237 mL Oral BID BM   fluticasone  furoate-vilanterol  1 puff Inhalation Daily   folic acid   1 mg Oral Daily   [START ON 03/30/2024] furosemide   80 mg Intravenous Once   iron  polysaccharides  150 mg Oral Daily   midodrine   10 mg Oral TID WC   multivitamin  1 tablet Oral QHS   octreotide   100 mcg Subcutaneous Q8H   mouth rinse  15 mL Mouth Rinse 4 times per day   pantoprazole   40 mg Oral BID AC   polyethylene glycol  17  g Oral Daily    polymixin-bacitracin    Topical Daily   rifaximin   550 mg Oral BID   sodium chloride  flush  3 mL Intravenous Q12H   thiamine   100 mg Oral Daily   Continuous Infusions:  anticoagulant sodium citrate        LOS: 7 days   35 minutes with more than 50% spent in reviewing records, counseling patient/family and coordinating care.  Reyes VEAR Gaw, MD [facility] [email or web address] 03/29/2024, 4:32 PM

## 2024-03-30 DIAGNOSIS — N189 Chronic kidney disease, unspecified: Secondary | ICD-10-CM | POA: Diagnosis not present

## 2024-03-30 DIAGNOSIS — N179 Acute kidney failure, unspecified: Secondary | ICD-10-CM | POA: Diagnosis not present

## 2024-03-30 LAB — CBC
HCT: 32.4 % — ABNORMAL LOW (ref 39.0–52.0)
Hemoglobin: 10.2 g/dL — ABNORMAL LOW (ref 13.0–17.0)
MCH: 27.5 pg (ref 26.0–34.0)
MCHC: 31.5 g/dL (ref 30.0–36.0)
MCV: 87.3 fL (ref 80.0–100.0)
Platelets: 364 K/uL (ref 150–400)
RBC: 3.71 MIL/uL — ABNORMAL LOW (ref 4.22–5.81)
RDW: 17.2 % — ABNORMAL HIGH (ref 11.5–15.5)
WBC: 15.6 K/uL — ABNORMAL HIGH (ref 4.0–10.5)
nRBC: 0 % (ref 0.0–0.2)

## 2024-03-30 LAB — RENAL FUNCTION PANEL
Albumin: 1.7 g/dL — ABNORMAL LOW (ref 3.5–5.0)
Anion gap: 13 (ref 5–15)
BUN: 36 mg/dL — ABNORMAL HIGH (ref 8–23)
CO2: 22 mmol/L (ref 22–32)
Calcium: 7.8 mg/dL — ABNORMAL LOW (ref 8.9–10.3)
Chloride: 97 mmol/L — ABNORMAL LOW (ref 98–111)
Creatinine, Ser: 2.36 mg/dL — ABNORMAL HIGH (ref 0.61–1.24)
GFR, Estimated: 27 mL/min — ABNORMAL LOW (ref >60)
Glucose, Bld: 108 mg/dL — ABNORMAL HIGH (ref 70–99)
Phosphorus: 4.2 mg/dL (ref 2.5–4.6)
Potassium: 4 mmol/L (ref 3.5–5.1)
Sodium: 132 mmol/L — ABNORMAL LOW (ref 135–145)

## 2024-03-30 LAB — GLUCOSE, CAPILLARY
Glucose-Capillary: 105 mg/dL — ABNORMAL HIGH (ref 70–99)
Glucose-Capillary: 153 mg/dL — ABNORMAL HIGH (ref 70–99)
Glucose-Capillary: 170 mg/dL — ABNORMAL HIGH (ref 70–99)
Glucose-Capillary: 205 mg/dL — ABNORMAL HIGH (ref 70–99)

## 2024-03-30 MED ORDER — MIDODRINE HCL 5 MG PO TABS
15.0000 mg | ORAL_TABLET | Freq: Three times a day (TID) | ORAL | Status: DC
  Administered 2024-03-30 – 2024-04-01 (×8): 15 mg via ORAL
  Filled 2024-03-30 (×9): qty 3

## 2024-03-30 NOTE — Progress Notes (Signed)
 PROGRESS NOTE  ACIE CUSTIS  FMW:[id number] DOB: Dec 12, 1940 DOA: 03/22/2024 PCP: [name], MD  Consultants  Brief Narrative: 83 y.o. male with medical history significant of hypertension, CKD, atrial fibrillation, anemia, diastolic CHF, OSA, obesity, COPD, ongoing alcohol  abuse, cirrhosis with ascites presented with worsening mental status for 5 days prior to coming followed by difficulty ambulating. Patient is a retired Acupuncturist and very sharp at baseline per family. Has history of cirrhosis secondary to alcohol  abuse and has been getting paracentesis.  Last paracentesis was week prior to admission 9/4 and had 5.3 L removed.  Did see a nephrologist a day prior, per chart review and there was concern about his worsening renal function and he was started on increased dose of spironolactone  with increased frequency of Lasix .  Presented to ED due to worsening symptoms.  Admitted for the same.  Since admission he has undergone paracenteses as well as ongoing dialysis.  Changes in mental status deemed likely secondary to uremic encephalopathy.  Slowly improving from mental status standpoint, now following creatinine.   Assessment & Plan: Acute uremic encephalopathy/AKI on CKD stage IIIa/hyperkalemia/metabolic acidosis/hyponatremia, POA: - Baseline creatinine around 1.3-1.4 in September 2024.  Presented with creatinine of 3.34 which peaked at 3.8 along with progressive decline in mental status for past 5 days now with decreased ability to ambulate and significant confusion.  Likely HRS. - Resolving s/p HD.   - Greatly appreciate renal input.  Plan is for repeat HD today 9/20. -Remains off Aldactone . - Status post dialysis yesterday.  Did have drop in his creatinine today. - 80 mg IV Lasix  once today.  Assess diuretic needs daily.  He has produced some urine today. -Stop Peritrate per nephrology.  Continue midodrine .  His blood pressure has been low but better today than it has.   Acute  hypoxic respiratory failure secondary to fluid overload:  - Patient developed crackles and he was tachypneic on the morning of 03/26/2024.  Nephrology was informed, urgent dialysis was arranged.  Also IR was consulted, therapeutic paracentesis was completed with removal of 4.1 L fluid.  He feels much better without any shortness of breath and he is on room air now. -Abdominal distention slowly recurring.  No overt need for paracentesis today, will continue to reassess daily--> not much change in abdominal exam today versus yesterday.   Patient with decompensated liver cirrhosis here with severe sepsis due to possible SBP, POA: SIRS Met severe sepsis criteria based on tachycardia, leukocytosis and encephalopathy as well as AKI on CKD.   - Underwent diagnostic paracentesis by critical care team, patient started on Flagyl , Rocephin  and vancomycin  fluid analysis indicative of SBP so Flagyl  and vancomycin  discontinued 03/23/2024.  Patient's INR 2.5 on 03/23/2024, Eliquis  discontinued 03/24/2024 per GI recommendations.  Continue midodrine , octreotide  and PPI.  Patient appeared to have worsening ascites but no tenderness and he was in respiratory distress, IR was consulted, paracentesis with yield of 4.1 L completed on 03/26/2024.   - Continues to appear comfortable. -Antibiotics stop date 9/20   Hypertension - Holding antihypertensives in the setting of low blood pressure and initiation of dialysis.   - BPs remains in 100s to 110s systolic range.  Denies any lightheadedness. -On midodrine  as above.   Atrial fibrillation - Holding metoprolol  with low blood pressure.  Rates controlled.  Discontinued Eliquis  as mentioned above.   Acute normocytic anemia, POA: Hemoglobin levels have been labile this hospitalization but now holding steady in the 9-10 range..  No reports of hematochezia, hematemesis  or melena.  -Most likely secondary to CKD above.   Diastolic CHF - Echo in 2022 with EF 50-55%, G2 DD, moderate  pulmonary regurgitation. - Holding diuretics in the setting of dehydration/AKI/initiation of HD-->trial of lasix  again tomorrow.   OSA - Continue home CPAP   COPD - Replaced home Dulera  with formulary Breo   Diarrhea:  - Now resolved.  Nutrition Problem: Severe Malnutrition Etiology: chronic illness, poor appetite, lethargy/confusion Signs/Symptoms: severe muscle depletion, severe fat depletion Interventions: Refer to RD note for recommendations Wound 03/22/24 1845 Pressure Injury Sacrum Medial Stage 1 -  Intact skin with non-blanchable redness of a localized area usually over a bony prominence. (Active)     Wound 03/29/24 2030 Pressure Injury Coccyx Mid Stage 2 -  Partial thickness loss of dermis presenting as a shallow open injury with a red, pink wound bed without slough. (Active)   DVT prophylaxis:  - holding in light of bleeding risk w/ elevated INR 2/2 cirrhosis Code Status:   Code Status: Limited: Do not attempt resuscitation (DNR) -DNR-LIMITED -Do Not Intubate/DNI  Family Communication: Wife, friend, grandchildren present at bedside and all questions answered. Level of care: Progressive Status is: Inpatient  Consults called: Nephrology, palliative care, gastroenterology  Subjective: Patient awake and alert.  No complaints today.  Feels that he is eating a little better, though his family challenges this.  Objective: Vitals:   03/30/24 0800 03/30/24 0814 03/30/24 1000 03/30/24 1130  BP: 113/84  97/77 100/76  Pulse: (!) 45 (!) 105 (!) 114 98  Resp: 15 17 20 17   Temp: 97.7 F (36.5 C)   (!) 97.5 F (36.4 C)  TempSrc: Oral   Oral  SpO2: 100% 100% 100%   Weight:      Height:        Intake/Output Summary (Last 24 hours) at 03/30/2024 1545 Last data filed at 03/29/2024 2030 Gross per 24 hour  Intake 120 ml  Output 500 ml  Net -380 ml   Filed Weights   03/29/24 1539 03/29/24 1918 03/30/24 0535  Weight: 76.1 kg 74.9 kg 74.5 kg   Body mass index is 22.91  kg/m.  Gen: 83 y.o. male in no apparent distress.  Nontoxic, elderly, somewhat frail appearing. Pulm: Non-labored breathing.  Clear to auscultation bilaterally.  CV: Regular rate and rhythm. No murmur, rub, or gallop. No JVD GI: Abdomen soft, non-tender minimal distention noted. Ext: Warm, no deformities, bruising bilateral arms from prior IV sticks.  No lower extremity edema. Skin: No rashes, lesions  Neuro: Alert and oriented to person, place, year, conversant.  No notable neurological deficits. Psych: Calm  Judgement and insight appear normal. Mood & affect appropriate.     I have personally reviewed the following labs and images: CBC: Recent Labs  Lab 03/25/24 0551 03/26/24 0938 03/27/24 0425 03/29/24 0501 03/30/24 0322  WBC 12.7* 10.4 10.3 15.2* 15.6*  NEUTROABS 11.5* 8.9* 8.9*  --   --   HGB 9.8* 10.0* 9.5* 9.6* 10.2*  HCT 31.2* 32.5* 30.4* 31.5* 32.4*  MCV 86.4 88.6 89.1 88.2 87.3  PLT 496* 415* 379 384 364   BMP &GFR Recent Labs  Lab 03/24/24 0535 03/25/24 0551 03/26/24 0938 03/27/24 0425 03/28/24 0839 03/29/24 0501 03/30/24 0322  NA 137 134* 136 137 133* 132* 132*  K 3.9 3.7 4.4 4.0 4.5 4.4 4.0  CL 103 101 100 97* 101 98 97*  CO2 20* 20* 19* 23 19* 20* 22  GLUCOSE 98 167* 129* 91 125* 103* 108*  BUN  63* 73* 78* 43* 66* 65* 36*  CREATININE 1.94* 2.40* 2.81* 2.15* 3.00* 3.17* 2.36*  CALCIUM  8.3* 8.3* 8.9 8.3* 8.0* 7.9* 7.8*  PHOS 3.3 3.0  --   --  4.0 4.7* 4.2   Estimated Creatinine Clearance: 25.9 mL/min (A) (by C-G formula based on SCr of 2.36 mg/dL (H)). Liver & Pancreas: Recent Labs  Lab 03/24/24 0535 03/27/24 0425 03/28/24 0839 03/29/24 0501 03/30/24 0322  AST  --  28  --   --   --   ALT  --  16  --   --   --   ALKPHOS  --  123  --   --   --   BILITOT  --  0.7  --   --   --   PROT  --  5.4*  --   --   --   ALBUMIN  2.2* 2.1* 1.9* 1.8* 1.7*   No results for input(s): LIPASE, AMYLASE in the last 168 hours.  Recent Labs  Lab  03/25/24 2206  AMMONIA 32   Diabetic: No results for input(s): HGBA1C in the last 72 hours. Recent Labs  Lab 03/29/24 0048 03/29/24 0657 03/29/24 1104 03/30/24 0047 03/30/24 1210  GLUCAP 115* 96 225* 105* 205*   Cardiac Enzymes: No results for input(s): CKTOTAL, CKMB, CKMBINDEX, TROPONINI in the last 168 hours. No results for input(s): PROBNP in the last 8760 hours. Coagulation Profile: Recent Labs  Lab 03/24/24 0921 03/25/24 0855 03/26/24 0938  INR 2.8* 2.5* 2.2*   Thyroid Function Tests: No results for input(s): TSH, T4TOTAL, FREET4, T3FREE, THYROIDAB in the last 72 hours. Lipid Profile: No results for input(s): CHOL, HDL, LDLCALC, TRIG, CHOLHDL, LDLDIRECT in the last 72 hours. Anemia Panel: No results for input(s): VITAMINB12, FOLATE, FERRITIN, TIBC, IRON , RETICCTPCT in the last 72 hours. Urine analysis:    Component Value Date/Time   COLORURINE YELLOW 03/23/2024 0320   APPEARANCEUR HAZY (A) 03/23/2024 0320   LABSPEC 1.015 03/23/2024 0320   PHURINE 5.0 03/23/2024 0320   GLUCOSEU NEGATIVE 03/23/2024 0320   HGBUR NEGATIVE 03/23/2024 0320   BILIRUBINUR NEGATIVE 03/23/2024 0320   KETONESUR NEGATIVE 03/23/2024 0320   PROTEINUR NEGATIVE 03/23/2024 0320   UROBILINOGEN 0.2 09/07/2008 1037   NITRITE NEGATIVE 03/23/2024 0320   LEUKOCYTESUR NEGATIVE 03/23/2024 0320   Sepsis Labs: Invalid input(s): PROCALCITONIN, LACTICIDVEN  Microbiology: Recent Results (from the past 240 hours)  Resp panel by RT-PCR (RSV, Flu A&B, Covid) Anterior Nasal Swab     Status: None   Collection Time: 03/22/24  8:30 AM   Specimen: Anterior Nasal Swab  Result Value Ref Range Status   SARS Coronavirus 2 by RT PCR NEGATIVE NEGATIVE Final   Influenza A by PCR NEGATIVE NEGATIVE Final   Influenza B by PCR NEGATIVE NEGATIVE Final    Comment: (NOTE) The Xpert Xpress SARS-CoV-2/FLU/RSV plus assay is intended as an aid in the diagnosis of influenza  from Nasopharyngeal swab specimens and should not be used as a sole basis for treatment. Nasal washings and aspirates are unacceptable for Xpert Xpress SARS-CoV-2/FLU/RSV testing.  Fact Sheet for Patients: BloggerCourse.com  Fact Sheet for Healthcare Providers: SeriousBroker.it  This test is not yet approved or cleared by the [place]  FDA and has been authorized for detection and/or diagnosis of SARS-CoV-2 by FDA under an Emergency Use Authorization (EUA). This EUA will remain in effect (meaning this test can be used) for the duration of the COVID-19 declaration under Section 564(b)(1) of the Act, 21 U.S.C. section 360bbb-3(b)(1), unless the authorization  is terminated or revoked.     Resp Syncytial Virus by PCR NEGATIVE NEGATIVE Final    Comment: (NOTE) Fact Sheet for Patients: BloggerCourse.com  Fact Sheet for Healthcare Providers: SeriousBroker.it  This test is not yet approved or cleared by the [place]  FDA and has been authorized for detection and/or diagnosis of SARS-CoV-2 by FDA under an Emergency Use Authorization (EUA). This EUA will remain in effect (meaning this test can be used) for the duration of the COVID-19 declaration under Section 564(b)(1) of the Act, 21 U.S.C. section 360bbb-3(b)(1), unless the authorization is terminated or revoked.  Performed at [facility], [address] [place], [place] [address]   Blood culture (routine x 2)     Status: None   Collection Time: 03/22/24  9:15 AM   Specimen: BLOOD  Result Value Ref Range Status   Specimen Description BLOOD RIGHT ANTECUBITAL  Final   Special Requests   Final    BOTTLES DRAWN AEROBIC AND ANAEROBIC Blood Culture adequate volume   Culture   Final    NO GROWTH 5 DAYS Performed at [facility], [address], [place], [place] [address]    Report Status 03/27/2024 FINAL  Final   Blood culture (routine x 2)     Status: None   Collection Time: 03/22/24  9:20 AM   Specimen: BLOOD  Result Value Ref Range Status   Specimen Description BLOOD LEFT ANTECUBITAL  Final   Special Requests   Final    BOTTLES DRAWN AEROBIC AND ANAEROBIC Blood Culture adequate volume   Culture   Final    NO GROWTH 5 DAYS Performed at [facility], [address] [place], [place] [address]    Report Status 03/27/2024 FINAL  Final  Body fluid culture w Gram Stain     Status: None   Collection Time: 03/22/24 12:25 PM   Specimen: Peritoneal Washings; Peritoneal Fluid  Result Value Ref Range Status   Specimen Description PERITONEAL  Final   Special Requests NONE  Final   Gram Stain RARE WBC SEEN NO ORGANISMS SEEN   Final   Culture   Final    NO GROWTH 3 DAYS Performed at [facility], [address], [place], [place] [address]    Report Status 03/25/2024 FINAL  Final  MRSA Next Gen by PCR, Nasal     Status: None   Collection Time: 03/22/24  7:21 PM   Specimen: Nasal Mucosa; Nasal Swab  Result Value Ref Range Status   MRSA by PCR Next Gen NOT DETECTED NOT DETECTED Final    Comment: (NOTE) The GeneXpert MRSA Assay (FDA approved for NASAL specimens only), is one component of a comprehensive MRSA colonization surveillance program. It is not intended to diagnose MRSA infection nor to guide or monitor treatment for MRSA infections. Test performance is not FDA approved in patients less than 10 years old. Performed at [facility], [address] [place], [place] [address]     Radiology Studies: No results found.  Scheduled Meds:  atorvastatin   40 mg Oral Daily   Chlorhexidine  Gluconate Cloth  6 each Topical Q0600   colesevelam   1,875 mg Oral BID WC   feeding supplement (NEPRO CARB STEADY)  237 mL Oral BID BM   fluticasone  furoate-vilanterol  1 puff Inhalation Daily   folic acid   1 mg Oral Daily   iron  polysaccharides  150 mg Oral Daily   midodrine   15 mg Oral  TID WC  multivitamin  1 tablet Oral QHS   mouth rinse  15 mL Mouth Rinse 4 times per day   pantoprazole   40 mg Oral BID AC   polyethylene glycol  17 g Oral Daily   polymixin-bacitracin    Topical Daily   rifaximin   550 mg Oral BID   sodium chloride  flush  3 mL Intravenous Q12H   thiamine   100 mg Oral Daily   Continuous Infusions:     LOS: 8 days   35 minutes with more than 50% spent in reviewing records, counseling patient/family and coordinating care.  Reyes VEAR Gaw, MD [facility] [email or web address] 03/30/2024, 3:45 PM

## 2024-03-30 NOTE — Progress Notes (Signed)
 [facility] NEPHROLOGY PROGRESS NOTE  Assessment/ Plan: Pt is a 83 y.o. 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    # Acute kidney injury on CKD IIIa with severe uremic encephalopathy, hyperkalemia and acidosis: Likely due to combination of reduced effective circulatory volume caused by recent paracentesis, use of spironolactone , diuretics and HRS. It seems like the dose of Aldactone  and diuretics increased recently to manage recurrent ascites.  Kidney US  with no hydro. He started dialysis on 9/13 for severe uremic encephalopathy and multiple metabolic abnormalities.  Reportedly, he was at his normal mental status until about a week prior to presentation  -------------- - Assess dialysis needs daily - Lasix  80 mg IV once today and assess diuretic needs daily.  Had been on diuretics at home  - Agree with continuing midodrine  and can stop octreotide . - Remain off of Aldactone   - Ordered strict ins/outs, daily weights  - Note that he may not be a good candidate for long-term outpatient dialysis.  We have thoughtfully discussed this with the patient and his wife.  Continuing to monitor for renal recovery at this time   # Hyperkalemia due to AKI, Aldactone :  - Improved with HD   # Severe metabolic acidosis with AKI:  - improved with renal replacement therapy as above   # Presumed sepsis, unknown source:  - Blood cultures 9/13 negative  - s/p abx per primary team (ceftriaxone  course appears complete)   # History of liver cirrhosis, ascites, hypoalbuminemia requiring frequent paracentesis.  Seen by GI. on Xifaxan .  # Normocytic Anemia - some component of dilution after receiving fluid in ER.  Per charting, he looked dry and dehydrated  on presentation.  - iron  deficiency - started oral iron  - Transfuse as needed per primary team  - Would initiate aranesp if drops    Disposition - continue inpatient monitoring      Subjective:  He had one unmeasured urine void charted over 9/20.  Last HD on 9/20 with 0.5 kg UF.  Spoke with his wife and granddaughter at bedside.  He had the lasix  this AM - states has made some urine.   Review of systems:     He states that his breathing is better than prior and notes that his breathing is not good at his recent baseline; no chest pain  No n/v   Objective Vital signs in last 24 hours: Vitals:   03/30/24 0535 03/30/24 0800 03/30/24 0814 03/30/24 1000  BP: 100/74 113/84  97/77  Pulse: 97 (!) 45 (!) 105 (!) 114  Resp: 18 15 17 20   Temp: (!) 97.5 F (36.4 C) 97.7 F (36.5 C)    TempSrc: Oral Oral    SpO2: 100% 100% 100% 100%  Weight: 74.5 kg     Height:       Weight change: 2.1 kg  Intake/Output Summary (Last 24 hours) at 03/30/2024 1101 Last data filed at 03/29/2024 2030 Gross per 24 hour  Intake 220 ml  Output 500 ml  Net -280 ml       Labs: RENAL PANEL Recent Labs  Lab 03/24/24 0535 03/25/24 0551 03/26/24 0938 03/27/24 0425 03/28/24 0839 03/29/24 0501 03/30/24 0322  NA 137 134* 136 137 133* 132* 132*  K 3.9 3.7 4.4 4.0 4.5 4.4 4.0  CL  103 101 100 97* 101 98 97*  CO2 20* 20* 19* 23 19* 20* 22  GLUCOSE 98 167* 129* 91 125* 103* 108*  BUN 63* 73* 78* 43* 66* 65* 36*  CREATININE 1.94* 2.40* 2.81* 2.15* 3.00* 3.17* 2.36*  CALCIUM  8.3* 8.3* 8.9 8.3* 8.0* 7.9* 7.8*  PHOS 3.3 3.0  --   --  4.0 4.7* 4.2  ALBUMIN  2.2*  --   --  2.1* 1.9* 1.8* 1.7*    Liver Function Tests: Recent Labs  Lab 03/27/24 0425 03/28/24 0839 03/29/24 0501 03/30/24 0322  AST 28  --   --   --   ALT 16  --   --   --   ALKPHOS 123  --   --   --   BILITOT 0.7  --   --   --   PROT 5.4*  --   --   --   ALBUMIN  2.1* 1.9* 1.8* 1.7*   No results for input(s): LIPASE, AMYLASE in  the last 168 hours.  Recent Labs  Lab 03/25/24 2206  AMMONIA 32   CBC: Recent Labs    03/24/24 0535 03/25/24 0551 03/26/24 0938 03/27/24 0425 03/29/24 0501 03/30/24 0322  HGB 9.2* 9.8* 10.0* 9.5* 9.6* 10.2*  MCV 87.5 86.4 88.6 89.1 88.2 87.3  FERRITIN 407*  --   --   --   --   --   TIBC 104*  --   --   --   --   --   IRON  12*  --   --   --   --   --     Cardiac Enzymes: No results for input(s): CKTOTAL, CKMB, CKMBINDEX, TROPONINI in the last 168 hours. CBG: Recent Labs  Lab 03/28/24 1240 03/29/24 0048 03/29/24 0657 03/29/24 1104 03/30/24 0047  GLUCAP 191* 115* 96 225* 105*    Iron  Studies:  No results for input(s): IRON , TIBC, TRANSFERRIN, FERRITIN in the last 72 hours.   Studies/Results: No results found.    Medications: Infusions:    Scheduled Medications:  atorvastatin   40 mg Oral Daily   Chlorhexidine  Gluconate Cloth  6 each Topical Q0600   colesevelam   1,875 mg Oral BID WC   feeding supplement (NEPRO CARB STEADY)  237 mL Oral BID BM   fluticasone  furoate-vilanterol  1 puff Inhalation Daily   folic acid   1 mg Oral Daily   iron  polysaccharides  150 mg Oral Daily   midodrine   10 mg Oral TID WC   multivitamin  1 tablet Oral QHS   octreotide   100 mcg Subcutaneous Q8H   mouth rinse  15 mL Mouth Rinse 4 times per day   pantoprazole   40 mg Oral BID AC   polyethylene glycol  17 g Oral Daily   polymixin-bacitracin    Topical Daily   rifaximin   550 mg Oral BID   sodium chloride  flush  3 mL Intravenous Q12H   thiamine   100 mg Oral Daily    have reviewed scheduled and prn medications.   Physical Exam:         General elderly male in bed in no acute distress, chronically ill-appearing  HEENT normocephalic atraumatic extraocular movements intact sclera anicteric Neck supple trachea midline Lungs clear to auscultation bilaterally normal work of breathing at rest on room air Heart S1S2 no rub Abdomen soft distended and  non-tender Extremities no pitting edema  Psych normal mood and affect Neuro alert and conversant Access RIJ nontunneled dialysis catheter      Katheryn  C Ercell Perlman 03/30/2024, 11:34 AM  LOS: 8 days

## 2024-03-30 NOTE — Plan of Care (Signed)
  Problem: Education: Goal: Knowledge of General Education information will improve Description: Including pain rating scale, medication(s)/side effects and non-pharmacologic comfort measures Outcome: Progressing   Problem: Health Behavior/Discharge Planning: Goal: Ability to manage health-related needs will improve Outcome: Progressing   Problem: Clinical Measurements: Goal: Ability to maintain clinical measurements within normal limits will improve Outcome: Progressing Goal: Will remain free from infection Outcome: Progressing Goal: Diagnostic test results will improve Outcome: Progressing Goal: Respiratory complications will improve Outcome: Progressing Goal: Cardiovascular complication will be avoided Outcome: Progressing   Problem: Activity: Goal: Risk for activity intolerance will decrease Outcome: Progressing   Problem: Nutrition: Goal: Adequate nutrition will be maintained Outcome: Progressing   Problem: Coping: Goal: Level of anxiety will decrease Outcome: Progressing   Problem: Elimination: Goal: Will not experience complications related to bowel motility Outcome: Progressing Goal: Will not experience complications related to urinary retention Outcome: Progressing   Problem: Pain Managment: Goal: General experience of comfort will improve and/or be controlled Outcome: Progressing   Problem: Safety: Goal: Ability to remain free from injury will improve Outcome: Progressing   Problem: Skin Integrity: Goal: Risk for impaired skin integrity will decrease Outcome: Progressing   Problem: Education: Goal: Knowledge of disease and its progression will improve Outcome: Progressing Goal: Individualized Educational Video(s) Outcome: Progressing   Problem: Fluid Volume: Goal: Compliance with measures to maintain balanced fluid volume will improve Outcome: Progressing   Problem: Health Behavior/Discharge Planning: Goal: Ability to manage health-related needs  will improve Outcome: Progressing   Problem: Nutritional: Goal: Ability to make healthy dietary choices will improve Outcome: Progressing   Problem: Clinical Measurements: Goal: Complications related to the disease process, condition or treatment will be avoided or minimized Outcome: Progressing   Problem: Education: Goal: Ability to demonstrate management of disease process will improve Outcome: Progressing Goal: Ability to verbalize understanding of medication therapies will improve Outcome: Progressing Goal: Individualized Educational Video(s) Outcome: Progressing   Problem: Activity: Goal: Capacity to carry out activities will improve Outcome: Progressing   Problem: Cardiac: Goal: Ability to achieve and maintain adequate cardiopulmonary perfusion will improve Outcome: Progressing   Problem: Education: Goal: Knowledge of disease or condition will improve Outcome: Progressing Goal: Understanding of medication regimen will improve Outcome: Progressing Goal: Individualized Educational Video(s) Outcome: Progressing   Problem: Activity: Goal: Ability to tolerate increased activity will improve Outcome: Progressing   Problem: Cardiac: Goal: Ability to achieve and maintain adequate cardiopulmonary perfusion will improve Outcome: Progressing   Problem: Health Behavior/Discharge Planning: Goal: Ability to safely manage health-related needs after discharge will improve Outcome: Progressing

## 2024-03-31 DIAGNOSIS — N189 Chronic kidney disease, unspecified: Secondary | ICD-10-CM | POA: Diagnosis not present

## 2024-03-31 DIAGNOSIS — N179 Acute kidney failure, unspecified: Secondary | ICD-10-CM | POA: Diagnosis not present

## 2024-03-31 LAB — RENAL FUNCTION PANEL
Albumin: 1.6 g/dL — ABNORMAL LOW (ref 3.5–5.0)
Anion gap: 12 (ref 5–15)
BUN: 49 mg/dL — ABNORMAL HIGH (ref 8–23)
CO2: 23 mmol/L (ref 22–32)
Calcium: 7.8 mg/dL — ABNORMAL LOW (ref 8.9–10.3)
Chloride: 96 mmol/L — ABNORMAL LOW (ref 98–111)
Creatinine, Ser: 3.41 mg/dL — ABNORMAL HIGH (ref 0.61–1.24)
GFR, Estimated: 17 mL/min — ABNORMAL LOW (ref >60)
Glucose, Bld: 128 mg/dL — ABNORMAL HIGH (ref 70–99)
Phosphorus: 5.9 mg/dL — ABNORMAL HIGH (ref 2.5–4.6)
Potassium: 4.3 mmol/L (ref 3.5–5.1)
Sodium: 131 mmol/L — ABNORMAL LOW (ref 135–145)

## 2024-03-31 LAB — CBC
HCT: 31.1 % — ABNORMAL LOW (ref 39.0–52.0)
Hemoglobin: 9.6 g/dL — ABNORMAL LOW (ref 13.0–17.0)
MCH: 27.1 pg (ref 26.0–34.0)
MCHC: 30.9 g/dL (ref 30.0–36.0)
MCV: 87.9 fL (ref 80.0–100.0)
Platelets: 402 K/uL — ABNORMAL HIGH (ref 150–400)
RBC: 3.54 MIL/uL — ABNORMAL LOW (ref 4.22–5.81)
RDW: 17.3 % — ABNORMAL HIGH (ref 11.5–15.5)
WBC: 16.5 K/uL — ABNORMAL HIGH (ref 4.0–10.5)
nRBC: 0 % (ref 0.0–0.2)

## 2024-03-31 LAB — GLUCOSE, CAPILLARY
Glucose-Capillary: 146 mg/dL — ABNORMAL HIGH (ref 70–99)
Glucose-Capillary: 127 mg/dL — ABNORMAL HIGH (ref 70–99)
Glucose-Capillary: 179 mg/dL — ABNORMAL HIGH (ref 70–99)
Glucose-Capillary: 155 mg/dL — ABNORMAL HIGH (ref 70–99)
Glucose-Capillary: 169 mg/dL — ABNORMAL HIGH (ref 70–99)

## 2024-03-31 NOTE — Progress Notes (Signed)
 Admit: 03/22/2024 LOS: 9  57M dialysis dependent AKI on CKD3b from HRS, hypovolemia  Subjective:  Feels better RFP and CBC reviewed, creatinine increased to 3.4 from 2.4, BUN 49, K4.3.  Hemoglobin stable 9.6. Urine output not recorded, just had a pure wick placed Vital signs are stable On room air Last HD 9/20  09/21 0701 - 09/22 0700 In: 360 [P.O.:360] Out: -   Filed Weights   03/29/24 1918 03/30/24 0535 03/31/24 0424  Weight: 74.9 kg 74.5 kg 72.6 kg    Scheduled Meds:  atorvastatin   40 mg Oral Daily   Chlorhexidine  Gluconate Cloth  6 each Topical Q0600   colesevelam   1,875 mg Oral BID WC   feeding supplement (NEPRO CARB STEADY)  237 mL Oral BID BM   fluticasone  furoate-vilanterol  1 puff Inhalation Daily   folic acid   1 mg Oral Daily   iron  polysaccharides  150 mg Oral Daily   midodrine   15 mg Oral TID WC   multivitamin  1 tablet Oral QHS   mouth rinse  15 mL Mouth Rinse 4 times per day   pantoprazole   40 mg Oral BID AC   polyethylene glycol  17 g Oral Daily   polymixin-bacitracin    Topical Daily   rifaximin   550 mg Oral BID   sodium chloride  flush  3 mL Intravenous Q12H   thiamine   100 mg Oral Daily   Continuous Infusions: PRN Meds:.acetaminophen  **OR** acetaminophen , artificial tears, dextrose , haloperidol  lactate, Influenza vac split trivalent PF, mouth rinse, traZODone   Current Labs: reviewed    Physical Exam:  Blood pressure (!) 116/91, pulse 98, temperature 97.8 F (36.6 C), temperature source Oral, resp. rate 20, height 5' 11 (1.803 m), weight 72.6 kg, SpO2 96%. 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 Cirrhosis with large volume ascites and recurrent paracentesis Hyperkalemia: Mild Metabolic acidosis: Resolved Anemia: Normocytic,  labile AMS with uremic encephalopathy contributing: Improved Presence of nontunneled HD catheter  P No dialysis today, will follow urine output now but has a pure wick and he is clinically stable to improved Began discussions about goals of care and meds dialysis is very difficult for patients with liver disease especially at 83 years old Palliative care was consulted Will follow-up again tomorrow before writing dialysis orders Medication Issues; Preferred narcotic agents for pain control are hydromorphone , fentanyl , and methadone. Morphine should not be used.  Baclofen should be avoided Avoid oral sodium phosphate  and magnesium citrate based laxatives / bowel preps   Darren KATHEE [name], MD  03/31/2024, 11:43 AM  Recent Labs  Lab 03/29/24 0501 03/30/24 0322 03/31/24 0441  NA 132* 132* 131*  K 4.4 4.0 4.3  CL 98 97* 96*  CO2 20* 22 23  GLUCOSE 103* 108* 128*  BUN 65* 36* 49*  CREATININE 3.17* 2.36* 3.41*  CALCIUM  7.9* 7.8* 7.8*  PHOS 4.7* 4.2 5.9*   Recent Labs  Lab 03/25/24 0551 03/26/24 0938 03/27/24 0425 03/29/24 0501 03/30/24 0322 03/31/24 0441  WBC 12.7* 10.4 10.3 15.2* 15.6* 16.5*  NEUTROABS 11.5* 8.9* 8.9*  --   --   --   HGB 9.8* 10.0* 9.5* 9.6* 10.2* 9.6*  HCT 31.2* 32.5* 30.4* 31.5* 32.4* 31.1*  MCV 86.4 88.6 89.1 88.2 87.3 87.9  PLT 496* 415* 379 384 364 402*

## 2024-03-31 NOTE — TOC Progression Note (Signed)
 Transition of Care [facility]) - Progression Note    Patient Details  Name: Darren [name] MRN: [id number] Date of Birth: 02/12/1941  Transition of Care [facility]) CM/SW Contact  Isaiah Public, LCSWA Phone Number: 03/31/2024, 12:24 PM  Clinical Narrative:     Patient has SNF bed at [facility] place when medically ready for dc. CSW will continue to follow.  Expected Discharge Plan: Skilled Nursing Facility Barriers to Discharge: Continued Medical Work up               Expected Discharge Plan and Services In-house Referral: Clinical Social Work     Living arrangements for the past 2 months: Single Family Home                                       Social Drivers of Health (SDOH) Interventions SDOH Screenings   Food Insecurity: No Food Insecurity (03/22/2024)  Housing: Low Risk  (03/22/2024)  Transportation Needs: No Transportation Needs (03/22/2024)  Utilities: Not At Risk (03/22/2024)  Social Connections: Patient Unable To Answer (03/22/2024)  Tobacco Use: Medium Risk (03/23/2024)    Readmission Risk Interventions     No data to display

## 2024-03-31 NOTE — Progress Notes (Signed)
 Physical Therapy Treatment Patient Details Name: Darren [name] MRN: [id number] DOB: [birth year] Today's Date: 03/31/2024   History of Present Illness Pt is an 83 yo male who presented 03/22/24 due to AMS. Pt admitted due to severe AKI and sepsis. HD initiated 9/13 due to uremic encephalopathy and multiple metabolic abnormalities. 9/17 s/p therapeutic paracentesis PMH includes: HTN, CKD, afib, anemia, diastolic CHF, OSA, obesity, COPD, liver cirrhosis with ascites for which he gets frequent paracentesis (last on 9/4 with 5.3L removed).    PT Comments  Pt very fatigued to day. Assisted to recliner via Stedy. Continue to feel patient will benefit from continued inpatient follow up therapy, <3 hours/day.      If plan is discharge home, recommend the following: Two people to help with walking and/or transfers;A lot of help with bathing/dressing/bathroom;Assistance with cooking/housework;Direct supervision/assist for medications management;Direct supervision/assist for financial management;Assist for transportation;Help with stairs or ramp for entrance;Supervision due to cognitive status   Can travel by private vehicle     No  Equipment Recommendations  Other (comment) (TBD)    Recommendations for Other Services       Precautions / Restrictions Precautions Precautions: Fall;Other (comment) Recall of Precautions/Restrictions: Impaired Precaution/Restrictions Comments: watch BP     Mobility  Bed Mobility Overal bed mobility: Needs Assistance Bed Mobility: Supine to Sit     Supine to sit: Mod assist     General bed mobility comments: Assist to bring legs off of bed, elevate trunk into sitting and bring hips to EOB.    Transfers Overall transfer level: Needs assistance Equipment used: Ambulation equipment used Transfers: Sit to/from Stand, Bed to chair/wheelchair/BSC Sit to Stand: Min assist, Mod assist, +2 physical assistance           General transfer comment: Assist to  power up and stabilize. Used Stedy for bed to chair due to pt fatigue Transfer via Lift Equipment: Stedy  Ambulation/Gait               General Gait Details: Pt too fatigued   Comptroller Bed    Modified Rankin (Stroke Patients Only)       Balance Overall balance assessment: Needs assistance Sitting-balance support: Feet supported, Bilateral upper extremity supported, Single extremity supported Sitting balance-Leahy Scale: Fair     Standing balance support: Bilateral upper extremity supported Standing balance-Leahy Scale: Poor Standing balance comment: Stedy and min assist for static standing                            Communication Communication Communication: No apparent difficulties  Cognition Arousal: Alert Behavior During Therapy: WFL for tasks assessed/performed   PT - Cognitive impairments: Awareness, Memory, Problem solving, Safety/Judgement                         Following commands: Impaired Following commands impaired: Follows one step commands with increased time    Cueing Cueing Techniques: Verbal cues  Exercises      General Comments        Pertinent Vitals/Pain      Home Living                          Prior Function            PT Goals (current goals can now  be found in the care plan section) Progress towards PT goals: Not progressing toward goals - comment    Frequency    Min 2X/week      PT Plan      Co-evaluation              AM-PAC PT 6 Clicks Mobility   Outcome Measure  Help needed turning from your back to your side while in a flat bed without using bedrails?: A Lot Help needed moving from lying on your back to sitting on the side of a flat bed without using bedrails?: A Lot Help needed moving to and from a bed to a chair (including a wheelchair)?: A Lot Help needed standing up from a chair using your arms (e.g., wheelchair or  bedside chair)?: A Lot Help needed to walk in hospital room?: Total Help needed climbing 3-5 steps with a railing? : Total 6 Click Score: 10    End of Session Equipment Utilized During Treatment: Gait belt Activity Tolerance: Patient limited by fatigue Patient left: in chair;with call bell/phone within reach;with chair alarm set;with family/visitor present Nurse Communication: Mobility status;Need for lift equipment (Can use Stedy if needed) PT Visit Diagnosis: Unsteadiness on feet (R26.81);Other abnormalities of gait and mobility (R26.89);Muscle weakness (generalized) (M62.81)     Time: 8576-8556 PT Time Calculation (min) (ACUTE ONLY): 20 min  Charges:    $Therapeutic Activity: 8-22 mins PT General Charges $$ ACUTE PT VISIT: 1 Visit                     [facility] PT Acute Rehabilitation Services Office [phone]    [name] [facility] 03/31/2024, 4:24 PM

## 2024-03-31 NOTE — Progress Notes (Signed)
 PROGRESS NOTE  Darren [name]  FMW:[id number] DOB: [birth year] DOA: 03/22/2024 PCP: [name], MD  Consultants  Brief Narrative: 83 y.o. male with medical history significant of hypertension, CKD, atrial fibrillation, anemia, diastolic CHF, OSA, obesity, COPD, ongoing alcohol  abuse, cirrhosis with ascites presented with worsening mental status for 5 days prior to coming followed by difficulty ambulating. Patient is a retired Acupuncturist and very sharp at baseline per family. Has history of cirrhosis secondary to alcohol  abuse and has been getting paracentesis.  Last paracentesis was week prior to admission 9/4 and had 5.3 L removed.  Did see a nephrologist a day prior, per chart review and there was concern about his worsening renal function and he was started on increased dose of spironolactone  with increased frequency of Lasix .  Presented to ED due to worsening symptoms.  Admitted for the same.  Since admission he has undergone paracenteses as well as ongoing dialysis.  Changes in mental status deemed likely secondary to uremic encephalopathy.  Slowly improving from mental status standpoint, now following creatinine.   Assessment & Plan: Acute uremic encephalopathy/AKI on CKD stage IIIa/hyperkalemia/metabolic acidosis/hyponatremia, POA: - Baseline creatinine around 1.3-1.4 in September 2024.  Presented with creatinine of 3.34 which peaked at 3.8 along with progressive decline in mental status for past 5 days now with decreased ability to ambulate and significant confusion.  Likely HRS. - With some urine output today. - No dialysis planned for today. - Appreciate nephrology input. -Family remains very involved.   Acute hypoxic respiratory failure secondary to fluid overload:  - Patient developed crackles and he was tachypneic on the morning of 03/26/2024.  Nephrology was informed, urgent dialysis was arranged.  Also IR was consulted, therapeutic paracentesis was completed with removal of  4.1 L fluid.  He feels much better without any shortness of breath and he is on room air now. -Abdominal distention slowly recurring.  No overt need for paracentesis today, will continue to reassess daily - Remains stable and off of oxygen.  No respiratory distress.   Patient with decompensated liver cirrhosis here with severe sepsis due to possible SBP, POA: SIRS Met severe sepsis criteria based on tachycardia, leukocytosis and encephalopathy as well as AKI on CKD.   - Underwent diagnostic paracentesis by critical care team, patient started on Flagyl , Rocephin  and vancomycin  fluid analysis indicative of SBP so Flagyl  and vancomycin  discontinued 03/23/2024.  Patient's INR 2.5 on 03/23/2024, Eliquis  discontinued 03/24/2024 per GI recommendations.  Continue midodrine , octreotide  and PPI.  Patient appeared to have worsening ascites but no tenderness and he was in respiratory distress, IR was consulted, paracentesis with yield of 4.1 L completed on 03/26/2024.   - Continues to appear comfortable. -Antibiotics stop date 9/20   Hypertension - Holding antihypertensives in the setting of low blood pressure and initiation of dialysis.   - BPs remains in 100s to 110s systolic range.  Denies any lightheadedness. -On midodrine  as above.   Atrial fibrillation - Holding metoprolol  with low blood pressure.  Rates controlled.  Discontinued Eliquis  as mentioned above.   Acute normocytic anemia, POA: Hemoglobin levels have been labile this hospitalization but now holding steady in the 9-10 range..  No reports of hematochezia, hematemesis or melena.  -Most likely secondary to CKD above.   Diastolic CHF - Echo in 2022 with EF 50-55%, G2 DD, moderate pulmonary regurgitation. - Holding diuretics in the setting of dehydration/AKI/initiation of HD-->trial of lasix  again tomorrow.   OSA - Continue home CPAP   COPD - Replaced home  Dulera  with formulary Breo   Diarrhea:  - Now resolved.  Nutrition Problem:  Severe Malnutrition Etiology: chronic illness, poor appetite, lethargy/confusion Signs/Symptoms: severe muscle depletion, severe fat depletion Interventions: Refer to RD note for recommendations Wound 03/22/24 1845 Pressure Injury Sacrum Medial Stage 1 -  Intact skin with non-blanchable redness of a localized area usually over a bony prominence. (Active)     Wound 03/29/24 2030 Pressure Injury Coccyx Mid Stage 2 -  Partial thickness loss of dermis presenting as a shallow open injury with a red, pink wound bed without slough. (Active)   DVT prophylaxis:  - holding in light of bleeding risk w/ elevated INR 2/2 cirrhosis Code Status:   Code Status: Limited: Do not attempt resuscitation (DNR) -DNR-LIMITED -Do Not Intubate/DNI  Family Communication: Wife, friend, grandchildren present at bedside and all questions answered. Level of care: Progressive Status is: Inpatient  Consults called: Nephrology, palliative care, gastroenterology  Subjective: Patient awake and alert.  No complaints today.    Objective: Vitals:   03/31/24 0810 03/31/24 0820 03/31/24 1036 03/31/24 1230  BP:  97/86 (!) 116/91 101/89  Pulse:  97 98 95  Resp:  17 20 18   Temp:  97.6 F (36.4 C) 97.8 F (36.6 C) 97.6 F (36.4 C)  TempSrc:  Oral Oral Oral  SpO2: (!) 9% 96%  96%  Weight:      Height:        Intake/Output Summary (Last 24 hours) at 03/31/2024 1540 Last data filed at 03/30/2024 2200 Gross per 24 hour  Intake 360 ml  Output --  Net 360 ml   Filed Weights   03/29/24 1918 03/30/24 0535 03/31/24 0424  Weight: 74.9 kg 74.5 kg 72.6 kg   Body mass index is 22.33 kg/m.  Gen: 83 y.o. male in no apparent distress.  Nontoxic, elderly, somewhat frail appearing. Pulm: Non-labored breathing.  Clear to auscultation bilaterally.  CV: Regular rate and rhythm. No murmur, rub, or gallop. No JVD GI: Abdomen soft, non-tender some distention noted but still nontender.  Do not feel large enough fluid wave that would  be amenable to paracentesis.   Ext: Warm, no deformities, bruising bilateral arms from prior IV sticks.  No lower extremity edema. Skin: No rashes, lesions  Neuro: Alert and oriented to person, place, year, conversant.  No notable neurological deficits. Psych: Calm  Judgement and insight appear normal. Mood & affect appropriate.     I have personally reviewed the following labs and images: CBC: Recent Labs  Lab 03/25/24 0551 03/26/24 0938 03/27/24 0425 03/29/24 0501 03/30/24 0322 03/31/24 0441  WBC 12.7* 10.4 10.3 15.2* 15.6* 16.5*  NEUTROABS 11.5* 8.9* 8.9*  --   --   --   HGB 9.8* 10.0* 9.5* 9.6* 10.2* 9.6*  HCT 31.2* 32.5* 30.4* 31.5* 32.4* 31.1*  MCV 86.4 88.6 89.1 88.2 87.3 87.9  PLT 496* 415* 379 384 364 402*   BMP &GFR Recent Labs  Lab 03/25/24 0551 03/26/24 0938 03/27/24 0425 03/28/24 0839 03/29/24 0501 03/30/24 0322 03/31/24 0441  NA 134*   < > 137 133* 132* 132* 131*  K 3.7   < > 4.0 4.5 4.4 4.0 4.3  CL 101   < > 97* 101 98 97* 96*  CO2 20*   < > 23 19* 20* 22 23  GLUCOSE 167*   < > 91 125* 103* 108* 128*  BUN 73*   < > 43* 66* 65* 36* 49*  CREATININE 2.40*   < > 2.15* 3.00* 3.17*  2.36* 3.41*  CALCIUM  8.3*   < > 8.3* 8.0* 7.9* 7.8* 7.8*  PHOS 3.0  --   --  4.0 4.7* 4.2 5.9*   < > = values in this interval not displayed.   Estimated Creatinine Clearance: 17.4 mL/min (A) (by C-G formula based on SCr of 3.41 mg/dL (H)). Liver & Pancreas: Recent Labs  Lab 03/27/24 0425 03/28/24 0839 03/29/24 0501 03/30/24 0322 03/31/24 0441  AST 28  --   --   --   --   ALT 16  --   --   --   --   ALKPHOS 123  --   --   --   --   BILITOT 0.7  --   --   --   --   PROT 5.4*  --   --   --   --   ALBUMIN  2.1* 1.9* 1.8* 1.7* 1.6*   No results for input(s): LIPASE, AMYLASE in the last 168 hours.  Recent Labs  Lab 03/25/24 2206  AMMONIA 32   Diabetic: No results for input(s): HGBA1C in the last 72 hours. Recent Labs  Lab 03/30/24 1806 03/30/24 2327  03/31/24 0548 03/31/24 0748 03/31/24 1228  GLUCAP 170* 153* 146* 127* 179*   Cardiac Enzymes: No results for input(s): CKTOTAL, CKMB, CKMBINDEX, TROPONINI in the last 168 hours. No results for input(s): PROBNP in the last 8760 hours. Coagulation Profile: Recent Labs  Lab 03/25/24 0855 03/26/24 0938  INR 2.5* 2.2*   Thyroid Function Tests: No results for input(s): TSH, T4TOTAL, FREET4, T3FREE, THYROIDAB in the last 72 hours. Lipid Profile: No results for input(s): CHOL, HDL, LDLCALC, TRIG, CHOLHDL, LDLDIRECT in the last 72 hours. Anemia Panel: No results for input(s): VITAMINB12, FOLATE, FERRITIN, TIBC, IRON , RETICCTPCT in the last 72 hours. Urine analysis:    Component Value Date/Time   COLORURINE YELLOW 03/23/2024 0320   APPEARANCEUR HAZY (A) 03/23/2024 0320   LABSPEC 1.015 03/23/2024 0320   PHURINE 5.0 03/23/2024 0320   GLUCOSEU NEGATIVE 03/23/2024 0320   HGBUR NEGATIVE 03/23/2024 0320   BILIRUBINUR NEGATIVE 03/23/2024 0320   KETONESUR NEGATIVE 03/23/2024 0320   PROTEINUR NEGATIVE 03/23/2024 0320   UROBILINOGEN 0.2 09/07/2008 1037   NITRITE NEGATIVE 03/23/2024 0320   LEUKOCYTESUR NEGATIVE 03/23/2024 0320   Sepsis Labs: Invalid input(s): PROCALCITONIN, LACTICIDVEN  Microbiology: Recent Results (from the past 240 hours)  Resp panel by RT-PCR (RSV, Flu A&B, Covid) Anterior Nasal Swab     Status: None   Collection Time: 03/22/24  8:30 AM   Specimen: Anterior Nasal Swab  Result Value Ref Range Status   SARS Coronavirus 2 by RT PCR NEGATIVE NEGATIVE Final   Influenza A by PCR NEGATIVE NEGATIVE Final   Influenza B by PCR NEGATIVE NEGATIVE Final    Comment: (NOTE) The Xpert Xpress SARS-CoV-2/FLU/RSV plus assay is intended as an aid in the diagnosis of influenza from Nasopharyngeal swab specimens and should not be used as a sole basis for treatment. Nasal washings and aspirates are unacceptable for Xpert Xpress  SARS-CoV-2/FLU/RSV testing.  Fact Sheet for Patients: BloggerCourse.com  Fact Sheet for Healthcare Providers: SeriousBroker.it  This test is not yet approved or cleared by the [place]  FDA and has been authorized for detection and/or diagnosis of SARS-CoV-2 by FDA under an Emergency Use Authorization (EUA). This EUA will remain in effect (meaning this test can be used) for the duration of the COVID-19 declaration under Section 564(b)(1) of the Act, 21 U.S.C. section 360bbb-3(b)(1), unless the authorization is terminated or  revoked.     Resp Syncytial Virus by PCR NEGATIVE NEGATIVE Final    Comment: (NOTE) Fact Sheet for Patients: BloggerCourse.com  Fact Sheet for Healthcare Providers: SeriousBroker.it  This test is not yet approved or cleared by the [place]  FDA and has been authorized for detection and/or diagnosis of SARS-CoV-2 by FDA under an Emergency Use Authorization (EUA). This EUA will remain in effect (meaning this test can be used) for the duration of the COVID-19 declaration under Section 564(b)(1) of the Act, 21 U.S.C. section 360bbb-3(b)(1), unless the authorization is terminated or revoked.  Performed at [facility], [address], [place], [place] [address]   Blood culture (routine x 2)     Status: None   Collection Time: 03/22/24  9:15 AM   Specimen: BLOOD  Result Value Ref Range Status   Specimen Description BLOOD RIGHT ANTECUBITAL  Final   Special Requests   Final    BOTTLES DRAWN AEROBIC AND ANAEROBIC Blood Culture adequate volume   Culture   Final    NO GROWTH 5 DAYS Performed at [facility], [address], [place], [place] [address]    Report Status 03/27/2024 FINAL  Final  Blood culture (routine x 2)     Status: None   Collection Time: 03/22/24  9:20 AM   Specimen: BLOOD  Result Value Ref Range Status   Specimen  Description BLOOD LEFT ANTECUBITAL  Final   Special Requests   Final    BOTTLES DRAWN AEROBIC AND ANAEROBIC Blood Culture adequate volume   Culture   Final    NO GROWTH 5 DAYS Performed at [facility], [address] [place], [place] [address]    Report Status 03/27/2024 FINAL  Final  Body fluid culture w Gram Stain     Status: None   Collection Time: 03/22/24 12:25 PM   Specimen: Peritoneal Washings; Peritoneal Fluid  Result Value Ref Range Status   Specimen Description PERITONEAL  Final   Special Requests NONE  Final   Gram Stain RARE WBC SEEN NO ORGANISMS SEEN   Final   Culture   Final    NO GROWTH 3 DAYS Performed at [facility], [address] [place], [place] [address]    Report Status 03/25/2024 FINAL  Final  MRSA Next Gen by PCR, Nasal     Status: None   Collection Time: 03/22/24  7:21 PM   Specimen: Nasal Mucosa; Nasal Swab  Result Value Ref Range Status   MRSA by PCR Next Gen NOT DETECTED NOT DETECTED Final    Comment: (NOTE) The GeneXpert MRSA Assay (FDA approved for NASAL specimens only), is one component of a comprehensive MRSA colonization surveillance program. It is not intended to diagnose MRSA infection nor to guide or monitor treatment for MRSA infections. Test performance is not FDA approved in patients less than 43 years old. Performed at [facility], [address] [place], [place] [address]     Radiology Studies: No results found.  Scheduled Meds:  atorvastatin   40 mg Oral Daily   Chlorhexidine  Gluconate Cloth  6 each Topical Q0600   colesevelam   1,875 mg Oral BID WC   feeding supplement (NEPRO CARB STEADY)  237 mL Oral BID BM   fluticasone  furoate-vilanterol  1 puff Inhalation Daily   folic acid   1 mg Oral Daily   iron  polysaccharides  150 mg Oral Daily   midodrine   15 mg Oral TID WC   multivitamin  1 tablet Oral QHS   mouth rinse  15 mL Mouth Rinse 4 times per day   pantoprazole   40 mg Oral BID AC   polyethylene  glycol  17 g Oral Daily   polymixin-bacitracin    Topical Daily   rifaximin   550 mg Oral BID   sodium chloride  flush  3 mL Intravenous Q12H   thiamine   100 mg Oral Daily   Continuous Infusions:     LOS: 9 days   35 minutes with more than 50% spent in reviewing records, counseling patient/family and coordinating care.  Reyes VEAR Gaw, MD [facility] [email or web address] 03/31/2024, 3:40 PM

## 2024-04-01 DIAGNOSIS — N179 Acute kidney failure, unspecified: Secondary | ICD-10-CM | POA: Diagnosis not present

## 2024-04-01 DIAGNOSIS — N189 Chronic kidney disease, unspecified: Secondary | ICD-10-CM | POA: Diagnosis not present

## 2024-04-01 LAB — CBC
HCT: 31.9 % — ABNORMAL LOW (ref 39.0–52.0)
Hemoglobin: 9.9 g/dL — ABNORMAL LOW (ref 13.0–17.0)
MCH: 27.7 pg (ref 26.0–34.0)
MCHC: 31 g/dL (ref 30.0–36.0)
MCV: 89.4 fL (ref 80.0–100.0)
Platelets: 378 K/uL (ref 150–400)
RBC: 3.57 MIL/uL — ABNORMAL LOW (ref 4.22–5.81)
RDW: 17.4 % — ABNORMAL HIGH (ref 11.5–15.5)
WBC: 16.8 K/uL — ABNORMAL HIGH (ref 4.0–10.5)
nRBC: 0 % (ref 0.0–0.2)

## 2024-04-01 LAB — RENAL FUNCTION PANEL
Albumin: 1.6 g/dL — ABNORMAL LOW (ref 3.5–5.0)
Anion gap: 16 — ABNORMAL HIGH (ref 5–15)
BUN: 61 mg/dL — ABNORMAL HIGH (ref 8–23)
CO2: 20 mmol/L — ABNORMAL LOW (ref 22–32)
Calcium: 7.9 mg/dL — ABNORMAL LOW (ref 8.9–10.3)
Chloride: 95 mmol/L — ABNORMAL LOW (ref 98–111)
Creatinine, Ser: 4.26 mg/dL — ABNORMAL HIGH (ref 0.61–1.24)
GFR, Estimated: 13 mL/min — ABNORMAL LOW (ref >60)
Glucose, Bld: 126 mg/dL — ABNORMAL HIGH (ref 70–99)
Phosphorus: 6.8 mg/dL — ABNORMAL HIGH (ref 2.5–4.6)
Potassium: 4.7 mmol/L (ref 3.5–5.1)
Sodium: 131 mmol/L — ABNORMAL LOW (ref 135–145)

## 2024-04-01 LAB — GLUCOSE, CAPILLARY
Glucose-Capillary: 100 mg/dL — ABNORMAL HIGH (ref 70–99)
Glucose-Capillary: 125 mg/dL — ABNORMAL HIGH (ref 70–99)
Glucose-Capillary: 132 mg/dL — ABNORMAL HIGH (ref 70–99)
Glucose-Capillary: 152 mg/dL — ABNORMAL HIGH (ref 70–99)
Glucose-Capillary: 213 mg/dL — ABNORMAL HIGH (ref 70–99)

## 2024-04-01 MED ORDER — MEDIHONEY WOUND/BURN DRESSING EX PSTE
1.0000 | PASTE | Freq: Every day | CUTANEOUS | Status: DC
  Administered 2024-04-01 – 2024-04-02 (×2): 1 via TOPICAL
  Filled 2024-04-01: qty 44

## 2024-04-01 NOTE — Consult Note (Addendum)
 WOC Nurse wound follow up Wound type: MDRRPI unstageable. Measurement: 3 cm x 3 cm x depth not able to see, wound entire cover with slough. Wound bed: 100% yellow Drainage (amount, consistency, odor) Minimum amount, no odor, yellow drainage. Periwound: skin intact, erythema, maceration. Dressing procedure/placement/frequency: Cleanse with saline, pat dry the peri-wound skin. Apply Medihoney to the wound bed daily. Cover with foam dressing, ok lift the foam and reapply the Medihoney, the foam can stay up to 3 days if is not saturated or soiling.  Sacrum unstageable pressure injury.   WOC team will follow weekly. Please reconsult if further assistance is needed. Thank-you,  Lela Holm RN, CNS, [employer], MSN.  (Phone [phone])

## 2024-04-01 NOTE — Progress Notes (Signed)
 Palliative Medicine Inpatient Follow Up Note HPI: Darren [name] is a 83 year old male with medical history significant for cirrhosis with abdominal ascites (last paracentesis 9/4, 5.3L removed), atrial fibrillation on Eliquis , CHF, essential hypertension, GERD, and OSA who presented to the ED at [facility] 9/13 with AMS. Darren [name] is presently being treated for hepatorenal syndrome. He is also noted to have significant cirrhosis with an increased MELD. Palliative care has been asked to support GOC conversations.   Today's Discussion 04/01/2024  *Please note that this is a verbal dictation therefore any spelling or grammatical errors are due to the Dragon Medical One system interpretation.  Admitted for encephalopathy. Has been on dialysis since hospitalization.   Chart reviewed inclusive of vital signs, progress notes, laboratory results, and diagnostic images.   I met with Darren [name] and his wife, Darren [name] this morning. We discussed his current health and the conversations that they have had with one another over the last few days. Darren [name] no longer has a want to eat. He and his wife have discussed next steps from here.  We reviewed the idea of hospice. I described hospice as a service for patients who have a life expectancy of 6 months or less. The goal of hospice is the preservation of dignity and quality at the end phases of life. Under hospice care, the focus changes from curative to symptom relief.   Darren [name] would like another paracentesis to support symptoms and one additional dialysis treatment. We reviewed after this is complete pursuing full comfort measures. We talked about transition to comfort measures in house and what that would entail inclusive of medications to control pain, dyspnea, agitation, nausea, itching, and hiccups.  We discussed stopping all uneccessary measures such as cardiac monitoring, blood draws, needle sticks, and frequent vital signs. Darren [name] asserts that the goal  would be to allow comfort and for no additional tx/medications. I emphasized all medicines would be geared towards symptom relief.   Darren [name] and his wife would like for him to transition to [facility] from here. They are aware that his time will be limited once additional treatments and interventions are stopped.   Darren [name] is at peace with his decision though he does note the children will have a difficult time with this.   Utilized reflective listening throughout our time together.   Questions and concerns addressed/Palliative Support Provided.   Objective Assessment: Vital Signs Vitals:   04/01/24 0732 04/01/24 0840  BP:  102/65  Pulse: 76   Resp:  16  Temp:  98 F (36.7 C)  SpO2: 97%     Intake/Output Summary (Last 24 hours) at 04/01/2024 1128 Last data filed at 03/31/2024 2334 Gross per 24 hour  Intake 0 ml  Output --  Net 0 ml   Last Weight  Most recent update: 04/01/2024  5:41 AM    Weight  72.4 kg (159 lb 9.8 oz)            Gen: Elderly Caucasian male chronically ill in appearance HEENT: moist mucous membranes CV: Regular rate and rhythm PULM: Breathing is even and nonlabored on 2 L nasal cannula ABD: soft/nontender EXT: LE edema Neuro: Alert and oriented x3  SUMMARY OF RECOMMENDATIONS   DNAR/DNI   One additional paracentesis and dialysis tx  Transition to full comfort care after the above are pursued  Discharge to [facility] once accepted   The PMT will remain involved ______________________________________________________________________________________ Darren [name] [facility] Palliative Medicine Team Team Cell Phone: [phone] Please utilize secure chat  with additional questions, if there is no response within 30 minutes please call the above phone number  Time: 17  Palliative Medicine Team providers are available by phone from 7am to 7pm daily and can be reached through the team cell phone.  Should this patient require assistance  outside of these hours, please call the patient's attending physician.

## 2024-04-01 NOTE — Plan of Care (Signed)
  Problem: Education: Goal: Knowledge of General Education information will improve Description: Including pain rating scale, medication(s)/side effects and non-pharmacologic comfort measures Outcome: Progressing   Problem: Health Behavior/Discharge Planning: Goal: Ability to manage health-related needs will improve Outcome: Progressing Problem: Clinical Measurements: Goal: Cardiovascular complication will be avoided Outcome: Progressing   Problem: Education: Goal: Understanding of medication regimen will improve Outcome: Progressing   Problem: Health Behavior/Discharge Planning: Goal: Ability to safely manage health-related needs after discharge will improve Outcome: Progressing     Problem: Clinical Measurements: Goal: Will remain free from infection Outcome: Progressing

## 2024-04-01 NOTE — Progress Notes (Signed)
 SLP Cancellation Note  Patient Details Name: Darren [name] MRN: [id number] DOB: 1941/04/26   Cancelled treatment:       Reason Eval/Treat Not Completed: Other (comment) Patient getting nursing assistance. SLP spoke with patient's spouse outside of room to determine if any further want/need for skilled intervention related to his cognitive impairment. His spouse indicated I think he's given up on all that and that she would like to continue discussions with palliative care regarding next steps. Palliative NP arrived and spoke with spouse. SLP to s/o at this time.  Norleen IVAR Blase, MA, CCC-SLP Speech Therapy 04/01/2024, 10:21 AM

## 2024-04-01 NOTE — Progress Notes (Signed)
 PROGRESS NOTE  DAT DERKSEN  FMW:[id number] DOB: [birth year] DOA: 03/22/2024 PCP: [name], MD  Consultants  Brief Narrative: 83 y.o. male with medical history significant of hypertension, CKD, atrial fibrillation, anemia, diastolic CHF, OSA, obesity, COPD, ongoing alcohol  abuse, cirrhosis with ascites presented with worsening mental status for 5 days prior to coming followed by difficulty ambulating. Patient is a retired Acupuncturist and very sharp at baseline per family. Has history of cirrhosis secondary to alcohol  abuse and has been getting paracentesis.  Last paracentesis was week prior to admission 9/4 and had 5.3 L removed.  Did see a nephrologist a day prior, per chart review and there was concern about his worsening renal function and he was started on increased dose of spironolactone  with increased frequency of Lasix .  Presented to ED due to worsening symptoms.  Admitted for the same.  Since admission he has undergone paracenteses as well as ongoing dialysis.  Changes in mental status deemed likely secondary to uremic encephalopathy.  Slowly improving from mental status standpoint, now following creatinine.  9/23: Palliative care met with patient and wife again today.  They now agreed to comfort care/discharged to [facility] after 1 more HD treatment and 1 more paracentesis to allow time for gathering family.   Assessment & Plan: Acute uremic encephalopathy/AKI on CKD stage IIIa/hyperkalemia/metabolic acidosis/hyponatremia, POA: - Baseline creatinine around 1.3-1.4 in September 2024.  Presented with creatinine of 3.34 which peaked at 3.8 along with progressive decline in mental status for past 5 days now with decreased ability to ambulate and significant confusion.  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 - See above.  Patient now would like to to switch to comfort care after dialysis.  HD planned for today 9/23.   Acute hypoxic respiratory failure secondary to fluid overload:  - Patient developed  crackles and he was tachypneic on the morning of 03/26/2024.  Nephrology was informed, urgent dialysis was arranged.  Also IR was consulted, therapeutic paracentesis was completed with removal of 4.1 L fluid.  He feels much better without any shortness of breath and he is on room air now. -Abdominal distention slowly recurring.  No overt need for paracentesis today, will continue to reassess daily - Remains stable and off of oxygen.  No respiratory distress.   Patient with decompensated liver cirrhosis here with severe sepsis due to possible SBP, POA: SIRS Met severe sepsis criteria based on tachycardia, leukocytosis and encephalopathy as well as AKI on CKD.   - Underwent diagnostic paracentesis by critical care team, patient started on Flagyl , Rocephin  and vancomycin  fluid analysis indicative of SBP so Flagyl  and vancomycin  discontinued 03/23/2024.  Patient's INR 2.5 on 03/23/2024, Eliquis  discontinued 03/24/2024 per GI recommendations.  Continue midodrine , octreotide  and PPI.  Patient appeared to have worsening ascites but no tenderness and he was in respiratory distress, IR was consulted, paracentesis with yield of 4.1 L completed on 03/26/2024.   - Continues to appear comfortable. -Antibiotics stop date 9/20  - Paracentesis ordered today 9/23.  Patient back for HD majority of afternoon so likely paracentesis may be pushed off until tomorrow 9/24. - Plan will be discharge to [facility] after final dialysis/final paracentesis  Hypertension - Holding antihypertensives in the setting of low blood pressure and initiation of dialysis.   - BPs remains in 100s to 110s systolic range.  Denies any lightheadedness. -On midodrine  as above. -Has not been switched to full comfort care yet, awaiting paracentesis.   Atrial fibrillation - Holding metoprolol  with low blood pressure.  Rates controlled.  Discontinued Eliquis  as mentioned above.   Acute normocytic anemia, POA: Hemoglobin levels have been labile  this hospitalization but now holding steady in the 9-10 range..  No reports of hematochezia, hematemesis or melena.  -Most likely secondary to CKD above.   Diastolic CHF - Echo in 2022 with EF 50-55%, G2 DD, moderate pulmonary regurgitation. - Holding diuretics in the setting of dehydration/AKI/initiation of HD-->trial of lasix  again tomorrow.   OSA - Continue home CPAP   COPD - Replaced home Dulera  with formulary Breo   Diarrhea:  - Now resolved.  Nutrition Problem: Severe Malnutrition Etiology: chronic illness, poor appetite, lethargy/confusion Signs/Symptoms: severe muscle depletion, severe fat depletion Interventions: Refer to RD note for recommendations Wound 03/22/24 1845 Pressure Injury Sacrum Medial Unstageable - Full thickness tissue loss in which the base of the injury is covered by slough (yellow, tan, gray, green or brown) and/or eschar (tan, brown or black) in the wound bed. (Active)     Wound 03/29/24 2030 Pressure Injury Coccyx Mid Stage 2 -  Partial thickness loss of dermis presenting as a shallow open injury with a red, pink wound bed without slough. (Active)   DVT prophylaxis:  - holding in light of bleeding risk w/ elevated INR 2/2 cirrhosis Code Status:   Code Status: Limited: Do not attempt resuscitation (DNR) -DNR-LIMITED -Do Not Intubate/DNI  Family Communication: Wife, friend, grandchildren present at bedside and all questions answered. Level of care: Progressive Status is: Inpatient  Consults called: Nephrology, palliative care, gastroenterology  Subjective: Patient awake and alert.  No complaints today.  Awaiting dialysis on my examination.  Objective: Vitals:   04/01/24 1142 04/01/24 1430 04/01/24 1445 04/01/24 1500  BP: 99/77 101/75  (!) 89/73  Pulse: 97   (!) 108  Resp: 18 16  (!) 22  Temp: 98.3 F (36.8 C) 97.9 F (36.6 C)    TempSrc: Oral Oral    SpO2: 98% 96%  92%  Weight:   74.7 kg   Height:        Intake/Output Summary (Last 24  hours) at 04/01/2024 1631 Last data filed at 03/31/2024 2334 Gross per 24 hour  Intake 0 ml  Output --  Net 0 ml   Filed Weights   03/31/24 0424 04/01/24 0415 04/01/24 1445  Weight: 72.6 kg 72.4 kg 74.7 kg   Body mass index is 22.97 kg/m.  Gen: 83 y.o. male in no apparent distress.  Nontoxic, elderly, somewhat frail appearing. Pulm: Non-labored breathing.  Clear to auscultation bilaterally.  CV: Regular rate and rhythm. No murmur, rub, or gallop. No JVD GI: Abdomen soft, non-tender some distention noted but still nontender.  Fluid wave palpable on exam today. Ext: Warm, no deformities, bruising bilateral arms from prior IV sticks.  No lower extremity edema. Skin: No rashes, lesions  Neuro: Alert and oriented to person, place, year, conversant.  No notable neurological deficits. Psych: Calm  Judgement and insight appear normal. Mood & affect appropriate.     I have personally reviewed the following labs and images: CBC: Recent Labs  Lab 03/26/24 0938 03/27/24 0425 03/29/24 0501 03/30/24 0322 03/31/24 0441 04/01/24 0432  WBC 10.4 10.3 15.2* 15.6* 16.5* 16.8*  NEUTROABS 8.9* 8.9*  --   --   --   --   HGB 10.0* 9.5* 9.6* 10.2* 9.6* 9.9*  HCT 32.5* 30.4* 31.5* 32.4* 31.1* 31.9*  MCV 88.6 89.1 88.2 87.3 87.9 89.4  PLT 415* 379 384 364 402* 378   BMP &GFR Recent Labs  Lab 03/28/24  9160 03/29/24 0501 03/30/24 0322 03/31/24 0441 04/01/24 0432  NA 133* 132* 132* 131* 131*  K 4.5 4.4 4.0 4.3 4.7  CL 101 98 97* 96* 95*  CO2 19* 20* 22 23 20*  GLUCOSE 125* 103* 108* 128* 126*  BUN 66* 65* 36* 49* 61*  CREATININE 3.00* 3.17* 2.36* 3.41* 4.26*  CALCIUM  8.0* 7.9* 7.8* 7.8* 7.9*  PHOS 4.0 4.7* 4.2 5.9* 6.8*   Estimated Creatinine Clearance: 14.4 mL/min (A) (by C-G formula based on SCr of 4.26 mg/dL (H)). Liver & Pancreas: Recent Labs  Lab 03/27/24 0425 03/28/24 0839 03/29/24 0501 03/30/24 0322 03/31/24 0441 04/01/24 0432  AST 28  --   --   --   --   --   ALT 16  --    --   --   --   --   ALKPHOS 123  --   --   --   --   --   BILITOT 0.7  --   --   --   --   --   PROT 5.4*  --   --   --   --   --   ALBUMIN  2.1* 1.9* 1.8* 1.7* 1.6* 1.6*   No results for input(s): LIPASE, AMYLASE in the last 168 hours.  Recent Labs  Lab 03/25/24 2206  AMMONIA 32   Diabetic: No results for input(s): HGBA1C in the last 72 hours. Recent Labs  Lab 03/31/24 1619 03/31/24 2100 04/01/24 0004 04/01/24 0647 04/01/24 1148  GLUCAP 155* 169* 152* 132* 213*   Cardiac Enzymes: No results for input(s): CKTOTAL, CKMB, CKMBINDEX, TROPONINI in the last 168 hours. No results for input(s): PROBNP in the last 8760 hours. Coagulation Profile: Recent Labs  Lab 03/26/24 0938  INR 2.2*   Thyroid Function Tests: No results for input(s): TSH, T4TOTAL, FREET4, T3FREE, THYROIDAB in the last 72 hours. Lipid Profile: No results for input(s): CHOL, HDL, LDLCALC, TRIG, CHOLHDL, LDLDIRECT in the last 72 hours. Anemia Panel: No results for input(s): VITAMINB12, FOLATE, FERRITIN, TIBC, IRON , RETICCTPCT in the last 72 hours. Urine analysis:    Component Value Date/Time   COLORURINE YELLOW 03/23/2024 0320   APPEARANCEUR HAZY (A) 03/23/2024 0320   LABSPEC 1.015 03/23/2024 0320   PHURINE 5.0 03/23/2024 0320   GLUCOSEU NEGATIVE 03/23/2024 0320   HGBUR NEGATIVE 03/23/2024 0320   BILIRUBINUR NEGATIVE 03/23/2024 0320   KETONESUR NEGATIVE 03/23/2024 0320   PROTEINUR NEGATIVE 03/23/2024 0320   UROBILINOGEN 0.2 09/07/2008 1037   NITRITE NEGATIVE 03/23/2024 0320   LEUKOCYTESUR NEGATIVE 03/23/2024 0320   Sepsis Labs: Invalid input(s): PROCALCITONIN, LACTICIDVEN  Microbiology: Recent Results (from the past 240 hours)  MRSA Next Gen by PCR, Nasal     Status: None   Collection Time: 03/22/24  7:21 PM   Specimen: Nasal Mucosa; Nasal Swab  Result Value Ref Range Status   MRSA by PCR Next Gen NOT DETECTED NOT DETECTED Final    Comment:  (NOTE) The GeneXpert MRSA Assay (FDA approved for NASAL specimens only), is one component of a comprehensive MRSA colonization surveillance program. It is not intended to diagnose MRSA infection nor to guide or monitor treatment for MRSA infections. Test performance is not FDA approved in patients less than 69 years old. Performed at [facility], [address], [place], [place] [address]     Radiology Studies: No results found.  Scheduled Meds:  atorvastatin   40 mg Oral Daily   Chlorhexidine  Gluconate Cloth  6 each Topical Q0600  colesevelam   1,875 mg Oral BID WC   feeding supplement (NEPRO CARB STEADY)  237 mL Oral BID BM   fluticasone  furoate-vilanterol  1 puff Inhalation Daily   folic acid   1 mg Oral Daily   iron  polysaccharides  150 mg Oral Daily   leptospermum manuka honey  1 Application Topical Daily   midodrine   15 mg Oral TID WC   multivitamin  1 tablet Oral QHS   mouth rinse  15 mL Mouth Rinse 4 times per day   pantoprazole   40 mg Oral BID AC   polyethylene glycol  17 g Oral Daily   polymixin-bacitracin    Topical Daily   rifaximin   550 mg Oral BID   sodium chloride  flush  3 mL Intravenous Q12H   thiamine   100 mg Oral Daily   Continuous Infusions:     LOS: 10 days   35 minutes with more than 50% spent in reviewing records, counseling patient/family and coordinating care.  Reyes VEAR Gaw, MD [facility] [email or web address] 04/01/2024, 4:31 PM

## 2024-04-01 NOTE — TOC Progression Note (Signed)
 Transition of Care [facility]) - Progression Note    Patient Details  Name: Darren [name] MRN: [id number] Date of Birth: January 25, 1941  [facility] CM/SW Contact  Isaiah Public, LCSWA Phone Number: 04/01/2024, 11:08 AM  Clinical Narrative:     Rosaline NP with palliative informed team that patient and his wife have spoken. That patient would like one more paracentesis and one more iHD tx then he would like to transition to full comfort measures and go to [facility]. Rosaline NP informed Misty with [facility]. Misty with [facility] plans on following up with patient and his spouse later this afternoon or tomorrow. TOC will continue to follow.     Expected Discharge Plan: Skilled Nursing Facility Barriers to Discharge: Continued Medical Work up               Expected Discharge Plan and Services In-house Referral: Clinical Social Work     Living arrangements for the past 2 months: Single Family Home                                       Social Drivers of Health (SDOH) Interventions SDOH Screenings   Food Insecurity: No Food Insecurity (03/22/2024)  Housing: Low Risk  (03/22/2024)  Transportation Needs: No Transportation Needs (03/22/2024)  Utilities: Not At Risk (03/22/2024)  Social Connections: Patient Unable To Answer (03/22/2024)  Tobacco Use: Medium Risk (03/23/2024)    Readmission Risk Interventions     No data to display

## 2024-04-01 NOTE — Progress Notes (Signed)
 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  No intake/output data recorded.  Filed Weights   03/30/24 0535 03/31/24 0424 04/01/24 0415  Weight: 74.5 kg 72.6 kg 72.4 kg    Scheduled Meds:  atorvastatin   40 mg Oral Daily   Chlorhexidine  Gluconate Cloth  6 each Topical Q0600   colesevelam   1,875 mg Oral BID WC   feeding supplement (NEPRO CARB STEADY)  237 mL Oral BID BM   fluticasone  furoate-vilanterol  1 puff Inhalation Daily   folic acid   1 mg Oral Daily   iron  polysaccharides  150 mg Oral Daily   leptospermum manuka honey  1 Application Topical Daily   midodrine   15 mg Oral TID WC   multivitamin  1 tablet Oral QHS   mouth rinse  15 mL Mouth Rinse 4 times per day   pantoprazole   40 mg Oral BID AC   polyethylene glycol  17 g Oral Daily   polymixin-bacitracin    Topical Daily   rifaximin   550 mg Oral BID   sodium chloride  flush  3 mL Intravenous Q12H   thiamine   100 mg Oral Daily   Continuous Infusions: PRN Meds:.acetaminophen  **OR** acetaminophen , artificial tears, dextrose , haloperidol  lactate, Influenza vac split trivalent PF, mouth rinse, traZODone   Current Labs: reviewed    Physical Exam:  Blood pressure 99/77, pulse 97, temperature 98.3 F (36.8 C), temperature source Oral, resp. rate 18, height 5' 11 (1.803 m), weight 72.4 kg, SpO2 98%. 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 Cirrhosis with large volume ascites and recurrent paracentesis Hyperkalemia: Mild, stable Metabolic acidosis:  Resolved Anemia: Normocytic, labile AMS with uremic encephalopathy contributing: Improved Presence of nontunneled HD catheter  P Additional dialysis treatment today to facilitate transition to inpatient hospice and allow him time for end-of-life matters  Will plan to remove HD catheter tomorrow after dialysis  All questions answered  Medication Issues; Preferred narcotic agents for pain control are hydromorphone , fentanyl , and methadone. Morphine should not be used.  Baclofen should be avoided Avoid oral sodium phosphate  and magnesium citrate based laxatives / bowel preps   Bernardino KATHEE [name], MD  04/01/2024, 1:44 PM  Recent Labs  Lab 03/30/24 0322 03/31/24 0441 04/01/24 0432  NA 132* 131* 131*  K 4.0 4.3 4.7  CL 97* 96* 95*  CO2 22 23 20*  GLUCOSE 108* 128* 126*  BUN 36* 49* 61*  CREATININE 2.36* 3.41* 4.26*  CALCIUM  7.8* 7.8* 7.9*  PHOS 4.2 5.9* 6.8*   Recent Labs  Lab 03/26/24 0938 03/27/24 0425 03/29/24 0501 03/30/24 0322 03/31/24 0441 04/01/24 0432  WBC 10.4 10.3   < > 15.6* 16.5* 16.8*  NEUTROABS 8.9* 8.9*  --   --   --   --   HGB 10.0* 9.5*   < > 10.2* 9.6* 9.9*  HCT 32.5* 30.4*   < > 32.4* 31.1* 31.9*  MCV 88.6 89.1   < > 87.3 87.9 89.4  PLT 415* 379   < > 364 402* 378   < > = values in this interval not  displayed.

## 2024-04-02 ENCOUNTER — Inpatient Hospital Stay (HOSPITAL_COMMUNITY)

## 2024-04-02 DIAGNOSIS — N179 Acute kidney failure, unspecified: Secondary | ICD-10-CM | POA: Diagnosis not present

## 2024-04-02 DIAGNOSIS — N189 Chronic kidney disease, unspecified: Secondary | ICD-10-CM | POA: Diagnosis not present

## 2024-04-02 LAB — RENAL FUNCTION PANEL
Albumin: 1.6 g/dL — ABNORMAL LOW (ref 3.5–5.0)
Anion gap: 13 (ref 5–15)
BUN: 48 mg/dL — ABNORMAL HIGH (ref 8–23)
CO2: 23 mmol/L (ref 22–32)
Calcium: 8 mg/dL — ABNORMAL LOW (ref 8.9–10.3)
Chloride: 97 mmol/L — ABNORMAL LOW (ref 98–111)
Creatinine, Ser: 3.94 mg/dL — ABNORMAL HIGH (ref 0.61–1.24)
GFR, Estimated: 15 mL/min — ABNORMAL LOW (ref >60)
Glucose, Bld: 98 mg/dL (ref 70–99)
Phosphorus: 5.6 mg/dL — ABNORMAL HIGH (ref 2.5–4.6)
Potassium: 4.1 mmol/L (ref 3.5–5.1)
Sodium: 133 mmol/L — ABNORMAL LOW (ref 135–145)

## 2024-04-02 LAB — GLUCOSE, CAPILLARY: Glucose-Capillary: 97 mg/dL (ref 70–99)

## 2024-04-02 MED ORDER — LORAZEPAM 2 MG/ML IJ SOLN: 0.5000 mg | INTRAMUSCULAR | Status: DC | PRN

## 2024-04-02 MED ORDER — OXYCODONE HCL 5 MG PO TABS
5.0000 mg | ORAL_TABLET | ORAL | Status: AC
  Administered 2024-04-02: 5 mg via ORAL
  Filled 2024-04-02: qty 1

## 2024-04-02 MED ORDER — LIDOCAINE-EPINEPHRINE 1 %-1:100000 IJ SOLN
INTRAMUSCULAR | Status: AC
  Filled 2024-04-02: qty 1

## 2024-04-02 MED ORDER — HYDROMORPHONE HCL 1 MG/ML IJ SOLN: 0.5000 mg | INTRAMUSCULAR | Status: DC | PRN

## 2024-04-02 NOTE — Progress Notes (Addendum)
 Palliative Medicine Inpatient Follow Up Note HPI: Darren [name] is a 83 year old male with medical history significant for cirrhosis with abdominal ascites (last paracentesis 9/4, 5.3L removed), atrial fibrillation on Eliquis , CHF, essential hypertension, GERD, and OSA who presented to the ED at [facility] 9/13 with AMS. Darren [name] is presently being treated for hepatorenal syndrome. He is also noted to have significant cirrhosis with an increased MELD. Palliative care has been asked to support GOC conversations.   Today's Discussion 04/02/2024  *Please note that this is a verbal dictation therefore any spelling or grammatical errors are due to the Dragon Medical One system interpretation.  Admitted for encephalopathy. Has been on dialysis since hospitalization. Decisions maded yesterday to shift focus to comfort after one addition iHD treatment and a paracentesis. Family interested in [facility].   Chart reviewed inclusive of vital signs, progress notes, laboratory results, and diagnostic images.   I met with Darren [name] this morning. He is lying in bed in NAD. He arouses when spoken to. He is aware of person, place, and situation. He confirmed the decisions that he had made on 9/23 and notes he accepts the philosophy of care associated with hospice. We reviewed the plan for a paracentesis to relief abdominal tension.  We discussed Psychologist, sport and exercise and transition to [facility] once approved for care during Darren [name]'s end of life journey.  A GOLD DNR has been placed on Darren [name]'s chart.  Orders are now reflective of comfort measures. _______________________ Addendum:  I came back and spoke with patient and his wife late morning to confirm the above. Provided emotional support and education on what to expect in terms of the death and dying process.  Questions and concerns addressed/Palliative Support Provided.   Add time: 17  Objective Assessment: Vital Signs Vitals:   04/02/24  0524 04/02/24 0815  BP: 93/66   Pulse: 97 (!) 102  Resp: 17 19  Temp: 98.6 F (37 C)   SpO2:  94%    Intake/Output Summary (Last 24 hours) at 04/02/2024 0827 Last data filed at 04/01/2024 2100 Gross per 24 hour  Intake 50 ml  Output 0 ml  Net 50 ml   Last Weight  Most recent update: 04/02/2024  5:25 AM    Weight  75.3 kg (166 lb 0.1 oz)             Latest Reference Range & Units 04/02/24 05:09  Sodium 135 - 145 mmol/L 133 (L)  Potassium 3.5 - 5.1 mmol/L 4.1  Chloride 98 - 111 mmol/L 97 (L)  CO2 22 - 32 mmol/L 23  Glucose 70 - 99 mg/dL 98  BUN 8 - 23 mg/dL 48 (H)  Creatinine 9.38 - 1.24 mg/dL 6.05 (H)  Calcium  8.9 - 10.3 mg/dL 8.0 (L)  Anion gap 5 - 15  13  Phosphorus 2.5 - 4.6 mg/dL 5.6 (H)  Albumin  3.5 - 5.0 g/dL 1.6 (L)  (L): Data is abnormally low (H): Data is abnormally high  Gen: Elderly Caucasian male chronically ill in appearance HEENT: moist mucous membranes CV: Regular rate and rhythm PULM: Breathing is even and nonlabored on RA ABD: soft/nontender EXT: LE edema Neuro: Alert and oriented x3  SUMMARY OF RECOMMENDATIONS   DNAR/DNI   Paracentesis this morning  Comfort measures have been placed  Discharge to [facility] once accepted   The PMT will remain involved ______________________________________________________________________________________ Rosaline Becton Leach Palliative Medicine Team Team Cell Phone: [phone] Please utilize secure chat with additional questions, if there is no  response within 30 minutes please call the above phone number  MDM: High  Palliative Medicine Team providers are available by phone from 7am to 7pm daily and can be reached through the team cell phone.  Should this patient require assistance outside of these hours, please call the patient's attending physician.

## 2024-04-02 NOTE — Progress Notes (Signed)
 PT Cancellation Note  Patient Details Name: Darren [name] MRN: [id number] DOB: September 26, 1940   Cancelled Treatment:    Reason Eval/Treat Not Completed: Other (comment). PT SIGNING OFF as patient has decided to transition to comfort care and has kindly asked for no further therapy sessions.  Norene Ames, PT, DPT Acute Rehabilitation Services Secure chat preferred Office #: [phone]    Norene CHRISTELLA Ames 04/02/2024, 8:55 AM

## 2024-04-02 NOTE — Progress Notes (Signed)
 Darren [name] [phone] [facility] hospital liaison note   Referral received from [facility] Isaiah Public for family interest in [facility].   Met with patient and his wife [name] in room to explain services and hospice philosophy and all questions answered.  [facility] is able to accept the patient this afternoon once consents are complete.    RN staff, you may call report at any time to [phone], room is assigned when report is called.  Please leave IV intact and send completed DNR with patient.   Updated attending and [facility] manager via [employer].  Thank you for the opportunity to participate in this patient's care  Amy Darien BSN, RN [facility] Liaison [phone]

## 2024-04-02 NOTE — Plan of Care (Signed)
  Problem: Education: Goal: Knowledge of General Education information will improve Description: Including pain rating scale, medication(s)/side effects and non-pharmacologic comfort measures Outcome: Adequate for Discharge   Problem: Health Behavior/Discharge Planning: Goal: Ability to manage health-related needs will improve Outcome: Adequate for Discharge   Problem: Clinical Measurements: Goal: Ability to maintain clinical measurements within normal limits will improve Outcome: Adequate for Discharge Goal: Will remain free from infection Outcome: Adequate for Discharge Goal: Diagnostic test results will improve Outcome: Adequate for Discharge Goal: Respiratory complications will improve Outcome: Adequate for Discharge Goal: Cardiovascular complication will be avoided Outcome: Adequate for Discharge   Problem: Activity: Goal: Risk for activity intolerance will decrease Outcome: Adequate for Discharge   Problem: Nutrition: Goal: Adequate nutrition will be maintained Outcome: Adequate for Discharge   Problem: Coping: Goal: Level of anxiety will decrease Outcome: Adequate for Discharge   Problem: Elimination: Goal: Will not experience complications related to bowel motility Outcome: Adequate for Discharge Goal: Will not experience complications related to urinary retention Outcome: Adequate for Discharge   Problem: Pain Managment: Goal: General experience of comfort will improve and/or be controlled Outcome: Adequate for Discharge   Problem: Safety: Goal: Ability to remain free from injury will improve Outcome: Adequate for Discharge   Problem: Skin Integrity: Goal: Risk for impaired skin integrity will decrease Outcome: Adequate for Discharge   Problem: Education: Goal: Knowledge of disease and its progression will improve Outcome: Adequate for Discharge Goal: Individualized Educational Video(s) Outcome: Adequate for Discharge   Problem: Fluid Volume: Goal:  Compliance with measures to maintain balanced fluid volume will improve Outcome: Adequate for Discharge   Problem: Health Behavior/Discharge Planning: Goal: Ability to manage health-related needs will improve Outcome: Adequate for Discharge   Problem: Nutritional: Goal: Ability to make healthy dietary choices will improve Outcome: Adequate for Discharge   Problem: Clinical Measurements: Goal: Complications related to the disease process, condition or treatment will be avoided or minimized Outcome: Adequate for Discharge   Problem: Education: Goal: Ability to demonstrate management of disease process will improve Outcome: Adequate for Discharge Goal: Ability to verbalize understanding of medication therapies will improve Outcome: Adequate for Discharge Goal: Individualized Educational Video(s) Outcome: Adequate for Discharge   Problem: Activity: Goal: Capacity to carry out activities will improve Outcome: Adequate for Discharge   Problem: Cardiac: Goal: Ability to achieve and maintain adequate cardiopulmonary perfusion will improve Outcome: Adequate for Discharge   Problem: Education: Goal: Knowledge of disease or condition will improve Outcome: Adequate for Discharge Goal: Understanding of medication regimen will improve Outcome: Adequate for Discharge Goal: Individualized Educational Video(s) Outcome: Adequate for Discharge   Problem: Activity: Goal: Ability to tolerate increased activity will improve Outcome: Adequate for Discharge   Problem: Cardiac: Goal: Ability to achieve and maintain adequate cardiopulmonary perfusion will improve Outcome: Adequate for Discharge   Problem: Health Behavior/Discharge Planning: Goal: Ability to safely manage health-related needs after discharge will improve Outcome: Adequate for Discharge

## 2024-04-02 NOTE — Progress Notes (Signed)
 Nutrition Brief Note  Chart reviewed. Pt now transitioning to comfort care. Regular diet order in place. No further nutrition interventions planned at this time.  Please re-consult as needed.   Darren Eshelman, MS, RD, LDN Clinical Dietitian  Please see AMiON for contact information.

## 2024-04-02 NOTE — Progress Notes (Signed)
 OT Cancellation Note  Patient Details Name: FLAVIUS REPSHER MRN: [id number] DOB: 05-Sep-1940   Cancelled Treatment:    Reason Eval/Treat Not Completed:  (Spoke to family. Pt going comfort care. Acute Occupational Therapy signing off.)  Mattilynn Forrer K OTR/L  Acute Rehab Services  [phone] office number   Warrick Berber 04/02/2024, 8:33 AM

## 2024-04-02 NOTE — Plan of Care (Signed)
  Problem: Clinical Measurements: Goal: Respiratory complications will improve Outcome: Progressing   Problem: Coping: Goal: Level of anxiety will decrease Outcome: Progressing   Problem: Safety: Goal: Ability to remain free from injury will improve Outcome: Progressing   

## 2024-04-02 NOTE — Progress Notes (Signed)
 Patient presented to IR for therapeutic paracentesis. Unable to perform the procedure due to his blood pressures. Pressure was 75/46. Team made aware via EPIC chat.

## 2024-04-02 NOTE — TOC Transition Note (Addendum)
 Transition of Care [facility]) - Discharge Note   Patient Details  Name: Darren [name] MRN: [id number] Date of Birth: [birth year]  [facility] of [facility]) CM/SW Contact:  Isaiah Public, LCSWA Phone Number: 04/02/2024, 3:39 PM   Clinical Narrative:     Patient will DC to: [facility]  Anticipated DC date: 04/02/2024  Family notified: [name]   Transport by: [place]  ?  Per MD patient ready for DC to [facility] . RN, patient, patient's family, Amy with [facility],and facility notified of DC.  RN given number for report [phone]. DC packet on chart. DNR signed by MD attached to patients DC packet.Ambulance transport requested for patient.  CSW signing off.    Final next level of care: [facility]) Barriers to Discharge: No Barriers Identified   Patient Goals and CMS Choice Patient states their goals for this hospitalization and ongoing recovery are:: to go to [email or web address].gov Compare Post Acute Care list provided to:: Patient Choice offered to / list presented to : Patient (Patient and spouse)      Discharge Placement              Patient chooses bed at:  [facility]) Patient to be transferred to facility by: [facility] Name of family member notified: [place] Patient and family notified of of transfer: 04/02/24  Discharge Plan and Services Additional resources added to the After Visit Summary for   In-house Referral: Clinical Social Work                                   Social Drivers of Health (SDOH) Interventions SDOH Screenings   Food Insecurity: No Food Insecurity (03/22/2024)  Housing: Low Risk  (03/22/2024)  Transportation Needs: No Transportation Needs (03/22/2024)  Utilities: Not At Risk (03/22/2024)  Social Connections: Patient Unable To Answer (03/22/2024)  Tobacco Use: Medium Risk (03/23/2024)     Readmission Risk Interventions     No data to display

## 2024-04-02 NOTE — Progress Notes (Signed)
Report given to [facility] RN °

## 2024-04-02 NOTE — Progress Notes (Signed)
 Brief nephrology progress note  Patient tolerated hemodialysis poorly yesterday.  It was stopped early at his request.  Plan to transition to inpatient hospice.  Will arrange for removal of HD catheter.

## 2024-04-02 NOTE — Discharge Summary (Signed)
 Physician Discharge Summary  DUELL HOLDREN FMW:[id number] DOB: 31-Jan-1941 DOA: 03/22/2024  PCP: [name], MD  Admit date: 03/22/2024 Discharge date: 04/02/2024 Recommendations for Outpatient Follow-up:  Follow up with PCP in 1 weeks-call for appointment Please obtain BMP/CBC in one week  Discharge Dispo: [facility] Discharge Condition: Stable Code Status:   Code Status: Do not attempt resuscitation (DNR) - Comfort care Diet recommendation:  Diet Order             Diet general           Diet regular Room service appropriate? Yes with Assist; Fluid consistency: Thin  Diet effective now                    Brief/Interim Summary: Darren [name] is a 83 y.o. male with PMH of  hypertension, CKD, atrial fibrillation, anemia, diastolic CHF, OSA, obesity, COPD, ongoing alcohol  abuse, cirrhosis with ascites presented with worsening mental status for 5 days prior to coming followed by difficulty ambulating. Patient is a retired Acupuncturist and very sharp at baseline per family. Has history of cirrhosis secondary to alcohol  abuse and has been getting paracentesis.Last paracentesis was week PTA 9/4 and had 5.3 L removed. Sen by nephrologist a day prior, per chart review and there was concern about his worsening renal function and he was started on increased dose of spironolactone  with increased frequency of Lasix .  In ZI:tnmdzwpwh symptoms and admitted. Since admission he has undergone paracenteses as well as ongoing dialysis. Changes in mental status deemed likely secondary to uremic encephalopathy.Slowly improving from mental status Perative care has been following, and on 04/01/24 patient and wife opted for comfort measures and discharged [facility] after 1 more HD treatment and paracentesis to allow time for gathering family. Unable to perform paracentesis due to blood pressure being too low.  Awaiting for hospice eval and transition to hospice facility. 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, recurrent paracentesis-dialysis dependent Hyperkalemia Metabolic acidosis Uremic encephalopathy Hyponatremia Decompensated cirrhosis with large volume ascites needing recurrent paracentesis Anemia of renal disease Acute hypoxic respiratory failure due to fluid overload Severe sepsis due to possible SBP/leukocytosis Hypotension History of hypertension Paroxysmal A-fib-off Eliquis  and metoprolol  Chronic diastolic CHF EF 50-50% G2 DD OSA COPD GOLD III Diarrhea Morbid obesity Pressure injury of skin Protein-calorie malnutrition, severe Severe hypoalbuminemia End-of-life care:  Patient has decompensated liver cirrhosis with fluid overload hypertension acute renal failure setting of HRS hypovolemia, with severe malnutrition and other complex medical comorbidities.Overall prognosis poor palliative care following and at this time plan for [facility] and comfort measures  DVT prophylaxis: NONE Code Status:   Code Status: Do not attempt resuscitation (DNR) - Comfort care Family Communication: plan of care discussed with patient/family at bedside. Patient status is: Remains hospitalized because of severity of illness Level of care: Progressive   Dispo: The patient is from: HOME            Anticipated disposition: [facility] Objective: Vitals last 24 hrs: Vitals:   04/02/24 0815 04/02/24 0902 04/02/24 0947 04/02/24 1148  BP:  (!) 75/46 93/69 96/76   Pulse: (!) 102  97   Resp: 19  (!) 27   Temp:   (!) 97  F (36.1 C) (!) 97.3 F (36.3 C)  TempSrc:   Oral Oral  SpO2: 94%  97%   Weight:      Height:        Physical Examination: General exam: alert awake, oriented, older  than stated age HEENT:Oral mucosa moist, Ear/Nose WNL grossly Respiratory system: Bilaterally clear BS,no use of accessory muscle Cardiovascular system: S1 & S2 +, No JVD. Gastrointestinal system: Abdomen soft, distended mildly tender, BS+ Nervous System: Alert, awake, moving all extremities,and following commands. Extremities: extremities warm, leg edema mild Skin: No rashes,no icterus. MSK: Normal muscle bulk,tone, power   Medications reviewed:  Scheduled Meds:  feeding supplement (NEPRO CARB STEADY)  237 mL Oral BID BM   fluticasone  furoate-vilanterol  1 puff Inhalation Daily   leptospermum manuka honey  1 Application Topical Daily   pantoprazole   40 mg Oral BID AC   polyethylene glycol  17 g Oral Daily   polymixin-bacitracin    Topical Daily   Continuous Infusions: Diet: Diet Order             Diet regular Room service appropriate? Yes with Assist; Fluid consistency: Thin  Diet effective now                   Pressure Ulcer: Wound 03/22/24 1845 Pressure Injury Sacrum Medial Unstageable - Full thickness tissue loss in which the base of the injury is covered by slough (yellow, tan, gray, green or brown) and/or eschar (tan, brown or black) in the wound bed. (Active)     Wound 03/29/24 2030 Pressure Injury Coccyx Mid Stage 2 -  Partial thickness loss of dermis presenting as a shallow open injury with a red, pink wound bed without slough. (Active)   Discharge Instructions     Diet general   Complete by: As directed    Discharge instructions   Complete by: As directed    Please follow-up with [facility] upon discharge   Increase activity slowly   Complete by: As directed    No wound care   Complete by: As directed       Allergies as of 04/02/2024       Reactions   Amlodipine     Leg edema   Penicillins Hives        Medication List     STOP taking these medications    allopurinol  100 MG tablet Commonly known as: ZYLOPRIM    atorvastatin  40 MG  tablet Commonly known as: LIPITOR   colesevelam  625 MG tablet Commonly known as: WELCHOL    Eliquis  5 MG Tabs tablet Generic drug: apixaban    folic acid  1 MG tablet Commonly known as: FOLVITE    ibuprofen 200 MG tablet Commonly known as: ADVIL   isosorbide  dinitrate 30 MG tablet Commonly known as: ISORDIL    Magnesium 400 MG Tabs   Metanx 3-90.314-2-35 MG Caps   metoprolol  tartrate 25 MG tablet Commonly known as: LOPRESSOR    multivitamin with minerals Tabs tablet   thiamine  100 MG tablet Commonly known as: Vitamin B-1   Vitamin D 50 MCG (2000 UT) tablet       TAKE these medications    azelastine  0.1 % nasal spray Commonly known as: ASTELIN  Place 2 sprays into both nostrils 2 (two) times daily. Use in each nostril as directed   Dulera  100-5 MCG/ACT Aero Generic drug: mometasone -formoterol  USE 2 INHALATIONS TWICE A DAY What changed: See the new instructions.   fluticasone  50 MCG/ACT nasal spray Commonly known as: FLONASE  PLACE 2 SPRAYS INTO BOTH NOSTRILS  DAILY. USE FOR 6-8 WEEKS   furosemide  40 MG tablet Commonly known as: LASIX  Take 40 mg by mouth daily as needed for edema.   omeprazole  20 MG capsule Commonly known as: PRILOSEC Take 20 mg by mouth 2 (two) times daily before a meal.   spironolactone  50 MG tablet Commonly known as: ALDACTONE  Take 50 mg by mouth daily.   SYSTANE OP Place 1 drop into both eyes 3 (three) times daily as needed (dry eyes).        Contact information for after-discharge care     Destination     [facility], [place] .   Service: Skilled Nursing Contact information: [address] [place]  [address] [phone]                    Allergies  Allergen Reactions   Amlodipine      Leg edema   Penicillins Hives    The results of significant diagnostics from this hospitalization (including imaging, microbiology, ancillary and laboratory) are listed below for reference.     Microbiology: No results found for this or any previous visit (from the past 240 hours).  Procedures/Studies: IR Paracentesis Result Date: 03/26/2024 INDICATION: 83 year old male. History of cirrhosis with recurrent ascites. Request is for therapeutic paracentesis. EXAM: ULTRASOUND GUIDED THERAPEUTIC LEFT-SIDED PARACENTESIS MEDICATIONS: Lidocaine  1% 10 mL COMPLICATIONS: None immediate. PROCEDURE: Informed written consent was obtained from the patient after a discussion of the risks, benefits and alternatives to treatment. A timeout was performed prior to the initiation of the procedure. Initial ultrasound scanning demonstrates a moderate amount of ascites within the left lower abdominal quadrant. The left lower abdomen was prepped and draped in the usual sterile fashion. 1% lidocaine  was used for local anesthesia. Following this, a 19 gauge, 7-cm, Yueh catheter was introduced. An ultrasound image was saved for documentation purposes. The paracentesis was performed. The catheter was removed and a dressing was applied. The patient tolerated the procedure well without immediate post procedural complication. FINDINGS: A total of approximately 4.1 L of straw-colored fluid was removed. IMPRESSION: Successful ultrasound-guided therapeutic paracentesis yielding 4.1 L liters of peritoneal fluid. Performed by Delon Beagle NP PLAN: The patient has required >/=2 paracenteses in a 30 day period and a formal evaluation by the [facility] Interventional Radiology Portal Hypertension Clinic has been arranged. Thom Hall, MD [facility] Radiology Electronically Signed   By: Thom Hall M.D.   On: 03/26/2024 13:02   US  RENAL Result Date: 03/22/2024 EXAM: US  Retroperitoneum Complete, Renal. CLINICAL HISTORY: 409830 AKI (acute kidney injury) (HCC) 409830. AKI (acute kidney injury) (HCC) TECHNIQUE: Real-time ultrasound of the retroperitoneum (complete) with image  documentation. COMPARISON: None provided. FINDINGS: RIGHT KIDNEY: Measures 11.9 x 6.6 x 7.7 cm (319.4 cc). 7.2 cm upper pole right renal cyst. No hydronephrosis or mass visualized. LEFT KIDNEY: Not well visualized. BLADDER: Unremarkable as visualized. OTHER FINDINGS: Small - moderate ascites. IMPRESSION: 1. Right upper pole renal cyst measuring 7.2 cm. 2. Small to moderate ascites. Electronically signed by: Dayne Hassell MD 03/22/2024 03:35 PM EDT RP Workstation: [id number]   DG Chest Port 1 View Result Date: 03/22/2024 EXAM: 1 VIEW XRAY OF THE CHEST 03/22/2024 01:07:00 PM COMPARISON: 03/22/2024 CLINICAL HISTORY: Encounter for central line placement FINDINGS: LINES, TUBES AND DEVICES: Right IJ CVC in place with tip in mid SVC. LUNGS AND PLEURA: No focal pulmonary opacity. No pulmonary edema. No pleural effusion. No pneumothorax. HEART AND MEDIASTINUM: No acute abnormality of the cardiac and mediastinal  silhouettes. Aortic calcification. BONES AND SOFT TISSUES: No acute osseous abnormality. IMPRESSION: 1. Right IJ CVC in place with tip in mid SVC. No pneumothorax after catheter placement. Electronically signed by: Waddell Calk MD 03/22/2024 01:36 PM EDT RP Workstation: [id number]   CT Head Wo Contrast Result Date: 03/22/2024 CLINICAL DATA:  83 year old male with altered mental status, declining mentally over the past week. EXAM: CT HEAD WITHOUT CONTRAST TECHNIQUE: Contiguous axial images were obtained from the base of the skull through the vertex without intravenous contrast. RADIATION DOSE REDUCTION: This exam was performed according to the departmental dose-optimization program which includes automated exposure control, adjustment of the mA and/or kV according to patient size and/or use of iterative reconstruction technique. COMPARISON:  Paranasal sinus CT 11/29/2016. FINDINGS: Brain: Cerebral volume is within normal limits for age. No midline shift, ventriculomegaly, mass effect, evidence of mass lesion,  intracranial hemorrhage or evidence of cortically based acute infarction. Gray-white differentiation within normal limits for age. Vascular: Calcified atherosclerosis at the skull base. No suspicious intracranial vascular hyperdensity. Skull: Intact, negative. Sinuses/Orbits: Visualized paranasal sinuses and mastoids are clear. Other: No acute orbit or scalp soft tissue finding. IMPRESSION: Normal for age noncontrast Head CT. Electronically Signed   By: VEAR Hurst M.D.   On: 03/22/2024 11:56   DG Chest Portable 1 View Result Date: 03/22/2024 EXAM: 1 VIEW XRAY OF THE CHEST 03/22/2024 09:29:00 AM COMPARISON: 06/15/2022 CLINICAL HISTORY: Infection evaluation - aspiration. Pt BIB [facility] from home d/t [facility]. Baseline A\T\O X4. Since Monday, pt started slowly declining mentally and physically. Pt is usually able to walk around w a walker, but is no longer to do so. Hx liver failure and diabetes. BP 98/62; HR 80S; SPO2 95% RA; CBG 152. FINDINGS: LUNGS AND PLEURA: No focal pulmonary opacity. No pulmonary edema. No pleural effusion. No pneumothorax. HEART AND MEDIASTINUM: No acute abnormality of the cardiac and mediastinal silhouettes. Mildly elevated left hemidiaphragm. BONES AND SOFT TISSUES: No acute osseous abnormality. IMPRESSION: 1. No acute findings. 2. Mildly elevated left hemidiaphragm. Electronically signed by: Waddell Calk MD 03/22/2024 09:55 AM EDT RP Workstation: [id number]   IR Paracentesis Result Date: 03/13/2024 INDICATION: therapeutic paracentesis Cirrhosis with recurrent ascites. Request for therapeutic paracentesis. Most recent paracentesis 02/11/24 with 4.9 L out. EXAM: ULTRASOUND GUIDED THERAPEUTIC PARACENTESIS MEDICATIONS: 10 mL 1% lidocaine  COMPLICATIONS: None immediate. PROCEDURE: Informed written consent was obtained from the patient after a discussion of the risks, benefits and alternatives to treatment. A timeout was performed prior to the initiation of the procedure. Initial ultrasound scanning  demonstrates a large amount of ascites within the left lower abdominal quadrant. The left lower abdomen was prepped and draped in the usual sterile fashion. 1% lidocaine  was used for local anesthesia. Following this, a 19 gauge, 7-cm, Yueh catheter was introduced. An ultrasound image was saved for documentation purposes. The paracentesis was performed. The catheter was removed and a dressing was applied. The patient tolerated the procedure well without immediate post procedural complication. Patient received post-procedure intravenous albumin ; see nursing notes for details. FINDINGS: A total of approximately 5.3 L of yellow fluid was removed. IMPRESSION: Successful ultrasound-guided paracentesis yielding 5.3 L of peritoneal fluid. Performed by Laymon Coast, NP PLAN: If the patient eventually requires >/=2 paracenteses in a 30 day period, candidacy for formal evaluation by the [facility] Interventional Radiology Portal Hypertension Clinic will be assessed. Thom Hall, MD [facility] Radiology Electronically Signed   By: Thom Hall M.D.   On: 03/13/2024 15:38    Labs:  BNP (last 3 results) No results for input(s): BNP in the last 8760 hours. Basic Metabolic Panel: Recent Labs  Lab 03/29/24 0501 03/30/24 0322 03/31/24 0441 04/01/24 0432 04/02/24 0509  NA 132* 132* 131* 131* 133*  K 4.4 4.0 4.3 4.7 4.1  CL 98 97* 96* 95* 97*  CO2 20* 22 23 20* 23  GLUCOSE 103* 108* 128* 126* 98  BUN 65* 36* 49* 61* 48*  CREATININE 3.17* 2.36* 3.41* 4.26* 3.94*  CALCIUM  7.9* 7.8* 7.8* 7.9* 8.0*  PHOS 4.7* 4.2 5.9* 6.8* 5.6*   Liver Function Tests: Recent Labs  Lab 03/27/24 0425 03/28/24 0839 03/29/24 0501 03/30/24 0322 03/31/24 0441 04/01/24 0432 04/02/24 0509  AST 28  --   --   --   --   --   --   ALT 16  --   --   --   --   --   --   ALKPHOS 123  --   --   --   --   --   --   BILITOT 0.7  --   --   --   --   --   --   PROT 5.4*  --   --    --   --   --   --   ALBUMIN  2.1*   < > 1.8* 1.7* 1.6* 1.6* 1.6*   < > = values in this interval not displayed.   No results for input(s): LIPASE, AMYLASE in the last 168 hours. No results for input(s): AMMONIA in the last 168 hours. CBC: Recent Labs  Lab 03/27/24 0425 03/29/24 0501 03/30/24 0322 03/31/24 0441 04/01/24 0432  WBC 10.3 15.2* 15.6* 16.5* 16.8*  NEUTROABS 8.9*  --   --   --   --   HGB 9.5* 9.6* 10.2* 9.6* 9.9*  HCT 30.4* 31.5* 32.4* 31.1* 31.9*  MCV 89.1 88.2 87.3 87.9 89.4  PLT 379 384 364 402* 378   CBG: Recent Labs  Lab 04/01/24 0647 04/01/24 1148 04/01/24 1758 04/01/24 2320 04/02/24 0544  GLUCAP 132* 213* 100* 125* 97   No results for input(s): TSH, T4TOTAL, T3FREE, THYROIDAB in the last 72 hours.  Invalid input(s): FREET3 Urinalysis    Component Value Date/Time   COLORURINE YELLOW 03/23/2024 0320   APPEARANCEUR HAZY (A) 03/23/2024 0320   LABSPEC 1.015 03/23/2024 0320   PHURINE 5.0 03/23/2024 0320   GLUCOSEU NEGATIVE 03/23/2024 0320   HGBUR NEGATIVE 03/23/2024 0320   BILIRUBINUR NEGATIVE 03/23/2024 0320   KETONESUR NEGATIVE 03/23/2024 0320   PROTEINUR NEGATIVE 03/23/2024 0320   UROBILINOGEN 0.2 09/07/2008 1037   NITRITE NEGATIVE 03/23/2024 0320   LEUKOCYTESUR NEGATIVE 03/23/2024 0320   Sepsis Labs Recent Labs  Lab 03/29/24 0501 03/30/24 0322 03/31/24 0441 04/01/24 0432  WBC 15.2* 15.6* 16.5* 16.8*   Microbiology No results found for this or any previous visit (from the past 240 hours).  Time coordinating discharge: 35 minutes  SIGNED: Mennie LAMY, MD  [facility] 04/02/2024, 3:40 PM  If 7PM-7AM, please contact night-coverage [email or web address]

## 2024-04-02 NOTE — Hospital Course (Addendum)
 Darren [name] is a 83 y.o. male with PMH of  hypertension, CKD, atrial fibrillation, anemia, diastolic CHF, OSA, obesity, COPD, ongoing alcohol  abuse, cirrhosis with ascites presented with worsening mental status for 5 days prior to coming followed by difficulty ambulating. Patient is a retired Acupuncturist and very sharp at baseline per family. Has history of cirrhosis secondary to alcohol  abuse and has been getting paracentesis.Last paracentesis was week PTA 9/4 and had 5.3 L removed. Sen by nephrologist a day prior, per chart review and there was concern about his worsening renal function and he was started on increased dose of spironolactone  with increased frequency of Lasix .  In ZI:tnmdzwpwh symptoms and admitted. Since admission he has undergone paracenteses as well as ongoing dialysis. Changes in mental status deemed likely secondary to uremic encephalopathy.Slowly improving from mental status Perative care has been following, and on 04/01/24 patient and wife opted for comfort measures and discharged to [facility] after 1 more HD treatment and paracentesis to allow time for gathering family. Unable to perform paracentesis due to blood pressure being too low.  Awaiting for hospice eval and transition to hospice facility. 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, recurrent paracentesis-dialysis dependent Hyperkalemia Metabolic acidosis Uremic encephalopathy Hyponatremia Decompensated cirrhosis with large volume ascites needing recurrent paracentesis Anemia of renal disease Acute hypoxic respiratory failure due to fluid overload Severe sepsis due to possible SBP/leukocytosis Hypotension History of  hypertension Paroxysmal A-fib-off Eliquis  and metoprolol  Chronic diastolic CHF EF 50-50% G2 DD OSA COPD GOLD III Diarrhea Morbid obesity Pressure injury of skin Protein-calorie malnutrition, severe Severe hypoalbuminemia End-of-life care:  Patient has decompensated liver cirrhosis with fluid overload hypertension acute renal failure setting of HRS hypovolemia, with severe malnutrition and other complex medical comorbidities.Overall prognosis poor palliative care following and at this time plan for [facility] and comfort measures  DVT prophylaxis: NONE Code Status:   Code Status: Do not attempt resuscitation (DNR) - Comfort care Family Communication: plan of care discussed with patient/family at bedside. Patient status is: Remains hospitalized because of severity of illness Level of care: Progressive   Dispo: The patient is from: HOME            Anticipated disposition: [facility] Objective: Vitals last 24 hrs: Vitals:   04/02/24 0815 04/02/24 0902 04/02/24 0947 04/02/24 1148  BP:  (!) 75/46 93/69 96/76   Pulse: (!) 102  97   Resp: 19  (!) 27   Temp:   (!) 97 F (36.1 C) (!) 97.3 F (36.3 C)  TempSrc:   Oral Oral  SpO2: 94%  97%   Weight:      Height:        Physical Examination: General exam: alert awake, oriented, older than stated age HEENT:Oral mucosa moist, Ear/Nose WNL grossly Respiratory system: Bilaterally clear BS,no use of accessory muscle Cardiovascular system: S1 & S2 +, No JVD. Gastrointestinal system: Abdomen soft, distended mildly tender, BS+ Nervous System: Alert, awake, moving all extremities,and following commands. Extremities: extremities warm, leg edema mild Skin: No rashes,no icterus. MSK: Normal muscle bulk,tone, power   Medications reviewed:  Scheduled Meds:  feeding supplement (NEPRO CARB STEADY)  237 mL Oral BID BM   fluticasone  furoate-vilanterol  1 puff Inhalation Daily  leptospermum manuka honey  1 Application Topical Daily    pantoprazole   40 mg Oral BID AC   polyethylene glycol  17 g Oral Daily   polymixin-bacitracin    Topical Daily   Continuous Infusions: Diet: Diet Order             Diet regular Room service appropriate? Yes with Assist; Fluid consistency: Thin  Diet effective now

## 2024-04-02 NOTE — TOC Progression Note (Addendum)
 Transition of Care [facility]) - Progression Note    Patient Details  Name: Darren [name] MRN: [id number] Date of Birth: 08-Jul-1941  [facility]) CM/SW Contact  Isaiah Public, LCSWA Phone Number: 04/02/2024, 3:03 PM  Clinical Narrative:     Amy with [facility] informed team that patient has been approved for [facility] and that they have a bed today for patient. Amy with [facility] plans to follow up with CSW once consents have been completed.  Expected Discharge Plan: Skilled Nursing Facility Barriers to Discharge: Continued Medical Work up               Expected Discharge Plan and Services In-house Referral: Clinical Social Work     Living arrangements for the past 2 months: Single Family Home                                       Social Drivers of Health (SDOH) Interventions SDOH Screenings   Food Insecurity: No Food Insecurity (03/22/2024)  Housing: Low Risk  (03/22/2024)  Transportation Needs: No Transportation Needs (03/22/2024)  Utilities: Not At Risk (03/22/2024)  Social Connections: Patient Unable To Answer (03/22/2024)  Tobacco Use: Medium Risk (03/23/2024)    Readmission Risk Interventions     No data to display

## 2024-04-04 ENCOUNTER — Telehealth: Payer: Self-pay

## 2024-04-04 NOTE — Telephone Encounter (Signed)
 Copied from CRM [id number]. Topic: Appointments - Appointment Cancel/Reschedule >> Apr 03, 2024  2:33 PM Chantha C wrote: Patient/patient representative is calling to cancel or reschedule an appointment. Refer to attachments for appointment information.  Patient's spouse Darren [name] [phone] states patient is at [facility] house and is at his end of his life and needs to cancel 04/21/24 at 11 am with NP, [facility] appointment or any other follow up with the practice. >> Apr 03, 2024  2:34 PM Leila C wrote: Darren [name] for the providers to know per patient's wife Darren [name].   FYI Only.

## 2024-04-09 DEATH — deceased

## 2024-04-21 ENCOUNTER — Ambulatory Visit: Admitting: Primary Care
